# Patient Record
Sex: Female | Born: 1945 | Race: White | Hispanic: No | Marital: Married | Smoking: Never smoker
Health system: Southern US, Community
[De-identification: ages and names within clinical notes are randomized; demographics above are authoritative.]

## PROBLEM LIST (undated history)

## (undated) NOTE — *Deleted (*Deleted)
[facility]  [address] [place], [place] [address] Phone: [phone]  Fax: [phone]   Clinic Day:  04/20/2020  Referring physician: Ronal Fear, NP  Chief Complaint: Mariah Thornton is a 59 y.o. female with metastatic clear cell renal cell carcinoma seen for 1 day assessment.  HPI:  The patient was last seen in the medical oncology clinic on 04/20/2020. At that time, she was groggy after taking hydrocodone/acetaminophen 5/325 every 6 hours. Blood pressure was high (206/166; repeat 181/74).  She has lower extremity edema.  Hematocrit was 30.5, hemoglobin 9.2, MCV 82.9, platelets 142,000, WBC 8,100. Sodium was 129. Potassium was 5.5.  Calcium was 7.8 with an albumin of 2.0 (corrected calcium. AST was 56.  She was referred to the [facility] ER.    CXR revealed pulmonary metastatic disease re-demonstrated with new small pleural effusions since 02/2020.  Bilateral lower extremity duplex revealed no DVT.  Head CT without contrast revealed chronic atrophic and ischemic changes without acute abnormality.  There was stable soft tissue scalp lesion near the vertex on the left.  A smaller nodule was noted in the midline anteriorly.  During the interim, ***   No past medical history on file.  Past Surgical History:  Procedure Laterality Date  . PORTA CATH INSERTION N/A 03/28/2020   Procedure: PORTA CATH INSERTION;  Surgeon: Annice Needy, MD;  Location: [facility] INVASIVE CV LAB;  Service: Cardiovascular;  Laterality: N/A;    No family history on file.  Social History:  reports that she has never smoked. She has never used smokeless tobacco. She reports current alcohol use of about 1.0 standard drink of alcohol per week. She reports previous drug use. reports that she has never smoked. She has never used smokeless tobacco. She reports previous alcohol use. She reports previous drug use.  The patient denies any exposure to radiation or toxins.  She states that she is  retired.  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  Her husband's name is Chanetta Marshall.  The patient lives in [place], [place]. The patient is accompanied by her friend Isabel Caprice*** (in person) today.  Allergies: No Known Allergies  Current Medications: Current Outpatient Medications  Medication Sig Dispense Refill  . axitinib (INLYTA) 5 MG tablet Take 1 tablet (5 mg total) by mouth 2 (two) times daily. 60 tablet 0  . HYDROcodone-acetaminophen (NORCO/VICODIN) 5-325 MG tablet Take 1/2 or 1 pill every 6 hours as needed for pain. 30 tablet 0  . hydrOXYzine (ATARAX/VISTARIL) 25 MG tablet Take 25 mg by mouth every 8 (eight) hours as needed.    . lidocaine-prilocaine (EMLA) cream Apply to affected area once (Patient not taking: Reported on 04/04/2020) 30 g 3  . ondansetron (ZOFRAN) 8 MG tablet Take 1 tablet (8 mg total) by mouth 2 (two) times daily as needed (Nausea or vomiting). (Patient not taking: Reported on 04/04/2020) 30 tablet 1   No current facility-administered medications for this visit.    Review of Systems  Constitutional: Negative for chills, diaphoresis, fever, malaise/fatigue and weight loss (stable).       Feels "groggy" from oxycodone.  HENT: Negative for congestion, ear discharge, ear pain, hearing loss, nosebleeds, sinus pain, sore throat and tinnitus.        Sinus problems.  Eyes: Positive for photophobia. Negative for blurred vision and double vision.       "Squinting all the time."  Respiratory: Negative for cough, hemoptysis, sputum production and shortness of breath.  Cardiovascular: Negative for chest pain, palpitations and leg swelling.  Gastrointestinal: Positive for constipation (mild). Negative for abdominal pain, blood in stool, diarrhea, heartburn, melena, nausea and vomiting.       Drinks Ensure. Poor appetite. Drinks lots of water and tea.  Genitourinary: Negative for dysuria, frequency, hematuria and urgency.  Musculoskeletal: Positive for back pain.  Negative for joint pain, myalgias and neck pain.       Pain in her pubic area.  Skin: Negative for itching and rash.  Neurological: Negative for dizziness, tingling, sensory change, weakness and headaches.  Endo/Heme/Allergies: Does not bruise/bleed easily.  Psychiatric/Behavioral: Negative for depression and memory loss. The patient is not nervous/anxious and does not have insomnia.   All other systems reviewed and are negative.  Performance status (ECOG): 2***  Vitals There were no vitals taken for this visit.  Physical Exam Vitals and nursing note reviewed.  Constitutional:      Comments: Patient sitting in a wheelchair in no acute distress. She was examined in the wheelchair.  HENT:     Head: Normocephalic and atraumatic.     Mouth/Throat:     Mouth: Mucous membranes are moist.     Pharynx: Oropharynx is clear.  Eyes:     General: No scleral icterus.    Extraocular Movements: Extraocular movements intact.     Conjunctiva/sclera: Conjunctivae normal.     Pupils: Pupils are equal, round, and reactive to light.  Neck:     Comments: 7 cm x 4.5 cm mass on base of posterior neck Cardiovascular:     Rate and Rhythm: Normal rate and regular rhythm.     Heart sounds: Normal heart sounds. No murmur heard.   Pulmonary:     Effort: Pulmonary effort is normal. No respiratory distress.     Breath sounds: Normal breath sounds. No wheezing or rales.  Abdominal:     General: Bowel sounds are normal.     Palpations: There is no mass.     Tenderness: There is no abdominal tenderness.  Musculoskeletal:        General: Tenderness (BLE) present. No swelling. Normal range of motion.     Cervical back: Normal range of motion and neck supple.     Right lower leg: Edema present.     Left lower leg: Edema present.  Skin:    General: Skin is warm and dry.  Neurological:     Mental Status: She is alert and oriented to person, place, and time. Mental status is at baseline.  Psychiatric:         Behavior: Behavior normal.        Thought Content: Thought content normal.        Judgment: Judgment normal.      03/16/2020     04/04/2020    Appointment on 04/20/2020  Component Date Value Ref Range Status  . Sodium 04/20/2020 129* 135 - 145 mmol/L Final  . Potassium 04/20/2020 5.5* 3.5 - 5.1 mmol/L Final  . Chloride 04/20/2020 101  98 - 111 mmol/L Final  . CO2 04/20/2020 20* 22 - 32 mmol/L Final  . Glucose, Bld 04/20/2020 137* 70 - 99 mg/dL Final   Glucose reference range applies only to samples taken after fasting for at least 8 hours.  . BUN 04/20/2020 22  8 - 23 mg/dL Final  . Creatinine, Ser 04/20/2020 0.91  0.44 - 1.00 mg/dL Final  . Calcium 40/98/1191 7.8* 8.9 - 10.3 mg/dL Final  . Total Protein 04/20/2020 6.7  6.5 -  8.1 g/dL Final  . Albumin 16/04/9603 2.0* 3.5 - 5.0 g/dL Final  . AST 54/03/8118 56* 15 - 41 U/L Final  . ALT 04/20/2020 12  0 - 44 U/L Final  . Alkaline Phosphatase 04/20/2020 118  38 - 126 U/L Final  . Total Bilirubin 04/20/2020 0.8  0.3 - 1.2 mg/dL Final  . GFR, Estimated 04/20/2020 >60  >60 mL/min Final  . Anion gap 04/20/2020 8  5 - 15 Final   Performed at [facility], [address], [place], [place] [address]  . WBC 04/20/2020 8.1  4.0 - 10.5 K/uL Final  . RBC 04/20/2020 3.68* 3.87 - 5.11 MIL/uL Final  . Hemoglobin 04/20/2020 9.2* 12.0 - 15.0 g/dL Final  . HCT 95/62/1308 30.5* 36 - 46 % Final  . MCV 04/20/2020 82.9  80.0 - 100.0 fL Final  . MCH 04/20/2020 25.0* 26.0 - 34.0 pg Final  . MCHC 04/20/2020 30.2  30.0 - 36.0 g/dL Final  . RDW 65/78/4696 18.6* 11.5 - 15.5 % Final  . Platelets 04/20/2020 142* 150 - 400 K/uL Final  . nRBC 04/20/2020 0.0  0.0 - 0.2 % Final  . Neutrophils Relative % 04/20/2020 70  % Final  . Neutro Abs 04/20/2020 5.7  1.7 - 7.7 K/uL Final  . Lymphocytes Relative 04/20/2020 12  % Final  . Lymphs Abs 04/20/2020 1.0  0.7 - 4.0 K/uL Final  . Monocytes Relative 04/20/2020 11  % Final  . Monocytes Absolute  04/20/2020 0.9  0.1 - 1.0 K/uL Final  . Eosinophils Relative 04/20/2020 3  % Final  . Eosinophils Absolute 04/20/2020 0.3  0.0 - 0.5 K/uL Final  . Basophils Relative 04/20/2020 1  % Final  . Basophils Absolute 04/20/2020 0.0  0.0 - 0.1 K/uL Final  . Immature Granulocytes 04/20/2020 3  % Final  . Abs Immature Granulocytes 04/20/2020 0.21* 0.00 - 0.07 K/uL Final   Performed at [facility], [address] [place], [place] [address]    Assessment:  DANYAH GUASTELLA is a 70 y.o. female with metastatic renal cell carcinoma s/p CT guided biopsy of a 5.6 x 4.0 cm soft tissue mass in the posterior neck on 03/08/2020.  Pathology revealed metastatic carcinoma with clear cell and oncocytic features.  Cytokeratin 7 was negative, cytokeratin 20, focally positive, PAX 8 positive, GATA3 negative and p63 negative.  Based on the pattern of immunoreactivity, metastatic carcinoma is compatible with metastatic renal cell carcinoma.  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 She presented with hypercalcemia.   LDH was 245 on 03/05/2020.  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  There was a possible lytic lesion within the L2 vertebral body.  Abdomen and pelvis CT on 03/04/2020 revealed an 8.8 x 8.5 x 7.5 cm heterogeneous mass arising from the anterior right kidney.  There were numerous and large retroperitoneal lymph nodes.  There are multiple lytic osseous lesions involving the L2 and L3 vertebral bodies with clear cortical and medullary destruction.  There are multiple additional subtle osseous lucencies throughout the spine and pelvis were suspicious for subtle metastatic disease.  There is a small right and trace left pleural effusion and multiple bilateral pulmonary nodules.  There was stigmata of cirrhosis and splenomegaly with trace ascites.   Bone scan on 04/05/2020 revealed foci of abnormal tracer uptake at the inferior RIGHT scapula, L1 and L2 vertebral bodies, and distal  LEFT femoral metaphysis c/w osseous metastases.  Left femur films on 04/07/2020 revealed no suspicious lytic or sclerotic osseous abnormality in the metaphysis of the distal left femur .  She is day 15 of cycle #1 pembrolizumab (began 04/07/2020).  She began axitinib on 04/15/2020.  She has a history of hypercalcemia.   She received Zometa on 03/04/2020 and 04/04/2020.  Echo on 04/06/2020 revealed an EF of 70-75%.  She has folate deficiency.  Folate was 4.8 on 03/06/2020.  She is on oral folic acid.  B12 was 2351 on 03/06/2020.  She received the Pfizer COVID-19 vaccine on 11/11/2019 and 12/08/2019.  Symptomatically, ***  Plan: 1.Labs today:  ***   2.  Metastatic clear cell renal cell carcinoma She has a large right renal mass, multiple lytic bone lesions, and a mass on her posterior neck.             She is in a poor prognostic group based on [facility] criteria (metastatic disease, KPS < 80%, hypercalcemia,elevated LDH,and anemia). She is day 1 of axitinib (Inlyta) and day 14 pembrolizumab.                         Blood pressure is elevated and likely secondary to axitinib.                                     Hold axitinib.                         She has hyperkalemia                                     Etiology likely secondary to axitinib.             Clinically, her tumor is smaller. 3. Hypercalcemia Calcium is 7.8 (corrected 9.5).             She has had hypercalcemia associated with malignancy. She receives Zometa monthly (last 04/04/2020). 4. Normocytic anemia Hematocrit 30.5.  Hemoglobin 9.2.  MCV 82.9.             Etiology likely secondary to renal cell carcinoma. Patient denies any bleeding. Ferritin 660 with an iron saturation of 11% and a TIBC of 150.  Folate was 4.8 (low) with a B12 of 2351.                         She is on folic acid.             Continue to monitor. 5. Cancer-related pain             She has back pain and pain in the pubic bone.                         Lumbar spine MRI is scheduled for 04/22/2020.             Pain is well managed with hydrocodone/acetamonophen, but makes her extremely drowsy.                         She has used 1 tablet instead of 1/2 tablet.  Discontinue hydrocodone/acetamonophen.             Discuss trial of Tramadol 1/2 tablet (25 mg) every 6 hours prn pain.                         Rx sent in today.             She may be a candidate for future local radiation. 6.   Hypertension             Initial BP 206/166; repeat 181/74.             Etiology secondary to axitinib.             Discuss management in the ER. 7.   Bilateral lower extremity edema             Discuss bilateral lower extremity duplex to r/o DVT.             Consider Lasix for diuresis. 8.  Hyperkalemia             Patient not on supplemental potassium.             Axitinib can cause hyperkalemia (15% incidence).             Consider Lasix as above. 9.   Patient to [facility] ER for hypertension, lower extremity edema and hyperkalemia. 10.   RTC tomorrow for MD assessment and labs (BMP).  I discussed the assessment and treatment plan with the patient.  The patient was provided an opportunity to ask questions and all were answered.  The patient agreed with the plan and demonstrated an understanding of the instructions.  The patient was advised to call back if the symptoms worsen or if the condition fails to improve as anticipated.  I provided *** minutes of face-to-face time during this this encounter and > 50% was spent counseling as documented under my assessment and plan.  Melissa C. Merlene Pulling, MD, PhD    04/20/2020, 5:01 PM  I, Danella Penton Tufford, am acting as Neurosurgeon for [employer]. Merlene Pulling, MD,  PhD.  I, Melissa C. Merlene Pulling, MD, have reviewed the above documentation for accuracy and completeness, and I agree with the above.

---

## 2019-11-11 ENCOUNTER — Ambulatory Visit: Payer: Self-pay | Attending: Internal Medicine

## 2019-11-11 DIAGNOSIS — Z23 Encounter for immunization: Secondary | ICD-10-CM

## 2019-11-11 NOTE — Progress Notes (Signed)
   Covid-19 Vaccination Clinic  Name:  Mariah Thornton    MRN: [id number] DOB: Jan 26, 1946  11/11/2019  [name] was observed post Covid-19 immunization for 15 minutes without incident. She was provided with Vaccine Information Sheet and instruction to access the V-Safe system.   [name] was instructed to call 911 with any severe reactions post vaccine: Marland Kitchen Difficulty breathing  . Swelling of face and throat  . A fast heartbeat  . A bad rash all over body  . Dizziness and weakness   Immunizations Administered    Name Date Dose VIS Date Route   Pfizer COVID-19 Vaccine 11/11/2019 10:10 AM 0.3 mL 09/02/2018 Intramuscular   Manufacturer: [place]   Lot: [id number]   [place]: [id number]

## 2019-12-08 ENCOUNTER — Ambulatory Visit: Payer: Medicare Other | Attending: Internal Medicine

## 2019-12-08 DIAGNOSIS — Z23 Encounter for immunization: Secondary | ICD-10-CM

## 2019-12-08 NOTE — Progress Notes (Signed)
   Covid-19 Vaccination Clinic  Name:  Mariah Thornton    MRN: [id number] DOB: Aug 01, 1945  12/08/2019  [name] was observed post Covid-19 immunization for 15 minutes without incident. She was provided with Vaccine Information Sheet and instruction to access the V-Safe system.   [name] was instructed to call 911 with any severe reactions post vaccine: Marland Kitchen Difficulty breathing  . Swelling of face and throat  . A fast heartbeat  . A bad rash all over body  . Dizziness and weakness   Immunizations Administered    Name Date Dose VIS Date Route   Pfizer COVID-19 Vaccine 12/08/2019  9:54 AM 0.3 mL 09/02/2018 Intramuscular   Manufacturer: [place]   Lot: [id number]   [place]: [id number]

## 2020-02-08 ENCOUNTER — Other Ambulatory Visit: Payer: Self-pay | Admitting: Urgent Care

## 2020-02-08 DIAGNOSIS — R221 Localized swelling, mass and lump, neck: Secondary | ICD-10-CM

## 2020-02-19 ENCOUNTER — Other Ambulatory Visit: Payer: Self-pay

## 2020-02-19 ENCOUNTER — Ambulatory Visit
Admission: RE | Admit: 2020-02-19 | Discharge: 2020-02-19 | Disposition: A | Discharge status: Home / Self Care | Payer: Medicare Other | Source: Ambulatory Visit | Attending: Urgent Care | Admitting: Urgent Care

## 2020-02-19 DIAGNOSIS — R221 Localized swelling, mass and lump, neck: Secondary | ICD-10-CM

## 2020-02-19 IMAGING — US US SOFT TISSUE HEAD/NECK
1 series · 14 of 22 positions shown · non-contrast
Comparison: None.

CLINICAL DATA: 74-year-old female with a history of neck lump

EXAM:
ULTRASOUND OF HEAD/NECK SOFT TISSUES
TECHNIQUE: Ultrasound examination of the head and neck soft tissues was
performed in the area of clinical concern.

[Series 1: us soft tissue head/neck · 0.08mm/px · 14 of 22 slices shown]
[im 1/22]
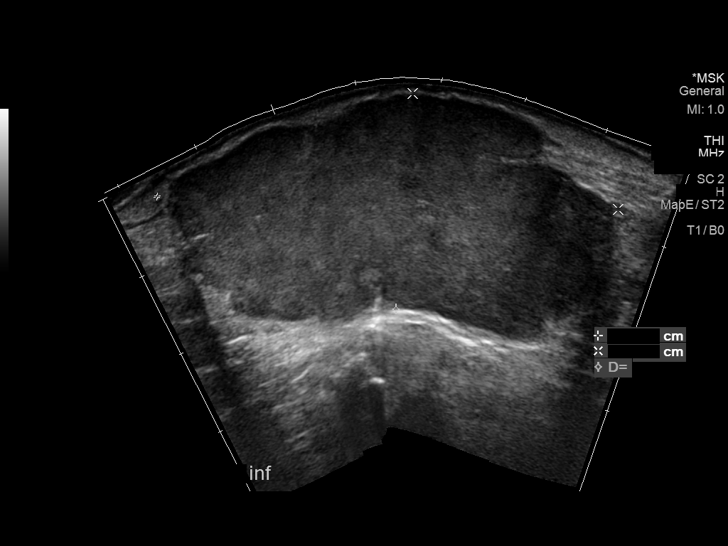
[im 3/22]
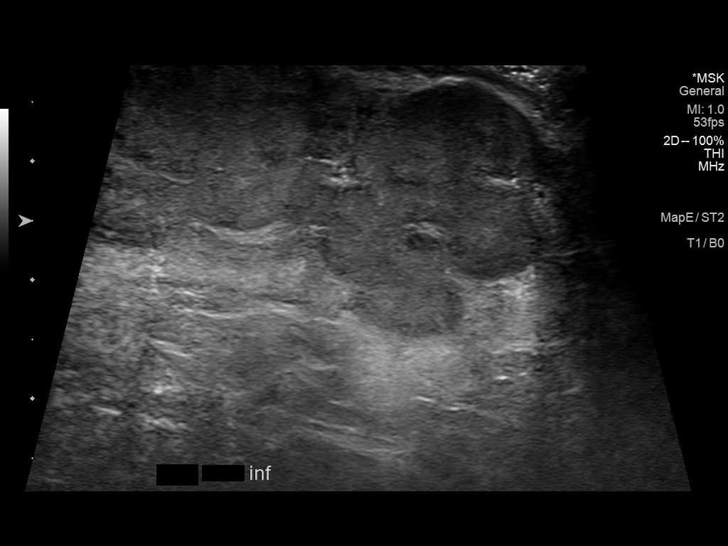
[im 4/22]
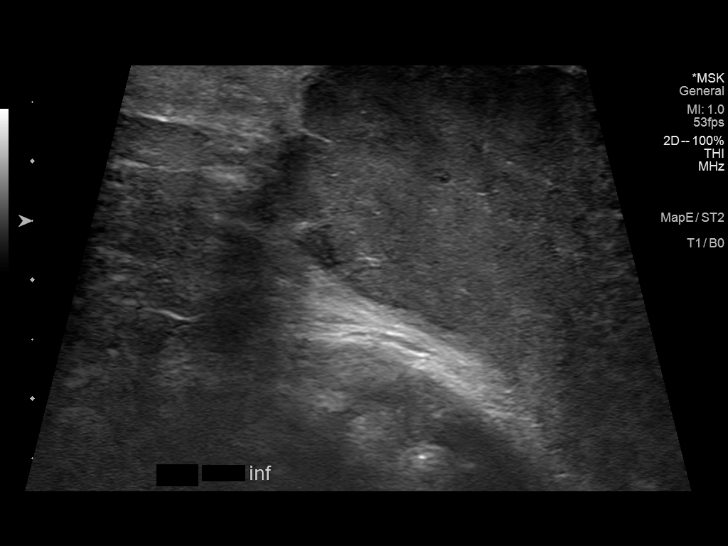
[im 6/22]
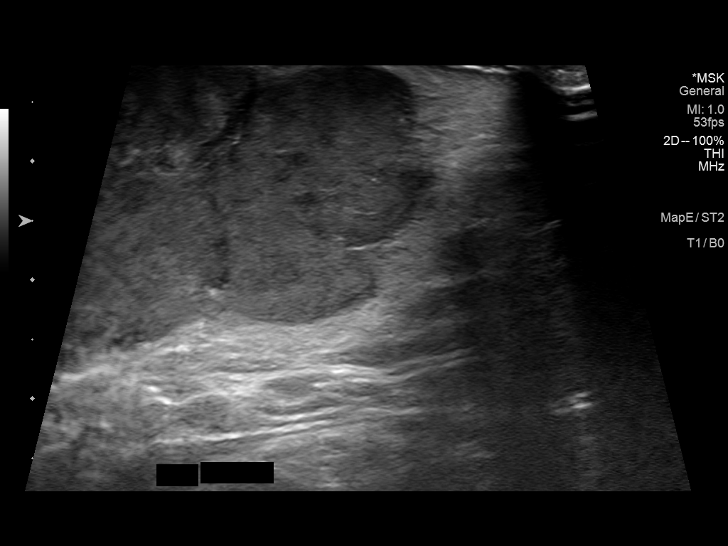
[im 8/22]
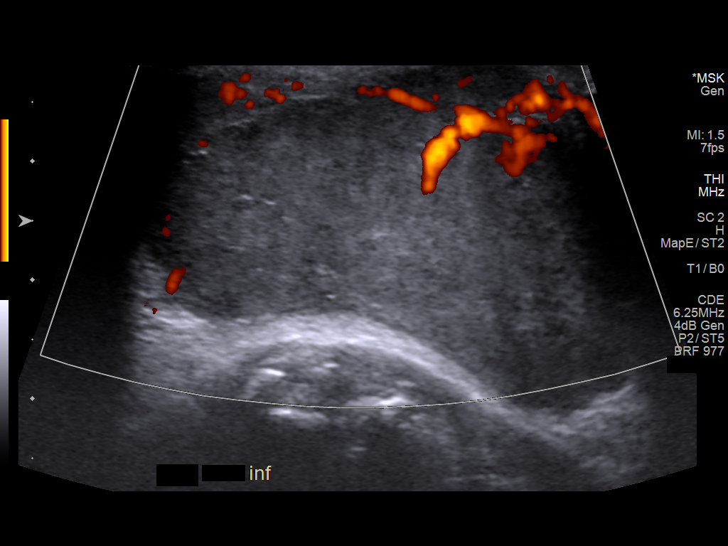
[im 9/22]
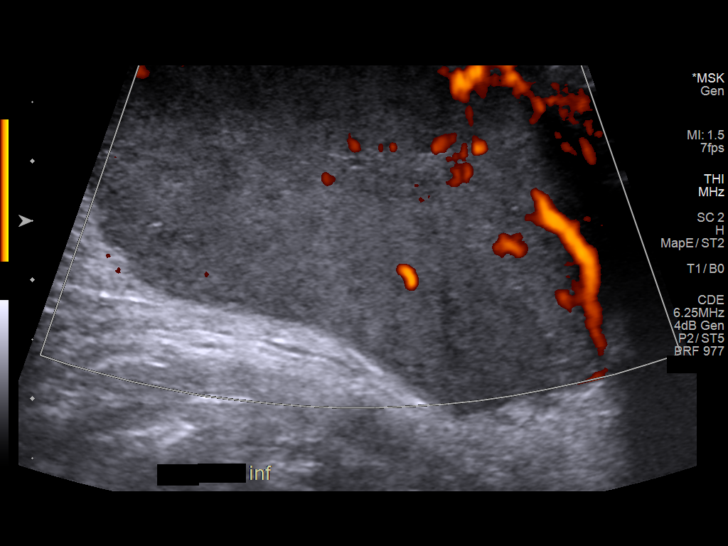
[im 11/22]
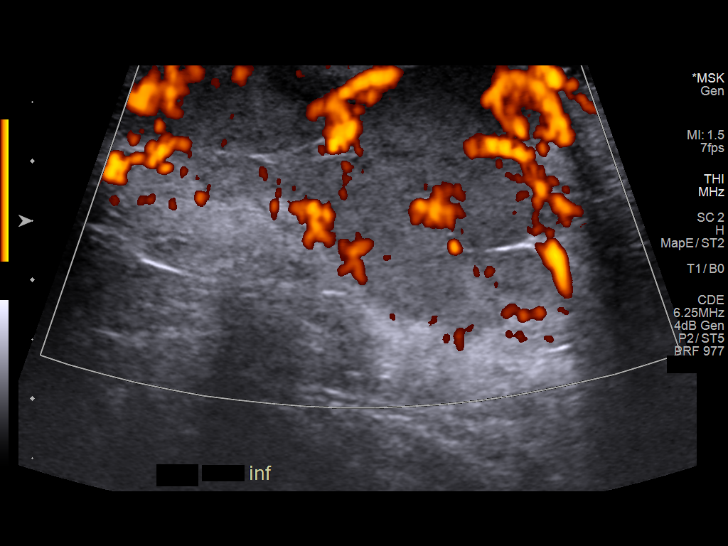
[im 12/22]
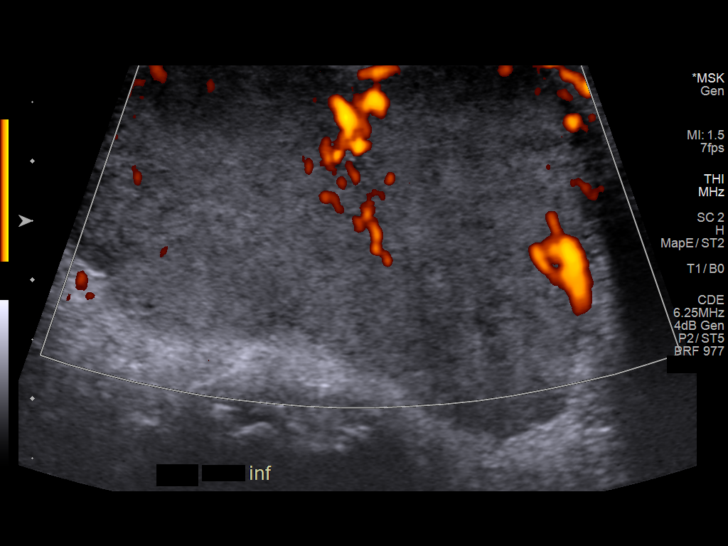
[im 14/22]
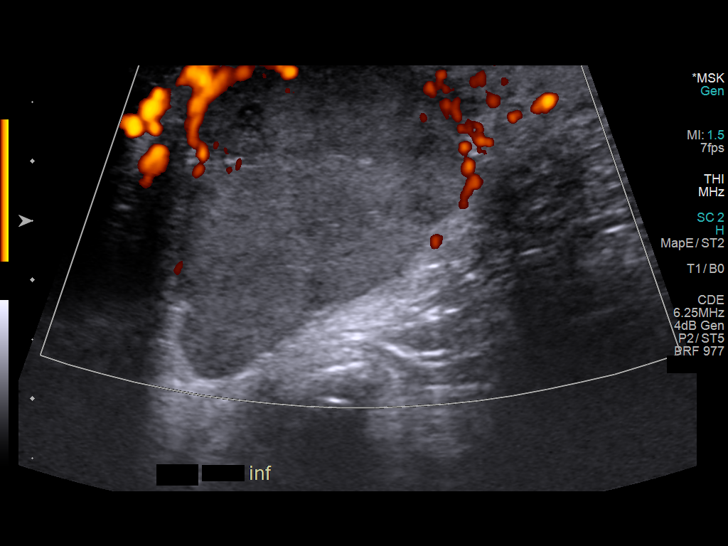
[im 15/22]
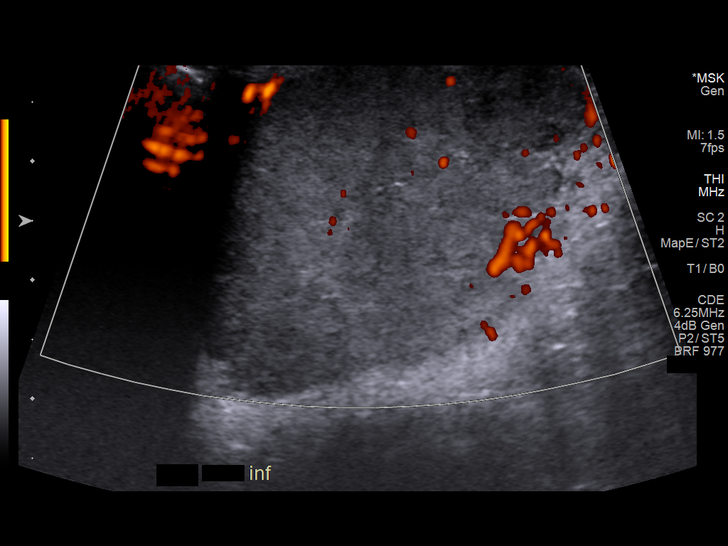
[im 17/22]
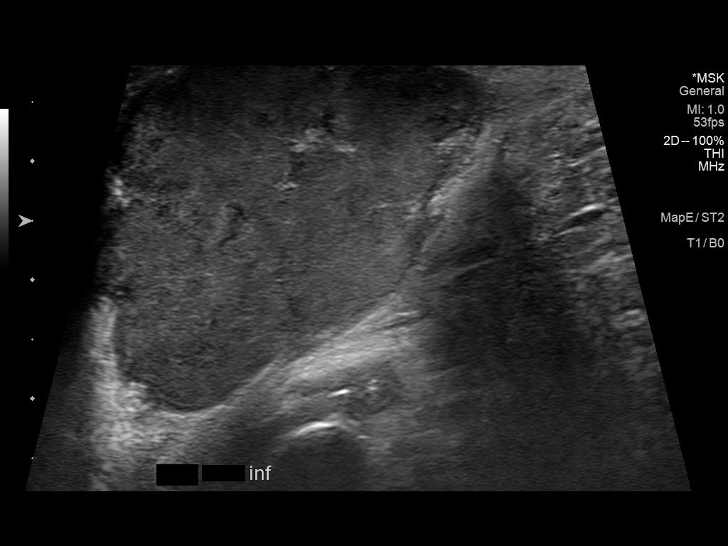
[im 19/22]
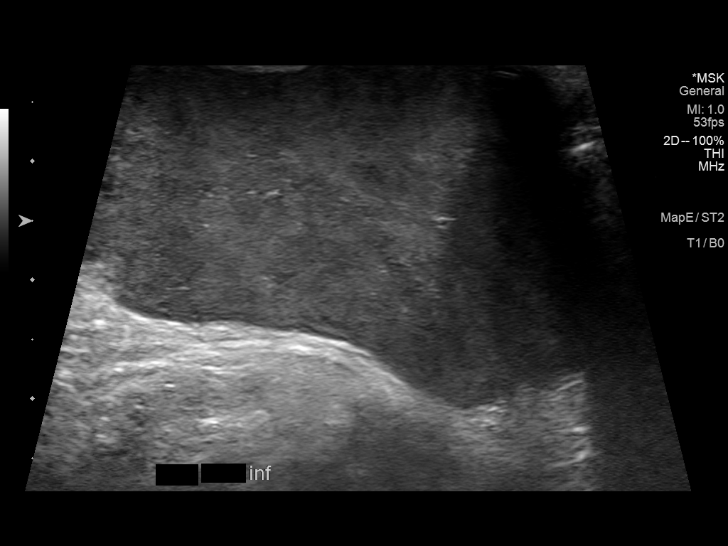
[im 20/22]
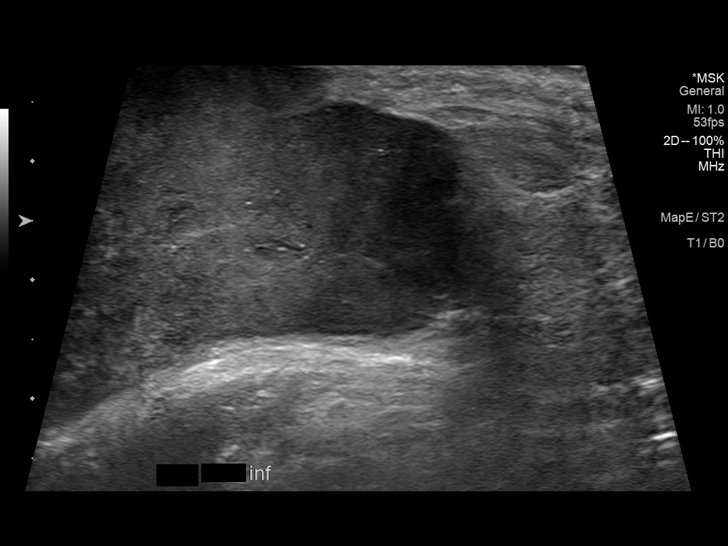
[im 22/22]
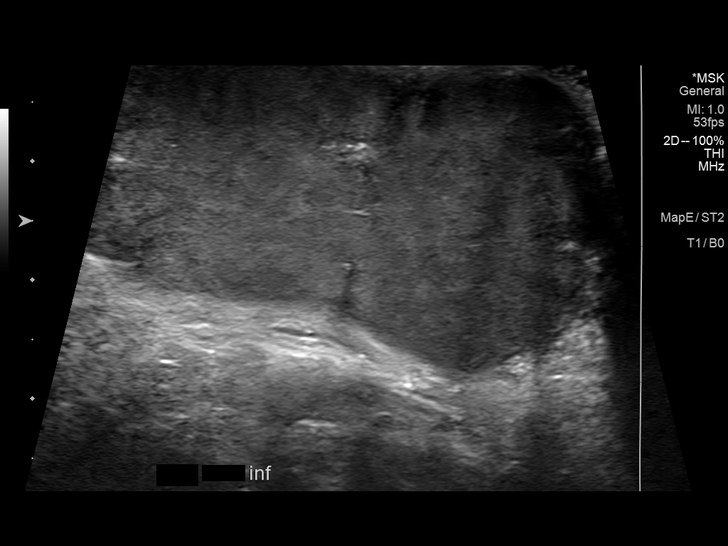

[14 of 22 positions shown; findings below may reference images not displayed]

FINDINGS: Grayscale and color duplex performed in the region clinical concern.

There is a lobulated relatively homogeneously hypoechoic soft tissue
mass in the posterior low neck measuring 5.3 cm x 2.7 cm x 3.4 cm.
Internal blood flow documented. The margin is well-defined,
overlying the musculature.

No adenopathy.
IMPRESSION: Soft tissue mass in the posterior neck measures 5.3 cm. Liposarcoma
or other malignancy not excluded and further evaluation with
contrast-enhanced neck CT is recommended, as well as referral for
surgical evaluation.

## 2020-02-23 ENCOUNTER — Other Ambulatory Visit: Payer: Self-pay | Admitting: Urgent Care

## 2020-02-23 DIAGNOSIS — R221 Localized swelling, mass and lump, neck: Secondary | ICD-10-CM

## 2020-02-26 ENCOUNTER — Emergency Department
Admission: EM | Admit: 2020-02-26 | Discharge: 2020-02-27 | Disposition: A | Discharge status: Home / Self Care | Payer: Medicare Other | Attending: Emergency Medicine | Admitting: Emergency Medicine

## 2020-02-26 ENCOUNTER — Emergency Department: Payer: Medicare Other

## 2020-02-26 ENCOUNTER — Encounter: Payer: Self-pay | Admitting: Emergency Medicine

## 2020-02-26 ENCOUNTER — Other Ambulatory Visit: Payer: Self-pay

## 2020-02-26 DIAGNOSIS — J181 Lobar pneumonia, unspecified organism: Secondary | ICD-10-CM | POA: Diagnosis not present

## 2020-02-26 DIAGNOSIS — R911 Solitary pulmonary nodule: Secondary | ICD-10-CM

## 2020-02-26 DIAGNOSIS — R05 Cough: Secondary | ICD-10-CM | POA: Diagnosis present

## 2020-02-26 DIAGNOSIS — J189 Pneumonia, unspecified organism: Secondary | ICD-10-CM

## 2020-02-26 LAB — BASIC METABOLIC PANEL
Anion gap: 9 (ref 5–15)
BUN: 18 mg/dL (ref 8–23)
CO2: 22 mmol/L (ref 22–32)
Calcium: 11.5 mg/dL — ABNORMAL HIGH (ref 8.9–10.3)
Chloride: 103 mmol/L (ref 98–111)
Creatinine, Ser: 1.04 mg/dL — ABNORMAL HIGH (ref 0.44–1.00)
GFR calc Af Amer: >60 mL/min (ref >60)
GFR calc non Af Amer: 53 mL/min — ABNORMAL LOW (ref >60)
Glucose, Bld: 106 mg/dL — ABNORMAL HIGH (ref 70–99)
Potassium: 4.2 mmol/L (ref 3.5–5.1)
Sodium: 134 mmol/L — ABNORMAL LOW (ref 135–145)

## 2020-02-26 LAB — URINALYSIS, COMPLETE (UACMP) WITH MICROSCOPIC
Bilirubin Urine: NEGATIVE
Glucose, UA: NEGATIVE mg/dL
Ketones, ur: NEGATIVE mg/dL
Leukocytes,Ua: NEGATIVE
Nitrite: NEGATIVE
Protein, ur: 30 mg/dL — AB
Specific Gravity, Urine: 1.015 (ref 1.005–1.030)
pH: 5 (ref 5.0–8.0)

## 2020-02-26 LAB — CBC
HCT: 29 % — ABNORMAL LOW (ref 36.0–46.0)
Hemoglobin: 8.6 g/dL — ABNORMAL LOW (ref 12.0–15.0)
MCH: 26 pg (ref 26.0–34.0)
MCHC: 29.7 g/dL — ABNORMAL LOW (ref 30.0–36.0)
MCV: 87.6 fL (ref 80.0–100.0)
Platelets: 176 10*3/uL (ref 150–400)
RBC: 3.31 MIL/uL — ABNORMAL LOW (ref 3.87–5.11)
RDW: 15.8 % — ABNORMAL HIGH (ref 11.5–15.5)
WBC: 6.6 10*3/uL (ref 4.0–10.5)
nRBC: 0 % (ref 0.0–0.2)

## 2020-02-26 IMAGING — CR DG CHEST 2V
2 series · 2 of 2 positions shown · non-contrast
Comparison: None.

CLINICAL DATA: Weakness

EXAM:
CHEST - 2 VIEW

[chest pa]
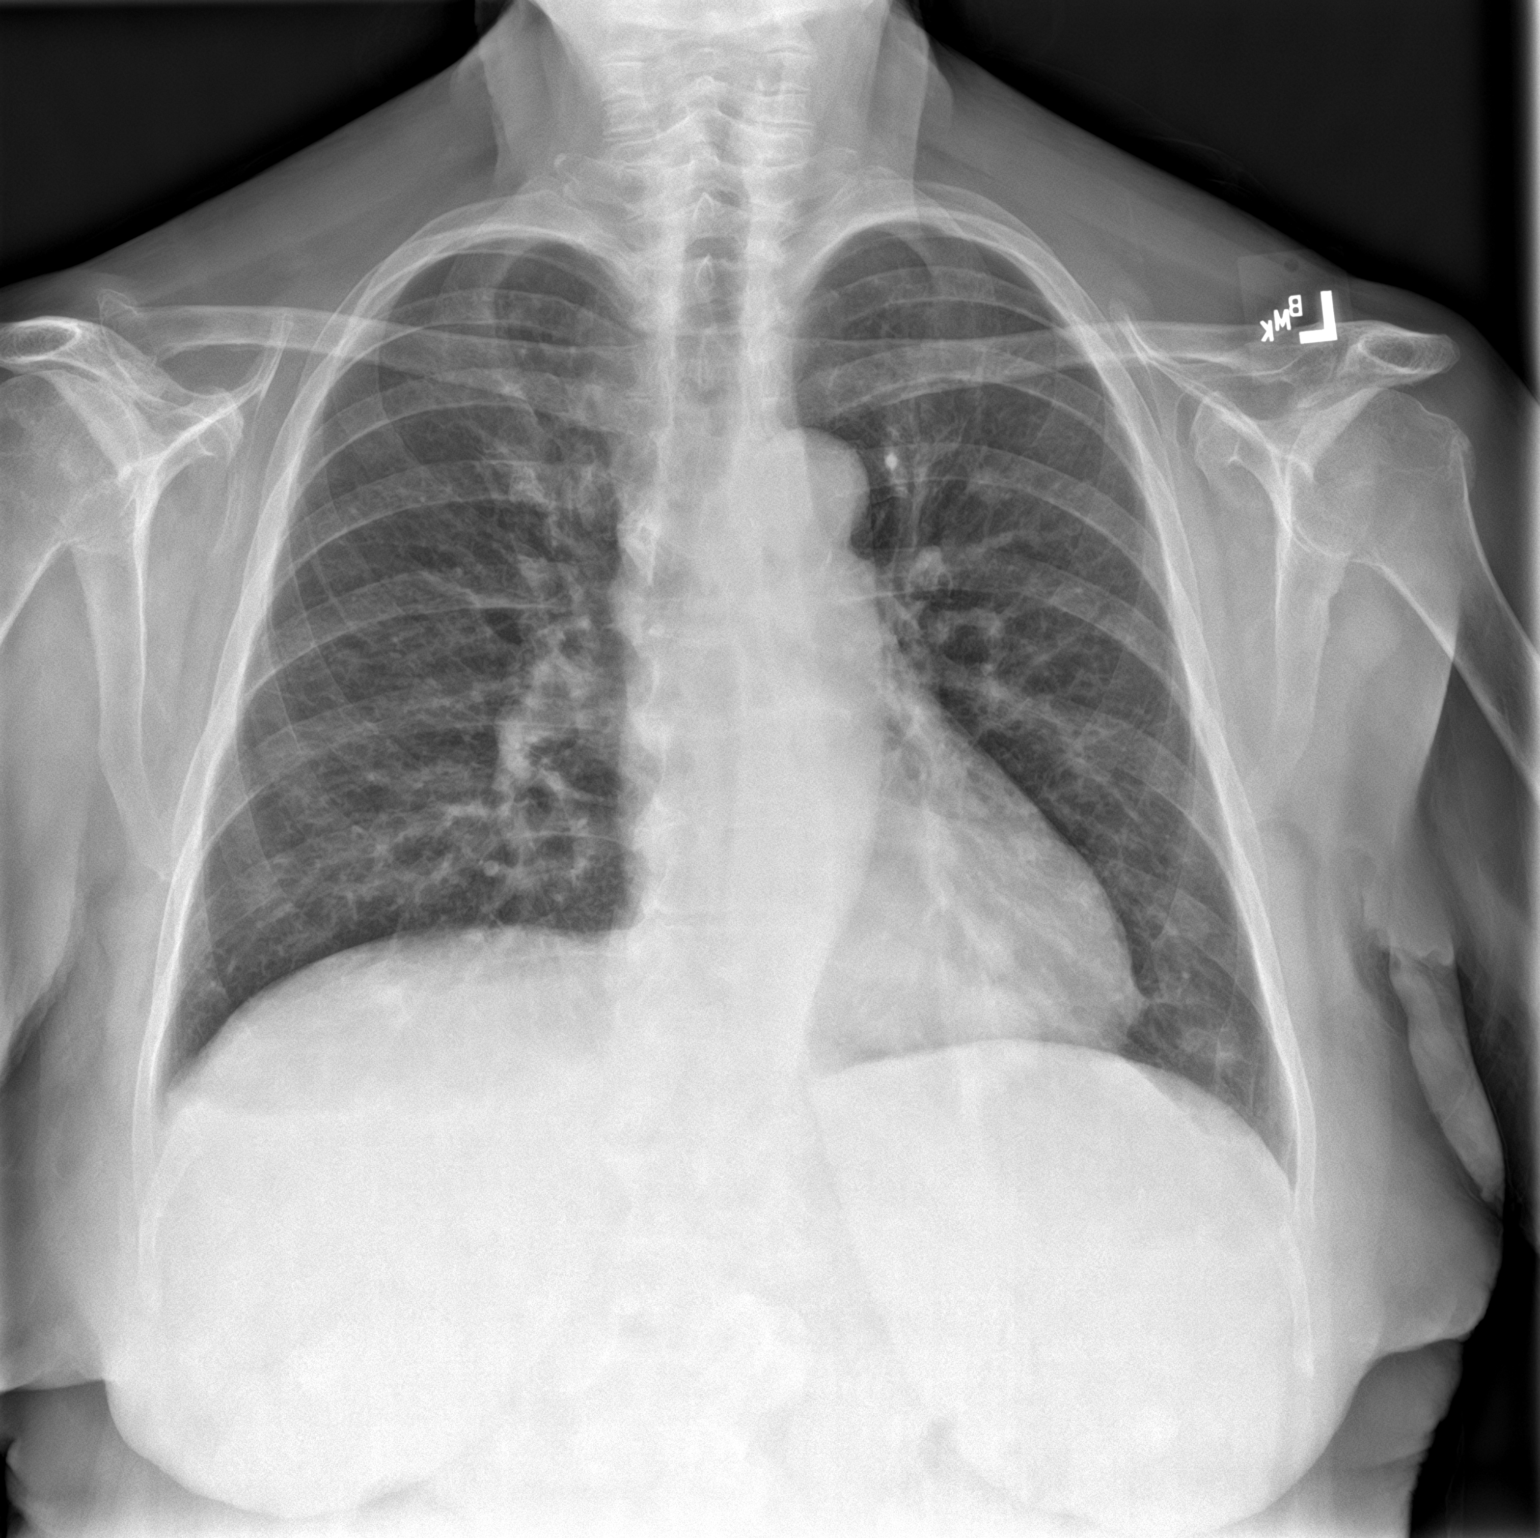

[chest lat]
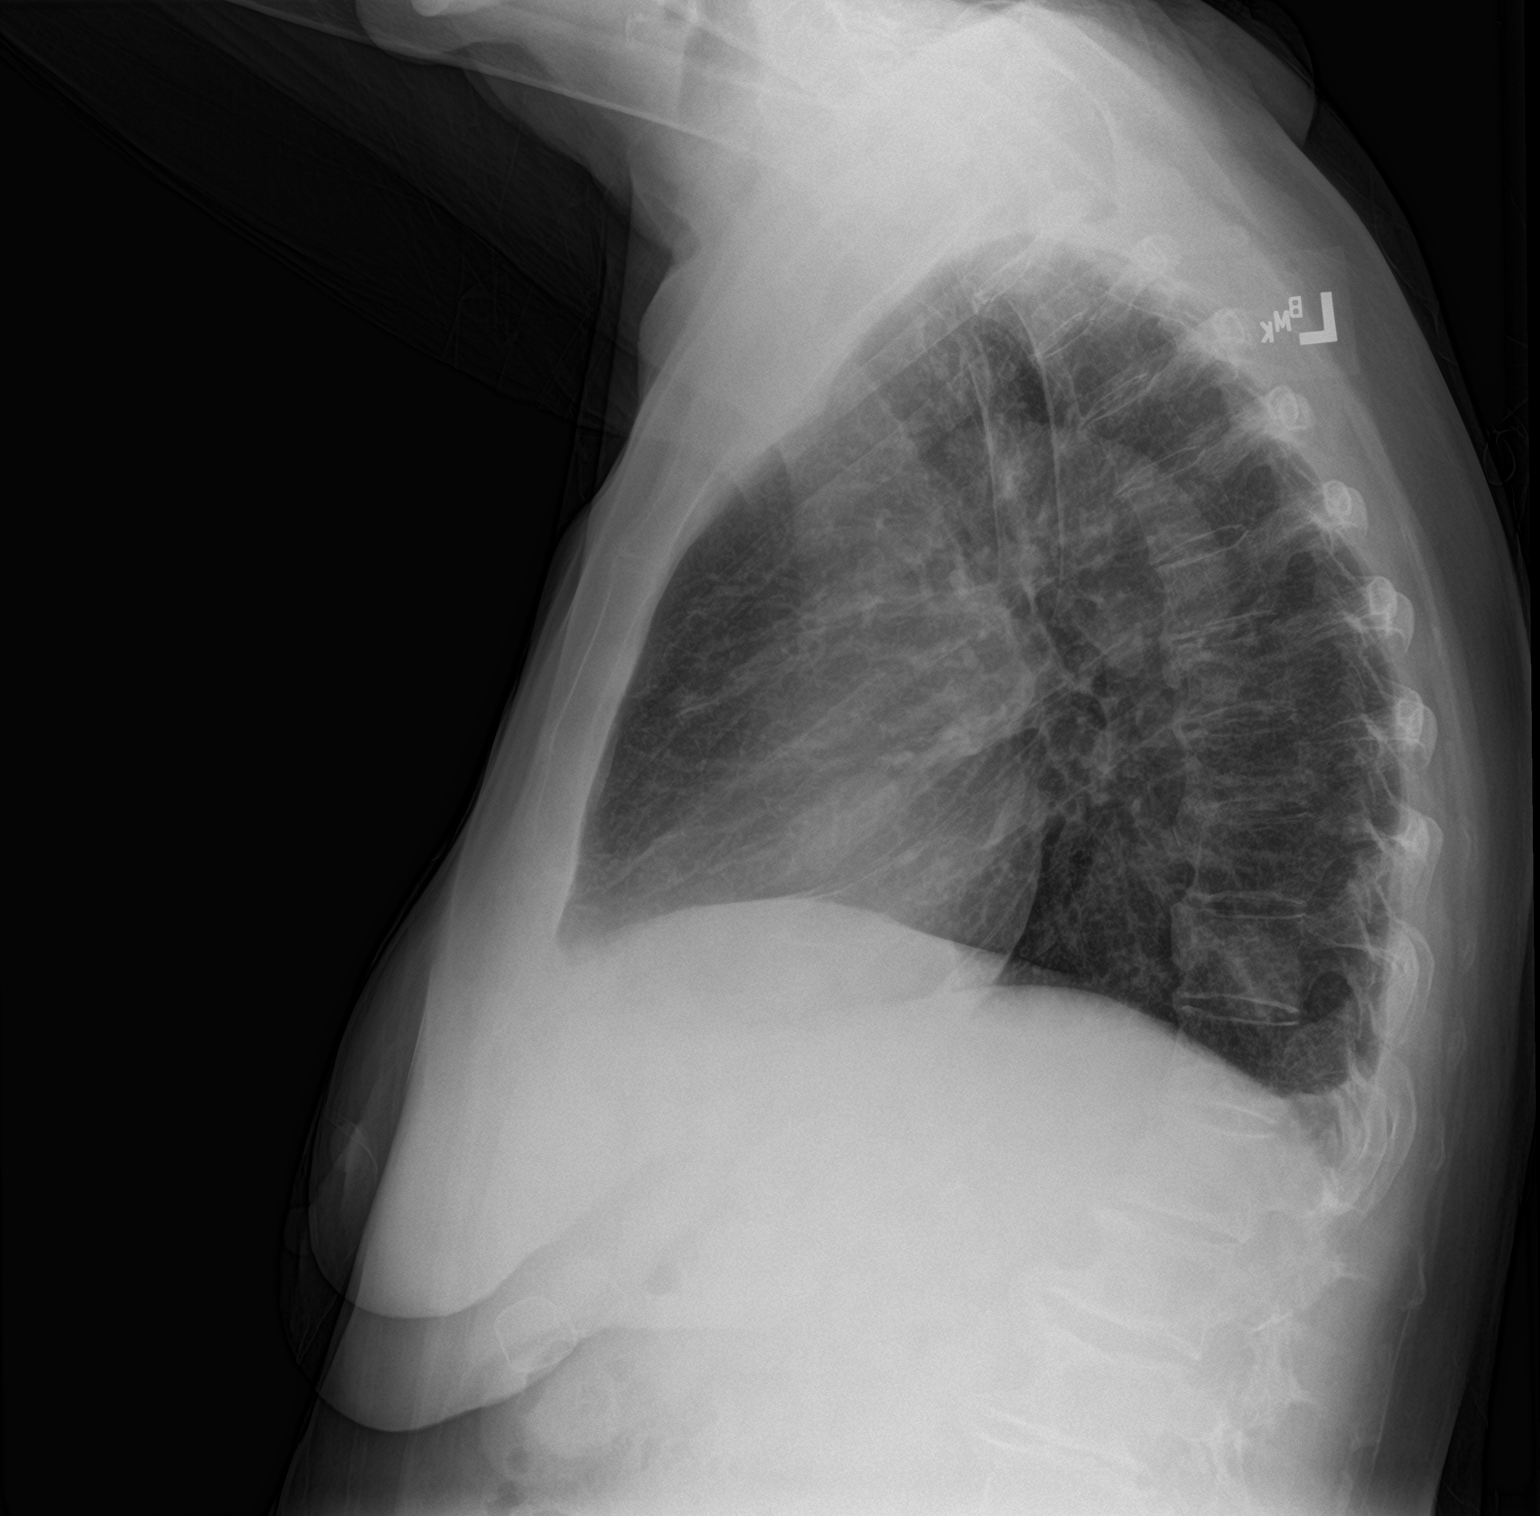

[2 of 2 positions shown; findings below may reference images not displayed]

FINDINGS: The lungs are symmetrically expanded. There is focal lingular
infiltrate, possibly infectious in the appropriate clinical setting.
11 mm nodule is seen within the a left upper lobe, indeterminate. No
pneumothorax. Tiny right pleural effusion is present. Cardiac size
within normal limits. Pulmonary vascularity is normal. No acute bone
abnormality.
IMPRESSION: 1. Lingular infiltrate, possibly infectious in the appropriate
clinical setting.
2. Tiny right pleural effusion.
3. 11 mm nodule in the left upper lobe, indeterminate. Follow-up
chest radiograph is recommended in 6 weeks, once the patient's acute
issues have resolved. If persistent, this could be further assessed
with dedicated CT imaging.

## 2020-02-26 IMAGING — CT CT HEAD W/O CM
3 series · 15 of 46 positions shown, 18 images · non-contrast
Comparison: None.

CLINICAL DATA: Weakness, altered level of consciousness

EXAM:
CT HEAD WITHOUT CONTRAST
TECHNIQUE: Contiguous axial images were obtained from the base of the skull
through the vertex without intravenous contrast.

[Series 2: head wo · axial · 0.41mm/px · z∈[-119,+1]mm · 9 of 29 slices shown, 12 images]
[im 3/29  brain]
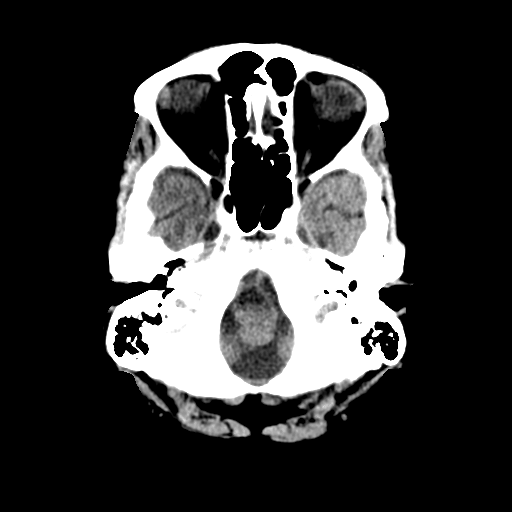
[im 3/29  bone]
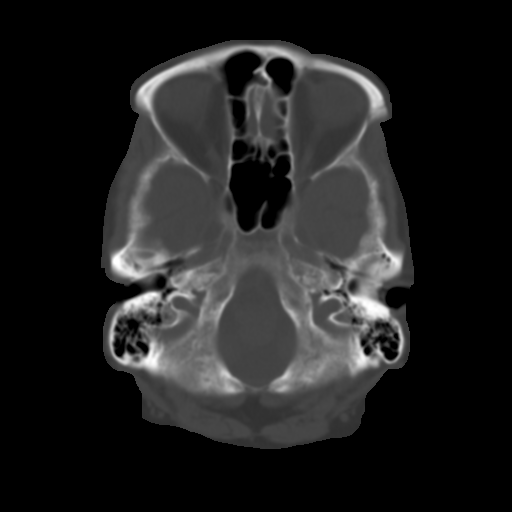
[im 6/29  brain]
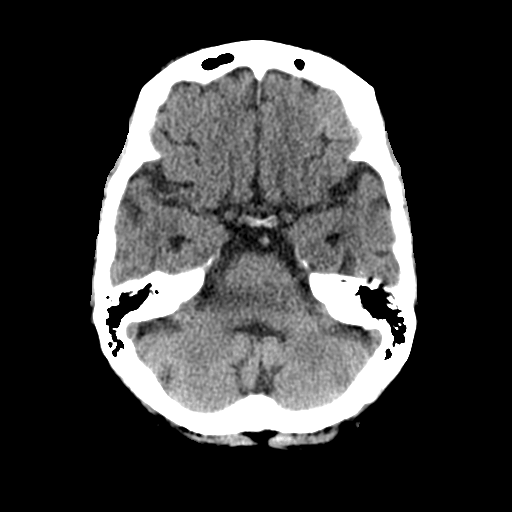
[im 9/29  brain]
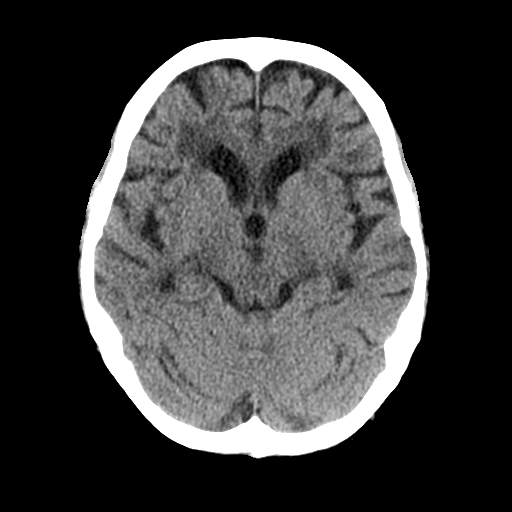
[im 12/29  brain]
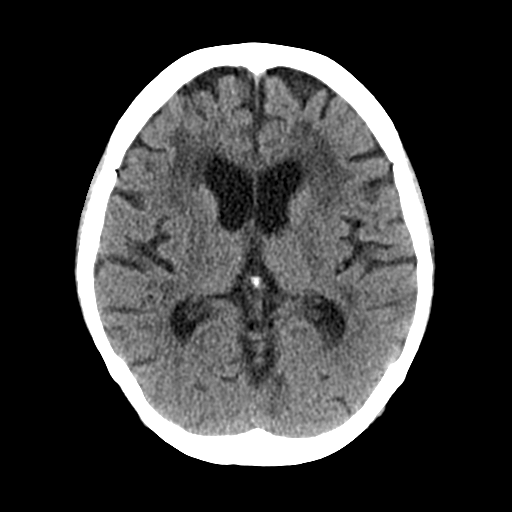
[im 15/29  brain]
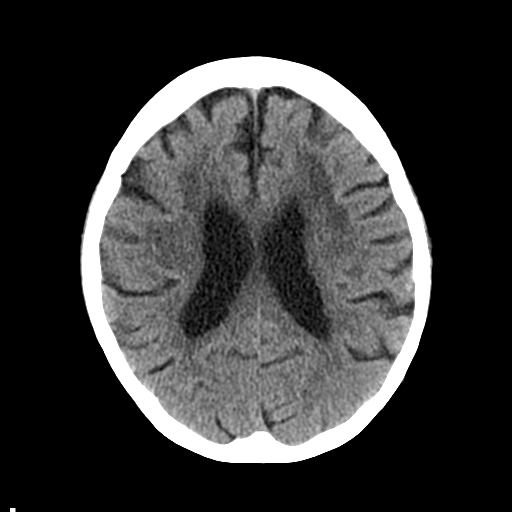
[im 15/29  bone]
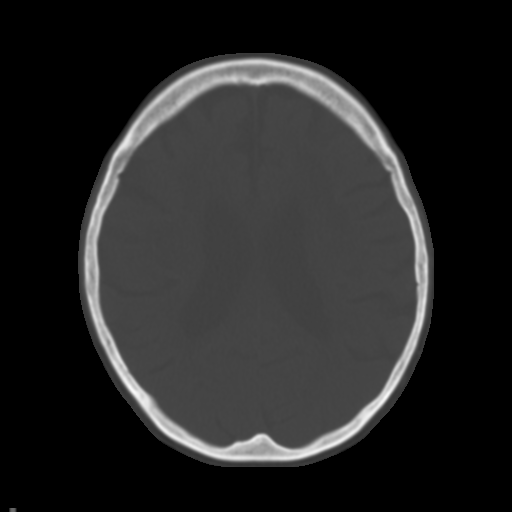
[im 18/29  brain]
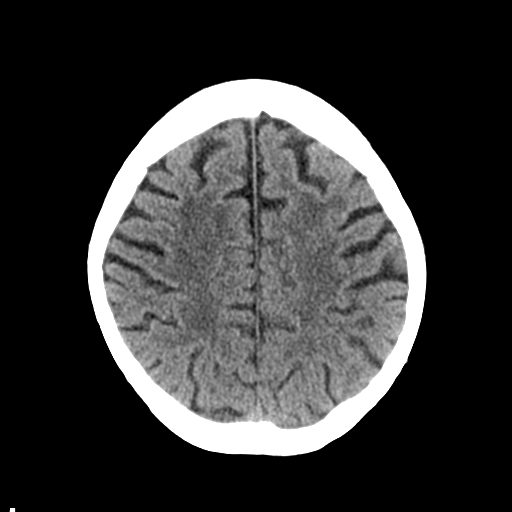
[im 21/29  brain]
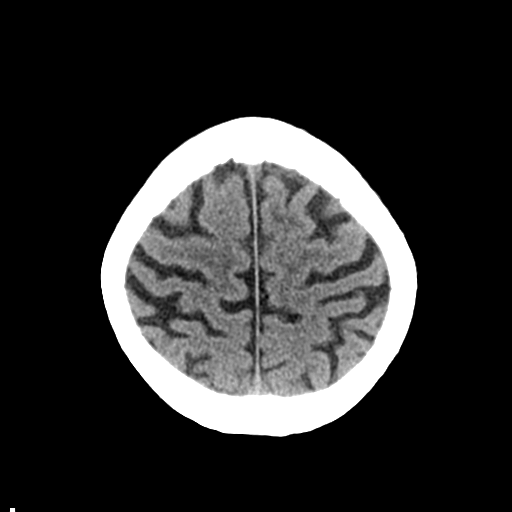
[im 24/29  brain]
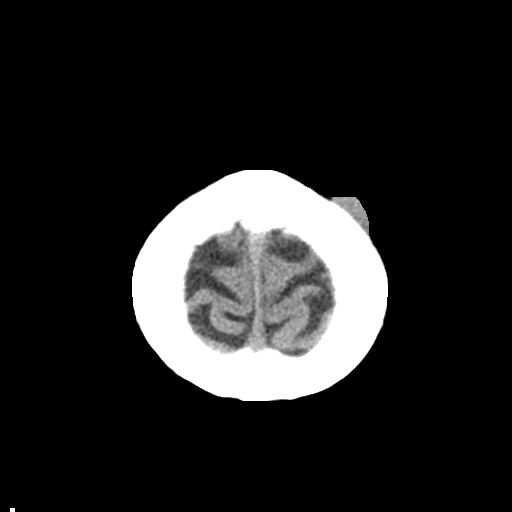
[im 27/29  brain]
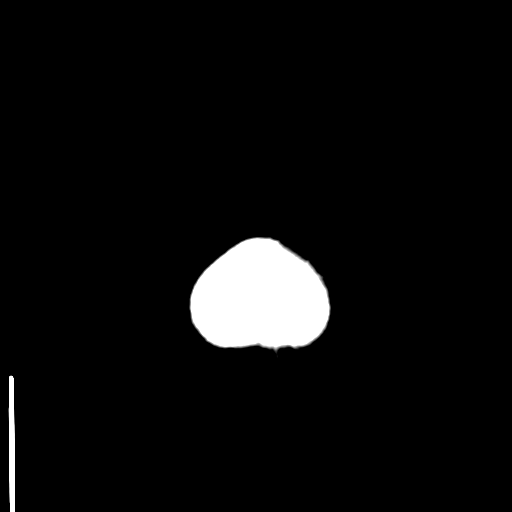
[im 27/29  bone]
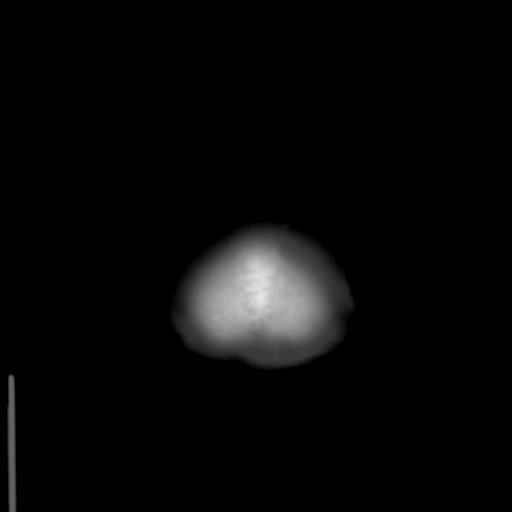

[Series 4: coronal soft tissue · coronal · 0.28mm/px · 3 of 60 slices shown]
[im 20/60  brain]
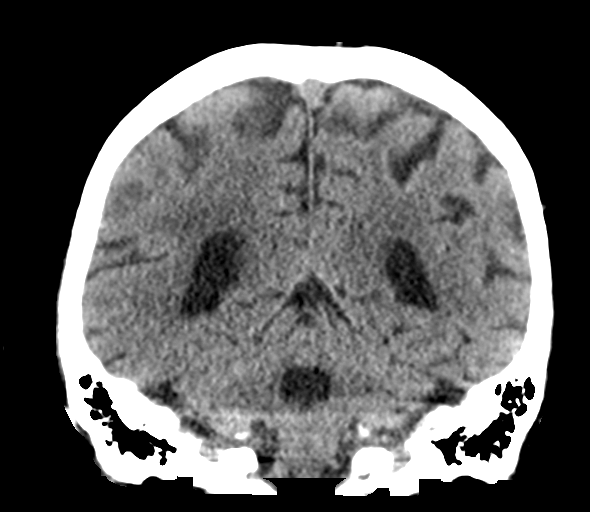
[im 27/60  brain]
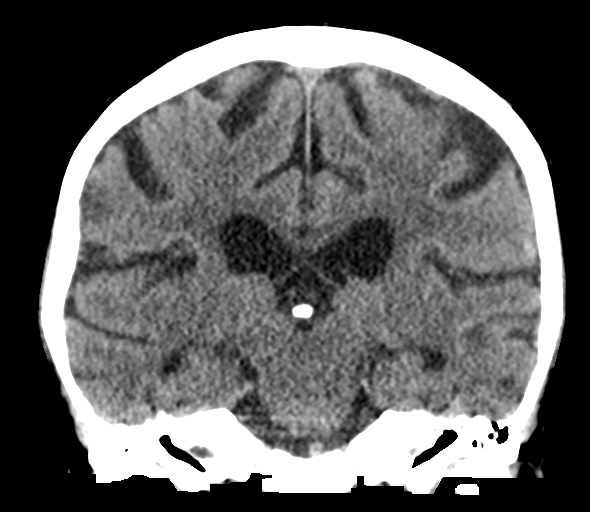
[im 33/60  brain]
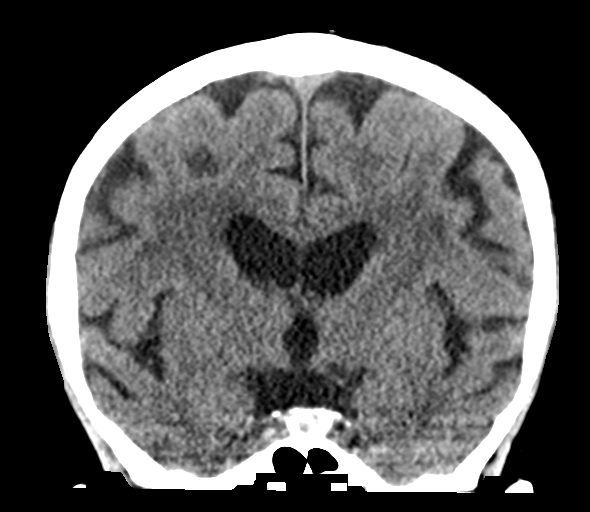

[Series 5: sagittal soft tissue · sagittal · 0.29mm/px · 3 of 52 slices shown]
[im 18/52  brain]
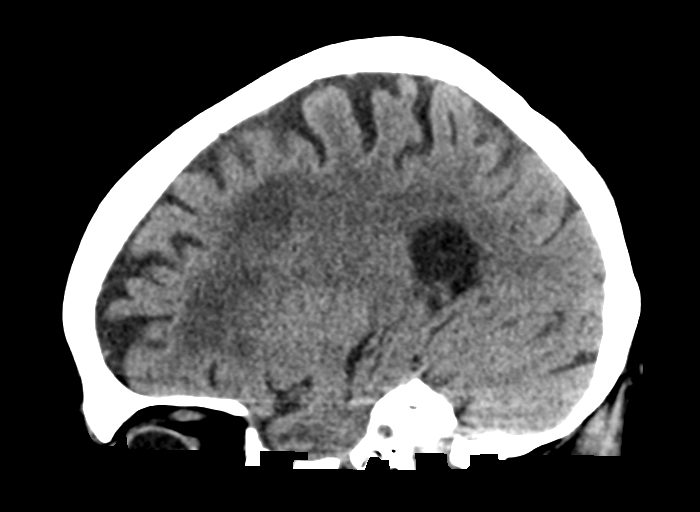
[im 26/52  brain]
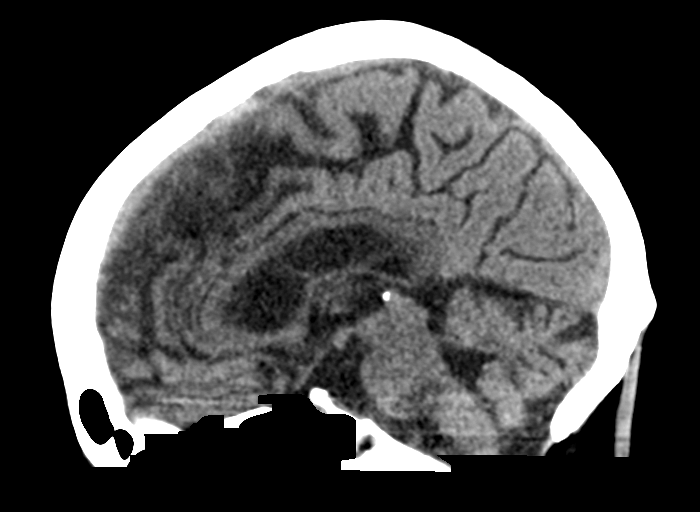
[im 35/52  brain]
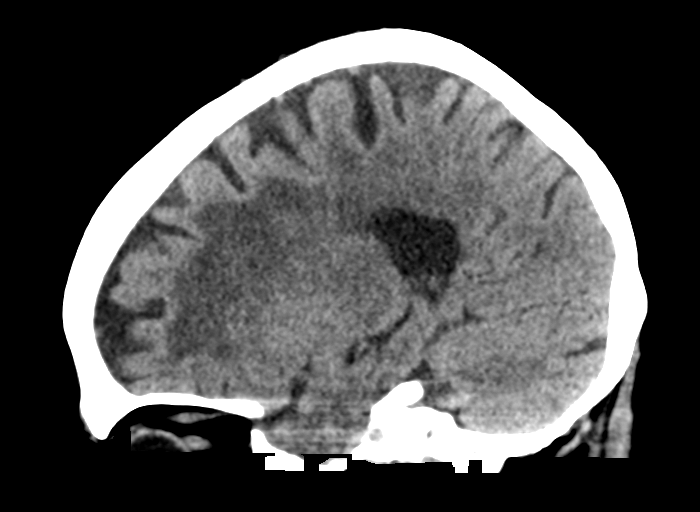

[15 of 46 positions shown; findings below may reference images not displayed]

FINDINGS: Brain: Confluent hypodensities within the periventricular white
matter are most consistent with chronic small vessel ischemic
changes. No signs of acute infarct or hemorrhage. Lateral ventricles
and remaining midline structures are unremarkable. No acute
extra-axial fluid collections. No mass effect.

Vascular: No hyperdense vessel or unexpected calcification.

Skull: Multiple nonspecific soft tissue nodules are seen within the
scalp, largest along the left frontal convexity measuring 2.9 cm.
Etiology is indeterminate, correlation with physical exam is
recommended. Underlying calvarium is unremarkable. No acute
fracture.

Sinuses/Orbits: No acute finding.

Other: None.
IMPRESSION: 1. Chronic small vessel ischemic change throughout the white matter.
No acute intracranial process.
2. Multiple soft tissue nodules within the scalp, largest measuring
2.9 cm along the left frontal convexity. Etiology indeterminate.
Please correlate with physical exam findings.

## 2020-02-26 MED ORDER — SODIUM CHLORIDE 0.9 % IV BOLUS
1000.0000 mL | Freq: Once | INTRAVENOUS | Status: AC
  Administered 2020-02-26: 1000 mL via INTRAVENOUS

## 2020-02-26 MED ORDER — SODIUM CHLORIDE 0.9 % IV SOLN
2.0000 g | Freq: Once | INTRAVENOUS | Status: AC
  Administered 2020-02-26: 2 g via INTRAVENOUS
  Filled 2020-02-26: qty 20

## 2020-02-26 MED ORDER — AMOXICILLIN-POT CLAVULANATE 875-125 MG PO TABS: 1.0000 | ORAL_TABLET | Freq: Two times a day (BID) | ORAL | 0 refills | Status: DC

## 2020-02-26 MED ORDER — AZITHROMYCIN 500 MG PO TABS
500.0000 mg | ORAL_TABLET | Freq: Once | ORAL | Status: AC
  Administered 2020-02-26: 500 mg via ORAL
  Filled 2020-02-26: qty 1

## 2020-02-26 MED ORDER — AZITHROMYCIN 250 MG PO TABS: 250.0000 mg | ORAL_TABLET | Freq: Every day | ORAL | 0 refills | Status: AC

## 2020-02-26 NOTE — Discharge Instructions (Addendum)
Your Chest x Ray was concerning for pneumonia today.   Take the antibiotics as prescribed. Drink at least 6-8 glasses of water daily for the next several days.  Follow-up with a primary doctor in the next week.  YOUR CHEST X RAY ALSO SHOWED A SMALL LUNG NODULE IN THE LEFT UPPER LOBE. RADIOLOGY RECOMMENDS REPEATING A CHEST X RAY IN 6 WEEKS TO FOLLOW THIS UP. MAKE SURE YOU TELL YOUR DOCTOR TO FOLLOW THIS UP.

## 2020-02-26 NOTE — ED Provider Notes (Signed)
Mariah Thornton  ____________________________________________   Mariah Thornton 02/26/20 2209     (approximate)  I have reviewed the triage vital signs and the nursing notes.   HISTORY  Chief Complaint Weakness    HPI Mariah Thornton is a 74 y.o. female  With no significant PMHx here with generalized weakness. Pt arrives with friend. Per report, she has been more drowsy than usual and has just "felt weak." She lives with her husband who is concerned and advised her to come to the ED for evaluation. She initially reported she was "passing out" to nursing but on further questioning, it sounds like more so she is just "nodding off" from being more tired than usual. Reports today for evaluation. She has had some nasal congestion and cough that has worsened over past day or two. No pain. No focal numbness or weakness.         History reviewed. No pertinent past medical history.  There are no problems to display for this Thornton.   History reviewed. No pertinent surgical history.  Prior to Admission medications   Medication Sig Start Date End Date Taking? Authorizing Provider  amoxicillin-clavulanate (AUGMENTIN) 875-125 MG tablet Take 1 tablet by mouth 2 (two) times daily for 7 days. 02/26/20 03/04/20  Duffy Bruce, MD  azithromycin (ZITHROMAX Z-PAK) 250 MG tablet Take 1 tablet (250 mg total) by mouth daily for 4 days. 02/26/20 03/01/20  Duffy Bruce, MD    Allergies Thornton has no known allergies.  No family history on file.  Social History Social History   Tobacco Use  . Smoking status: Never Smoker  . Smokeless tobacco: Never Used  Substance Use Topics  . Alcohol use: Not Currently  . Drug use: Not Currently    Review of Systems  Review of Systems  Constitutional: Positive for fatigue. Negative for fever.  HENT: Negative for congestion and sore throat.   Eyes: Negative for visual  disturbance.  Respiratory: Positive for cough. Negative for shortness of breath.   Cardiovascular: Negative for chest pain.  Gastrointestinal: Negative for abdominal pain, diarrhea, nausea and vomiting.  Genitourinary: Negative for flank pain.  Musculoskeletal: Negative for back pain and neck pain.  Skin: Negative for rash and wound.  Neurological: Positive for weakness.  All other systems reviewed and are negative.    ____________________________________________  PHYSICAL EXAM:      VITAL SIGNS: ED Triage Vitals [02/26/20 1333]  Enc Vitals Group     BP 135/63     Pulse Rate 91     Resp 16     Temp 98.9 F (37.2 C)     Temp Source Oral     SpO2 97 %     Weight 139 lb (63 kg)     Height 5\' 2"  (1.575 m)     Head Circumference      Peak Flow      Pain Score 0     Pain Loc      Pain Edu?      Excl. in [place]?      Physical Exam Vitals and nursing Thornton reviewed.  Constitutional:      General: She is not in acute distress.    Appearance: She is well-developed.  HENT:     Head: Normocephalic and atraumatic.  Eyes:     Conjunctiva/sclera: Conjunctivae normal.  Cardiovascular:     Rate and Rhythm: Normal rate and regular rhythm.     Heart  sounds: Normal heart sounds. No murmur heard.  No friction rub.  Pulmonary:     Effort: Pulmonary effort is normal. No respiratory distress.     Breath sounds: Examination of the left-lower field reveals rales. Rales present. No wheezing.  Abdominal:     General: There is no distension.     Palpations: Abdomen is soft.     Tenderness: There is no abdominal tenderness.  Musculoskeletal:     Cervical back: Neck supple.  Skin:    General: Skin is warm.     Capillary Refill: Capillary refill takes less than 2 seconds.  Neurological:     Mental Status: She is alert and oriented to person, place, and time.     Motor: No abnormal muscle tone.       ____________________________________________   LABS (all labs ordered are listed,  but only abnormal results are displayed)  Labs Reviewed  BASIC METABOLIC PANEL - Abnormal; Notable for the following components:      Result Value   Sodium 134 (*)    Glucose, Bld 106 (*)    Creatinine, Ser 1.04 (*)    Calcium 11.5 (*)    GFR calc non Af Amer 53 (*)    All other components within normal limits  CBC - Abnormal; Notable for the following components:   RBC 3.31 (*)    Hemoglobin 8.6 (*)    HCT 29.0 (*)    MCHC 29.7 (*)    RDW 15.8 (*)    All other components within normal limits  URINALYSIS, COMPLETE (UACMP) WITH MICROSCOPIC - Abnormal; Notable for the following components:   Color, Urine YELLOW (*)    APPearance HAZY (*)    Hgb urine dipstick MODERATE (*)    Protein, ur 30 (*)    Bacteria, UA RARE (*)    All other components within normal limits  BLOOD GAS, VENOUS  CBG MONITORING, ED    ____________________________________________  EKG: Normal sinus rhythm, VR 79. PR 144, QRS 80, QTc 408. No acute ST elevations or depressions. No ischemia or infarct. ________________________________________  RADIOLOGY All imaging, including plain films, CT scans, and ultrasounds, independently reviewed by me, and interpretations confirmed via formal radiology reads.  ED MD interpretation:   CT Head: NAICA, subcutaneous nodules (known lipomas/cysts) CXR: Lingular PNA, nodule noted LUL  Official radiology report(s): DG Chest 2 View  Result Date: 02/26/2020 CLINICAL DATA:  Weakness EXAM: CHEST - 2 VIEW COMPARISON:  None. FINDINGS: The lungs are symmetrically expanded. There is focal lingular infiltrate, possibly infectious in the appropriate clinical setting. 11 mm nodule is seen within the a left upper lobe, indeterminate. No pneumothorax. Tiny right pleural effusion is present. Cardiac size within normal limits. Pulmonary vascularity is normal. No acute bone abnormality. IMPRESSION: 1. Lingular infiltrate, possibly infectious in the appropriate clinical setting. 2. Tiny right  pleural effusion. 3. 11 mm nodule in the left upper lobe, indeterminate. Follow-up chest radiograph is recommended in 6 weeks, once the Thornton's acute issues have resolved. If persistent, this could be further assessed with dedicated CT imaging. Electronically Signed   By: Fidela Salisbury MD   On: 02/26/2020 23:05   CT Head Wo Contrast  Result Date: 02/26/2020 CLINICAL DATA:  Weakness, altered level of consciousness EXAM: CT HEAD WITHOUT CONTRAST TECHNIQUE: Contiguous axial images were obtained from the base of the skull through the vertex without intravenous contrast. COMPARISON:  None. FINDINGS: Brain: Confluent hypodensities within the periventricular white matter are most consistent with chronic small vessel ischemic  changes. No signs of acute infarct or hemorrhage. Lateral ventricles and remaining midline structures are unremarkable. No acute extra-axial fluid collections. No mass effect. Vascular: No hyperdense vessel or unexpected calcification. Skull: Multiple nonspecific soft tissue nodules are seen within the scalp, largest along the left frontal convexity measuring 2.9 cm. Etiology is indeterminate, correlation with physical exam is recommended. Underlying calvarium is unremarkable. No acute fracture. Sinuses/Orbits: No acute finding. Other: None. IMPRESSION: 1. Chronic small vessel ischemic change throughout the white matter. No acute intracranial process. 2. Multiple soft tissue nodules within the scalp, largest measuring 2.9 cm along the left frontal convexity. Etiology indeterminate. Please correlate with physical exam findings. Electronically Signed   By: Randa Ngo M.D.   On: 02/26/2020 22:38    ____________________________________________  PROCEDURES   Procedure(s) performed (including Critical Care):  Procedures  ____________________________________________  INITIAL IMPRESSION / MDM / [place] / ED COURSE  As part of my medical decision making, I reviewed the  following data within the [facility] notes reviewed and incorporated, Old chart reviewed, Notes from prior ED visits, and [place] Controlled Substance Database       *ELLISA DEVIVO was evaluated in Emergency Department on 02/27/2020 for the symptoms described in the history of present illness. She was evaluated in the context of the global COVID-19 pandemic, which necessitated consideration that the Thornton might be at risk for infection with the SARS-CoV-2 virus that causes COVID-19. Institutional protocols and algorithms that pertain to the evaluation of patients at risk for COVID-19 are in a state of rapid change based on information released by regulatory bodies including the CDC and federal and state organizations. These policies and algorithms were followed during the Thornton's care in the ED.  Some ED evaluations and interventions may be delayed as a result of limited staffing during the pandemic.*     Medical Decision Making:  74 yo very well appearing female here with reported generalized weakness, spells of drowsiness. She is awake, alert on my assessment, able to ambulate. She has no focal neurological deficits. Her reported "going out" spells sounds more like drowsiness in setting of general medical illness. WBC normal. Hgb 8.6, unclear if this is chronic but unlikely etiology for her symptoms. BMP with normal lytes, though mild hypercalcemia is noted. CXR is concerning for possible PNA, and clinically she does have rales in her left lung fields.  Suspect mild weakness in setting of CAP with dehydration. IVF, ABX given. Offered admission but given pt's well appearance, and improvement with fluids, she would like to return home which I think is reasonable. Will cover broadly, encourage fluids and PCP f/u. Incidental nodule noted on CXR - will have her f/u with CXR as outpatient.  ____________________________________________  FINAL CLINICAL IMPRESSION(S) / ED  DIAGNOSES  Final diagnoses:  Community acquired pneumonia of left upper lobe of lung  Pulmonary nodule     MEDICATIONS GIVEN DURING THIS VISIT:  Medications  sodium chloride 0.9 % bolus 1,000 mL (0 mLs Intravenous Stopped 02/27/20 0103)  cefTRIAXone (ROCEPHIN) 2 g in sodium chloride 0.9 % 100 mL IVPB (0 g Intravenous Stopped 02/27/20 0103)  azithromycin (ZITHROMAX) tablet 500 mg (500 mg Oral Given 02/26/20 2338)     ED Discharge Orders         Ordered    azithromycin (ZITHROMAX Z-PAK) 250 MG tablet  Daily        02/26/20 2345    amoxicillin-clavulanate (AUGMENTIN) 875-125 MG tablet  2 times daily  02/26/20 2345           Thornton:  This document was prepared using Dragon voice recognition software and may include unintentional dictation errors.   Duffy Bruce, MD 02/27/20 0300

## 2020-02-26 NOTE — ED Triage Notes (Signed)
Pt to ED via POV stating that she is feeling weak and having episodes where she "goes out". Pt states that EMS checked her this morning and told her that her vital signs and her CBG was OK. Pt is alert and in NAD.

## 2020-03-03 ENCOUNTER — Other Ambulatory Visit: Payer: Self-pay

## 2020-03-03 ENCOUNTER — Inpatient Hospital Stay: Payer: Medicare Other

## 2020-03-03 ENCOUNTER — Encounter: Payer: Self-pay | Admitting: Emergency Medicine

## 2020-03-03 ENCOUNTER — Emergency Department: Payer: Medicare Other

## 2020-03-03 ENCOUNTER — Inpatient Hospital Stay
Admission: EM | Admit: 2020-03-03 | Discharge: 2020-03-09 | DRG: 844 | Disposition: A | Discharge status: Home / Self Care | Payer: Medicare Other | Attending: Hospitalist | Admitting: Hospitalist

## 2020-03-03 ENCOUNTER — Inpatient Hospital Stay: Admission: RE | Admit: 2020-03-03 | Payer: Medicare Other | Source: Ambulatory Visit

## 2020-03-03 DIAGNOSIS — Z8249 Family history of ischemic heart disease and other diseases of the circulatory system: Secondary | ICD-10-CM

## 2020-03-03 DIAGNOSIS — E538 Deficiency of other specified B group vitamins: Secondary | ICD-10-CM | POA: Diagnosis present

## 2020-03-03 DIAGNOSIS — D630 Anemia in neoplastic disease: Secondary | ICD-10-CM | POA: Diagnosis present

## 2020-03-03 DIAGNOSIS — C7989 Secondary malignant neoplasm of other specified sites: Secondary | ICD-10-CM | POA: Diagnosis present

## 2020-03-03 DIAGNOSIS — D49511 Neoplasm of unspecified behavior of right kidney: Secondary | ICD-10-CM | POA: Diagnosis not present

## 2020-03-03 DIAGNOSIS — C641 Malignant neoplasm of right kidney, except renal pelvis: Secondary | ICD-10-CM | POA: Diagnosis present

## 2020-03-03 DIAGNOSIS — C7802 Secondary malignant neoplasm of left lung: Secondary | ICD-10-CM | POA: Diagnosis present

## 2020-03-03 DIAGNOSIS — D649 Anemia, unspecified: Secondary | ICD-10-CM | POA: Diagnosis not present

## 2020-03-03 DIAGNOSIS — R262 Difficulty in walking, not elsewhere classified: Secondary | ICD-10-CM | POA: Diagnosis present

## 2020-03-03 DIAGNOSIS — R161 Splenomegaly, not elsewhere classified: Secondary | ICD-10-CM | POA: Diagnosis present

## 2020-03-03 DIAGNOSIS — Z9181 History of falling: Secondary | ICD-10-CM

## 2020-03-03 DIAGNOSIS — R531 Weakness: Secondary | ICD-10-CM | POA: Diagnosis present

## 2020-03-03 DIAGNOSIS — N2889 Other specified disorders of kidney and ureter: Secondary | ICD-10-CM | POA: Diagnosis not present

## 2020-03-03 DIAGNOSIS — E876 Hypokalemia: Secondary | ICD-10-CM | POA: Diagnosis present

## 2020-03-03 DIAGNOSIS — Z20822 Contact with and (suspected) exposure to covid-19: Secondary | ICD-10-CM | POA: Diagnosis present

## 2020-03-03 DIAGNOSIS — R918 Other nonspecific abnormal finding of lung field: Secondary | ICD-10-CM | POA: Diagnosis not present

## 2020-03-03 DIAGNOSIS — C7951 Secondary malignant neoplasm of bone: Secondary | ICD-10-CM | POA: Diagnosis present

## 2020-03-03 DIAGNOSIS — M8950 Osteolysis, unspecified site: Secondary | ICD-10-CM | POA: Diagnosis not present

## 2020-03-03 DIAGNOSIS — K7460 Unspecified cirrhosis of liver: Secondary | ICD-10-CM | POA: Diagnosis present

## 2020-03-03 DIAGNOSIS — R911 Solitary pulmonary nodule: Secondary | ICD-10-CM | POA: Diagnosis not present

## 2020-03-03 LAB — URINALYSIS, COMPLETE (UACMP) WITH MICROSCOPIC
Bacteria, UA: NONE SEEN
Bilirubin Urine: NEGATIVE
Glucose, UA: NEGATIVE mg/dL
Ketones, ur: NEGATIVE mg/dL
Leukocytes,Ua: NEGATIVE
Nitrite: NEGATIVE
Protein, ur: 100 mg/dL — AB
Specific Gravity, Urine: 1.024 (ref 1.005–1.030)
pH: 5 (ref 5.0–8.0)

## 2020-03-03 LAB — HEPATIC FUNCTION PANEL
ALT: 12 U/L (ref 0–44)
AST: 26 U/L (ref 15–41)
Albumin: 2.9 g/dL — ABNORMAL LOW (ref 3.5–5.0)
Alkaline Phosphatase: 109 U/L (ref 38–126)
Bilirubin, Direct: 0.2 mg/dL (ref 0.0–0.2)
Indirect Bilirubin: 0.5 mg/dL (ref 0.3–0.9)
Total Bilirubin: 0.7 mg/dL (ref 0.3–1.2)
Total Protein: 7.6 g/dL (ref 6.5–8.1)

## 2020-03-03 LAB — BASIC METABOLIC PANEL
Anion gap: 11 (ref 5–15)
BUN: 12 mg/dL (ref 8–23)
CO2: 23 mmol/L (ref 22–32)
Calcium: 12.3 mg/dL — ABNORMAL HIGH (ref 8.9–10.3)
Chloride: 103 mmol/L (ref 98–111)
Creatinine, Ser: 0.96 mg/dL (ref 0.44–1.00)
GFR calc Af Amer: >60 mL/min (ref >60)
GFR calc non Af Amer: 58 mL/min — ABNORMAL LOW (ref >60)
Glucose, Bld: 94 mg/dL (ref 70–99)
Potassium: 4.7 mmol/L (ref 3.5–5.1)
Sodium: 137 mmol/L (ref 135–145)

## 2020-03-03 LAB — CBC
HCT: 28.5 % — ABNORMAL LOW (ref 36.0–46.0)
Hemoglobin: 8.8 g/dL — ABNORMAL LOW (ref 12.0–15.0)
MCH: 26.1 pg (ref 26.0–34.0)
MCHC: 30.9 g/dL (ref 30.0–36.0)
MCV: 84.6 fL (ref 80.0–100.0)
Platelets: 198 10*3/uL (ref 150–400)
RBC: 3.37 MIL/uL — ABNORMAL LOW (ref 3.87–5.11)
RDW: 15.9 % — ABNORMAL HIGH (ref 11.5–15.5)
WBC: 5.9 10*3/uL (ref 4.0–10.5)
nRBC: 0 % (ref 0.0–0.2)

## 2020-03-03 LAB — TSH: TSH: 3.891 u[IU]/mL (ref 0.350–4.500)

## 2020-03-03 LAB — T4, FREE: Free T4: 1.03 ng/dL (ref 0.61–1.12)

## 2020-03-03 LAB — SARS CORONAVIRUS 2 BY RT PCR (HOSPITAL ORDER, PERFORMED IN ~~LOC~~ HOSPITAL LAB): SARS Coronavirus 2: NEGATIVE

## 2020-03-03 IMAGING — CR DG CHEST 2V
2 series · 2 of 2 positions shown · non-contrast
Comparison: [DATE]

CLINICAL DATA: Weakness, syncope

EXAM:
CHEST - 2 VIEW

[chest lat]
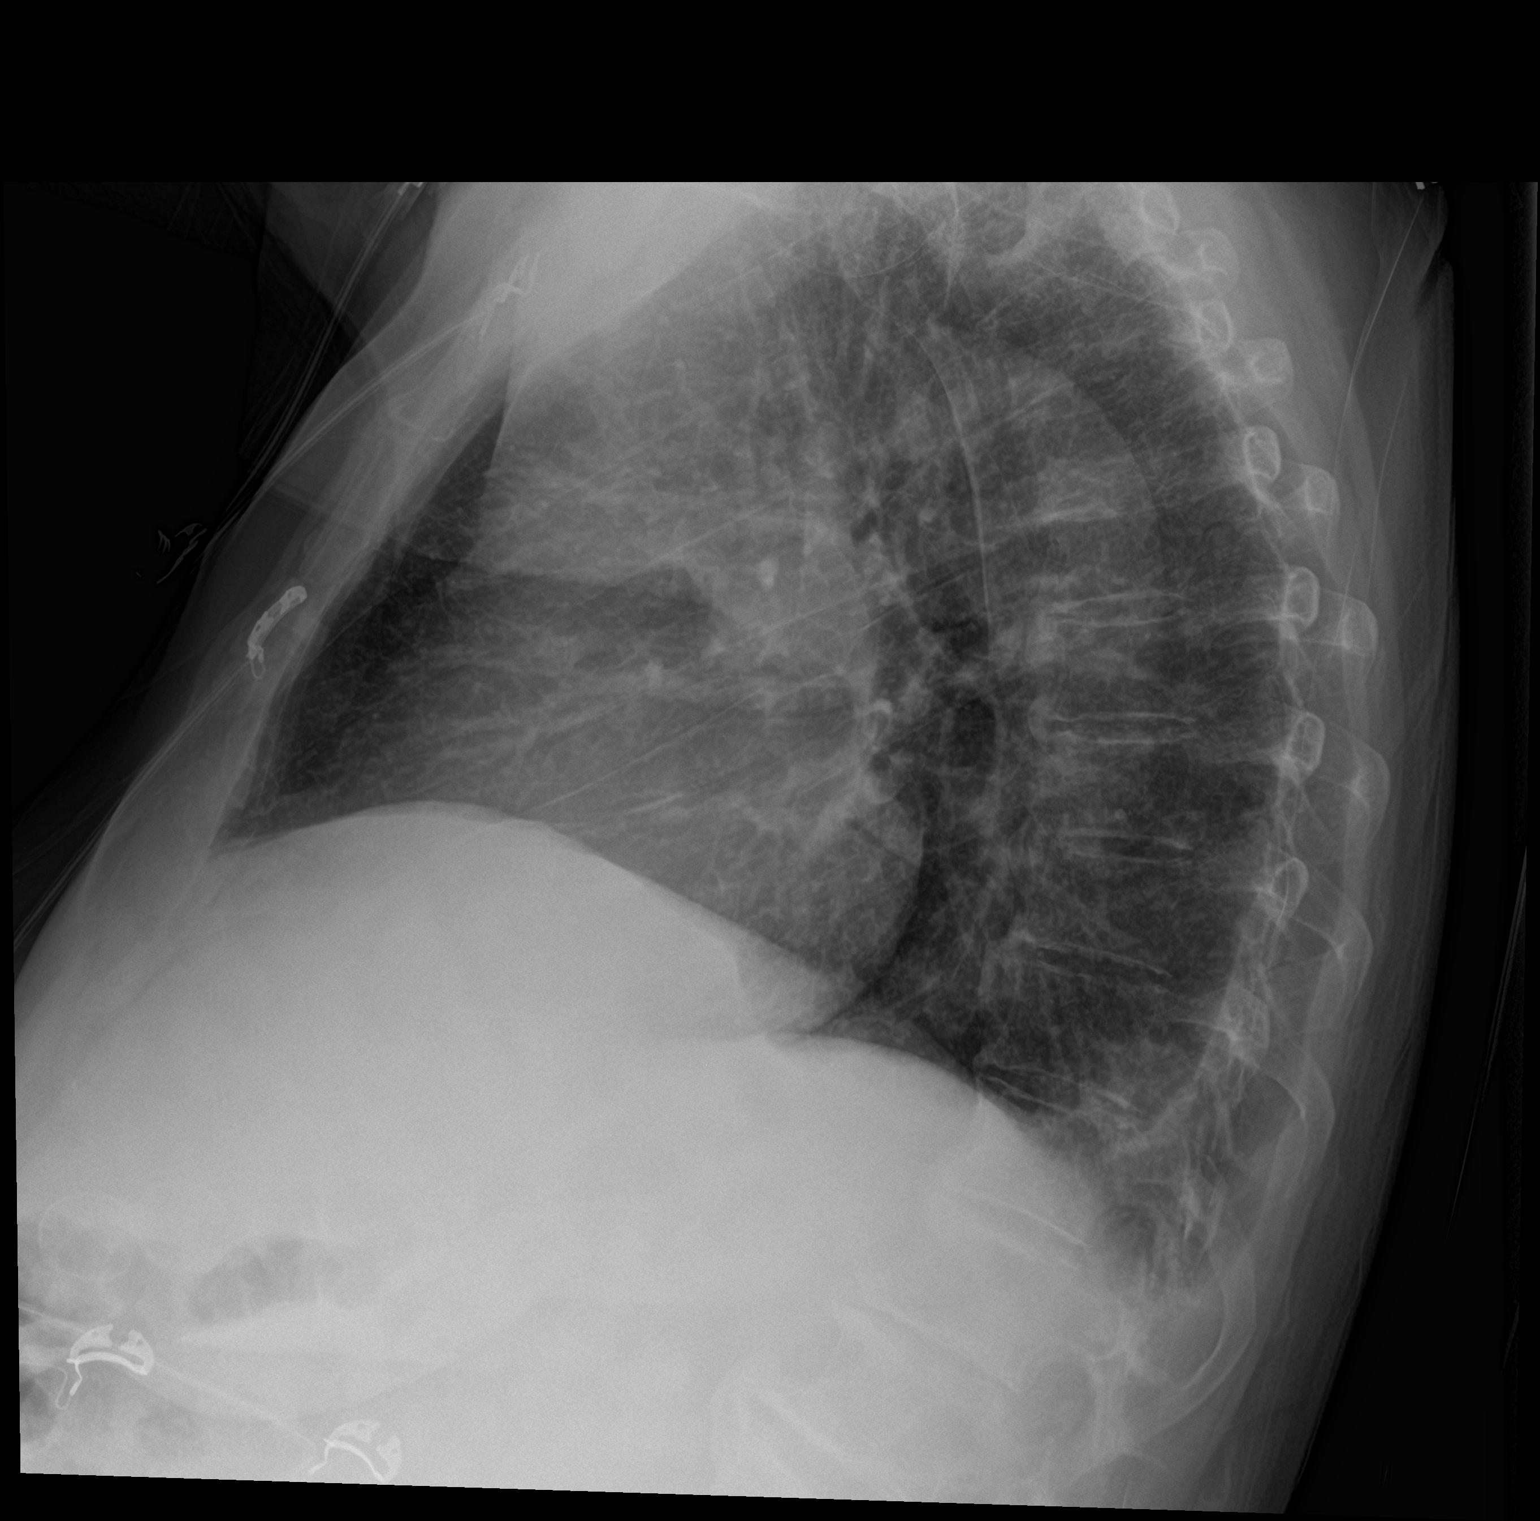

[chest ap]
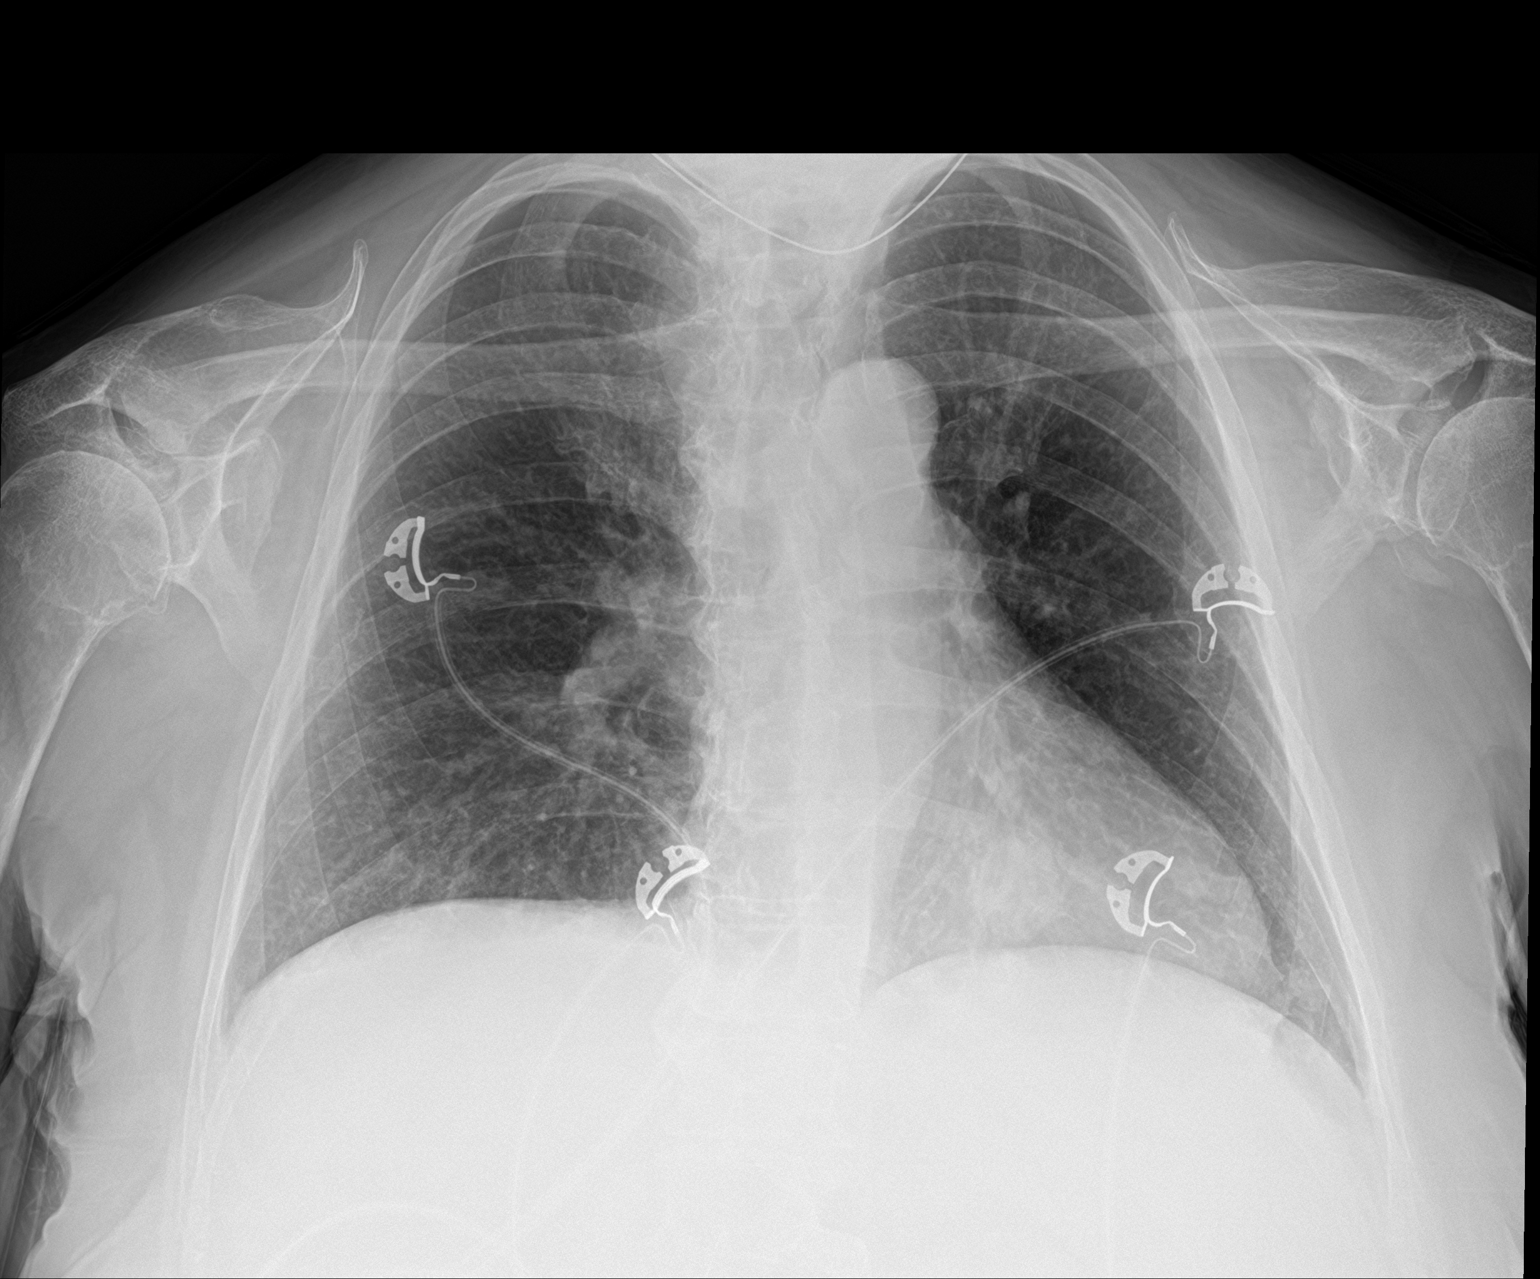

[2 of 2 positions shown; findings below may reference images not displayed]

FINDINGS: Right lung is grossly clear. Left lower lobe 2.5 cm suspected lung
nodule. Previously noted possible left upper lobe lung nodule is not
well seen today. Stable cardiomediastinal silhouette. No
pneumothorax. Trace pleural effusions.
IMPRESSION: 1. Trace pleural effusions.
2. Left lower lobe 2.5 cm suspected lung nodule. Chest CT is
recommended for further evaluation.

## 2020-03-03 IMAGING — CT CT CHEST W/O CM
2 of 3 series · 15 of 36 positions shown, 18 images · non-contrast
Comparison: None.

CLINICAL DATA: Lung nodule, hypercalcemia

EXAM:
CT CHEST WITHOUT CONTRAST
TECHNIQUE: Multidetector CT imaging of the chest was performed following the
standard protocol without IV contrast.

[Series 2: thorax · axial · 0.71mm/px · z∈[+303,+535]mm · 12 of 138 slices shown, 15 images]
[im 11/138  mediastinal]
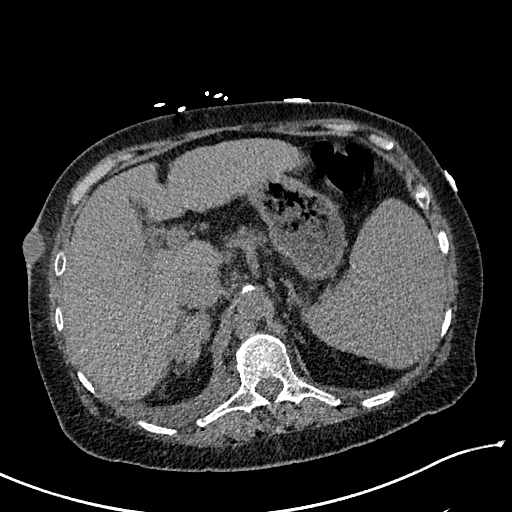
[im 11/138  lung]
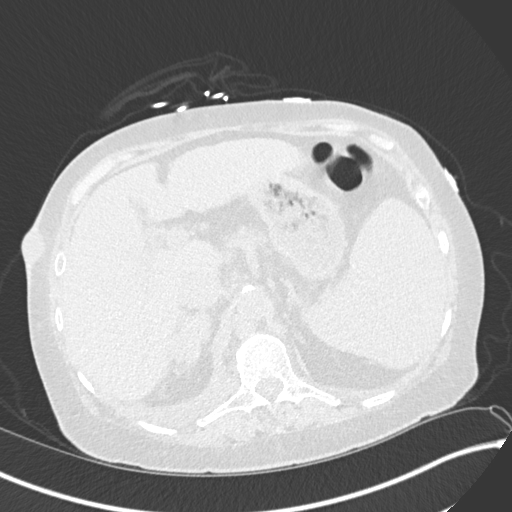
[im 21/138  lung]
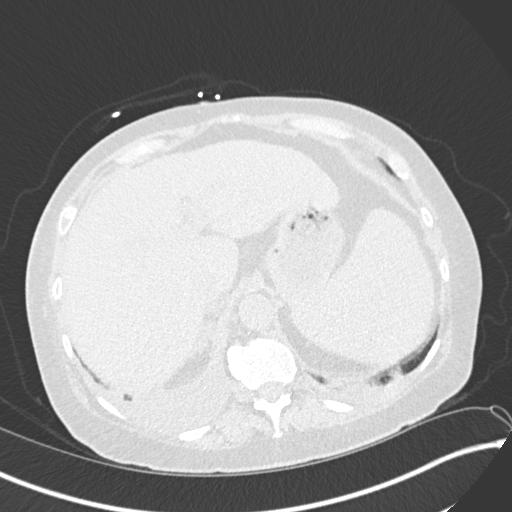
[im 31/138  lung]
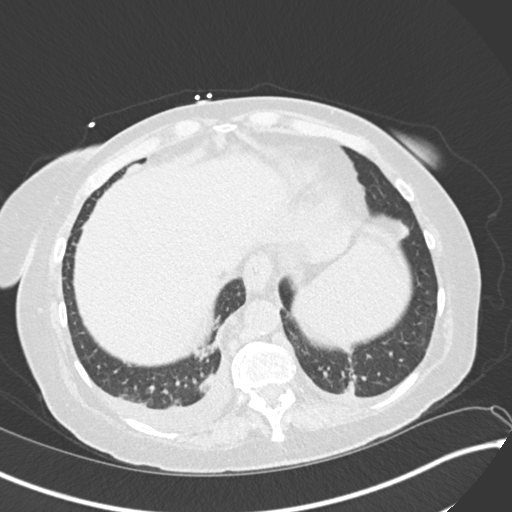
[im 41/138  lung]
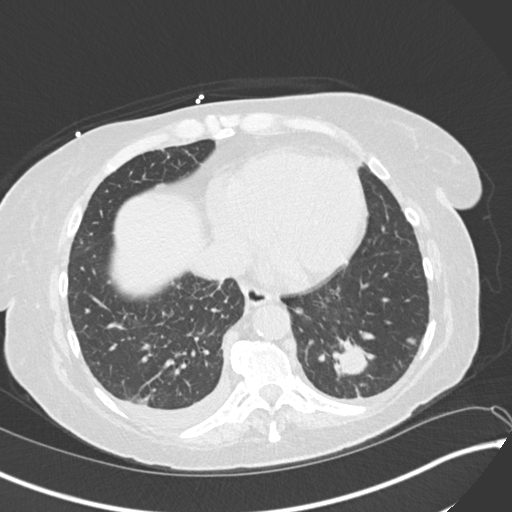
[im 51/138  mediastinal]
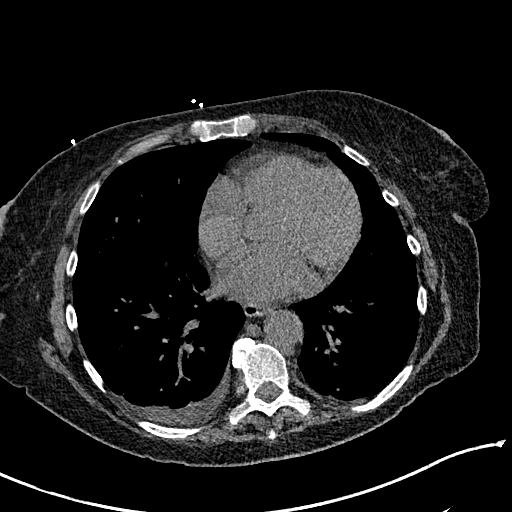
[im 51/138  lung]
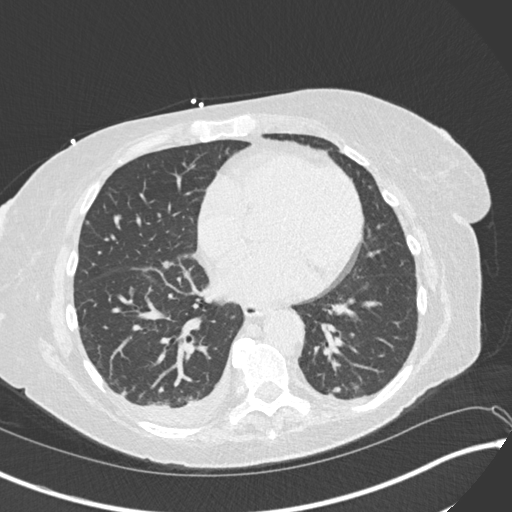
[im 61/138  lung]
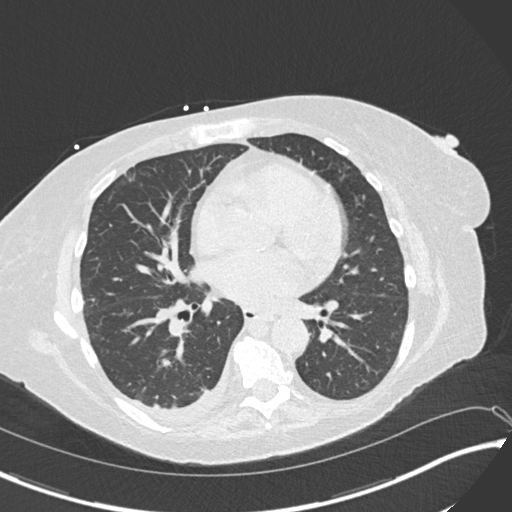
[im 77/138  lung]
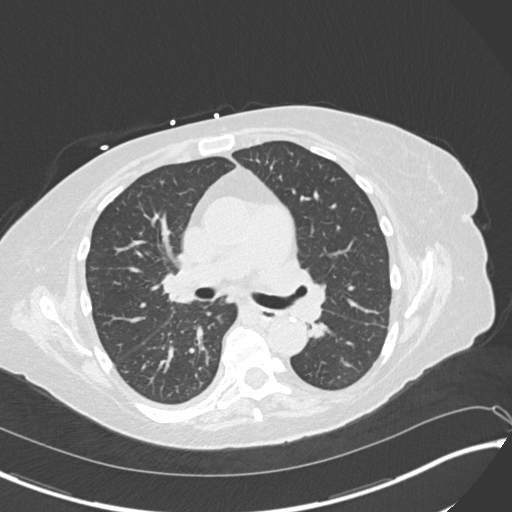
[im 87/138  lung]
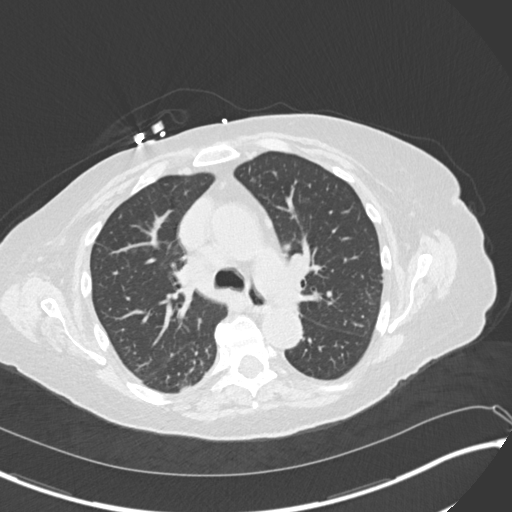
[im 97/138  mediastinal]
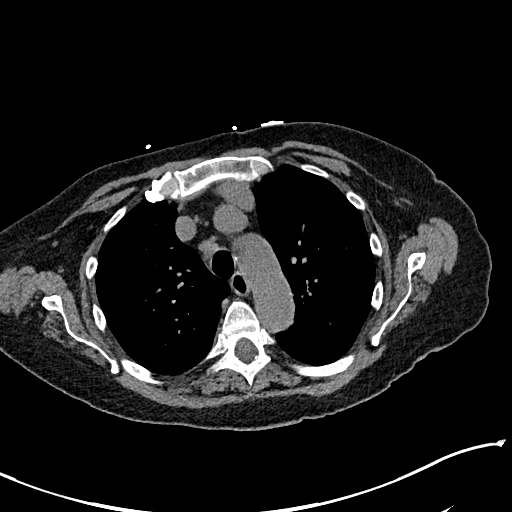
[im 97/138  lung]
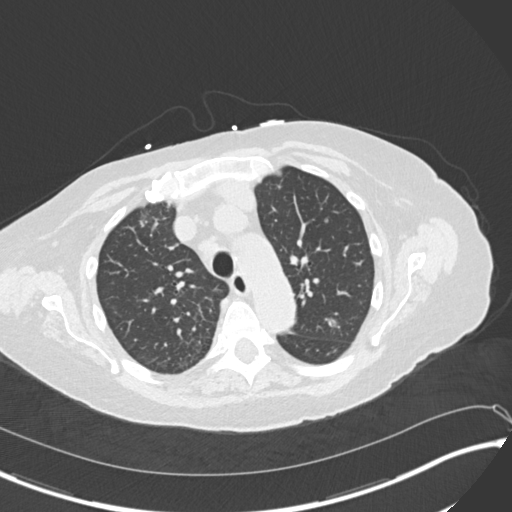
[im 107/138  lung]
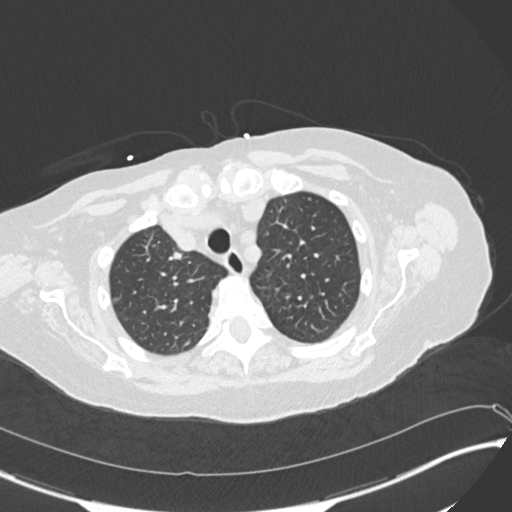
[im 117/138  lung]
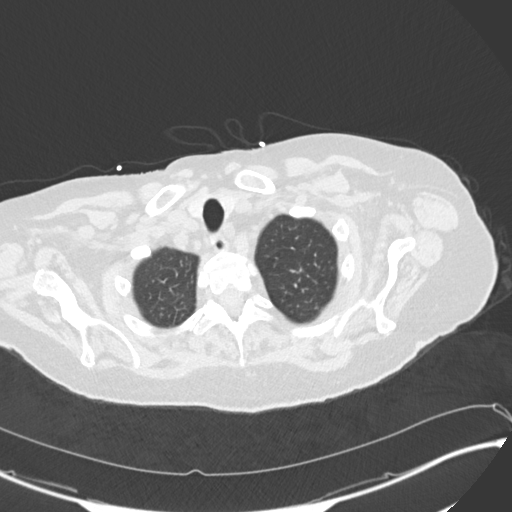
[im 127/138  lung]
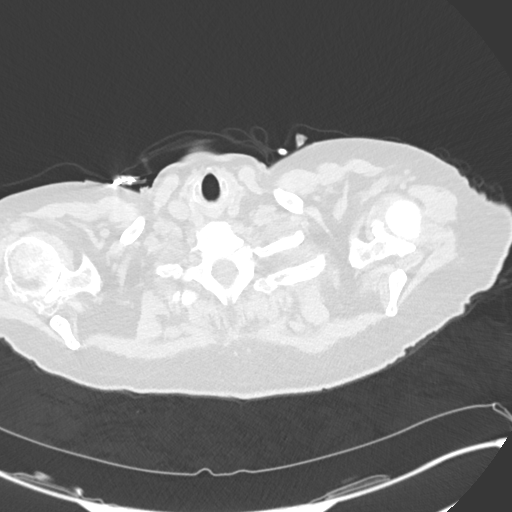

[Series 5: coronal · coronal · 0.54mm/px · 3 of 127 slices shown]
[im 26/127  lung]
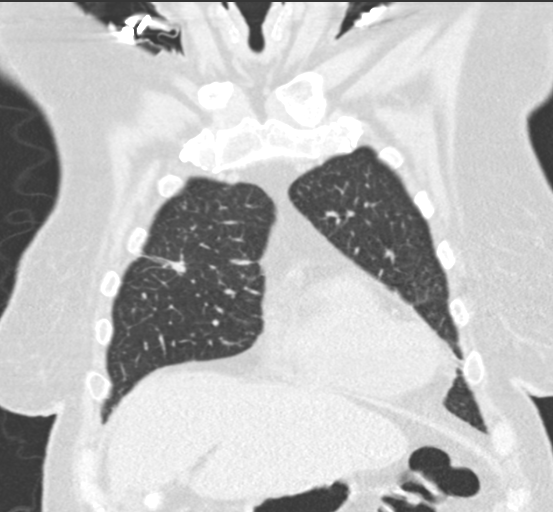
[im 51/127  lung]
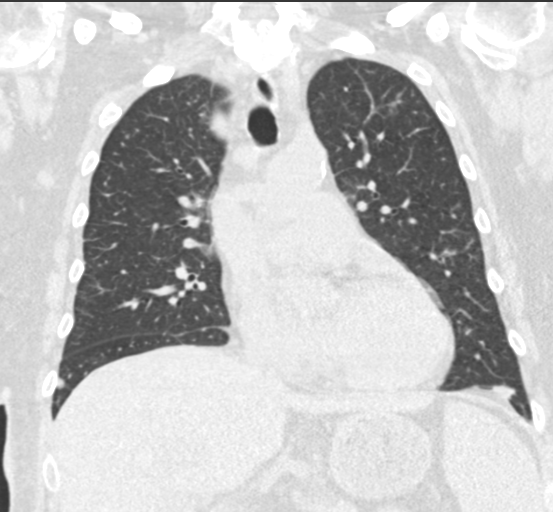
[im 76/127  lung]
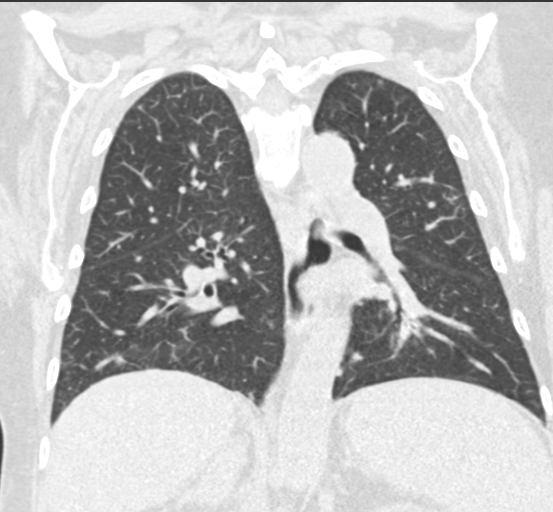

[15 of 36 positions shown; findings below may reference images not displayed]

FINDINGS: Cardiovascular: Cardiac size is within normal limits. No significant
coronary artery calcification. No pericardial effusion. The central
pulmonary arteries are enlarged in keeping with changes of pulmonary
arterial hypertension. The thoracic aorta is unremarkable.

Mediastinum/Nodes: There is pathologic right paratracheal adenopathy
with a a single lymph node measuring 15 mm x 20 mm at axial image #
54/2. Esophagus unremarkable. Thyroid unremarkable.

Lungs/Pleura: There are multiple pulmonary nodules identified within
the lungs bilaterally demonstrating a basilar predominance. The
nodule noted on prior chest radiograph represents the dominant
nodule measuring 24 mm x 24 mm at axial image # 101/3. There are at
least 20 such nodule seen scattered throughout the lungs. Small
right pleural effusion is present. The appearance and distribution
are suspicious for metastatic disease. A primary pulmonary mass with
intra pulmonary metastases is considered less likely. Small right
pleural effusion is present. No pneumothorax. No central obstructing
mass.

Upper Abdomen: Cholelithiasis. There is nodular enlargement of the
right adrenal gland, suspicious for metastatic involvement. There
is pathologic periaortic and retrocrural adenopathy with the index
lymph node measuring 13 mm in short axis diameter at axial image #
128/2

Musculoskeletal: There are 3 possible subcentimeter lytic lesions
identified within the visualized portion of the L2 vertebral body
involving the anterior aspect the vertebral body as well as the
right pedicle. This is incompletely included on this examination and
not well assessed. No other lytic or blastic bone lesion is
identified.
IMPRESSION: Multiple pulmonary nodules, the dominant nodule of which represents
the nodule seen on a prior chest radiograph, thoracic and
retroperitoneal adenopathy, and probable malignant involvement of
the right adrenal gland all suspicious for metastatic disease.
Possible lytic lesion within the L2 vertebral body. The organ of
origin is not clearly identified on this examination. Small right
pleural effusion is nonspecific though a malignant pleural effusion
must be considered. Completion examination of the abdomen and pelvis
with contrast and possible PET CT examination may be helpful for
further evaluation.

Cholelithiasis.

Enlarged central pulmonary arteries in keeping with changes of
pulmonary arterial hypertension.

## 2020-03-03 MED ORDER — ZOLEDRONIC ACID 4 MG/5ML IV CONC
4.0000 mg | Freq: Once | INTRAVENOUS | Status: AC
  Administered 2020-03-04: 4 mg via INTRAVENOUS
  Filled 2020-03-03: qty 5

## 2020-03-03 MED ORDER — BISACODYL 5 MG PO TBEC: 10.0000 mg | DELAYED_RELEASE_TABLET | Freq: Every day | ORAL | Status: DC | PRN

## 2020-03-03 MED ORDER — SODIUM CHLORIDE 0.9 % IV SOLN: INTRAVENOUS | Status: DC

## 2020-03-03 MED ORDER — SODIUM CHLORIDE 0.9 % IV SOLN: Freq: Once | INTRAVENOUS | Status: DC

## 2020-03-03 MED ORDER — ONDANSETRON HCL 4 MG PO TABS: 4.0000 mg | ORAL_TABLET | Freq: Four times a day (QID) | ORAL | Status: DC | PRN

## 2020-03-03 MED ORDER — SODIUM CHLORIDE 0.9 % IV BOLUS
1000.0000 mL | Freq: Once | INTRAVENOUS | Status: AC
  Administered 2020-03-03: 1000 mL via INTRAVENOUS

## 2020-03-03 MED ORDER — MORPHINE SULFATE (PF) 2 MG/ML IV SOLN
1.0000 mg | INTRAVENOUS | Status: DC | PRN
  Administered 2020-03-05: 1 mg via INTRAVENOUS
  Filled 2020-03-03: qty 1

## 2020-03-03 MED ORDER — SODIUM CHLORIDE 0.9% FLUSH
3.0000 mL | Freq: Two times a day (BID) | INTRAVENOUS | Status: DC
  Administered 2020-03-04 – 2020-03-09 (×9): 3 mL via INTRAVENOUS

## 2020-03-03 MED ORDER — ENOXAPARIN SODIUM 40 MG/0.4ML ~~LOC~~ SOLN
40.0000 mg | SUBCUTANEOUS | Status: DC
  Administered 2020-03-04 – 2020-03-06 (×4): 40 mg via SUBCUTANEOUS
  Filled 2020-03-03 (×4): qty 0.4

## 2020-03-03 MED ORDER — ONDANSETRON HCL 4 MG/2ML IJ SOLN: 4.0000 mg | Freq: Four times a day (QID) | INTRAMUSCULAR | Status: DC | PRN

## 2020-03-03 MED ORDER — ACETAMINOPHEN 650 MG RE SUPP: 650.0000 mg | Freq: Four times a day (QID) | RECTAL | Status: DC | PRN

## 2020-03-03 MED ORDER — DOCUSATE SODIUM 100 MG PO CAPS
200.0000 mg | ORAL_CAPSULE | Freq: Two times a day (BID) | ORAL | Status: DC
  Administered 2020-03-04 – 2020-03-08 (×9): 200 mg via ORAL
  Filled 2020-03-03 (×12): qty 2

## 2020-03-03 MED ORDER — ACETAMINOPHEN 325 MG PO TABS: 650.0000 mg | ORAL_TABLET | Freq: Four times a day (QID) | ORAL | Status: DC | PRN

## 2020-03-03 MED ORDER — TRAMADOL HCL 50 MG PO TABS: 50.0000 mg | ORAL_TABLET | Freq: Four times a day (QID) | ORAL | Status: DC | PRN

## 2020-03-03 NOTE — H&P (Signed)
History and Physical    Mariah Thornton ALP:[id number] DOB: April 21, 1946 DOA: 03/03/2020  PCP: Patient, No Pcp Per Patient coming from: home    Chief Complaint: back pain   HPI: 74 y/o F w/ no known significant PMH who presented w/ back pain x day of admission. The back pain is located in lower back. The pain is dull, intermittent w/o radiation. Laying on her side makes the pain better and nothing makes the worse. The severity is currently 0/10. Of note, pt does not see any type of physician as an outpatient and takes no medications at home. Pt denies any weakness but pt's friend who is bedside stated the pt has recently had difficulty walking. Pt also loss approx 6 lbs unintentionally over one month. Pt denies any fever, chills, sweating, chest pain, shortness of breath, nausea, vomiting, abd pain, dysuria, urinary urgency, urinary frequency, diarrhea, or constipation.   Review of Systems: As per HPI otherwise 10 point review of systems negative.    History reviewed. No pertinent past medical history.  History reviewed. No pertinent surgical history.   reports that she has never smoked. She has never used smokeless tobacco. She reports previous alcohol use. She reports previous drug use.  No Known Allergies  No family history on file.  No family hx of DM Father: HTN   Prior to Admission medications   Medication Sig Start Date End Date Taking? Authorizing Provider  amoxicillin-clavulanate (AUGMENTIN) 875-125 MG tablet Take 1 tablet by mouth 2 (two) times daily for 7 days. 02/26/20 03/04/20  Duffy Bruce, MD    Physical Exam: Vitals:   03/03/20 1000 03/03/20 1001 03/03/20 1557  BP: 117/74  (!) 148/58  Pulse: 83  78  Resp: 18  16  Temp: 98.1 F (36.7 C)    TempSrc: Oral    SpO2: 98%  98%  Weight:  63 kg   Height:  5\' 2"  (1.575 m)     Constitutional: NAD, calm, comfortable. Appears disheveled  Vitals:   03/03/20 1000 03/03/20 1001 03/03/20 1557  BP: 117/74  (!) 148/58    Pulse: 83  78  Resp: 18  16  Temp: 98.1 F (36.7 C)    TempSrc: Oral    SpO2: 98%  98%  Weight:  63 kg   Height:  5\' 2"  (1.575 m)    Eyes: PERRL, lids and conjunctivae normal ENMT: Mucous membranes are moist. Missing multiple teeth & has multiple cavities  Neck: normal, supple Respiratory: decreased breath sounds b/l. No rales, wheezes Cardiovascular: S1/S2+, no rubs / gallops.  +1 b/l LE pitting edema Abdomen: soft, no tenderness, non-distended, hypoactive bowel sounds  Musculoskeletal: no clubbing / cyanosis. Decreased strength of b/l LE  Skin: no rashes, lesions, ulcers.  Neurologic: CN 2-12 grossly intact. Moves all 4 extremities Psychiatric: Normal judgment and insight. Alert and oriented x 3. Normal mood.    Labs on Admission: I have personally reviewed following labs and imaging studies  CBC: Recent Labs  Lab 02/26/20 1342 03/03/20 1017  WBC 6.6 5.9  HGB 8.6* 8.8*  HCT 29.0* 28.5*  MCV 87.6 84.6  PLT 176 353   Basic Metabolic Panel: Recent Labs  Lab 02/26/20 1342 03/03/20 1017  NA 134* 137  K 4.2 4.7  CL 103 103  CO2 22 23  GLUCOSE 106* 94  BUN 18 12  CREATININE 1.04* 0.96  CALCIUM 11.5* 12.3*   GFR: Estimated Creatinine Clearance: 44.2 mL/min (by C-G formula based on SCr of 0.96 mg/dL). Liver Function  Tests: Recent Labs  Lab 03/03/20 1017  AST 26  ALT 12  ALKPHOS 109  BILITOT 0.7  PROT 7.6  ALBUMIN 2.9*   No results for input(s): LIPASE, AMYLASE in the last 168 hours. No results for input(s): AMMONIA in the last 168 hours. Coagulation Profile: No results for input(s): INR, PROTIME in the last 168 hours. Cardiac Enzymes: No results for input(s): CKTOTAL, CKMB, CKMBINDEX, TROPONINI in the last 168 hours. BNP (last 3 results) No results for input(s): PROBNP in the last 8760 hours. HbA1C: No results for input(s): HGBA1C in the last 72 hours. CBG: No results for input(s): GLUCAP in the last 168 hours. Lipid Profile: No results for  input(s): CHOL, HDL, LDLCALC, TRIG, CHOLHDL, LDLDIRECT in the last 72 hours. Thyroid Function Tests: No results for input(s): TSH, T4TOTAL, FREET4, T3FREE, THYROIDAB in the last 72 hours. Anemia Panel: No results for input(s): VITAMINB12, FOLATE, FERRITIN, TIBC, IRON, RETICCTPCT in the last 72 hours. Urine analysis:    Component Value Date/Time   COLORURINE YELLOW (A) 03/03/2020 1005   APPEARANCEUR CLOUDY (A) 03/03/2020 1005   LABSPEC 1.024 03/03/2020 1005   PHURINE 5.0 03/03/2020 1005   GLUCOSEU NEGATIVE 03/03/2020 1005   HGBUR SMALL (A) 03/03/2020 1005   BILIRUBINUR NEGATIVE 03/03/2020 [place] 03/03/2020 1005   PROTEINUR 100 (A) 03/03/2020 1005   NITRITE NEGATIVE 03/03/2020 1005   LEUKOCYTESUR NEGATIVE 03/03/2020 1005    Radiological Exams on Admission: DG Chest 2 View  Result Date: 03/03/2020 CLINICAL DATA:  Weakness, syncope EXAM: CHEST - 2 VIEW COMPARISON:  02/26/2020 FINDINGS: Right lung is grossly clear. Left lower lobe 2.5 cm suspected lung nodule. Previously noted possible left upper lobe lung nodule is not well seen today. Stable cardiomediastinal silhouette. No pneumothorax. Trace pleural effusions. IMPRESSION: 1. Trace pleural effusions. 2. Left lower lobe 2.5 cm suspected lung nodule. Chest CT is recommended for further evaluation. Electronically Signed   By: Donavan Foil M.D.   On: 03/03/2020 16:40    EKG: Independently reviewed.  Assessment/Plan Active Problems:   Hypercalcemia Left lung nodule: 2.5 cm on CXR. CT chest order to further evaluate. Possible malignancy  Hypercalcemia: will continue on IVFs. IV zolendronic acid ordered. Etiology unclear, possible secondary to underlying malignancy  Normocytic anemia: etiology unclear. No need for a transfusion at this time. Will continue to monitor   Back pain: etiology unclear, possibly secondary to underlying lung nodule. Tramadol, morphine prn for pain   Difficulty ambulating: PT/OT consulted    DVT prophylaxis: lovenox  Code Status: full  Family Communication: discussed pt's care w/ pt's friend who at bedside and answered her questions Disposition Plan: depends on PT/OT recs  Consults called:  Admission status: inpatient    Wyvonnia Dusky MD [facility] Pager [phone]  If 7PM-7AM, please contact night-coverage [email or web address]   03/03/2020, 5:59 PM

## 2020-03-03 NOTE — ED Provider Notes (Signed)
[facility] Emergency Department Provider Note  ____________________________________________   First MD Initiated Contact with Patient 03/03/20 1600     (approximate)  I have reviewed the triage vital signs and the nursing notes.   HISTORY  Chief Complaint Weakness    HPI Mariah Thornton is a 74 y.o. female  Here with reported weakness and confusion. Pt arrives from [facility]. Mariah Thornton was seen last week for similar issues, was diagnosed with possible PNA and sent home on ABX. Mariah Thornton states Mariah Thornton initially felt better. However, over the last "day or so" Mariah Thornton's had some worsening weakness, difficulty walking, and has been "falling asleep more."  No other specific complaints. No vision changes. No pain. Of note, Mariah Thornton does report recent increase in multiple soft tissue masses/lesions, for which Mariah Thornton is scheduled to see a Psychologist, sport and exercise. No other med changes. No other complaints.        History reviewed. No pertinent past medical history.  Patient Active Problem List   Diagnosis Date Noted  . Hypercalcemia 03/03/2020    History reviewed. No pertinent surgical history.  Prior to Admission medications   Medication Sig Start Date End Date Taking? Authorizing Provider  amoxicillin-clavulanate (AUGMENTIN) 875-125 MG tablet Take 1 tablet by mouth 2 (two) times daily for 7 days. 02/26/20 03/04/20 Yes Duffy Bruce, MD  azithromycin (ZITHROMAX) 250 MG tablet TAKE 1 TABLET BY MOUTH DAILY FOR 4 DAYS 02/27/20  Yes [provider]    Allergies Patient has no known allergies.  No family history on file.  Social History Social History   Tobacco Use  . Smoking status: Never Smoker  . Smokeless tobacco: Never Used  Substance Use Topics  . Alcohol use: Not Currently  . Drug use: Not Currently    Review of Systems  Review of Systems  Constitutional: Positive for fatigue. Negative for chills and fever.  HENT: Negative for sore throat.   Respiratory: Negative for shortness of  breath.   Cardiovascular: Negative for chest pain.  Gastrointestinal: Negative for abdominal pain.  Genitourinary: Negative for flank pain.  Musculoskeletal: Negative for neck pain.  Skin: Negative for rash and wound.  Allergic/Immunologic: Negative for immunocompromised state.  Neurological: Positive for weakness. Negative for numbness.  Hematological: Does not bruise/bleed easily.  Psychiatric/Behavioral: Positive for confusion and decreased concentration.  All other systems reviewed and are negative.    ____________________________________________  PHYSICAL EXAM:      VITAL SIGNS: ED Triage Vitals  Enc Vitals Group     BP 03/03/20 1000 117/74     Pulse Rate 03/03/20 1000 83     Resp 03/03/20 1000 18     Temp 03/03/20 1000 98.1 F (36.7 C)     Temp Source 03/03/20 1000 Oral     SpO2 03/03/20 1000 98 %     Weight 03/03/20 1001 138 lb 14.2 oz (63 kg)     Height 03/03/20 1001 5\' 2"  (1.575 m)     Head Circumference --      Peak Flow --      Pain Score 03/03/20 1001 0     Pain Loc --      Pain Edu? --      Excl. in [place]? --      Physical Exam Vitals and nursing note reviewed.  Constitutional:      General: Mariah Thornton is not in acute distress.    Appearance: Mariah Thornton is well-developed.  HENT:     Head: Normocephalic and atraumatic.  Eyes:  Conjunctiva/sclera: Conjunctivae normal.  Cardiovascular:     Rate and Rhythm: Normal rate and regular rhythm.     Heart sounds: Normal heart sounds. No murmur heard.  No friction rub.  Pulmonary:     Effort: Pulmonary effort is normal. No respiratory distress.     Breath sounds: Normal breath sounds. No wheezing or rales.  Abdominal:     General: There is no distension.     Palpations: Abdomen is soft.     Tenderness: There is no abdominal tenderness.  Musculoskeletal:     Cervical back: Neck supple.  Skin:    General: Skin is warm.     Capillary Refill: Capillary refill takes less than 2 seconds.     Comments: Multiple soft,  non-tender soft tissue masses appreciated, largest over the upper neck.  Neurological:     Mental Status: Mariah Thornton is alert and oriented to person, place, and time.     Motor: No abnormal muscle tone.       ____________________________________________   LABS (all labs ordered are listed, but only abnormal results are displayed)  Labs Reviewed  BASIC METABOLIC PANEL - Abnormal; Notable for the following components:      Result Value   Calcium 12.3 (*)    GFR calc non Af Amer 58 (*)    All other components within normal limits  CBC - Abnormal; Notable for the following components:   RBC 3.37 (*)    Hemoglobin 8.8 (*)    HCT 28.5 (*)    RDW 15.9 (*)    All other components within normal limits  URINALYSIS, COMPLETE (UACMP) WITH MICROSCOPIC - Abnormal; Notable for the following components:   Color, Urine YELLOW (*)    APPearance CLOUDY (*)    Hgb urine dipstick SMALL (*)    Protein, ur 100 (*)    All other components within normal limits  HEPATIC FUNCTION PANEL - Abnormal; Notable for the following components:   Albumin 2.9 (*)    All other components within normal limits  SARS CORONAVIRUS 2 BY RT PCR (HOSPITAL ORDER, [facility])  TSH  T4, FREE  PARATHYROID HORMONE, INTACT (NO CA)  PTH-RELATED PEPTIDE  CBC  BASIC METABOLIC PANEL  URINE DRUG SCREEN, QUALITATIVE ([facility] ONLY)  CBG MONITORING, ED    ____________________________________________  EKG: Normal sinus rhythm, VR 80. PR 142, QRS 82, QTc 401. No acute ST elevations or depressions. No ischemia or infarct. ________________________________________  RADIOLOGY All imaging, including plain films, CT scans, and ultrasounds, independently reviewed by me, and interpretations confirmed via formal radiology reads.  ED MD interpretation:   CXR: Left lower lobe nodule noted  Official radiology report(s): DG Chest 2 View  Result Date: 03/03/2020 CLINICAL DATA:  Weakness, syncope EXAM: CHEST - 2 VIEW  COMPARISON:  02/26/2020 FINDINGS: Right lung is grossly clear. Left lower lobe 2.5 cm suspected lung nodule. Previously noted possible left upper lobe lung nodule is not well seen today. Stable cardiomediastinal silhouette. No pneumothorax. Trace pleural effusions. IMPRESSION: 1. Trace pleural effusions. 2. Left lower lobe 2.5 cm suspected lung nodule. Chest CT is recommended for further evaluation. Electronically Signed   By: Donavan Foil M.D.   On: 03/03/2020 16:40   CT CHEST WO CONTRAST  Result Date: 03/03/2020 CLINICAL DATA:  Lung nodule, hypercalcemia EXAM: CT CHEST WITHOUT CONTRAST TECHNIQUE: Multidetector CT imaging of the chest was performed following the standard protocol without IV contrast. COMPARISON:  None. FINDINGS: Cardiovascular: Cardiac size is within normal limits. No  significant coronary artery calcification. No pericardial effusion. The central pulmonary arteries are enlarged in keeping with changes of pulmonary arterial hypertension. The thoracic aorta is unremarkable. Mediastinum/Nodes: There is pathologic right paratracheal adenopathy with a a single lymph node measuring 15 mm x 20 mm at axial image # 54/2. Esophagus unremarkable. Thyroid unremarkable. Lungs/Pleura: There are multiple pulmonary nodules identified within the lungs bilaterally demonstrating a basilar predominance. The nodule noted on prior chest radiograph represents the dominant nodule measuring 24 mm x 24 mm at axial image # 101/3. There are at least 20 such nodule seen scattered throughout the lungs. Small right pleural effusion is present. The appearance and distribution are suspicious for metastatic disease. A primary pulmonary mass with intra pulmonary metastases is considered less likely. Small right pleural effusion is present. No pneumothorax. No central obstructing mass. Upper Abdomen: Cholelithiasis. There is nodular enlargement of the right adrenal gland, suspicious for metastatic involvement. There is  pathologic periaortic and retrocrural adenopathy with the index lymph node measuring 13 mm in short axis diameter at axial image # 128/2 Musculoskeletal: There are 3 possible subcentimeter lytic lesions identified within the visualized portion of the L2 vertebral body involving the anterior aspect the vertebral body as well as the right pedicle. This is incompletely included on this examination and not well assessed. No other lytic or blastic bone lesion is identified. 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 Possible lytic lesion within the L2 vertebral body. The organ of origin is not clearly identified on this examination. Small right pleural effusion is nonspecific though a malignant pleural effusion must be considered. Completion examination of the abdomen and pelvis with contrast and possible PET CT examination may be helpful for further evaluation. Cholelithiasis. Enlarged central pulmonary arteries in keeping with changes of pulmonary arterial hypertension. Electronically Signed   By: Fidela Salisbury MD   On: 03/03/2020 19:17    ____________________________________________  PROCEDURES   Procedure(s) performed (including Critical Care):  Procedures  ____________________________________________  INITIAL IMPRESSION / MDM / [place] / ED COURSE  As part of my medical decision making, I reviewed the following data within the [place] notes reviewed and incorporated, Old chart reviewed, Notes from prior ED visits, and [place] Controlled Substance Database       *GLADIES SOFRANKO was evaluated in Emergency Department on 03/04/2020 for the symptoms described in the history of present illness. Mariah Thornton was evaluated in the context of the global  COVID-19 pandemic, which necessitated consideration that the patient might be at risk for infection with the SARS-CoV-2 virus that causes COVID-19. Institutional protocols and algorithms that pertain to the evaluation of patients at risk for COVID-19 are in a state of rapid change based on information released by regulatory bodies including the CDC and federal and state organizations. These policies and algorithms were followed during the patient's care in the ED.  Some ED evaluations and interventions may be delayed as a result of limited staffing during the pandemic.*     Medical Decision Making:  74 yo F here with increasing confusion, weakness. Pt overall non-toxic on exam but does appear slightly confused at times. Mariah Thornton was recently tx for PNA, and from a resp standpoint seems improved. Labs, however, are most notable for increasing Ca to 12.3, with ca oxalate crystals now noted on UA.  Pt does have +psychiatric disturbances, some stomach cramping, and now hypercalcemia with new soft tissue masses/lesions. Concern for possible symptomatic hypercalcemia from malignancy. Will start fluids and admit. Ca>13 when adjusting for albumin. Discussed my concern for possible malignancy with pt, friend.   ____________________________________________  FINAL CLINICAL IMPRESSION(S) / ED DIAGNOSES  Final diagnoses:  Weakness  Hypercalcemia     MEDICATIONS GIVEN DURING THIS VISIT:  Medications  enoxaparin (LOVENOX) injection 40 mg (40 mg Subcutaneous Given 03/04/20 0143)  sodium chloride flush (NS) 0.9 % injection 3 mL (3 mLs Intravenous Given 03/04/20 0143)  acetaminophen (TYLENOL) tablet 650 mg (has no administration in time range)    Or  acetaminophen (TYLENOL) suppository 650 mg (has no administration in time range)  bisacodyl (DULCOLAX) EC tablet 10 mg (has no administration in time range)  ondansetron (ZOFRAN) tablet 4 mg (has no administration in time range)    Or  ondansetron (ZOFRAN) injection 4  mg (has no administration in time range)  docusate sodium (COLACE) capsule 200 mg (200 mg Oral Given 03/04/20 0143)  zolendronic acid (ZOMETA) 4 mg in sodium chloride 0.9 % 100 mL IVPB (4 mg Intravenous New Bag/Given 03/04/20 0143)  0.9 %  sodium chloride infusion (has no administration in time range)  traMADol (ULTRAM) tablet 50 mg (has no administration in time range)  morphine 2 MG/ML injection 1 mg (has no administration in time range)  sodium chloride 0.9 % bolus 1,000 mL (1,000 mLs Intravenous New Bag/Given 03/03/20 1755)     ED Discharge Orders    None       Note:  This document was prepared using Dragon voice recognition software and may include unintentional dictation errors.   Duffy Bruce, MD 03/04/20 [phone]

## 2020-03-03 NOTE — ED Triage Notes (Signed)
Presents with weakness   States she is having periods of "going out"  Was seen last week for same  But states she is worse

## 2020-03-03 NOTE — ED Notes (Signed)
Patient transported to X-ray 

## 2020-03-04 ENCOUNTER — Inpatient Hospital Stay: Payer: Medicare Other

## 2020-03-04 ENCOUNTER — Encounter: Payer: Self-pay | Admitting: Internal Medicine

## 2020-03-04 DIAGNOSIS — D49511 Neoplasm of unspecified behavior of right kidney: Secondary | ICD-10-CM

## 2020-03-04 DIAGNOSIS — M8950 Osteolysis, unspecified site: Secondary | ICD-10-CM

## 2020-03-04 DIAGNOSIS — R918 Other nonspecific abnormal finding of lung field: Secondary | ICD-10-CM

## 2020-03-04 DIAGNOSIS — N2889 Other specified disorders of kidney and ureter: Secondary | ICD-10-CM

## 2020-03-04 DIAGNOSIS — R911 Solitary pulmonary nodule: Secondary | ICD-10-CM

## 2020-03-04 LAB — BASIC METABOLIC PANEL
Anion gap: 7 (ref 5–15)
BUN: 12 mg/dL (ref 8–23)
CO2: 24 mmol/L (ref 22–32)
Calcium: 11.9 mg/dL — ABNORMAL HIGH (ref 8.9–10.3)
Chloride: 108 mmol/L (ref 98–111)
Creatinine, Ser: 0.85 mg/dL (ref 0.44–1.00)
GFR calc Af Amer: >60 mL/min (ref >60)
GFR calc non Af Amer: >60 mL/min (ref >60)
Glucose, Bld: 81 mg/dL (ref 70–99)
Potassium: 4.3 mmol/L (ref 3.5–5.1)
Sodium: 139 mmol/L (ref 135–145)

## 2020-03-04 LAB — URINE DRUG SCREEN, QUALITATIVE (ARMC ONLY)
Amphetamines, Ur Screen: NOT DETECTED
Barbiturates, Ur Screen: NOT DETECTED
Benzodiazepine, Ur Scrn: NOT DETECTED
Cannabinoid 50 Ng, Ur ~~LOC~~: NOT DETECTED
Cocaine Metabolite,Ur ~~LOC~~: NOT DETECTED
MDMA (Ecstasy)Ur Screen: NOT DETECTED
Methadone Scn, Ur: NOT DETECTED
Opiate, Ur Screen: NOT DETECTED
Phencyclidine (PCP) Ur S: NOT DETECTED
Tricyclic, Ur Screen: NOT DETECTED

## 2020-03-04 LAB — CBC
HCT: 28.6 % — ABNORMAL LOW (ref 36.0–46.0)
Hemoglobin: 8.4 g/dL — ABNORMAL LOW (ref 12.0–15.0)
MCH: 25.7 pg — ABNORMAL LOW (ref 26.0–34.0)
MCHC: 29.4 g/dL — ABNORMAL LOW (ref 30.0–36.0)
MCV: 87.5 fL (ref 80.0–100.0)
Platelets: 185 10*3/uL (ref 150–400)
RBC: 3.27 MIL/uL — ABNORMAL LOW (ref 3.87–5.11)
RDW: 15.7 % — ABNORMAL HIGH (ref 11.5–15.5)
WBC: 4.5 10*3/uL (ref 4.0–10.5)
nRBC: 0 % (ref 0.0–0.2)

## 2020-03-04 IMAGING — CT CT ABD-PELV W/ CM
2 of 5 series · 15 of 46 positions shown, 17 images · IV contrast (APPLIED)
Comparison: CT chest, [DATE]

CLINICAL DATA: Pulmonary nodules, metastatic disease suspected,
primary malignancy search and staging

EXAM:
CT ABDOMEN AND PELVIS WITH CONTRAST
TECHNIQUE: Multidetector CT imaging of the abdomen and pelvis was performed
using the standard protocol following bolus administration of
intravenous contrast.
CONTRAST:  75mL OMNIPAQUE IOHEXOL 300 MG/ML  SOLN

[Series 2: axial st · axial · 0.73mm/px · z∈[-410,+0]mm · 12 of 94 slices shown, 14 images]
[im 6/94  soft-tissue]
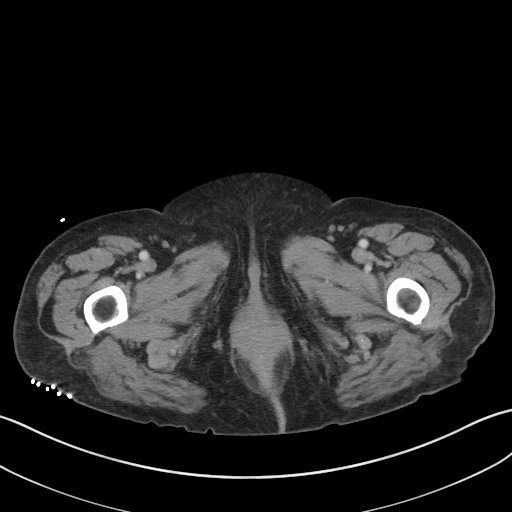
[im 6/94  bone]
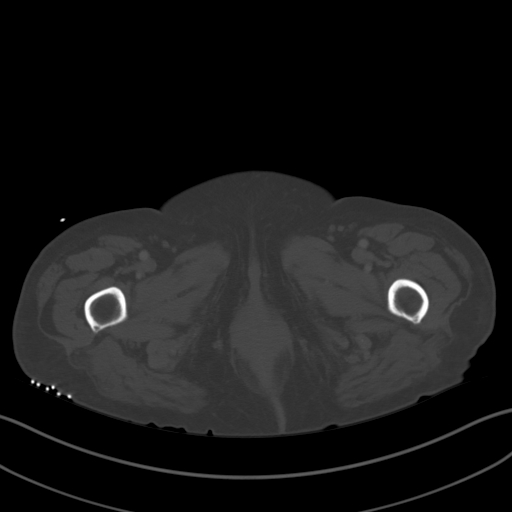
[im 17/94  soft-tissue]
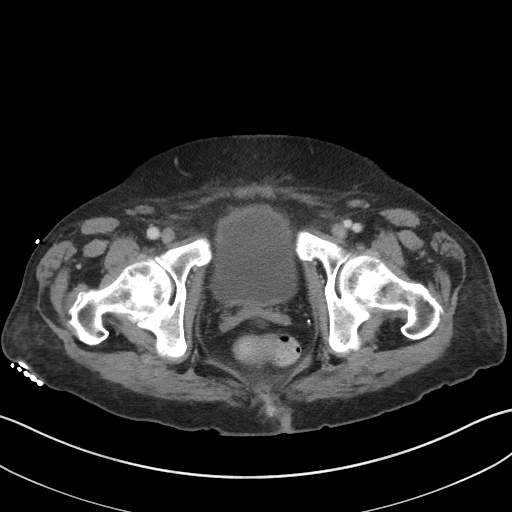
[im 22/94  soft-tissue]
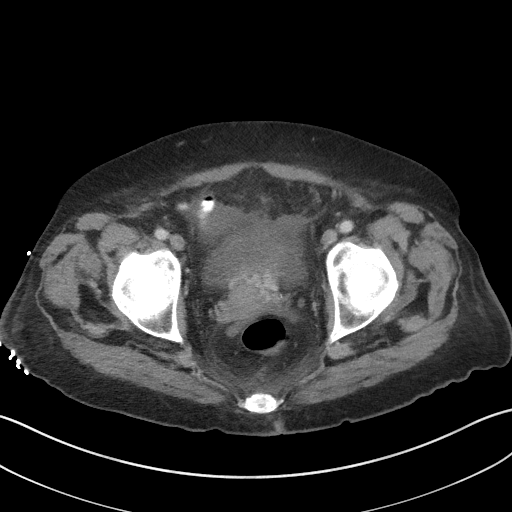
[im 28/94  soft-tissue]
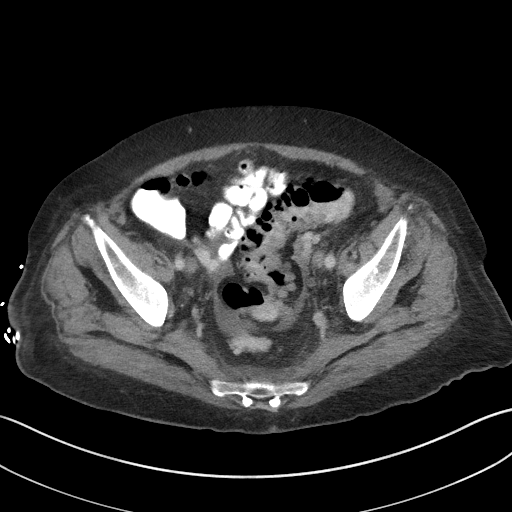
[im 39/94  soft-tissue]
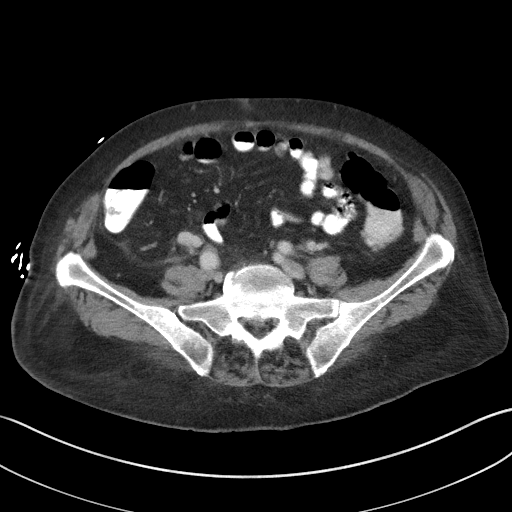
[im 44/94  soft-tissue]
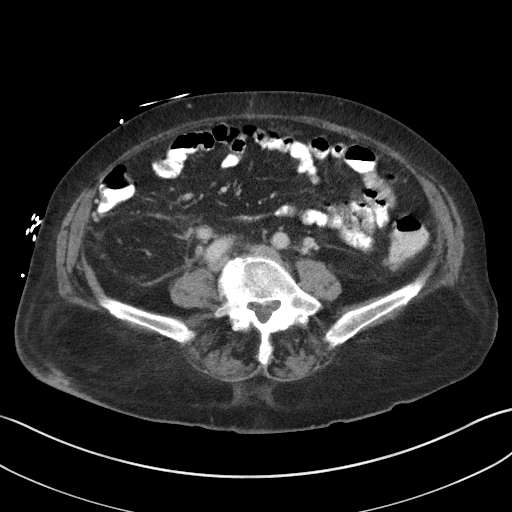
[im 50/94  soft-tissue]
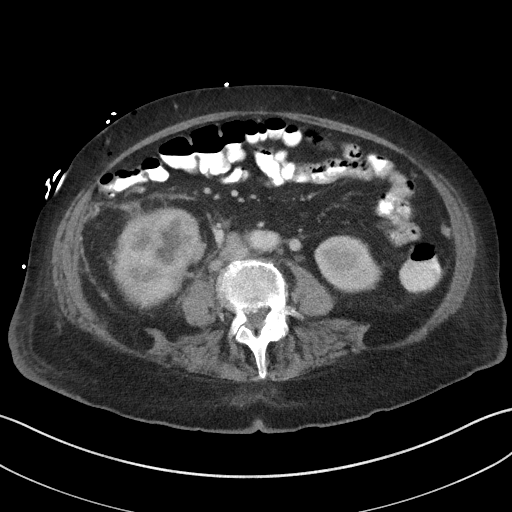
[im 61/94  soft-tissue]
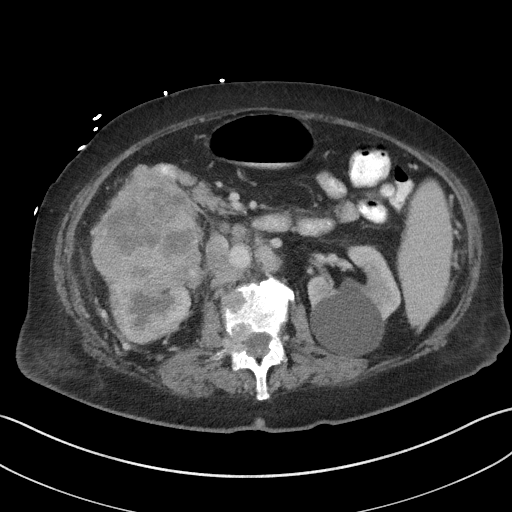
[im 66/94  soft-tissue]
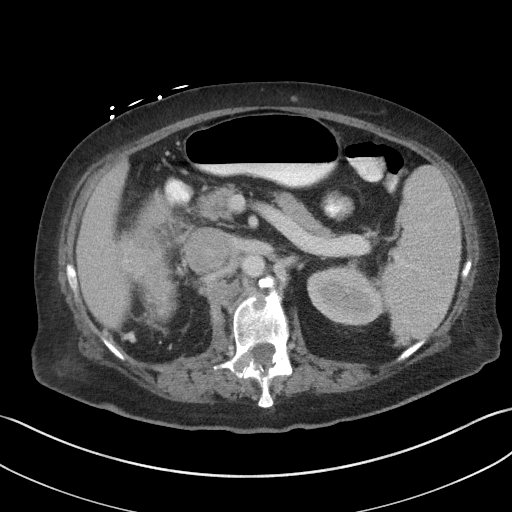
[im 66/94  bone]
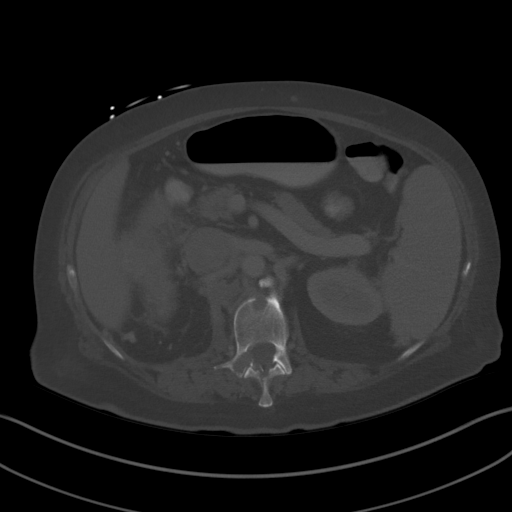
[im 72/94  soft-tissue]
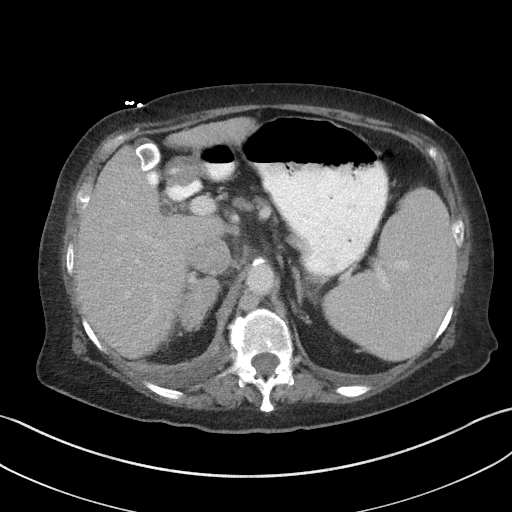
[im 83/94  soft-tissue]
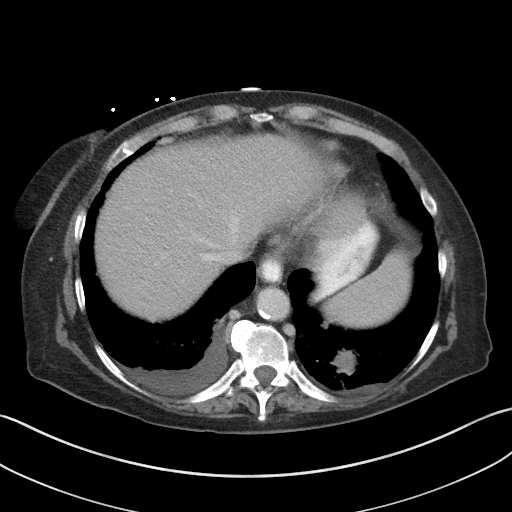
[im 88/94  soft-tissue]
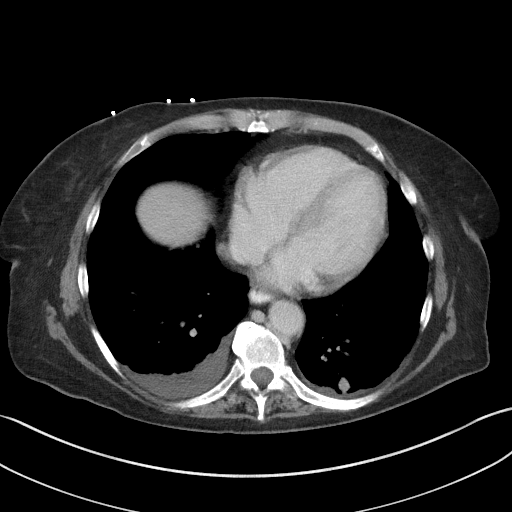

[Series 5: coronal st · coronal · 0.75mm/px · 3 of 88 slices shown]
[im 30/88  soft-tissue]
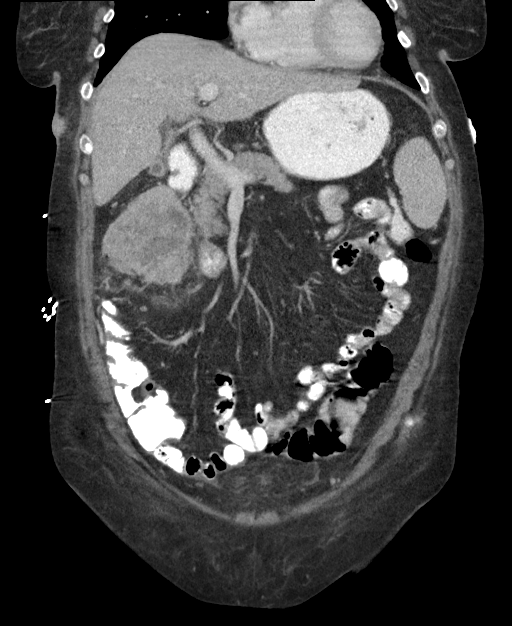
[im 39/88  soft-tissue]
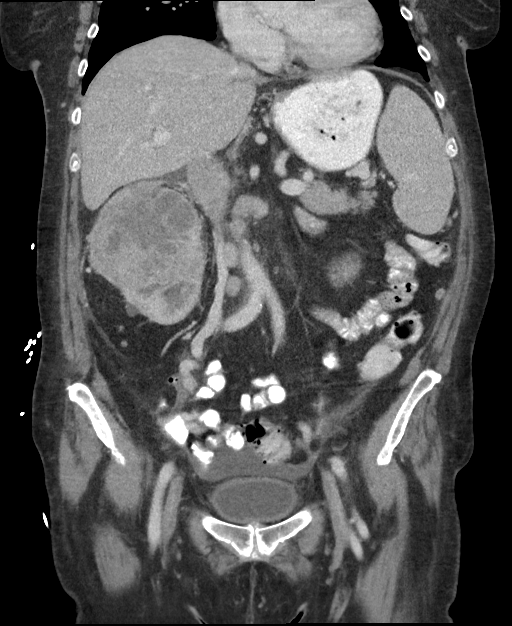
[im 49/88  soft-tissue]
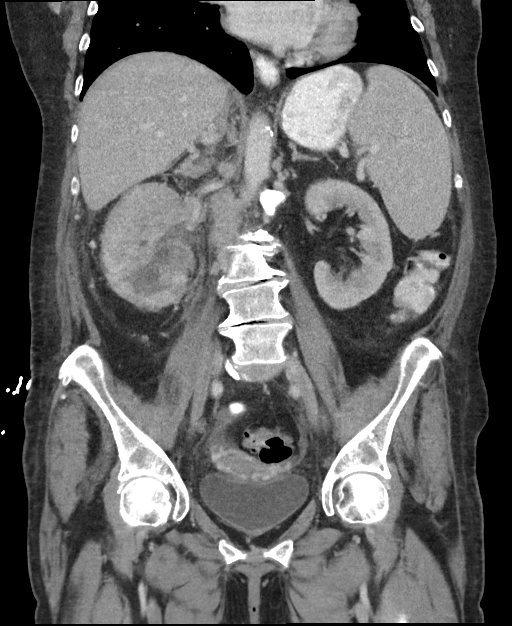

[15 of 46 positions shown; findings below may reference images not displayed]

FINDINGS: Lower chest: Small right, trace left pleural effusions and multiple
bilateral pulmonary nodules, better evaluated by recent prior CT of
the chest.

Hepatobiliary: Somewhat shrunken, coarse and nodular contour of the
liver. Gallstones. No gallbladder wall thickening, or biliary
dilatation.

Pancreas: Unremarkable. No pancreatic ductal dilatation or
surrounding inflammatory changes.

Spleen: Mild splenomegaly, maximum coronal span 13.9 cm.

Adrenals/Urinary Tract: Adrenal glands are unremarkable. There is a
large, heterogeneous mass arising from the anterior right kidney
measuring approximately 8.8 x 8.5 x 7.5 cm (series 5, image 40).
Bladder is unremarkable.

Stomach/Bowel: Stomach is within normal limits. Appendix appears
normal. No evidence of bowel wall thickening, distention, or
inflammatory changes.

Vascular/Lymphatic: Aortic atherosclerosis. Numerous enlarged
retroperitoneal lymph nodes measuring up to 2.6 x 1.2 cm (series 2,
image 32).

Reproductive: No mass or other significant abnormality.

Other: No abdominal wall hernia or abnormality. Trace ascites
throughout the abdomen and pelvis.

Musculoskeletal: There are multiple lytic osseous lesions involving
the L2 and L3 vertebral bodies with clear cortical and medullary
destruction (series 2, image 34). There are multiple additional more
subtle osseous lucencies throughout the spine and pelvis, which are
suspicious for very subtle osseous metastatic disease.
IMPRESSION: 1. There is a large, heterogeneous mass arising from the anterior
right kidney measuring approximately 8.8 x 8.5 x 7.5 cm. Numerous
enlarged retroperitoneal lymph nodes.
2. Multiple lytic osseous lesions involving the L2 and L3 vertebral
bodies with clear cortical and medullary destruction. There are
multiple additional more subtle osseous lucencies throughout the
spine and pelvis, which are suspicious for very subtle osseous
metastatic disease.
3. Small right, trace left pleural effusions and multiple bilateral
pulmonary nodules, better evaluated by recent prior CT of the chest.
4. Overall constellation of findings is consistent with renal cell
carcinoma and widespread metastatic disease.
5. Stigmata of cirrhosis and splenomegaly.
6. Trace ascites.
7. Cholelithiasis.
8. Aortic Atherosclerosis ([7X]-[7X]).

## 2020-03-04 MED ORDER — IOHEXOL 300 MG/ML  SOLN
75.0000 mL | Freq: Once | INTRAMUSCULAR | Status: AC | PRN
  Administered 2020-03-04: 75 mL via INTRAVENOUS
  Filled 2020-03-04: qty 75

## 2020-03-04 MED ORDER — IOHEXOL 9 MG/ML PO SOLN
500.0000 mL | ORAL | Status: AC
  Administered 2020-03-04: 500 mL via ORAL
  Filled 2020-03-04 (×2): qty 500

## 2020-03-04 NOTE — Progress Notes (Signed)
PROGRESS NOTE    Mariah Thornton  WRU:[id number] DOB: 1945/12/24 DOA: 03/03/2020 PCP: Patient, No Pcp Per   Assessment & Plan:   Active Problems:   Hypercalcemia  Right kidney mass: w/ mets to bone & lung likely. 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 Will consult oncology   Left lung nodule: 2.5 cm on CXR. CT chest order to further evaluate. Possible malignancy  Hypercalcemia: will continue on IVFs. S/p IV zolendronic acid. Trending down but still elevated.  Etiology unclear, possible secondary to underlying malignancy  Likely cirrhosis: w/ trace ascites & splenomegaly as per CT scan.   Normocytic anemia: etiology unclear. No need for a transfusion at this time. Will continue to monitor   Back pain: etiology unclear, possibly secondary to underlying malignancy. Tramadol, morphine prn for pain   Difficulty ambulating: PT/OT recs home health      DVT prophylaxis: lovenox Code Status: full  Family Communication: discussed pt's care w/ pt's husband who is at bedside and answered his questions Disposition Plan: likely home health    Consultants:  Onco   Procedures:    Antimicrobials:    Subjective: Pt c/o poor appetite   Objective: Vitals:   03/03/20 1557 03/03/20 1800 03/03/20 1830 03/04/20 0755  BP: (!) 148/58 (!) 148/56 (!) 140/50 (!) 145/64  Pulse: 78 80 83 78  Resp: 16 18 (!) 33 (!) 21  Temp:      TempSrc:      SpO2: 98% 100% 96% 100%  Weight:      Height:        Intake/Output Summary (Last 24 hours) at 03/04/2020 0807 Last data filed at 03/04/2020 0744 Gross per 24 hour  Intake 105 ml  Output --  Net 105 ml   Filed Weights   03/03/20 1001  Weight: 63 kg    Examination:  General exam: Appears calm and comfortable  Respiratory system: diminished breath sounds b/l. No rales  Cardiovascular system: S1 & S2 +. No rubs, gallops or clicks.  Gastrointestinal system: Abdomen is nondistended, soft and nontender. Normal bowel sounds heard. Central  nervous system: Alert and oriented. Moves all 4 extremities  Psychiatry: Judgement and insight appear normal. Flat mood and affect    Data Reviewed: I have personally reviewed following labs and imaging studies  CBC: Recent Labs  Lab 02/26/20 1342 03/03/20 1017 03/04/20 0422  WBC 6.6 5.9 4.5  HGB 8.6* 8.8* 8.4*  HCT 29.0* 28.5* 28.6*  MCV 87.6 84.6 87.5  PLT 176 198 914   Basic Metabolic Panel: Recent Labs  Lab 02/26/20 1342 03/03/20 1017 03/04/20 0422  NA 134* 137 139  K 4.2 4.7 4.3  CL 103 103 108  CO2 22 23 24   GLUCOSE 106* 94 81  BUN 18 12 12   CREATININE 1.04* 0.96 0.85  CALCIUM 11.5* 12.3* 11.9*   GFR: Estimated Creatinine Clearance: 49.9 mL/min (by C-G formula based on SCr of 0.85 mg/dL). Liver Function Tests: Recent Labs  Lab 03/03/20 1017  AST 26  ALT 12  ALKPHOS 109  BILITOT 0.7  PROT 7.6  ALBUMIN 2.9*   No results for input(s): LIPASE, AMYLASE in the last 168 hours. No results for input(s): AMMONIA in the last 168 hours. Coagulation Profile: No results for input(s): INR, PROTIME in the last 168 hours. Cardiac Enzymes: No results for input(s): CKTOTAL, CKMB, CKMBINDEX, TROPONINI in the last 168 hours. BNP (last 3 results) No results for input(s): PROBNP in the last 8760 hours. HbA1C: No results for input(s): HGBA1C  in the last 72 hours. CBG: No results for input(s): GLUCAP in the last 168 hours. Lipid Profile: No results for input(s): CHOL, HDL, LDLCALC, TRIG, CHOLHDL, LDLDIRECT in the last 72 hours. Thyroid Function Tests: Recent Labs    03/03/20 1744  TSH 3.891  FREET4 1.03   Anemia Panel: No results for input(s): VITAMINB12, FOLATE, FERRITIN, TIBC, IRON, RETICCTPCT in the last 72 hours. Sepsis Labs: No results for input(s): PROCALCITON, LATICACIDVEN in the last 168 hours.  Recent Results (from the past 240 hour(s))  SARS Coronavirus 2 by RT PCR (hospital order, performed in [facility] hospital lab) Nasopharyngeal Nasopharyngeal  Swab     Status: None   Collection Time: 03/03/20  5:44 PM   Specimen: Nasopharyngeal Swab  Result Value Ref Range Status   SARS Coronavirus 2 NEGATIVE NEGATIVE Final    Comment: (NOTE) SARS-CoV-2 target nucleic acids are NOT DETECTED.  The SARS-CoV-2 RNA is generally detectable in upper and lower respiratory specimens during the acute phase of infection. The lowest concentration of SARS-CoV-2 viral copies this assay can detect is 250 copies / mL. A negative result does not preclude SARS-CoV-2 infection and should not be used as the sole basis for treatment or other patient management decisions.  A negative result may occur with improper specimen collection / handling, submission of specimen other than nasopharyngeal swab, presence of viral mutation(s) within the areas targeted by this assay, and inadequate number of viral copies (<250 copies / mL). A negative result must be combined with clinical observations, patient history, and epidemiological information.  Fact Sheet for Patients:   StrictlyIdeas.no  Fact Sheet for Healthcare Providers: BankingDealers.co.za  This test is not yet approved or  cleared by the [place] FDA and has been authorized for detection and/or diagnosis of SARS-CoV-2 by FDA under an Emergency Use Authorization (EUA).  This EUA will remain in effect (meaning this test can be used) for the duration of the COVID-19 declaration under Section 564(b)(1) of the Act, 21 U.S.C. section 360bbb-3(b)(1), unless the authorization is terminated or revoked sooner.  Performed at [facility], [address] [place], [place] [address]          Radiology Studies: DG Chest 2 View  Result Date: 03/03/2020 CLINICAL DATA:  Weakness, syncope EXAM: CHEST - 2 VIEW COMPARISON:  02/26/2020 FINDINGS: Right lung is grossly clear. Left lower lobe 2.5 cm suspected lung nodule. Previously noted possible left upper  lobe lung nodule is not well seen today. Stable cardiomediastinal silhouette. No pneumothorax. Trace pleural effusions. IMPRESSION: 1. Trace pleural effusions. 2. Left lower lobe 2.5 cm suspected lung nodule. Chest CT is recommended for further evaluation. Electronically Signed   By: Donavan Foil M.D.   On: 03/03/2020 16:40   CT CHEST WO CONTRAST  Result Date: 03/03/2020 CLINICAL DATA:  Lung nodule, hypercalcemia EXAM: CT CHEST WITHOUT CONTRAST TECHNIQUE: Multidetector CT imaging of the chest was performed following the standard protocol without IV contrast. COMPARISON:  None. FINDINGS: Cardiovascular: Cardiac size is within normal limits. No significant coronary artery calcification. No pericardial effusion. The central pulmonary arteries are enlarged in keeping with changes of pulmonary arterial hypertension. The thoracic aorta is unremarkable. Mediastinum/Nodes: There is pathologic right paratracheal adenopathy with a a single lymph node measuring 15 mm x 20 mm at axial image # 54/2. Esophagus unremarkable. Thyroid unremarkable. Lungs/Pleura: There are multiple pulmonary nodules identified within the lungs bilaterally demonstrating a basilar predominance. The nodule noted on prior chest radiograph represents the dominant nodule measuring 24 mm  x 24 mm at axial image # 101/3. There are at least 20 such nodule seen scattered throughout the lungs. Small right pleural effusion is present. The appearance and distribution are suspicious for metastatic disease. A primary pulmonary mass with intra pulmonary metastases is considered less likely. Small right pleural effusion is present. No pneumothorax. No central obstructing mass. Upper Abdomen: Cholelithiasis. There is nodular enlargement of the right adrenal gland, suspicious for metastatic involvement. There is pathologic periaortic and retrocrural adenopathy with the index lymph node measuring 13 mm in short axis diameter at axial image # 128/2 Musculoskeletal:  There are 3 possible subcentimeter lytic lesions identified within the visualized portion of the L2 vertebral body involving the anterior aspect the vertebral body as well as the right pedicle. This is incompletely included on this examination and not well assessed. No other lytic or blastic bone lesion is identified. 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 Possible lytic lesion within the L2 vertebral body. The organ of origin is not clearly identified on this examination. Small right pleural effusion is nonspecific though a malignant pleural effusion must be considered. Completion examination of the abdomen and pelvis with contrast and possible PET CT examination may be helpful for further evaluation. Cholelithiasis. Enlarged central pulmonary arteries in keeping with changes of pulmonary arterial hypertension. Electronically Signed   By: Fidela Salisbury MD   On: 03/03/2020 19:17        Scheduled Meds: . docusate sodium  200 mg Oral BID  . enoxaparin (LOVENOX) injection  40 mg Subcutaneous Q24H  . sodium chloride flush  3 mL Intravenous Q12H   Continuous Infusions: . sodium chloride       LOS: 1 day    Time spent: 35 mins     Wyvonnia Dusky, MD [facility] Pager [phone]  If 7PM-7AM, please contact night-coverage [email or web address] 03/04/2020, 8:07 AM

## 2020-03-04 NOTE — ED Notes (Signed)
Attempted to call report to Maudie Mercury, [place], RN is in a procedure was asked to call back

## 2020-03-04 NOTE — ED Notes (Signed)
Pt assisted to BR with wheelchair and 1 assist.

## 2020-03-04 NOTE — Evaluation (Signed)
Occupational Therapy Evaluation Patient Details Name: Mariah Thornton MRN: [id number] DOB: [birth year] Today's Date: 03/04/2020    History of Present Illness Pt is a 74 yo female that presented to ED from [facility] for back pain, weakness, potential confusion, as well as difficulty walking. Was recently seen for similar complaints, diagnosed with PNA and sent home on antibiotics. Workup here showed hypercalcemia (currently 11.9).   Clinical Impression   [name] was seen for OT evaluation this date. Prior to hospital admission, pt was Independent c mobility and ADLs reporting supervision for bathing and endorses furniture walking in home. Pt lives c husband in [facility], endorses falls history. Pt presents to acute OT demonstrating impaired ADL performance and functional mobility 2/2 decreased safety awareness, functional strength/balance deficits, and decreased activity tolerance. Pt currently requires CGA + RW for toilet t/f - SBA perihygieme and clothing mgmt in standing. SBA + RW hand washing standing sinkside - VCs for safety/RW use. Pt demonstrates poor carryover of safe RW technique. Pt would benefit from skilled OT to address noted impairments and functional limitations (see below for any additional details) in order to maximize safety and independence while minimizing falls risk and caregiver burden. Upon hospital discharge, recommend HHOT to maximize pt safety and return to functional independence during meaningful occupations of daily life.     Follow Up Recommendations  Home health OT    Equipment Recommendations  None recommended by OT    Recommendations for Other Services       Precautions / Restrictions Precautions Precautions: Fall Restrictions Weight Bearing Restrictions: No      Mobility Bed Mobility Overal bed mobility: Modified Independent    Transfers Overall transfer level: Needs assistance Equipment used: Rolling walker (2 wheeled) Transfers: Sit to/from  Stand Sit to Stand: Min guard     General transfer comment: CGA + RW + VCs for safe RW technique    Balance Overall balance assessment: Needs assistance Sitting-balance support: Feet supported Sitting balance-Leahy Scale: Good     Standing balance support: No upper extremity supported;During functional activity Standing balance-Leahy Scale: Fair Standing balance comment: wide BOS for hand washing       ADL either performed or assessed with clinical judgement   ADL Overall ADL's : Needs assistance/impaired      General ADL Comments: CGA + RW for toilet t/f - SBA perihygieme and clothing mgmt in standing. SBA + RW hand washing standing sinkside - VCs for safety/RW use.       Pertinent Vitals/Pain Pain Assessment: No/denies pain     Hand Dominance Right   Extremity/Trunk Assessment Upper Extremity Assessment Upper Extremity Assessment: Overall WFL for tasks assessed   Lower Extremity Assessment Lower Extremity Assessment: Generalized weakness   Cervical / Trunk Assessment Cervical / Trunk Assessment: Normal   Communication Communication Communication: HOH   Cognition Arousal/Alertness: Awake/alert Behavior During Therapy: WFL for tasks assessed/performed Overall Cognitive Status: Within Functional Limits for tasks assessed      General Comments: Answers PLOF appropriately   General Comments  unable to get read on pulse-ox (pt fidgeting c fingers)    Exercises Exercises: Other exercises Other Exercises Other Exercises: Pt and husband educated re: OT role, DME recs, d/c recs, importance of supervision for mobility, RW techniques Other Exercises: Toileting, hand washing, sup<>sit, sit<>stand, sitting/standing balance/tolerance, ~100 ft mobility     Home Living Family/patient expects to be discharged to:: Private residence Living Arrangements: Spouse/significant other Available Help at Discharge: Family Type of Home: House Home Access: Stairs  to  enter Entrance Stairs-Number of Steps: 3 in front, 1 in back, no handrails on back   Home Layout: One level     Bathroom Shower/Tub: Occupational psychologist: Standard Bathroom Accessibility: Yes   Home Equipment: None   Additional Comments: a couple of falls, falls backwards majority of falls, says she loses her balance      Prior Functioning/Environment Level of Independence: Independent        Comments: some supervision for bathing, participates in cooking/cleaning. Often walks while holding onto her husband/furniture        OT Problem List: Decreased strength;Decreased activity tolerance;Impaired balance (sitting and/or standing);Decreased safety awareness;Decreased knowledge of use of DME or AE      OT Treatment/Interventions: Self-care/ADL training;Therapeutic exercise;Energy conservation;DME and/or AE instruction;Therapeutic activities;Patient/family education;Balance training    OT Goals(Current goals can be found in the care plan section) Acute Rehab OT Goals Patient Stated Goal: to go home OT Goal Formulation: With patient Time For Goal Achievement: 03/18/20 Potential to Achieve Goals: Good ADL Goals Pt Will Perform Grooming: with modified independence;standing (>10 mins c LRAD PRN) Pt Will Perform Lower Body Dressing: with modified independence;sit to/from stand (c LRAD PRN) Pt Will Transfer to Toilet: with modified independence;ambulating;regular height toilet (c LRAD PRN) Pt Will Perform Toileting - Clothing Manipulation and hygiene: with modified independence;sit to/from stand (c LRAD PRN)  OT Frequency: Min 1X/week    AM-PAC OT "6 Clicks" Daily Activity     Outcome Measure Help from another person eating meals?: None Help from another person taking care of personal grooming?: None Help from another person toileting, which includes using toliet, bedpan, or urinal?: A Little Help from another person bathing (including washing, rinsing, drying)?: A  Little Help from another person to put on and taking off regular upper body clothing?: None Help from another person to put on and taking off regular lower body clothing?: A Little 6 Click Score: 21   End of Session Equipment Utilized During Treatment: Rolling walker Nurse Communication: Mobility status  Activity Tolerance: Patient tolerated treatment well Patient left: in bed;with call bell/phone within reach;with bed alarm set;with family/visitor present;with nursing/sitter in room (MD in room at end of session)  OT Visit Diagnosis: Other abnormalities of gait and mobility (R26.89);History of falling (Z91.81)                Time: 6553-7482 OT Time Calculation (min): 11 min Charges:  OT General Charges $OT Visit: 1 Visit OT Evaluation $OT Eval Low Complexity: 1 Low OT Treatments $Self Care/Home Management : 8-22 mins  Dessie Coma, M.S. OTR/L  03/04/20, 10:50 AM  ascom [phone]

## 2020-03-04 NOTE — Consult Note (Signed)
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  Chief Complaint: Mariah Thornton is a 74 y.o. female with no significant past medical history who was admitted from the emergency room with hypercalcemia and a lung nodule.  HPI: The patient notes no medical history.  She denies any smoking history.  She presented to the emergency room with general weakness and lower back pain.  She denies any trauma.  She had lost about 6 pounds unintentionally over the past month.  She denies any nausea, vomiting or diarrhea.  She denies any hematuria.  Labs on 03/03/2020 included a hematocrit of 28.5, hemoglobin 8.8, MCV 84.6, platelets 198,000, WBC 5900.  Creatinine was 0.96, calcium 12.3 (corrected calcium 13.235), albumin 2.9, protein 7.6 and normal liver function tests.  TSH was 3.891 with a free T4 1.03. PTH and PTH-rp are pending.  The patient has received IV fluids and Zometa for her hypercalcemia.  CXR on 03/03/2020 revealed a 2.5 cm left lower lobe nodule.  Chest CT on 03/03/2020 revealed multiple pulmonary nodules, thoracic and retroperitoneal adenopathy and probable malignant involvement of the right adrenal gland suspicious for metastatic disease.  There was a possible lytic lesion within the L2 vertebral body.  Abdomen and pelvis CT scan today revealed an 8.8 x 8.5 x 7.5 cm heterogeneous mass arising from the anterior right kidney.  There were numerous and large retroperitoneal lymph nodes.  There are multiple lytic osseous lesions involving the L2 and L3 vertebral bodies with clear cortical and medullary destruction.  There are multiple additional subtle osseous lucencies throughout the spine and pelvis were suspicious for subtle metastatic disease.  There is a small right and trace left pleural  effusion and multiple bilateral pulmonary nodules.  Constellation of findings was consistent with renal cell carcinoma with widespread metastatic disease.  There was stigmata of cirrhosis and splenomegaly with trace ascites.  The patient states prior to admission, she fell off her couch.  Overall she feels "fine".  She denies any pain.  She is unaware of where she is today.  She does not know how she got here.  She states she is ready to go home.   History reviewed. No pertinent past medical history.  History reviewed. No pertinent surgical history.  No family history on file.  Social History:  reports that she has never smoked. She has never used smokeless tobacco. She reports previous alcohol use. She reports previous drug use.  The patient denies any exposure to radiation or toxins.  She states that she is retired.  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  Her husband's name is [name].  The patient lives in [place], [place].  She is alone today.  Allergies: No Known Allergies  Medications Prior to Admission  Medication Sig Dispense Refill  . amoxicillin-clavulanate (AUGMENTIN) 875-125 MG tablet Take 1 tablet by mouth 2 (two) times daily for 7 days. 14 tablet 0  . azithromycin (ZITHROMAX) 250 MG tablet TAKE 1 TABLET BY MOUTH DAILY FOR 4 DAYS      Review of Systems: GENERAL:  Feels "fine.  No fevers, sweats.  Unspecified weight loss in the past month. PERFORMANCE STATUS (ECOG):  2 HEENT:  No visual changes, runny nose, sore throat, mouth sores or tenderness. Lungs: No shortness of breath or cough.  No hemoptysis.  Cardiac:  No chest pain, palpitations, orthopnea, or PND. GI:  No nausea, vomiting, diarrhea, constipation, melena or hematochezia. GU:  No urgency, frequency, dysuria, or hematuria. Musculoskeletal:  Patient denies back pain (although low back pain noted on presentation).  No joint pain.  No muscle tenderness. Extremities:  Legs feel "stiff".  No pain or  swelling. Skin:  No rashes or skin changes. Neuro:  General weakness.  No headache, numbness or weakness, balance or coordination issues. Endocrine:  No diabetes, thyroid issues, hot flashes or night sweats. Psych:  No mood changes, depression or anxiety. Pain:  No focal pain. Review of systems:  All other systems reviewed and found to be negative.  Physical Exam:  Blood pressure (!) 165/65, pulse 89, temperature 97.9 F (36.6 C), temperature source Oral, resp. rate 20, height 5' 2"  (1.575 m), weight 139 lb 5.3 oz (63.2 kg), SpO2 96 %.  GENERAL:  Fatigued appearing woman lying comfortably on the medical unit in no acute distress. MENTAL STATUS:  Alert and oriented to person only.  She does not know where she is or how she got here.  She knows it is 2021. HEAD:  Short hair.  Normocephalic, atraumatic, face symmetric, no Cushingoid features. EYES:  Blue eyes.  Pupils equal round and reactive to light and accomodation.  No conjunctivitis or scleral icterus. ENT:  Oropharynx clear without lesion.  Tongue normal. Mucous membranes dry.  RESPIRATORY:  Clear to auscultation without rales, wheezes or rhonchi. CARDIOVASCULAR:  Regular rate and rhythm without murmur, rub or gallop. ABDOMEN:  Full right upper quadrant.  Liver palpable 1-2 FB below RCM.  Soft, non-tender, with active bowel sounds. SKIN:  No rashes, ulcers or lesions. EXTREMITIES: ICDs in place.  No edema, no skin discoloration or tenderness.  No palpable cords. LYMPH NODES: No palpable cervical, supraclavicular, axillary or inguinal adenopathy  NEUROLOGICAL: She is unaware of where she is.  She knows the year (2021) and the current president.  She knows her address but transposes numbers of her phone number.  She states there are 20 nickels in a quarter.  Lower extremity strength and sensation intact.  PSYCH:  Confused.   Results for orders placed or performed during the hospital encounter of 03/03/20 (from the past 48 hour(s))   Urinalysis, Complete w Microscopic Urine, Clean Catch     Status: Abnormal   Collection Time: 03/03/20 10:05 AM  Result Value Ref Range   Color, Urine YELLOW (A) YELLOW   APPearance CLOUDY (A) CLEAR   Specific Gravity, Urine 1.024 1.005 - 1.030   pH 5.0 5.0 - 8.0   Glucose, UA NEGATIVE NEGATIVE mg/dL   Hgb urine dipstick SMALL (A) NEGATIVE   Bilirubin Urine NEGATIVE NEGATIVE   Ketones, ur NEGATIVE NEGATIVE mg/dL   Protein, ur 100 (A) NEGATIVE mg/dL   Nitrite NEGATIVE NEGATIVE   Leukocytes,Ua NEGATIVE NEGATIVE   RBC / HPF 6-10 0 - 5 RBC/hpf   WBC, UA 6-10 0 - 5 WBC/hpf   Bacteria, UA NONE SEEN NONE SEEN   Squamous Epithelial / LPF 0-5 0 - 5   Mucus PRESENT    Hyaline Casts, UA PRESENT    Ca Oxalate Crys, UA PRESENT     Comment: Performed at [facility], [address], [place],  [address]  Basic metabolic panel     Status: Abnormal   Collection Time: 03/03/20 10:17 AM  Result Value Ref Range   Sodium 137 135 - 145 mmol/L   Potassium 4.7 3.5 - 5.1 mmol/L  Chloride 103 98 - 111 mmol/L   CO2 23 22 - 32 mmol/L   Glucose, Bld 94 70 - 99 mg/dL    Comment: Glucose reference range applies only to samples taken after fasting for at least 8 hours.   BUN 12 8 - 23 mg/dL   Creatinine, Ser 0.96 0.44 - 1.00 mg/dL   Calcium 12.3 (H) 8.9 - 10.3 mg/dL   GFR calc non Af Amer 58 (L) >60 mL/min   GFR calc Af Amer >60 >60 mL/min   Anion gap 11 5 - 15    Comment: Performed at [facility], [address] [place], [place] [address]  CBC     Status: Abnormal   Collection Time: 03/03/20 10:17 AM  Result Value Ref Range   WBC 5.9 4.0 - 10.5 K/uL   RBC 3.37 (L) 3.87 - 5.11 MIL/uL   Hemoglobin 8.8 (L) 12.0 - 15.0 g/dL   HCT 28.5 (L) 36 - 46 %   MCV 84.6 80.0 - 100.0 fL   MCH 26.1 26.0 - 34.0 pg   MCHC 30.9 30.0 - 36.0 g/dL   RDW 15.9 (H) 11.5 - 15.5 %   Platelets 198 150 - 400 K/uL   nRBC 0.0 0.0 - 0.2 %    Comment: Performed at [facility], [address] [place], [place] [address]  Hepatic function panel     Status: Abnormal   Collection Time: 03/03/20 10:17 AM  Result Value Ref Range   Total Protein 7.6 6.5 - 8.1 g/dL   Albumin 2.9 (L) 3.5 - 5.0 g/dL   AST 26 15 - 41 U/L   ALT 12 0 - 44 U/L   Alkaline Phosphatase 109 38 - 126 U/L   Total Bilirubin 0.7 0.3 - 1.2 mg/dL   Bilirubin, Direct 0.2 0.0 - 0.2 mg/dL   Indirect Bilirubin 0.5 0.3 - 0.9 mg/dL    Comment: Performed at [facility], [address], [place], [place] [address]  Urine Drug Screen, Qualitative ([place] only)     Status: None   Collection Time: 03/03/20 10:17 AM  Result Value Ref Range   Tricyclic, Ur Screen NONE DETECTED NONE DETECTED   Amphetamines, Ur Screen NONE DETECTED NONE DETECTED   MDMA (Ecstasy)Ur Screen NONE DETECTED NONE DETECTED   Cocaine Metabolite,Ur [place] NONE DETECTED NONE DETECTED   Opiate, Ur Screen NONE DETECTED NONE DETECTED   Phencyclidine (PCP) Ur S NONE DETECTED NONE DETECTED   Cannabinoid 50 Ng, Ur [place] NONE DETECTED NONE DETECTED   Barbiturates, Ur Screen NONE DETECTED NONE DETECTED   Benzodiazepine, Ur Scrn NONE DETECTED NONE DETECTED   Methadone Scn, Ur NONE DETECTED NONE DETECTED    Comment: (NOTE) Tricyclics + metabolites, urine    Cutoff 1000 ng/mL Amphetamines + metabolites, urine  Cutoff 1000 ng/mL MDMA (Ecstasy), urine              Cutoff 500 ng/mL Cocaine Metabolite, urine          Cutoff 300 ng/mL Opiate + metabolites, urine        Cutoff 300 ng/mL Phencyclidine (PCP), urine         Cutoff 25 ng/mL Cannabinoid, urine                 Cutoff 50 ng/mL Barbiturates + metabolites, urine  Cutoff 200 ng/mL Benzodiazepine, urine              Cutoff 200 ng/mL Methadone, urine  Cutoff 300 ng/mL  The urine drug screen provides only a preliminary, unconfirmed analytical test result and should not be used for non-medical purposes. Clinical consideration and professional judgment should be applied to any  positive drug screen result due to possible interfering substances. A more specific alternate chemical method must be used in order to obtain a confirmed analytical result. Gas chromatography / mass spectrometry (GC/MS) is the preferred confirm atory method. Performed at [facility], [address] [place], [place] [address]   TSH     Status: None   Collection Time: 03/03/20  5:44 PM  Result Value Ref Range   TSH 3.891 0.350 - 4.500 uIU/mL    Comment: Performed by a 3rd Generation assay with a functional sensitivity of <=0.01 uIU/mL. Performed at [facility], [address] [place], [place] [address]   T4, free     Status: None   Collection Time: 03/03/20  5:44 PM  Result Value Ref Range   Free T4 1.03 0.61 - 1.12 ng/dL    Comment: (NOTE) Biotin ingestion may interfere with free T4 tests. If the results are inconsistent with the TSH level, previous test results, or the clinical presentation, then consider biotin interference. If needed, order repeat testing after stopping biotin. Performed at [facility], [address] [place], [place] [address]   SARS Coronavirus 2 by RT PCR (hospital order, performed in [facility] hospital lab) Nasopharyngeal Nasopharyngeal Swab     Status: None   Collection Time: 03/03/20  5:44 PM   Specimen: Nasopharyngeal Swab  Result Value Ref Range   SARS Coronavirus 2 NEGATIVE NEGATIVE    Comment: (NOTE) SARS-CoV-2 target nucleic acids are NOT DETECTED.  The SARS-CoV-2 RNA is generally detectable in upper and lower respiratory specimens during the acute phase of infection. The lowest concentration of SARS-CoV-2 viral copies this assay can detect is 250 copies / mL. A negative result does not preclude SARS-CoV-2 infection and should not be used as the sole basis for treatment or other patient management decisions.  A negative result may occur with improper specimen collection / handling, submission of specimen  other than nasopharyngeal swab, presence of viral mutation(s) within the areas targeted by this assay, and inadequate number of viral copies (<250 copies / mL). A negative result must be combined with clinical observations, patient history, and epidemiological information.  Fact Sheet for Patients:   StrictlyIdeas.no  Fact Sheet for Healthcare Providers: BankingDealers.co.za  This test is not yet approved or  cleared by the [place] FDA and has been authorized for detection and/or diagnosis of SARS-CoV-2 by FDA under an Emergency Use Authorization (EUA).  This EUA will remain in effect (meaning this test can be used) for the duration of the COVID-19 declaration under Section 564(b)(1) of the Act, 21 U.S.C. section 360bbb-3(b)(1), unless the authorization is terminated or revoked sooner.  Performed at [facility], [address] [place], [place] [address]   CBC     Status: Abnormal   Collection Time: 03/04/20  4:22 AM  Result Value Ref Range   WBC 4.5 4.0 - 10.5 K/uL   RBC 3.27 (L) 3.87 - 5.11 MIL/uL   Hemoglobin 8.4 (L) 12.0 - 15.0 g/dL   HCT 28.6 (L) 36 - 46 %   MCV 87.5 80.0 - 100.0 fL   MCH 25.7 (L) 26.0 - 34.0 pg   MCHC 29.4 (L) 30.0 - 36.0 g/dL   RDW 15.7 (H) 11.5 - 15.5 %   Platelets 185  150 - 400 K/uL   nRBC 0.0 0.0 - 0.2 %    Comment: Performed at [facility], [address] [place], [place] [address]  Basic metabolic panel     Status: Abnormal   Collection Time: 03/04/20  4:22 AM  Result Value Ref Range   Sodium 139 135 - 145 mmol/L   Potassium 4.3 3.5 - 5.1 mmol/L   Chloride 108 98 - 111 mmol/L   CO2 24 22 - 32 mmol/L   Glucose, Bld 81 70 - 99 mg/dL    Comment: Glucose reference range applies only to samples taken after fasting for at least 8 hours.   BUN 12 8 - 23 mg/dL   Creatinine, Ser 0.85 0.44 - 1.00 mg/dL   Calcium 11.9 (H) 8.9 - 10.3 mg/dL   GFR calc non Af Amer >60 >60 mL/min    GFR calc Af Amer >60 >60 mL/min   Anion gap 7 5 - 15    Comment: Performed at [facility], [address] [place], [place] [address]   DG Chest 2 View  Result Date: 03/03/2020 CLINICAL DATA:  Weakness, syncope EXAM: CHEST - 2 VIEW COMPARISON:  02/26/2020 FINDINGS: Right lung is grossly clear. Left lower lobe 2.5 cm suspected lung nodule. Previously noted possible left upper lobe lung nodule is not well seen today. Stable cardiomediastinal silhouette. No pneumothorax. Trace pleural effusions. IMPRESSION: 1. Trace pleural effusions. 2. Left lower lobe 2.5 cm suspected lung nodule. Chest CT is recommended for further evaluation. Electronically Signed   By: Donavan Foil M.D.   On: 03/03/2020 16:40   CT CHEST WO CONTRAST  Result Date: 03/03/2020 CLINICAL DATA:  Lung nodule, hypercalcemia EXAM: CT CHEST WITHOUT CONTRAST TECHNIQUE: Multidetector CT imaging of the chest was performed following the standard protocol without IV contrast. COMPARISON:  None. FINDINGS: Cardiovascular: Cardiac size is within normal limits. No significant coronary artery calcification. No pericardial effusion. The central pulmonary arteries are enlarged in keeping with changes of pulmonary arterial hypertension. The thoracic aorta is unremarkable. Mediastinum/Nodes: There is pathologic right paratracheal adenopathy with a a single lymph node measuring 15 mm x 20 mm at axial image # 54/2. Esophagus unremarkable. Thyroid unremarkable. Lungs/Pleura: There are multiple pulmonary nodules identified within the lungs bilaterally demonstrating a basilar predominance. The nodule noted on prior chest radiograph represents the dominant nodule measuring 24 mm x 24 mm at axial image # 101/3. There are at least 20 such nodule seen scattered throughout the lungs. Small right pleural effusion is present. The appearance and distribution are suspicious for metastatic disease. A primary pulmonary mass with intra pulmonary metastases is  considered less likely. Small right pleural effusion is present. No pneumothorax. No central obstructing mass. Upper Abdomen: Cholelithiasis. There is nodular enlargement of the right adrenal gland, suspicious for metastatic involvement. There is pathologic periaortic and retrocrural adenopathy with the index lymph node measuring 13 mm in short axis diameter at axial image # 128/2 Musculoskeletal: There are 3 possible subcentimeter lytic lesions identified within the visualized portion of the L2 vertebral body involving the anterior aspect the vertebral body as well as the right pedicle. This is incompletely included on this examination and not well assessed. No other lytic or blastic bone lesion is identified. 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 Possible lytic lesion within the L2 vertebral  body. The organ of origin is not clearly identified on this examination. Small right pleural effusion is nonspecific though a malignant pleural effusion must be considered. Completion examination of the abdomen and pelvis with contrast and possible PET CT examination may be helpful for further evaluation. Cholelithiasis. Enlarged central pulmonary arteries in keeping with changes of pulmonary arterial hypertension. Electronically Signed   By: Fidela Salisbury MD   On: 03/03/2020 19:17   CT ABDOMEN PELVIS W CONTRAST  Result Date: 03/04/2020 CLINICAL DATA:  Pulmonary nodules, metastatic disease suspected, primary malignancy search and staging EXAM: CT ABDOMEN AND PELVIS WITH CONTRAST TECHNIQUE: Multidetector CT imaging of the abdomen and pelvis was performed using the standard protocol following bolus administration of intravenous contrast. CONTRAST:  30m OMNIPAQUE IOHEXOL 300 MG/ML  SOLN COMPARISON:   CT chest, 03/04/2019 FINDINGS: Lower chest: Small right, trace left pleural effusions and multiple bilateral pulmonary nodules, better evaluated by recent prior CT of the chest. Hepatobiliary: Somewhat shrunken, coarse and nodular contour of the liver. Gallstones. No gallbladder wall thickening, or biliary dilatation. Pancreas: Unremarkable. No pancreatic ductal dilatation or surrounding inflammatory changes. Spleen: Mild splenomegaly, maximum coronal span 13.9 cm. Adrenals/Urinary Tract: Adrenal glands are unremarkable. There is a large, heterogeneous mass arising from the anterior right kidney measuring approximately 8.8 x 8.5 x 7.5 cm (series 5, image 40). Bladder is unremarkable. Stomach/Bowel: Stomach is within normal limits. Appendix appears normal. No evidence of bowel wall thickening, distention, or inflammatory changes. Vascular/Lymphatic: Aortic atherosclerosis. Numerous enlarged retroperitoneal lymph nodes measuring up to 2.6 x 1.2 cm (series 2, image 32). Reproductive: No mass or other significant abnormality. Other: No abdominal wall hernia or abnormality. Trace ascites throughout the abdomen and pelvis. Musculoskeletal: There are multiple lytic osseous lesions involving the L2 and L3 vertebral bodies with clear cortical and medullary destruction (series 2, image 34). There are multiple additional more subtle osseous lucencies throughout the spine and pelvis, which are suspicious for very subtle osseous metastatic disease. IMPRESSION: 1. There is a large, heterogeneous mass arising from the anterior right kidney measuring approximately 8.8 x 8.5 x 7.5 cm. Numerous enlarged retroperitoneal lymph nodes. 2. Multiple lytic osseous lesions involving the L2 and L3 vertebral bodies with clear cortical and medullary destruction. There are multiple additional more subtle osseous lucencies throughout the spine and pelvis, which are suspicious for very subtle osseous metastatic disease. 3. Small right, trace left  pleural effusions and multiple bilateral pulmonary nodules, better evaluated by recent prior CT of the chest. 4. 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 5. Stigmata of cirrhosis and splenomegaly. 6. Trace ascites. 7. Cholelithiasis. 8. Aortic Atherosclerosis (ICD10-I70.0). Electronically Signed   By: AEddie CandleM.D.   On: 03/04/2020 13:36    Assessment:  The patient is a 74y.o. 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  She presented with hypercalcemia.  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  There was a possible lytic lesion within the L2 vertebral body.  Abdomen and pelvis CT on 03/04/2020 revealed an 8.8 x 8.5 x 7.5 cm heterogeneous mass arising from the anterior right kidney.  There were numerous and large retroperitoneal lymph nodes.  There are multiple lytic osseous lesions involving the L2 and L3 vertebral bodies with clear cortical and medullary destruction.  There are multiple additional subtle osseous lucencies throughout the spine and pelvis were suspicious for subtle  metastatic disease.  There is a small right and trace left pleural effusion and multiple bilateral pulmonary nodules.  There was stigmata of cirrhosis and splenomegaly with trace ascites.  Symptomatically, she currently denies any back pain.  She is slightly confused.  Exam reveals fullness in the right upper quadrant.  Lower extremity strength and sensation is intact.  Plan:   1.   Right kidney mass  Etiology is felt likely secondary to renal cell carcinoma.  Images personally reviewed.  Agree with radiology interpretation.  Discussed with interventional radiology.  Plan for CT-guided biopsy of L1 or L2 on 03/07/2020.  If renal cell carcinoma confirmed, patient in poor prognostic group  based on [facility] criteria (metastatic disease, KPS < 80%, hypercalcemia, and anemia).   Check LDH.  Patient states that she would be interested in treatment if diagnosis confirmed.   Treatment briefly discussed (TKI/VEGF inhibitor + anti-PDL1 antibody or checkpoint inhibitor/anti-CTLA4 antibody +  anti-PDL1 antibody).     Will need to discuss further with patient's husband.  Discuss need for monthly Zometa to manage hypercalcemia and prevent bone related events. 2.  Hypercalcemia  Initial corrected calcium 13.2 and currently 12.8.  Improvement s/p Zometa.  Continue IV fluids. 3.  Normocytic anemia  Etiology likely secondary to renal cell carcinoma.  Patient denies any bleeding, but notes weight loss (? diet).  Check ferritin, iron studies, B12, folate, retic.   Thank you for allowing me to participate in Mariah Thornton 's care.  I will follow her closely with you while hospitalized and after discharge in the outpatient department.   Lequita Asal, MD  03/04/2020, 5:40 PM

## 2020-03-04 NOTE — ED Notes (Signed)
Patient went to CT with a tech

## 2020-03-04 NOTE — Evaluation (Addendum)
Physical Therapy Evaluation Patient Details Name: Mariah Thornton MRN: [id number] DOB: April 06, 1946 Today's Date: 03/04/2020   History of Present Illness  Pt is a 74 yo female that presented to ED from [facility] for back pain, weakness, potential confusion, as well as difficulty walking. Was recently seen for similar complaints, diagnosed with PNA and sent home on antibiotics. Workup here showed hypercalcemia (currently 11.9).    Clinical Impression  Pt alert, oriented x4, unable to recall president but able to provide PLOF that was verified by husband in room. At baseline pt is independent for ADLs (says her husband does supervise for bathing for safety), and usually ambulates while holding on to him. Endorsed "a couple" falls where she loses her balance and falls backwards.  The patient demonstrated bed mobility modI (extended time need). Sit <> stand with handheld assist and with RW. Pt exhibited improved independence and stability with use of RW, education provided for hand placement. She ambulated ~60ft with RW and supervision/CGA. Initially hand held assist, transitioned to RW due to pt unsteadiness and reaching for bilateral UE support. Short, shuffled steps noted, education provided on RW use. Pt returned to room and assisted with breakfast set up, no further questions at this time.  Overall the patient demonstrated deficits (see "PT Problem List") that impede the patient's functional abilities, safety, and mobility and would benefit from skilled PT intervention. Recommendation is HHPT to maximize pt function and decrease risk of falls.  Of note unclear spO2 readings throughout ambulation (varied from 60s-80s) pt on room air and without complaints of SOB, dizziness/lightheadedness. When returned to rest, spO2 readings >90%. RN notified.      Follow Up Recommendations Home health PT    Equipment Recommendations  Rolling walker with 5" wheels    Recommendations for Other Services        Precautions / Restrictions Precautions Precautions: Fall Restrictions Weight Bearing Restrictions: No      Mobility  Bed Mobility Overal bed mobility: Modified Independent                Transfers Overall transfer level: Needs assistance Equipment used: 1 person hand held assist;Rolling walker (2 wheeled) Transfers: Sit to/from Stand Sit to Stand: Min guard         General transfer comment: transferred initially with handheld assist, improved safety and independence noted with RW with education for hand placement  Ambulation/Gait Ambulation/Gait assistance: Min guard;Supervision Gait Distance (Feet): 70 Feet Assistive device: Rolling walker (2 wheeled);1 person hand held assist       General Gait Details: initially hand held assist, transitioned to RW due to pt unsteadiness and reaching for bilateral UE support. Short, shuffled steps noted, education provided on RW use.  Stairs            Wheelchair Mobility    Modified Rankin (Stroke Patients Only)       Balance Overall balance assessment: Needs assistance Sitting-balance support: Feet supported Sitting balance-Leahy Scale: Good       Standing balance-Leahy Scale: Fair Standing balance comment: reliant on at least unilateral support for dynamic balance/ambulation                             Pertinent Vitals/Pain Pain Assessment: No/denies pain    Home Living Family/patient expects to be discharged to:: Private residence Living Arrangements: Spouse/significant other Available Help at Discharge: Family Type of Home: House Home Access: Stairs to enter   [employer] of  Steps: 3 in front, 1 in back, no handrails on back Home Layout: One level Home Equipment: None Additional Comments: a couple of falls, falls backwards majority of falls, says she loses her balance    Prior Function Level of Independence: Independent         Comments: some supervision for bathing,  participates in cooking/cleaning. Often walks while holding onto her husband     Hand Dominance   Dominant Hand: Right    Extremity/Trunk Assessment   Upper Extremity Assessment Upper Extremity Assessment: Overall WFL for tasks assessed    Lower Extremity Assessment Lower Extremity Assessment: Generalized weakness (grossly 3+/5)    Cervical / Trunk Assessment Cervical / Trunk Assessment: Normal  Communication   Communication: HOH  Cognition Arousal/Alertness: Awake/alert Behavior During Therapy: WFL for tasks assessed/performed Overall Cognitive Status: Within Functional Limits for tasks assessed                                 General Comments: answered all orientation questions appropriately, cannot recall president, able to answer PLOF and verified with pt husband      General Comments      Exercises     Assessment/Plan    PT Assessment Patient needs continued PT services  PT Problem List Decreased strength;Decreased activity tolerance;Decreased mobility;Decreased balance;Decreased knowledge of use of DME;Pain       PT Treatment Interventions DME instruction;Balance training;Gait training;Neuromuscular re-education;Stair training;Functional mobility training;Patient/family education;Therapeutic activities;Therapeutic exercise    PT Goals (Current goals can be found in the Care Plan section)  Acute Rehab PT Goals Patient Stated Goal: to go home PT Goal Formulation: With patient Time For Goal Achievement: 03/18/20 Potential to Achieve Goals: Good    Frequency Min 2X/week   Barriers to discharge        Co-evaluation               AM-PAC PT "6 Clicks" Mobility  Outcome Measure Help needed turning from your back to your side while in a flat bed without using bedrails?: None Help needed moving from lying on your back to sitting on the side of a flat bed without using bedrails?: None Help needed moving to and from a bed to a chair  (including a wheelchair)?: A Little Help needed standing up from a chair using your arms (e.g., wheelchair or bedside chair)?: A Little Help needed to walk in hospital room?: A Little Help needed climbing 3-5 steps with a railing? : A Little 6 Click Score: 20    End of Session Equipment Utilized During Treatment: Gait belt Activity Tolerance: Patient tolerated treatment well Patient left: in bed;with family/visitor present;with call bell/phone within reach Nurse Communication: Mobility status PT Visit Diagnosis: Other abnormalities of gait and mobility (R26.89);History of falling (Z91.81);Pain Pain - Right/Left:  (midline) Pain - part of body:  (low back pain)    Time: 0922-0950 PT Time Calculation (min) (ACUTE ONLY): 28 min   Charges:   PT Evaluation $PT Eval Low Complexity: 1 Low PT Treatments $Gait Training: 8-22 mins       Lieutenant Diego PT, DPT 10:19 AM,03/04/20

## 2020-03-04 NOTE — Treatment Plan (Signed)
Pt to unit via wheelchair at this time, pt able to ambulate to bed with +1 assist and then ambulated to bathroom with rolling walker. Pt then had urine and loose BM, yellow and Tytan Sandate in color stool. Pt is AxOx3, disoriented to time. Pt has large mass to posterior neck, red, hard and size of golf ball. Pt then has quarter size red nodule to posterior head, blanchable pink to bottom.  Pt husband at bedside. Pt changed into gown at this time. Weight completed and charter into EMR. Pt and spouse educated to unit and how to call for help. Pt states she just ate in ED and is not hungry. Bed alarm placed on at this time, VSS taken by nurse. Peri care completed by RN. Will continue to monitor.

## 2020-03-05 LAB — COMPREHENSIVE METABOLIC PANEL
ALT: 11 U/L (ref 0–44)
AST: 22 U/L (ref 15–41)
Albumin: 2.4 g/dL — ABNORMAL LOW (ref 3.5–5.0)
Alkaline Phosphatase: 94 U/L (ref 38–126)
Anion gap: 5 (ref 5–15)
BUN: 9 mg/dL (ref 8–23)
CO2: 24 mmol/L (ref 22–32)
Calcium: 9.9 mg/dL (ref 8.9–10.3)
Chloride: 108 mmol/L (ref 98–111)
Creatinine, Ser: 0.79 mg/dL (ref 0.44–1.00)
GFR calc Af Amer: >60 mL/min (ref >60)
GFR calc non Af Amer: >60 mL/min (ref >60)
Glucose, Bld: 81 mg/dL (ref 70–99)
Potassium: 4 mmol/L (ref 3.5–5.1)
Sodium: 137 mmol/L (ref 135–145)
Total Bilirubin: 0.8 mg/dL (ref 0.3–1.2)
Total Protein: 6.3 g/dL — ABNORMAL LOW (ref 6.5–8.1)

## 2020-03-05 LAB — CBC
HCT: 26.1 % — ABNORMAL LOW (ref 36.0–46.0)
Hemoglobin: 8.1 g/dL — ABNORMAL LOW (ref 12.0–15.0)
MCH: 26 pg (ref 26.0–34.0)
MCHC: 31 g/dL (ref 30.0–36.0)
MCV: 83.7 fL (ref 80.0–100.0)
Platelets: 175 10*3/uL (ref 150–400)
RBC: 3.12 MIL/uL — ABNORMAL LOW (ref 3.87–5.11)
RDW: 15.9 % — ABNORMAL HIGH (ref 11.5–15.5)
WBC: 4.8 10*3/uL (ref 4.0–10.5)
nRBC: 0 % (ref 0.0–0.2)

## 2020-03-05 LAB — LACTATE DEHYDROGENASE: LDH: 245 U/L — ABNORMAL HIGH (ref 98–192)

## 2020-03-05 LAB — PARATHYROID HORMONE, INTACT (NO CA): PTH: 12 pg/mL — ABNORMAL LOW (ref 15–65)

## 2020-03-05 NOTE — Progress Notes (Signed)
PROGRESS NOTE    Mariah Thornton  ZDG:[id number] DOB: [birth year] DOA: 03/03/2020 PCP: Patient, No Pcp Per   Assessment & Plan:   Active Problems:   Hypercalcemia  Right kidney mass: w/ mets to bone & lung likely. 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 Will go for biopsy of L1 or L2 on 03/07/20 as per onco.   Left lung nodule: 2.5 cm. Likely mets from renal primary.   Hypercalcemia: will continue on IVFs. S/p IV zolendronic acid. WNL today. Likely secondary to underlying renal cell carcinoma   Likely cirrhosis: w/ trace ascites & splenomegaly as per CT scan.   Normocytic anemia: etiology unclear. Will transfuse if Hb <7. Will continue to monitor   Back pain: etiology unclear, possibly secondary to underlying malignancy. Tramadol, morphine prn for pain   Difficulty ambulating: PT/OT recs home health      DVT prophylaxis: lovenox Code Status: full  Family Communication: discussed pt's care w/ pt's husband & pt's friend and answered their questions  Disposition Plan: likely home health   Status is: Inpatient  Remains inpatient appropriate because:Ongoing diagnostic testing needed not appropriate for outpatient work up; will biopsy on 03/07/20   Dispo: The patient is from: Home              Anticipated d/c is to: Home w/ home health               Anticipated d/c date is: 3 days              Patient currently is not medically stable to d/c.    Consultants:  Onco   Procedures:    Antimicrobials:    Subjective: Pt c/o back pain   Objective: Vitals:   03/04/20 0755 03/04/20 1356 03/04/20 1810 03/05/20 0417  BP: (!) 145/64 (!) 165/65 134/87 (!) 142/85  Pulse: 78 89 95 89  Resp: (!) 21 20  20   Temp:  97.9 F (36.6 C) 97.7 F (36.5 C) 97.7 F (36.5 C)  TempSrc:  Oral  Oral  SpO2: 100% 96% 97%   Weight:  63.2 kg    Height:        Intake/Output Summary (Last 24 hours) at 03/05/2020 0732 Last data filed at 03/05/2020 0549 Gross per 24 hour  Intake 1199.54  ml  Output --  Net 1199.54 ml   Filed Weights   03/03/20 1001 03/04/20 1356  Weight: 63 kg 63.2 kg    Examination:  General exam: Appears calm and comfortable  Respiratory system: decreased breath sounds b/l. No rales, wheezes Cardiovascular system: S1 & S2 +. No rubs, gallops or clicks.  Gastrointestinal system: Abdomen is nondistended, soft and nontender. Hypoactive bowel sounds heard. Central nervous system: Alert and awake. Moves all 4 extremities  Psychiatry: Judgement and insight appear normal. Flat mood and affect    Data Reviewed: I have personally reviewed following labs and imaging studies  CBC: Recent Labs  Lab 03/03/20 1017 03/04/20 0422 03/05/20 0613  WBC 5.9 4.5 4.8  HGB 8.8* 8.4* 8.1*  HCT 28.5* 28.6* 26.1*  MCV 84.6 87.5 83.7  PLT 198 185 951   Basic Metabolic Panel: Recent Labs  Lab 03/03/20 1017 03/04/20 0422 03/05/20 0613  NA 137 139 137  K 4.7 4.3 4.0  CL 103 108 108  CO2 23 24 24   GLUCOSE 94 81 81  BUN 12 12 9   CREATININE 0.96 0.85 0.79  CALCIUM 12.3* 11.9* 9.9   GFR: Estimated Creatinine Clearance: 53 mL/min (by C-G  formula based on SCr of 0.79 mg/dL). Liver Function Tests: Recent Labs  Lab 03/03/20 1017 03/05/20 0613  AST 26 22  ALT 12 11  ALKPHOS 109 94  BILITOT 0.7 0.8  PROT 7.6 6.3*  ALBUMIN 2.9* 2.4*   No results for input(s): LIPASE, AMYLASE in the last 168 hours. No results for input(s): AMMONIA in the last 168 hours. Coagulation Profile: No results for input(s): INR, PROTIME in the last 168 hours. Cardiac Enzymes: No results for input(s): CKTOTAL, CKMB, CKMBINDEX, TROPONINI in the last 168 hours. BNP (last 3 results) No results for input(s): PROBNP in the last 8760 hours. HbA1C: No results for input(s): HGBA1C in the last 72 hours. CBG: No results for input(s): GLUCAP in the last 168 hours. Lipid Profile: No results for input(s): CHOL, HDL, LDLCALC, TRIG, CHOLHDL, LDLDIRECT in the last 72 hours. Thyroid  Function Tests: Recent Labs    03/03/20 1744  TSH 3.891  FREET4 1.03   Anemia Panel: No results for input(s): VITAMINB12, FOLATE, FERRITIN, TIBC, IRON, RETICCTPCT in the last 72 hours. Sepsis Labs: No results for input(s): PROCALCITON, LATICACIDVEN in the last 168 hours.  Recent Results (from the past 240 hour(s))  SARS Coronavirus 2 by RT PCR (hospital order, performed in [facility] hospital lab) Nasopharyngeal Nasopharyngeal Swab     Status: None   Collection Time: 03/03/20  5:44 PM   Specimen: Nasopharyngeal Swab  Result Value Ref Range Status   SARS Coronavirus 2 NEGATIVE NEGATIVE Final    Comment: (NOTE) SARS-CoV-2 target nucleic acids are NOT DETECTED.  The SARS-CoV-2 RNA is generally detectable in upper and lower respiratory specimens during the acute phase of infection. The lowest concentration of SARS-CoV-2 viral copies this assay can detect is 250 copies / mL. A negative result does not preclude SARS-CoV-2 infection and should not be used as the sole basis for treatment or other patient management decisions.  A negative result may occur with improper specimen collection / handling, submission of specimen other than nasopharyngeal swab, presence of viral mutation(s) within the areas targeted by this assay, and inadequate number of viral copies (<250 copies / mL). A negative result must be combined with clinical observations, patient history, and epidemiological information.  Fact Sheet for Patients:   StrictlyIdeas.no  Fact Sheet for Healthcare Providers: BankingDealers.co.za  This test is not yet approved or  cleared by the [place] FDA and has been authorized for detection and/or diagnosis of SARS-CoV-2 by FDA under an Emergency Use Authorization (EUA).  This EUA will remain in effect (meaning this test can be used) for the duration of the COVID-19 declaration under Section 564(b)(1) of the Act, 21  U.S.C. section 360bbb-3(b)(1), unless the authorization is terminated or revoked sooner.  Performed at [facility], [address], [place], [place] [address]          Radiology Studies: DG Chest 2 View  Result Date: 03/03/2020 CLINICAL DATA:  Weakness, syncope EXAM: CHEST - 2 VIEW COMPARISON:  02/26/2020 FINDINGS: Right lung is grossly clear. Left lower lobe 2.5 cm suspected lung nodule. Previously noted possible left upper lobe lung nodule is not well seen today. Stable cardiomediastinal silhouette. No pneumothorax. Trace pleural effusions. IMPRESSION: 1. Trace pleural effusions. 2. Left lower lobe 2.5 cm suspected lung nodule. Chest CT is recommended for further evaluation. Electronically Signed   By: Donavan Foil M.D.   On: 03/03/2020 16:40   CT CHEST WO CONTRAST  Result Date: 03/03/2020 CLINICAL DATA:  Lung nodule, hypercalcemia EXAM: CT CHEST  WITHOUT CONTRAST TECHNIQUE: Multidetector CT imaging of the chest was performed following the standard protocol without IV contrast. COMPARISON:  None. FINDINGS: Cardiovascular: Cardiac size is within normal limits. No significant coronary artery calcification. No pericardial effusion. The central pulmonary arteries are enlarged in keeping with changes of pulmonary arterial hypertension. The thoracic aorta is unremarkable. Mediastinum/Nodes: There is pathologic right paratracheal adenopathy with a a single lymph node measuring 15 mm x 20 mm at axial image # 54/2. Esophagus unremarkable. Thyroid unremarkable. Lungs/Pleura: There are multiple pulmonary nodules identified within the lungs bilaterally demonstrating a basilar predominance. The nodule noted on prior chest radiograph represents the dominant nodule measuring 24 mm x 24 mm at axial image # 101/3. There are at least 20 such nodule seen scattered throughout the lungs. Small right pleural effusion is present. The appearance and distribution are suspicious for metastatic disease. A  primary pulmonary mass with intra pulmonary metastases is considered less likely. Small right pleural effusion is present. No pneumothorax. No central obstructing mass. Upper Abdomen: Cholelithiasis. There is nodular enlargement of the right adrenal gland, suspicious for metastatic involvement. There is pathologic periaortic and retrocrural adenopathy with the index lymph node measuring 13 mm in short axis diameter at axial image # 128/2 Musculoskeletal: There are 3 possible subcentimeter lytic lesions identified within the visualized portion of the L2 vertebral body involving the anterior aspect the vertebral body as well as the right pedicle. This is incompletely included on this examination and not well assessed. No other lytic or blastic bone lesion is identified. 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 Possible lytic lesion within the L2 vertebral body. The organ of origin is not clearly identified on this examination. Small right pleural effusion is nonspecific though a malignant pleural effusion must be considered. Completion examination of the abdomen and pelvis with contrast and possible PET CT examination may be helpful for further evaluation. Cholelithiasis. Enlarged central pulmonary arteries in keeping with changes of pulmonary arterial hypertension. Electronically Signed   By: Fidela Salisbury MD   On: 03/03/2020 19:17   CT ABDOMEN PELVIS W CONTRAST  Result Date: 03/04/2020 CLINICAL DATA:  Pulmonary nodules, metastatic disease suspected, primary malignancy search and staging EXAM: CT ABDOMEN AND PELVIS WITH CONTRAST TECHNIQUE: Multidetector CT imaging of the abdomen and pelvis was performed using the standard protocol following bolus administration of intravenous contrast.  CONTRAST:  26mL OMNIPAQUE IOHEXOL 300 MG/ML  SOLN COMPARISON:  CT chest, 03/04/2019 FINDINGS: Lower chest: Small right, trace left pleural effusions and multiple bilateral pulmonary nodules, better evaluated by recent prior CT of the chest. Hepatobiliary: Somewhat shrunken, coarse and nodular contour of the liver. Gallstones. No gallbladder wall thickening, or biliary dilatation. Pancreas: Unremarkable. No pancreatic ductal dilatation or surrounding inflammatory changes. Spleen: Mild splenomegaly, maximum coronal span 13.9 cm. Adrenals/Urinary Tract: Adrenal glands are unremarkable. There is a large, heterogeneous mass arising from the anterior right kidney measuring approximately 8.8 x 8.5 x 7.5 cm (series 5, image 40). Bladder is unremarkable. Stomach/Bowel: Stomach is within normal limits. Appendix appears normal. No evidence of bowel wall thickening, distention, or inflammatory changes. Vascular/Lymphatic: Aortic atherosclerosis. Numerous enlarged retroperitoneal lymph nodes measuring up to 2.6 x 1.2 cm (series 2, image 32). Reproductive: No mass or other significant abnormality. Other: No abdominal wall hernia or abnormality. Trace ascites throughout the abdomen and pelvis. Musculoskeletal: There  are multiple lytic osseous lesions involving the L2 and L3 vertebral bodies with clear cortical and medullary destruction (series 2, image 34). There are multiple additional more subtle osseous lucencies throughout the spine and pelvis, which are suspicious for very subtle osseous metastatic disease. IMPRESSION: 1. There is a large, heterogeneous mass arising from the anterior right kidney measuring approximately 8.8 x 8.5 x 7.5 cm. Numerous enlarged retroperitoneal lymph nodes. 2. Multiple lytic osseous lesions involving the L2 and L3 vertebral bodies with clear cortical and medullary destruction. There are multiple additional more subtle osseous lucencies throughout the spine and pelvis, which are suspicious for very  subtle osseous metastatic disease. 3. Small right, trace left pleural effusions and multiple bilateral pulmonary nodules, better evaluated by recent prior CT of the chest. 4. 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 5. Stigmata of cirrhosis and splenomegaly. 6. Trace ascites. 7. Cholelithiasis. 8. Aortic Atherosclerosis (ICD10-I70.0). Electronically Signed   By: Eddie Candle M.D.   On: 03/04/2020 13:36        Scheduled Meds: . docusate sodium  200 mg Oral BID  . enoxaparin (LOVENOX) injection  40 mg Subcutaneous Q24H  . sodium chloride flush  3 mL Intravenous Q12H   Continuous Infusions: . sodium chloride 75 mL/hr at 03/05/20 0549     LOS: 2 days    Time spent: 31 mins     Wyvonnia Dusky, MD [facility] Pager [phone]  If 7PM-7AM, please contact night-coverage [email or web address] 03/05/2020, 7:32 AM

## 2020-03-06 LAB — COMPREHENSIVE METABOLIC PANEL
ALT: 11 U/L (ref 0–44)
AST: 27 U/L (ref 15–41)
Albumin: 2.4 g/dL — ABNORMAL LOW (ref 3.5–5.0)
Alkaline Phosphatase: 96 U/L (ref 38–126)
Anion gap: 8 (ref 5–15)
BUN: 9 mg/dL (ref 8–23)
CO2: 22 mmol/L (ref 22–32)
Calcium: 9.2 mg/dL (ref 8.9–10.3)
Chloride: 109 mmol/L (ref 98–111)
Creatinine, Ser: 0.84 mg/dL (ref 0.44–1.00)
GFR calc Af Amer: >60 mL/min (ref >60)
GFR calc non Af Amer: >60 mL/min (ref >60)
Glucose, Bld: 78 mg/dL (ref 70–99)
Potassium: 4 mmol/L (ref 3.5–5.1)
Sodium: 139 mmol/L (ref 135–145)
Total Bilirubin: 0.5 mg/dL (ref 0.3–1.2)
Total Protein: 6.4 g/dL — ABNORMAL LOW (ref 6.5–8.1)

## 2020-03-06 LAB — FERRITIN: Ferritin: 660 ng/mL — ABNORMAL HIGH (ref 11–307)

## 2020-03-06 LAB — IRON AND TIBC
Iron: 16 ug/dL — ABNORMAL LOW (ref 28–170)
Saturation Ratios: 11 % (ref 10.4–31.8)
TIBC: 150 ug/dL — ABNORMAL LOW (ref 250–450)
UIBC: 134 ug/dL

## 2020-03-06 LAB — VITAMIN B12: Vitamin B-12: 2351 pg/mL — ABNORMAL HIGH (ref 180–914)

## 2020-03-06 LAB — RETICULOCYTES
Immature Retic Fract: 28.8 % — ABNORMAL HIGH (ref 2.3–15.9)
RBC.: 2.94 MIL/uL — ABNORMAL LOW (ref 3.87–5.11)
Retic Count, Absolute: 58.8 10*3/uL (ref 19.0–186.0)
Retic Ct Pct: 2 % (ref 0.4–3.1)

## 2020-03-06 LAB — CBC
HCT: 25.3 % — ABNORMAL LOW (ref 36.0–46.0)
Hemoglobin: 7.7 g/dL — ABNORMAL LOW (ref 12.0–15.0)
MCH: 25.7 pg — ABNORMAL LOW (ref 26.0–34.0)
MCHC: 30.4 g/dL (ref 30.0–36.0)
MCV: 84.3 fL (ref 80.0–100.0)
Platelets: 167 10*3/uL (ref 150–400)
RBC: 3 MIL/uL — ABNORMAL LOW (ref 3.87–5.11)
RDW: 16 % — ABNORMAL HIGH (ref 11.5–15.5)
WBC: 3.5 10*3/uL — ABNORMAL LOW (ref 4.0–10.5)
nRBC: 0 % (ref 0.0–0.2)

## 2020-03-06 LAB — FOLATE: Folate: 4.8 ng/mL — ABNORMAL LOW (ref >5.9)

## 2020-03-06 NOTE — Progress Notes (Signed)
PROGRESS NOTE    Mariah Thornton  UDJ:[id number] DOB: 1946/02/11 DOA: 03/03/2020 PCP: Patient, No Pcp Per   Assessment & Plan:   Active Problems:   Hypercalcemia  Right kidney mass: w/ mets to bone & lung. 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 Will go for biopsy of L1 or L2 on 03/07/20 as per onco.   Left lung nodule: 2.5 cm. Likely mets from renal primary.   Hypercalcemia: will continue on IVFs. S/p IV zolendronic acid. Ca level pending. Likely secondary to underlying renal cell carcinoma   Likely cirrhosis: w/ trace ascites & splenomegaly as per CT scan.   Normocytic anemia: etiology unclear. H&H trending down slightly. Will transfuse if Hb <7. Will continue to monitor   Back pain: etiology unclear, possibly secondary to underlying malignancy. Tramadol, morphine prn for pain   Difficulty ambulating: PT/OT recs home health      DVT prophylaxis: lovenox Code Status: full  Family Communication: discussed pt's care w/ pt's husband & answered his questions  Disposition Plan: likely home health   Status is: Inpatient  Remains inpatient appropriate because:Ongoing diagnostic testing needed not appropriate for outpatient work up; will biopsy on 03/07/20   Dispo: The patient is from: Home              Anticipated d/c is to: Home w/ home health               Anticipated d/c date is: 3 days              Patient currently is not medically stable to d/c.    Consultants:  Onco   Procedures:    Antimicrobials:    Subjective: Pt c/o fatigue   Objective: Vitals:   03/05/20 0417 03/05/20 1159 03/05/20 1918 03/06/20 0403  BP: (!) 142/85 (!) 134/59 (!) 136/53 (!) 131/54  Pulse: 89 79 84 77  Resp: 20 16 18 17   Temp: 97.7 F (36.5 C) 98.8 F (37.1 C) 98 F (36.7 C) 98.1 F (36.7 C)  TempSrc: Oral  Oral   SpO2:  96% 97% 97%  Weight:      Height:        Intake/Output Summary (Last 24 hours) at 03/06/2020 0733 Last data filed at 03/06/2020 5885 Gross per 24 hour    Intake 1609.07 ml  Output 200 ml  Net 1409.07 ml   Filed Weights   03/03/20 1001 03/04/20 1356  Weight: 63 kg 63.2 kg    Examination:  General exam: Appears calm and comfortable  Respiratory system: decreased breath sounds b/l. No rales, wheezes Cardiovascular system: S1 & S2 +. No rubs, gallops or clicks.  Gastrointestinal system: Abdomen is nondistended, soft and nontender. Hypoactive bowel sounds heard. Central nervous system: Alert and awake. Moves all 4 extremities  Psychiatry: Judgement and insight appear normal. Flat mood and affect    Data Reviewed: I have personally reviewed following labs and imaging studies  CBC: Recent Labs  Lab 03/03/20 1017 03/04/20 0422 03/05/20 0613 03/06/20 0433  WBC 5.9 4.5 4.8 3.5*  HGB 8.8* 8.4* 8.1* 7.7*  HCT 28.5* 28.6* 26.1* 25.3*  MCV 84.6 87.5 83.7 84.3  PLT 198 185 175 027   Basic Metabolic Panel: Recent Labs  Lab 03/03/20 1017 03/04/20 0422 03/05/20 0613  NA 137 139 137  K 4.7 4.3 4.0  CL 103 108 108  CO2 23 24 24   GLUCOSE 94 81 81  BUN 12 12 9   CREATININE 0.96 0.85 0.79  CALCIUM 12.3* 11.9*  9.9   GFR: Estimated Creatinine Clearance: 53 mL/min (by C-G formula based on SCr of 0.79 mg/dL). Liver Function Tests: Recent Labs  Lab 03/03/20 1017 03/05/20 0613  AST 26 22  ALT 12 11  ALKPHOS 109 94  BILITOT 0.7 0.8  PROT 7.6 6.3*  ALBUMIN 2.9* 2.4*   No results for input(s): LIPASE, AMYLASE in the last 168 hours. No results for input(s): AMMONIA in the last 168 hours. Coagulation Profile: No results for input(s): INR, PROTIME in the last 168 hours. Cardiac Enzymes: No results for input(s): CKTOTAL, CKMB, CKMBINDEX, TROPONINI in the last 168 hours. BNP (last 3 results) No results for input(s): PROBNP in the last 8760 hours. HbA1C: No results for input(s): HGBA1C in the last 72 hours. CBG: No results for input(s): GLUCAP in the last 168 hours. Lipid Profile: No results for input(s): CHOL, HDL, LDLCALC,  TRIG, CHOLHDL, LDLDIRECT in the last 72 hours. Thyroid Function Tests: Recent Labs    03/03/20 1744  TSH 3.891  FREET4 1.03   Anemia Panel: Recent Labs    03/06/20 0433  RETICCTPCT 2.0   Sepsis Labs: No results for input(s): PROCALCITON, LATICACIDVEN in the last 168 hours.  Recent Results (from the past 240 hour(s))  SARS Coronavirus 2 by RT PCR (hospital order, performed in [facility] hospital lab) Nasopharyngeal Nasopharyngeal Swab     Status: None   Collection Time: 03/03/20  5:44 PM   Specimen: Nasopharyngeal Swab  Result Value Ref Range Status   SARS Coronavirus 2 NEGATIVE NEGATIVE Final    Comment: (NOTE) SARS-CoV-2 target nucleic acids are NOT DETECTED.  The SARS-CoV-2 RNA is generally detectable in upper and lower respiratory specimens during the acute phase of infection. The lowest concentration of SARS-CoV-2 viral copies this assay can detect is 250 copies / mL. A negative result does not preclude SARS-CoV-2 infection and should not be used as the sole basis for treatment or other patient management decisions.  A negative result may occur with improper specimen collection / handling, submission of specimen other than nasopharyngeal swab, presence of viral mutation(s) within the areas targeted by this assay, and inadequate number of viral copies (<250 copies / mL). A negative result must be combined with clinical observations, patient history, and epidemiological information.  Fact Sheet for Patients:   StrictlyIdeas.no  Fact Sheet for Healthcare Providers: BankingDealers.co.za  This test is not yet approved or  cleared by the [place] FDA and has been authorized for detection and/or diagnosis of SARS-CoV-2 by FDA under an Emergency Use Authorization (EUA).  This EUA will remain in effect (meaning this test can be used) for the duration of the COVID-19 declaration under Section 564(b)(1) of the Act, 21  U.S.C. section 360bbb-3(b)(1), unless the authorization is terminated or revoked sooner.  Performed at [facility], [address] [place], [place] [address]          Radiology Studies: CT ABDOMEN PELVIS W CONTRAST  Result Date: 03/04/2020 CLINICAL DATA:  Pulmonary nodules, metastatic disease suspected, primary malignancy search and staging EXAM: CT ABDOMEN AND PELVIS WITH CONTRAST TECHNIQUE: Multidetector CT imaging of the abdomen and pelvis was performed using the standard protocol following bolus administration of intravenous contrast. CONTRAST:  25mL OMNIPAQUE IOHEXOL 300 MG/ML  SOLN COMPARISON:  CT chest, 03/04/2019 FINDINGS: Lower chest: Small right, trace left pleural effusions and multiple bilateral pulmonary nodules, better evaluated by recent prior CT of the chest. Hepatobiliary: Somewhat shrunken, coarse and nodular contour of the liver. Gallstones. No gallbladder wall thickening,  or biliary dilatation. Pancreas: Unremarkable. No pancreatic ductal dilatation or surrounding inflammatory changes. Spleen: Mild splenomegaly, maximum coronal span 13.9 cm. Adrenals/Urinary Tract: Adrenal glands are unremarkable. There is a large, heterogeneous mass arising from the anterior right kidney measuring approximately 8.8 x 8.5 x 7.5 cm (series 5, image 40). Bladder is unremarkable. Stomach/Bowel: Stomach is within normal limits. Appendix appears normal. No evidence of bowel wall thickening, distention, or inflammatory changes. Vascular/Lymphatic: Aortic atherosclerosis. Numerous enlarged retroperitoneal lymph nodes measuring up to 2.6 x 1.2 cm (series 2, image 32). Reproductive: No mass or other significant abnormality. Other: No abdominal wall hernia or abnormality. Trace ascites throughout the abdomen and pelvis. Musculoskeletal: There are multiple lytic osseous lesions involving the L2 and L3 vertebral bodies with clear cortical and medullary destruction (series 2, image 34). There are  multiple additional more subtle osseous lucencies throughout the spine and pelvis, which are suspicious for very subtle osseous metastatic disease. IMPRESSION: 1. There is a large, heterogeneous mass arising from the anterior right kidney measuring approximately 8.8 x 8.5 x 7.5 cm. Numerous enlarged retroperitoneal lymph nodes. 2. Multiple lytic osseous lesions involving the L2 and L3 vertebral bodies with clear cortical and medullary destruction. There are multiple additional more subtle osseous lucencies throughout the spine and pelvis, which are suspicious for very subtle osseous metastatic disease. 3. Small right, trace left pleural effusions and multiple bilateral pulmonary nodules, better evaluated by recent prior CT of the chest. 4. 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 5. Stigmata of cirrhosis and splenomegaly. 6. Trace ascites. 7. Cholelithiasis. 8. Aortic Atherosclerosis (ICD10-I70.0). Electronically Signed   By: Eddie Candle M.D.   On: 03/04/2020 13:36        Scheduled Meds: . docusate sodium  200 mg Oral BID  . enoxaparin (LOVENOX) injection  40 mg Subcutaneous Q24H  . sodium chloride flush  3 mL Intravenous Q12H   Continuous Infusions: . sodium chloride 75 mL/hr at 03/06/20 0620     LOS: 3 days    Time spent: 33 mins     Wyvonnia Dusky, MD [facility] Pager [phone]  If 7PM-7AM, please contact night-coverage [email or web address] 03/06/2020, 7:33 AM

## 2020-03-06 NOTE — Progress Notes (Signed)
[facility] Hematology/Oncology Progress Note  Date of admission: 03/03/2020  Hospital day:  03/06/2020  Chief Complaint: Mariah Thornton is a 74 y.o. female with no significant past medical history who was admitted from the emergency room with hypercalcemia and a lung nodule.  Subjective: The patient is feeling better today.  Thinking is clearer.  She denies any back pain.  Social History: The patient is accompanied by the hospitalist today.  Allergies: No Known Allergies  Scheduled Medications: . docusate sodium  200 mg Oral BID  . enoxaparin (LOVENOX) injection  40 mg Subcutaneous Q24H  . sodium chloride flush  3 mL Intravenous Q12H    Review of Systems: GENERAL:  Feels "fine"..  No fevers, sweats.  Unspecified weight loss. PERFORMANCE STATUS (ECOG):  2 HEENT:  No visual changes, runny nose, sore throat, mouth sores or tenderness. Lungs: No shortness of breath or cough.  No hemoptysis. Cardiac:  No chest pain, palpitations, orthopnea, or PND. GI:  No nausea, vomiting, diarrhea, constipation, melena or hematochezia. GU:  No urgency, frequency, dysuria, or hematuria. Musculoskeletal:  Patient currently denies back pain.  No joint pain.  No muscle tenderness. Extremities:  Legs stiff.  No pain or swelling. Skin:  No rashes or skin changes. Neuro:  General weakness.  No headache, numbness or weakness, balance or coordination issues. Endocrine:  No diabetes, thyroid issues, hot flashes or night sweats. Psych:  No mood changes, depression or anxiety. Pain:  No focal pain. Review of systems:  All other systems reviewed and found to be negative.  Physical Exam: Blood pressure (!) 127/51, pulse 75, temperature 97.8 F (36.6 C), resp. rate 16, height 5' 2"  (1.575 m), weight 139 lb 5.3 oz (63.2 kg), SpO2 97 %.  GENERAL: Elderly woman sitting up in bed in the medical unit in no acute distress. MENTAL STATUS:  Alert and oriented to person, place and time. HEAD:  Short  hair.  Normocephalic, atraumatic, face symmetric, no Cushingoid features. EYES:  Blue eyes.  Pupils equal round and reactive to light and accomodation.  No conjunctivitis or scleral icterus. ENT:  Oropharynx clear without lesion.  Tongue normal. Mucous membranes moist.  RESPIRATORY:  Clear to auscultation without rales, wheezes or rhonchi. CARDIOVASCULAR:  Regular rate and rhythm without murmur, rub or gallop. ABDOMEN: Right upper quadrant full.  Soft, non-tender, with active bowel sounds, and no splenomegaly.  No masses. SKIN:  No rashes, ulcers or lesions. EXTREMITIES: ICDs in place.  No edema, no skin discoloration or tenderness.  No palpable cords. NEUROLOGICAL: Unremarkable.  She is aware of where she is today.  She knows the year and current president.  She states that therefore nickels in a quarter. PSYCH:  Appropriate.  Affect is bright and interactive.   Results for orders placed or performed during the hospital encounter of 03/03/20 (from the past 48 hour(s))  CBC     Status: Abnormal   Collection Time: 03/05/20  6:13 AM  Result Value Ref Range   WBC 4.8 4.0 - 10.5 K/uL   RBC 3.12 (L) 3.87 - 5.11 MIL/uL   Hemoglobin 8.1 (L) 12.0 - 15.0 g/dL   HCT 26.1 (L) 36 - 46 %   MCV 83.7 80.0 - 100.0 fL   MCH 26.0 26.0 - 34.0 pg   MCHC 31.0 30.0 - 36.0 g/dL   RDW 15.9 (H) 11.5 - 15.5 %   Platelets 175 150 - 400 K/uL   nRBC 0.0 0.0 - 0.2 %    Comment: Performed  at [facility], [address] [place], [place] [address]  Comprehensive metabolic panel     Status: Abnormal   Collection Time: 03/05/20  6:13 AM  Result Value Ref Range   Sodium 137 135 - 145 mmol/L   Potassium 4.0 3.5 - 5.1 mmol/L   Chloride 108 98 - 111 mmol/L   CO2 24 22 - 32 mmol/L   Glucose, Bld 81 70 - 99 mg/dL    Comment: Glucose reference range applies only to samples taken after fasting for at least 8 hours.   BUN 9 8 - 23 mg/dL   Creatinine, Ser 0.79 0.44 - 1.00 mg/dL   Calcium 9.9 8.9 - 10.3 mg/dL    Total Protein 6.3 (L) 6.5 - 8.1 g/dL   Albumin 2.4 (L) 3.5 - 5.0 g/dL   AST 22 15 - 41 U/L   ALT 11 0 - 44 U/L   Alkaline Phosphatase 94 38 - 126 U/L   Total Bilirubin 0.8 0.3 - 1.2 mg/dL   GFR calc non Af Amer >60 >60 mL/min   GFR calc Af Amer >60 >60 mL/min   Anion gap 5 5 - 15    Comment: Performed at [facility], [address] [place], [place] [address]  Lactate dehydrogenase     Status: Abnormal   Collection Time: 03/05/20  6:13 AM  Result Value Ref Range   LDH 245 (H) 98 - 192 U/L    Comment: Performed at [facility], [address] [place], [place] [address]  CBC     Status: Abnormal   Collection Time: 03/06/20  4:33 AM  Result Value Ref Range   WBC 3.5 (L) 4.0 - 10.5 K/uL   RBC 3.00 (L) 3.87 - 5.11 MIL/uL   Hemoglobin 7.7 (L) 12.0 - 15.0 g/dL   HCT 25.3 (L) 36 - 46 %   MCV 84.3 80.0 - 100.0 fL   MCH 25.7 (L) 26.0 - 34.0 pg   MCHC 30.4 30.0 - 36.0 g/dL   RDW 16.0 (H) 11.5 - 15.5 %   Platelets 167 150 - 400 K/uL   nRBC 0.0 0.0 - 0.2 %    Comment: Performed at [facility], [address] [place], [place] [address]  Vitamin B12     Status: Abnormal   Collection Time: 03/06/20  4:33 AM  Result Value Ref Range   Vitamin B-12 2,351 (H) 180 - 914 pg/mL    Comment: (NOTE) This assay is not validated for testing neonatal or myeloproliferative syndrome specimens for Vitamin B12 levels. Performed at [facility], [place] [address] [place], [place] [address]   Reticulocytes     Status: Abnormal   Collection Time: 03/06/20  4:33 AM  Result Value Ref Range   Retic Ct Pct 2.0 0.4 - 3.1 %   RBC. 2.94 (L) 3.87 - 5.11 MIL/uL   Retic Count, Absolute 58.8 19.0 - 186.0 K/uL   Immature Retic Fract 28.8 (H) 2.3 - 15.9 %    Comment: Performed at [facility], [address] [place], [place] [address]   CT ABDOMEN PELVIS W CONTRAST  Result Date: 03/04/2020 CLINICAL DATA:  Pulmonary nodules, metastatic disease suspected, primary  malignancy search and staging EXAM: CT ABDOMEN AND PELVIS WITH CONTRAST TECHNIQUE: Multidetector CT imaging of the abdomen and pelvis was performed using the standard protocol following bolus administration of intravenous contrast. CONTRAST:  64m OMNIPAQUE IOHEXOL 300 MG/ML  SOLN COMPARISON:  CT chest, 03/04/2019 FINDINGS: Lower chest:  Small right, trace left pleural effusions and multiple bilateral pulmonary nodules, better evaluated by recent prior CT of the chest. Hepatobiliary: Somewhat shrunken, coarse and nodular contour of the liver. Gallstones. No gallbladder wall thickening, or biliary dilatation. Pancreas: Unremarkable. No pancreatic ductal dilatation or surrounding inflammatory changes. Spleen: Mild splenomegaly, maximum coronal span 13.9 cm. Adrenals/Urinary Tract: Adrenal glands are unremarkable. There is a large, heterogeneous mass arising from the anterior right kidney measuring approximately 8.8 x 8.5 x 7.5 cm (series 5, image 40). Bladder is unremarkable. Stomach/Bowel: Stomach is within normal limits. Appendix appears normal. No evidence of bowel wall thickening, distention, or inflammatory changes. Vascular/Lymphatic: Aortic atherosclerosis. Numerous enlarged retroperitoneal lymph nodes measuring up to 2.6 x 1.2 cm (series 2, image 32). Reproductive: No mass or other significant abnormality. Other: No abdominal wall hernia or abnormality. Trace ascites throughout the abdomen and pelvis. Musculoskeletal: There are multiple lytic osseous lesions involving the L2 and L3 vertebral bodies with clear cortical and medullary destruction (series 2, image 34). There are multiple additional more subtle osseous lucencies throughout the spine and pelvis, which are suspicious for very subtle osseous metastatic disease. IMPRESSION: 1. There is a large, heterogeneous mass arising from the anterior right kidney measuring approximately 8.8 x 8.5 x 7.5 cm. Numerous enlarged retroperitoneal lymph nodes. 2. Multiple  lytic osseous lesions involving the L2 and L3 vertebral bodies with clear cortical and medullary destruction. There are multiple additional more subtle osseous lucencies throughout the spine and pelvis, which are suspicious for very subtle osseous metastatic disease. 3. Small right, trace left pleural effusions and multiple bilateral pulmonary nodules, better evaluated by recent prior CT of the chest. 4. 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 5. Stigmata of cirrhosis and splenomegaly. 6. Trace ascites. 7. Cholelithiasis. 8. Aortic Atherosclerosis (ICD10-I70.0). Electronically Signed   By: Eddie Candle M.D.   On: 03/04/2020 13:36    Assessment:  Mariah Thornton is a 74 y.o. 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  She presented with hypercalcemia.  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  There was a possible lytic lesion within the L2 vertebral body.  Abdomen and pelvis CT on 03/04/2020 revealed an 8.8 x 8.5 x 7.5 cm heterogeneous mass arising from the anterior right kidney.  There were numerous and large retroperitoneal lymph nodes.  There are multiple lytic osseous lesions involving the L2 and L3 vertebral bodies with clear cortical and medullary destruction.  There are multiple additional subtle osseous lucencies throughout the spine and pelvis were suspicious for subtle metastatic disease.  There is a small right and trace left pleural effusion and multiple bilateral pulmonary nodules.  There was stigmata of cirrhosis and splenomegaly with trace ascites.  Symptomatically, she is much more alert today correction of her calcium.  Plan:   1.   Right kidney mass             Etiology is felt likely secondary to renal cell carcinoma.             Images personally  reviewed.  Agree with radiology interpretation.  Discussed with interventional radiology.             Plan for CT-guided biopsy of L1 or L2 on 03/07/2020.             Patient in poor prognostic group based on [facility] criteria (metastatic  disease, KPS < 80%, hypercalcemia, elevated LDH, and anemia).             Patient confirms she is interested in pursuing biopsy tomorrow.                         Treatment briefly discussed (TKI/VEGF inhibitor + anti-PDL1 antibody or checkpoint inhibitor/anti-CTLA4 antibody +  anti-PDL1 antibody).               Patient will require monthly Zometa to manage hypercalcemia and prevent bone related events. 2.  Hypercalcemia             Initial corrected calcium 13.2 and currently 10.56.             Improvement s/p Zometa.             Continue IV fluids. 3.  Normocytic anemia             Etiology likely secondary to renal cell carcinoma.             Patient denies any bleeding, but notes weight loss (? diet).             Ferritin 660 with an iron saturation of 11% and a TIBC of 150.  Folate 4.8 (low) with a B12 of 2351. 4.   Folate deficiency  Begin folic acid 1 mg po q day. 5.   Disposition  Anticipate discharge after CT-guided biopsy.  Patient will be followed in the oncology clinic [phone]).   Lequita Asal, MD  03/06/2020, 12:03 PM

## 2020-03-06 NOTE — Plan of Care (Signed)
Continuing with plan of care. 

## 2020-03-07 DIAGNOSIS — E876 Hypokalemia: Secondary | ICD-10-CM

## 2020-03-07 LAB — ABO/RH: ABO/RH(D): O POS

## 2020-03-07 LAB — COMPREHENSIVE METABOLIC PANEL
ALT: 10 U/L (ref 0–44)
AST: 21 U/L (ref 15–41)
Albumin: 2 g/dL — ABNORMAL LOW (ref 3.5–5.0)
Alkaline Phosphatase: 77 U/L (ref 38–126)
Anion gap: 3 — ABNORMAL LOW (ref 5–15)
BUN: 7 mg/dL — ABNORMAL LOW (ref 8–23)
CO2: 21 mmol/L — ABNORMAL LOW (ref 22–32)
Calcium: 7.6 mg/dL — ABNORMAL LOW (ref 8.9–10.3)
Chloride: 118 mmol/L — ABNORMAL HIGH (ref 98–111)
Creatinine, Ser: 0.56 mg/dL (ref 0.44–1.00)
GFR calc Af Amer: >60 mL/min (ref >60)
GFR calc non Af Amer: >60 mL/min (ref >60)
Glucose, Bld: 82 mg/dL (ref 70–99)
Potassium: 3.2 mmol/L — ABNORMAL LOW (ref 3.5–5.1)
Sodium: 142 mmol/L (ref 135–145)
Total Bilirubin: 0.5 mg/dL (ref 0.3–1.2)
Total Protein: 5.5 g/dL — ABNORMAL LOW (ref 6.5–8.1)

## 2020-03-07 LAB — CBC
HCT: 23 % — ABNORMAL LOW (ref 36.0–46.0)
Hemoglobin: 6.9 g/dL — ABNORMAL LOW (ref 12.0–15.0)
MCH: 25.7 pg — ABNORMAL LOW (ref 26.0–34.0)
MCHC: 30 g/dL (ref 30.0–36.0)
MCV: 85.8 fL (ref 80.0–100.0)
Platelets: 166 10*3/uL (ref 150–400)
RBC: 2.68 MIL/uL — ABNORMAL LOW (ref 3.87–5.11)
RDW: 15.9 % — ABNORMAL HIGH (ref 11.5–15.5)
WBC: 4.3 10*3/uL (ref 4.0–10.5)
nRBC: 0 % (ref 0.0–0.2)

## 2020-03-07 LAB — HEMOGLOBIN AND HEMATOCRIT, BLOOD
HCT: 29.1 % — ABNORMAL LOW (ref 36.0–46.0)
Hemoglobin: 8.9 g/dL — ABNORMAL LOW (ref 12.0–15.0)

## 2020-03-07 LAB — PREPARE RBC (CROSSMATCH)

## 2020-03-07 MED ORDER — POTASSIUM CHLORIDE CRYS ER 20 MEQ PO TBCR
40.0000 meq | EXTENDED_RELEASE_TABLET | Freq: Once | ORAL | Status: AC
  Administered 2020-03-07: 40 meq via ORAL
  Filled 2020-03-07: qty 2

## 2020-03-07 MED ORDER — SODIUM CHLORIDE 0.9% IV SOLUTION: Freq: Once | INTRAVENOUS | Status: AC

## 2020-03-07 NOTE — Care Management Important Message (Signed)
Important Message  Patient Details  Name: Mariah Thornton MRN: [id number] Date of Birth: 08-Dec-1945   Medicare Important Message Given:  Yes     Dannette Barbara 03/07/2020, 1:20 PM

## 2020-03-07 NOTE — Progress Notes (Signed)
PROGRESS NOTE    Mariah Thornton  TOI:[id number] DOB: 1946/07/09 DOA: 03/03/2020 PCP: Patient, No Pcp Per   Assessment & Plan:   Active Problems:   Hypercalcemia  Right kidney mass: w/ mets to bone & lung. 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 Will go for biopsy of L1 or L2 on 03/08/20 as IR unavailable to do procedure today. NPO after midnight    Left lung nodule: 2.5 cm. Likely mets from renal primary.   Hypercalcemia: d/c IVFs. S/p IV zolendronic acid. Likely secondary to underlying renal cell carcinoma. Resolved   Hypokalemia: KCl repleted. Will continue to monitor   Likely cirrhosis: w/ trace ascites & splenomegaly as per CT scan.   Normocytic anemia: will give 1 unit of pRBCs today for Hb 6.9. Repeat H&H 4 hours post transfusion. Will continue to monitor   Back pain: etiology unclear, possibly secondary to underlying malignancy. Tramadol, morphine prn for pain   Difficulty ambulating: PT/OT recs home health      DVT prophylaxis: SCDs Code Status: full  Family Communication: discussed pt's care w/ pt's husband & answered his questions again Disposition Plan: likely home health   Status is: Inpatient  Remains inpatient appropriate because:Ongoing diagnostic testing needed not appropriate for outpatient work up; will biopsy on 03/07/20   Dispo: The patient is from: Home              Anticipated d/c is to: Home w/ home health               Anticipated d/c date is: 3 days              Patient currently is not medically stable to d/c.    Consultants:  Onco   Procedures:    Antimicrobials:    Subjective: Pt c/o back pain intermittently    Objective: Vitals:   03/06/20 0403 03/06/20 1140 03/06/20 2015 03/07/20 0352  BP: (!) 131/54 (!) 127/51 (!) 154/63 (!) 155/58  Pulse: 77 75 89 88  Resp: 17 16 20 20   Temp: 98.1 F (36.7 C) 97.8 F (36.6 C) 98.5 F (36.9 C) 98.4 F (36.9 C)  TempSrc:   Oral Oral  SpO2: 97% 97% 96% 95%  Weight:      Height:         Intake/Output Summary (Last 24 hours) at 03/07/2020 0717 Last data filed at 03/07/2020 0524 Gross per 24 hour  Intake 1461.1 ml  Output --  Net 1461.1 ml   Filed Weights   03/03/20 1001 03/04/20 1356  Weight: 63 kg 63.2 kg    Examination: General exam:Appears calm and comfortable  Respiratory system:diminished breath sounds b/l. No wheezes Cardiovascular system:S1 &S2 +. No rubs, gallops or clicks.  Gastrointestinal system:Abdomen is nondistended, soft and nontender. Normal bowel sounds heard. Central nervous system:Alert and awake. Moves all 4 extremities Psychiatry:Judgement and insight appear normal. Normal mood and affect      Data Reviewed: I have personally reviewed following labs and imaging studies  CBC: Recent Labs  Lab 03/03/20 1017 03/04/20 0422 03/05/20 0613 03/06/20 0433 03/07/20 0548  WBC 5.9 4.5 4.8 3.5* 4.3  HGB 8.8* 8.4* 8.1* 7.7* 6.9*  HCT 28.5* 28.6* 26.1* 25.3* 23.0*  MCV 84.6 87.5 83.7 84.3 85.8  PLT 198 185 175 167 833   Basic Metabolic Panel: Recent Labs  Lab 03/03/20 1017 03/04/20 0422 03/05/20 0613 03/06/20 0754 03/07/20 0548  NA 137 139 137 139 142  K 4.7 4.3 4.0 4.0 3.2*  CL 103  108 108 109 118*  CO2 23 24 24 22  21*  GLUCOSE 94 81 81 78 82  BUN 12 12 9 9  7*  CREATININE 0.96 0.85 0.79 0.84 0.56  CALCIUM 12.3* 11.9* 9.9 9.2 7.6*   GFR: Estimated Creatinine Clearance: 53 mL/min (by C-G formula based on SCr of 0.56 mg/dL). Liver Function Tests: Recent Labs  Lab 03/03/20 1017 03/05/20 0613 03/06/20 0754 03/07/20 0548  AST 26 22 27 21   ALT 12 11 11 10   ALKPHOS 109 94 96 77  BILITOT 0.7 0.8 0.5 0.5  PROT 7.6 6.3* 6.4* 5.5*  ALBUMIN 2.9* 2.4* 2.4* 2.0*   No results for input(s): LIPASE, AMYLASE in the last 168 hours. No results for input(s): AMMONIA in the last 168 hours. Coagulation Profile: No results for input(s): INR, PROTIME in the last 168 hours. Cardiac Enzymes: No results for input(s): CKTOTAL, CKMB,  CKMBINDEX, TROPONINI in the last 168 hours. BNP (last 3 results) No results for input(s): PROBNP in the last 8760 hours. HbA1C: No results for input(s): HGBA1C in the last 72 hours. CBG: No results for input(s): GLUCAP in the last 168 hours. Lipid Profile: No results for input(s): CHOL, HDL, LDLCALC, TRIG, CHOLHDL, LDLDIRECT in the last 72 hours. Thyroid Function Tests: No results for input(s): TSH, T4TOTAL, FREET4, T3FREE, THYROIDAB in the last 72 hours. Anemia Panel: Recent Labs    03/06/20 0433 03/06/20 0754  VITAMINB12 2,351*  --   FOLATE  --  4.8*  FERRITIN  --  660*  TIBC  --  150*  IRON  --  16*  RETICCTPCT 2.0  --    Sepsis Labs: No results for input(s): PROCALCITON, LATICACIDVEN in the last 168 hours.  Recent Results (from the past 240 hour(s))  SARS Coronavirus 2 by RT PCR (hospital order, performed in [facility] hospital lab) Nasopharyngeal Nasopharyngeal Swab     Status: None   Collection Time: 03/03/20  5:44 PM   Specimen: Nasopharyngeal Swab  Result Value Ref Range Status   SARS Coronavirus 2 NEGATIVE NEGATIVE Final    Comment: (NOTE) SARS-CoV-2 target nucleic acids are NOT DETECTED.  The SARS-CoV-2 RNA is generally detectable in upper and lower respiratory specimens during the acute phase of infection. The lowest concentration of SARS-CoV-2 viral copies this assay can detect is 250 copies / mL. A negative result does not preclude SARS-CoV-2 infection and should not be used as the sole basis for treatment or other patient management decisions.  A negative result may occur with improper specimen collection / handling, submission of specimen other than nasopharyngeal swab, presence of viral mutation(s) within the areas targeted by this assay, and inadequate number of viral copies (<250 copies / mL). A negative result must be combined with clinical observations, patient history, and epidemiological information.  Fact Sheet for Patients:     StrictlyIdeas.no  Fact Sheet for Healthcare Providers: BankingDealers.co.za  This test is not yet approved or  cleared by the [place] FDA and has been authorized for detection and/or diagnosis of SARS-CoV-2 by FDA under an Emergency Use Authorization (EUA).  This EUA will remain in effect (meaning this test can be used) for the duration of the COVID-19 declaration under Section 564(b)(1) of the Act, 21 U.S.C. section 360bbb-3(b)(1), unless the authorization is terminated or revoked sooner.  Performed at [facility], [address], [place], [place] [address]          Radiology Studies: No results found.      Scheduled Meds: . sodium chloride  Intravenous Once  . docusate sodium  200 mg Oral BID  . potassium chloride  40 mEq Oral Once  . sodium chloride flush  3 mL Intravenous Q12H   Continuous Infusions:    LOS: 4 days    Time spent: 31 mins     Wyvonnia Dusky, MD [facility] Pager [phone]  If 7PM-7AM, please contact night-coverage [email or web address] 03/07/2020, 7:17 AM

## 2020-03-07 NOTE — TOC Initial Note (Signed)
Transition of Care [facility]) - Initial/Assessment Note    Patient Details  Name: Mariah STIRN MRN: [id number] Date of Birth: [birth year]  Transition of [facility]) CM/SW Contact:    Candie Chroman, LCSW Phone Number: 03/07/2020, 1:41 PM  Clinical Narrative:  CSW met with patient. Husband at bedside. CSW introduced role and explained that PT recommendations would be discussed. Patient declining home health at this time. She reports her husband provides all the help she needs. Patient does not have a PCP. First preference is [facility] but they are not accepting new patients at this time. Will follow up with other options. No further concerns. CSW encouraged patient and her husband to contact CSW as needed. CSW will continue to follow patient and her husband for support and facilitate return home when stable.               Expected Discharge Plan: Home/Self Care Barriers to Discharge: Continued Medical Work up   Patient Goals and CMS Choice        Expected Discharge Plan and Services Expected Discharge Plan: Home/Self Care     Post Acute Care Choice: NA Living arrangements for the past 2 months: Single Family Home                                      Prior Living Arrangements/Services Living arrangements for the past 2 months: Single Family Home Lives with:: Spouse Patient language and need for interpreter reviewed:: Yes Do you feel safe going back to the place where you live?: Yes      Need for Family Participation in Patient Care: Yes (Comment) Care giver support system in place?: Yes (comment)   Criminal Activity/Legal Involvement Pertinent to Current Situation/Hospitalization: No - Comment as needed  Activities of Daily Living Home Assistive Devices/Equipment: None ADL Screening (condition at time of admission) Patient's cognitive ability adequate to safely complete daily activities?: Yes Is the patient deaf or have difficulty hearing?:  No Does the patient have difficulty seeing, even when wearing glasses/contacts?: Yes Does the patient have difficulty concentrating, remembering, or making decisions?: Yes Patient able to express need for assistance with ADLs?: Yes Does the patient have difficulty dressing or bathing?: Yes Independently performs ADLs?: No Communication: Independent Dressing (OT): Needs assistance Is this a change from baseline?: Change from baseline, expected to last >3 days Grooming: Needs assistance Is this a change from baseline?: Change from baseline, expected to last >3 days Feeding: Independent Bathing: Needs assistance Is this a change from baseline?: Change from baseline, expected to last >3 days Toileting: Needs assistance Is this a change from baseline?: Change from baseline, expected to last >3days In/Out Bed: Needs assistance Is this a change from baseline?: Change from baseline, expected to last >3 days Walks in Home: Independent Does the patient have difficulty walking or climbing stairs?: Yes Weakness of Legs: Both Weakness of Arms/Hands: Both  Permission Sought/Granted Permission sought to share information with : Family Supports Permission granted to share information with : Yes, Verbal Permission Granted  Share Information with NAME: Gregg Holster     Permission granted to share info w Relationship: Husband  Permission granted to share info w Contact Information: [phone]  Emotional Assessment Appearance:: Appears stated age Attitude/Demeanor/Rapport: Engaged, Gracious Affect (typically observed): Accepting, Appropriate, Calm, Pleasant Orientation: : Oriented to Self, Oriented to Place, Oriented to  Time, Oriented to Situation Alcohol /  Substance Use: Not Applicable Psych Involvement: No (comment)  Admission diagnosis:  Hypercalcemia [E83.52] Weakness [R53.1] Patient Active Problem List   Diagnosis Date Noted  . Hypercalcemia 03/03/2020   PCP:  Patient, No Pcp  Per Pharmacy:   [facility], [address] [place] [address] Phone: [phone] Fax: [phone]     Social Determinants of Health (SDOH) Interventions    Readmission Risk Interventions No flowsheet data found.

## 2020-03-07 NOTE — Progress Notes (Signed)
Physical Therapy Treatment Patient Details Name: Mariah Thornton MRN: [id number] DOB: Jul 15, 1945 Today's Date: 03/07/2020    History of Present Illness Pt is a 74 yo female that presented to ED from [facility] for back pain, weakness, potential confusion, as well as difficulty walking. Was recently seen for similar complaints, diagnosed with PNA and sent home on antibiotics.  MD assessment includes: Right kidney mass: w/ mets to bone & lung. Likely renal cell carcinoma, will go for biopsy of L1 or L2 on 03/08/20, Hypercalcemia, Hypokalemia, Likely cirrhosis, normocytic anemia with 1 unit PRBC 8/30, back pain, and difficulty ambulating.    PT Comments    Of note, pt's most recent Hgb 6.9 but pt received 1 unit after that reading was taken.  Per nursing, ok for PT to work with patient.  Pt was pleasant and motivated to participate during the session.  Pt reported no pain at onset of session but mentioned chronic back pain with movement.  Log roll training education provided for sidelying to sit with no adverse symptoms noted.  Pt was able to amb 80 feet with a RW this session with slow cadence but was steady without LOB or adverse symptoms.  Pt will benefit from HHPT services upon discharge to safely address deficits listed in patient problem list for decreased caregiver assistance and eventual return to PLOF.      Follow Up Recommendations  Home health PT;Supervision for mobility/OOB     Equipment Recommendations  Rolling walker with 5" wheels    Recommendations for Other Services       Precautions / Restrictions Precautions Precautions: Fall Restrictions Weight Bearing Restrictions: No    Mobility  Bed Mobility Overal bed mobility: Needs Assistance Bed Mobility: Rolling;Sidelying to Sit Rolling: Supervision Sidelying to sit: Min assist       General bed mobility comments: Pt education provided on log roll technique to address chronic back pain with  mobility  Transfers Overall transfer level: Needs assistance Equipment used: Rolling walker (2 wheeled) Transfers: Sit to/from Stand Sit to Stand: Min guard            Ambulation/Gait Ambulation/Gait assistance: Min guard Gait Distance (Feet): 80 Feet Assistive device: Rolling walker (2 wheeled) Gait Pattern/deviations: Step-through pattern;Decreased step length - right;Decreased step length - left;Trunk flexed Gait velocity: decreased   General Gait Details: Slow cadence but steady without LOB or adverse symptoms   Stairs             Wheelchair Mobility    Modified Rankin (Stroke Patients Only)       Balance Overall balance assessment: Needs assistance Sitting-balance support: Feet supported Sitting balance-Leahy Scale: Good     Standing balance support: During functional activity;Bilateral upper extremity supported Standing balance-Leahy Scale: Fair                              Cognition Arousal/Alertness: Awake/alert Behavior During Therapy: WFL for tasks assessed/performed Overall Cognitive Status: Within Functional Limits for tasks assessed                                        Exercises Total Joint Exercises Ankle Circles/Pumps: AROM;Strengthening;Both;10 reps Quad Sets: Strengthening;Both;10 reps Gluteal Sets: Strengthening;Both;10 reps Heel Slides: AAROM;Both;10 reps Straight Leg Raises: AAROM;Both;10 reps Long Arc Quad: AROM;Strengthening;Both;10 reps Marching in Standing: AROM;Both;5 reps;Standing Other Exercises Other Exercises: HEP education  for BLE APs, QS, and GS x 10 each 5-6x/day    General Comments        Pertinent Vitals/Pain Pain Assessment: No/denies pain    Home Living                      Prior Function            PT Goals (current goals can now be found in the care plan section) Progress towards PT goals: Progressing toward goals    Frequency    Min 2X/week      PT  Plan Current plan remains appropriate    Co-evaluation              AM-PAC PT "6 Clicks" Mobility   Outcome Measure  Help needed turning from your back to your side while in a flat bed without using bedrails?: A Little Help needed moving from lying on your back to sitting on the side of a flat bed without using bedrails?: A Little Help needed moving to and from a bed to a chair (including a wheelchair)?: A Little Help needed standing up from a chair using your arms (e.g., wheelchair or bedside chair)?: A Little Help needed to walk in hospital room?: A Little Help needed climbing 3-5 steps with a railing? : A Little 6 Click Score: 18    End of Session Equipment Utilized During Treatment: Gait belt Activity Tolerance: Patient tolerated treatment well Patient left: in chair;with call bell/phone within reach;with chair alarm set;with family/visitor present Nurse Communication: Mobility status PT Visit Diagnosis: History of falling (Z91.81);Pain;Difficulty in walking, not elsewhere classified (R26.2);Muscle weakness (generalized) (M62.81)     Time: 0938-1829 PT Time Calculation (min) (ACUTE ONLY): 23 min  Charges:  $Gait Training: 8-22 mins $Therapeutic Exercise: 8-22 mins                     D. Scott Syrus Nakama PT, DPT 03/07/20, 4:43 PM

## 2020-03-07 NOTE — Consult Note (Addendum)
Chief Complaint: Patient was seen in consultation today for image guided L2 lytic lesion versus lymph node versus lung nodule biopsy Chief Complaint  Patient presents with  . Weakness    Referring Physician(s): Corcoran,M  Supervising Physician: Markus Daft  Patient Status: [facility] - In-pt  History of Present Illness: Mariah Thornton is a 74 y.o. female, nonsmoker, with no significant past medical history who recently presented to [facility] with generalized weakness, weight loss and lower back pain.  She denies any recent trauma but states she has fallen at home and does have some difficulty walking.  Subsequent imaging has revealed multiple pulmonary nodules, thoracic and retroperitoneal adenopathy,? malignant involvement of the right adrenal gland, large right renal mass, multiple lytic osseous lesions involving L2 and L3 vertebral bodies, small right and trace left pleural effusions, cirrhosis, splenomegaly, trace ascites and cholelithiasis.  Request now received from oncology for image guided biopsy of L2 lesion/ most accessible lesion.COVID-19 neg.       History reviewed. No pertinent past medical history.  History reviewed. No pertinent surgical history.  Allergies: Patient has no known allergies.  Medications: Prior to Admission medications   Medication Sig Start Date End Date Taking? Authorizing Provider  azithromycin (ZITHROMAX) 250 MG tablet TAKE 1 TABLET BY MOUTH DAILY FOR 4 DAYS 02/27/20  Yes [provider]     No family history on file.  Social History   Socioeconomic History  . Marital status: Married    Spouse name: Not on file  . Number of children: Not on file  . Years of education: Not on file  . Highest education level: Not on file  Occupational History  . Not on file  Tobacco Use  . Smoking status: Never Smoker  . Smokeless tobacco: Never Used  Substance and Sexual Activity  . Alcohol use: Not Currently  . Drug use: Not Currently  . Sexual  activity: Not on file  Other Topics Concern  . Not on file  Social History Narrative  . Not on file   Social Determinants of Health   Financial Resource Strain:   . Difficulty of Paying Living Expenses: Not on file  Food Insecurity:   . Worried About Charity fundraiser in the Last Year: Not on file  . Ran Out of Food in the Last Year: Not on file  Transportation Needs:   . Lack of Transportation (Medical): Not on file  . Lack of Transportation (Non-Medical): Not on file  Physical Activity:   . Days of Exercise per Week: Not on file  . Minutes of Exercise per Session: Not on file  Stress:   . Feeling of Stress : Not on file  Social Connections:   . Frequency of Communication with Friends and Family: Not on file  . Frequency of Social Gatherings with Friends and Family: Not on file  . Attends Religious Services: Not on file  . Active Member of Clubs or Organizations: Not on file  . Attends Archivist Meetings: Not on file  . Marital Status: Not on file      Review of Systems see above ;currently denies fever, headache, chest pain, dyspnea, cough, abdominal pain, nausea, vomiting or bleeding.  Vital Signs: BP (!) 155/58 (BP Location: Right Arm)   Pulse 88   Temp 98.4 F (36.9 C) (Oral)   Resp 18   Ht 5\' 2"  (1.575 m)   Wt 139 lb 5.3 oz (63.2 kg)   SpO2 95%   BMI 25.48 kg/m  Physical Exam awake, alert.  Chest with slightly diminished breath sounds bases.  Heart with regular rate and rhythm.  Abdomen soft, sl dist, positive bowel sounds, currently nontender.  No lower extremity edema.  Imaging: DG Chest 2 View  Result Date: 03/03/2020 CLINICAL DATA:  Weakness, syncope EXAM: CHEST - 2 VIEW COMPARISON:  02/26/2020 FINDINGS: Right lung is grossly clear. Left lower lobe 2.5 cm suspected lung nodule. Previously noted possible left upper lobe lung nodule is not well seen today. Stable cardiomediastinal silhouette. No pneumothorax. Trace pleural effusions.  IMPRESSION: 1. Trace pleural effusions. 2. Left lower lobe 2.5 cm suspected lung nodule. Chest CT is recommended for further evaluation. Electronically Signed   By: Donavan Foil M.D.   On: 03/03/2020 16:40   DG Chest 2 View  Result Date: 02/26/2020 CLINICAL DATA:  Weakness EXAM: CHEST - 2 VIEW COMPARISON:  None. FINDINGS: The lungs are symmetrically expanded. There is focal lingular infiltrate, possibly infectious in the appropriate clinical setting. 11 mm nodule is seen within the a left upper lobe, indeterminate. No pneumothorax. Tiny right pleural effusion is present. Cardiac size within normal limits. Pulmonary vascularity is normal. No acute bone abnormality. IMPRESSION: 1. Lingular infiltrate, possibly infectious in the appropriate clinical setting. 2. Tiny right pleural effusion. 3. 11 mm nodule in the left upper lobe, indeterminate. Follow-up chest radiograph is recommended in 6 weeks, once the patient's acute issues have resolved. If persistent, this could be further assessed with dedicated CT imaging. Electronically Signed   By: Fidela Salisbury MD   On: 02/26/2020 23:05   CT Head Wo Contrast  Result Date: 02/26/2020 CLINICAL DATA:  Weakness, altered level of consciousness EXAM: CT HEAD WITHOUT CONTRAST TECHNIQUE: Contiguous axial images were obtained from the base of the skull through the vertex without intravenous contrast. COMPARISON:  None. FINDINGS: Brain: Confluent hypodensities within the periventricular white matter are most consistent with chronic small vessel ischemic changes. No signs of acute infarct or hemorrhage. Lateral ventricles and remaining midline structures are unremarkable. No acute extra-axial fluid collections. No mass effect. Vascular: No hyperdense vessel or unexpected calcification. Skull: Multiple nonspecific soft tissue nodules are seen within the scalp, largest along the left frontal convexity measuring 2.9 cm. Etiology is indeterminate, correlation with physical exam  is recommended. Underlying calvarium is unremarkable. No acute fracture. Sinuses/Orbits: No acute finding. Other: None. IMPRESSION: 1. Chronic small vessel ischemic change throughout the white matter. No acute intracranial process. 2. Multiple soft tissue nodules within the scalp, largest measuring 2.9 cm along the left frontal convexity. Etiology indeterminate. Please correlate with physical exam findings. Electronically Signed   By: Randa Ngo M.D.   On: 02/26/2020 22:38   CT CHEST WO CONTRAST  Result Date: 03/03/2020 CLINICAL DATA:  Lung nodule, hypercalcemia EXAM: CT CHEST WITHOUT CONTRAST TECHNIQUE: Multidetector CT imaging of the chest was performed following the standard protocol without IV contrast. COMPARISON:  None. FINDINGS: Cardiovascular: Cardiac size is within normal limits. No significant coronary artery calcification. No pericardial effusion. The central pulmonary arteries are enlarged in keeping with changes of pulmonary arterial hypertension. The thoracic aorta is unremarkable. Mediastinum/Nodes: There is pathologic right paratracheal adenopathy with a a single lymph node measuring 15 mm x 20 mm at axial image # 54/2. Esophagus unremarkable. Thyroid unremarkable. Lungs/Pleura: There are multiple pulmonary nodules identified within the lungs bilaterally demonstrating a basilar predominance. The nodule noted on prior chest radiograph represents the dominant nodule measuring 24 mm x 24 mm at axial image # 101/3. There are at least  20 such nodule seen scattered throughout the lungs. Small right pleural effusion is present. The appearance and distribution are suspicious for metastatic disease. A primary pulmonary mass with intra pulmonary metastases is considered less likely. Small right pleural effusion is present. No pneumothorax. No central obstructing mass. Upper Abdomen: Cholelithiasis. There is nodular enlargement of the right adrenal gland, suspicious for metastatic involvement. There is  pathologic periaortic and retrocrural adenopathy with the index lymph node measuring 13 mm in short axis diameter at axial image # 128/2 Musculoskeletal: There are 3 possible subcentimeter lytic lesions identified within the visualized portion of the L2 vertebral body involving the anterior aspect the vertebral body as well as the right pedicle. This is incompletely included on this examination and not well assessed. No other lytic or blastic bone lesion is identified. 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 Possible lytic lesion within the L2 vertebral body. The organ of origin is not clearly identified on this examination. Small right pleural effusion is nonspecific though a malignant pleural effusion must be considered. Completion examination of the abdomen and pelvis with contrast and possible PET CT examination may be helpful for further evaluation. Cholelithiasis. Enlarged central pulmonary arteries in keeping with changes of pulmonary arterial hypertension. Electronically Signed   By: Fidela Salisbury MD   On: 03/03/2020 19:17   US SOFT TISSUE HEAD & NECK (NON-THYROID)  Result Date: 02/20/2020 CLINICAL DATA:  74 year old female with a history of neck lump EXAM: ULTRASOUND OF HEAD/NECK SOFT TISSUES TECHNIQUE: Ultrasound examination of the head and neck soft tissues was performed in the area of clinical concern. COMPARISON:  None. FINDINGS: Grayscale and color duplex performed in the region clinical concern. There is a lobulated relatively homogeneously hypoechoic soft tissue mass in the posterior low neck measuring 5.3 cm x 2.7 cm x 3.4 cm. Internal blood flow documented. The margin is well-defined, overlying the musculature. No adenopathy. IMPRESSION: Soft tissue mass in the posterior  neck measures 5.3 cm. Liposarcoma or other malignancy not excluded and further evaluation with contrast-enhanced neck CT is recommended, as well as referral for surgical evaluation. Electronically Signed   By: Corrie Mckusick D.O.   On: 02/20/2020 13:29   CT ABDOMEN PELVIS W CONTRAST  Result Date: 03/04/2020 CLINICAL DATA:  Pulmonary nodules, metastatic disease suspected, primary malignancy search and staging EXAM: CT ABDOMEN AND PELVIS WITH CONTRAST TECHNIQUE: Multidetector CT imaging of the abdomen and pelvis was performed using the standard protocol following bolus administration of intravenous contrast. CONTRAST:  76mL OMNIPAQUE IOHEXOL 300 MG/ML  SOLN COMPARISON:  CT chest, 03/04/2019 FINDINGS: Lower chest: Small right, trace left pleural effusions and multiple bilateral pulmonary nodules, better evaluated by recent prior CT of the chest. Hepatobiliary: Somewhat shrunken, coarse and nodular contour of the liver. Gallstones. No gallbladder wall thickening, or biliary dilatation. Pancreas: Unremarkable. No pancreatic ductal dilatation or surrounding inflammatory changes. Spleen: Mild splenomegaly, maximum coronal span 13.9 cm. Adrenals/Urinary Tract: Adrenal glands are unremarkable. There is a large, heterogeneous mass arising from the anterior right kidney measuring approximately 8.8 x 8.5 x 7.5 cm (series 5, image 40). Bladder is unremarkable. Stomach/Bowel: Stomach is within normal limits. Appendix appears normal. No evidence of bowel wall thickening, distention, or inflammatory changes. Vascular/Lymphatic: Aortic atherosclerosis. Numerous enlarged retroperitoneal lymph nodes measuring up to 2.6 x 1.2 cm (series 2, image 32). Reproductive: No  mass or other significant abnormality. Other: No abdominal wall hernia or abnormality. Trace ascites throughout the abdomen and pelvis. Musculoskeletal: There are multiple lytic osseous lesions involving the L2 and L3 vertebral bodies with clear cortical and medullary  destruction (series 2, image 34). There are multiple additional more subtle osseous lucencies throughout the spine and pelvis, which are suspicious for very subtle osseous metastatic disease. IMPRESSION: 1. There is a large, heterogeneous mass arising from the anterior right kidney measuring approximately 8.8 x 8.5 x 7.5 cm. Numerous enlarged retroperitoneal lymph nodes. 2. Multiple lytic osseous lesions involving the L2 and L3 vertebral bodies with clear cortical and medullary destruction. There are multiple additional more subtle osseous lucencies throughout the spine and pelvis, which are suspicious for very subtle osseous metastatic disease. 3. Small right, trace left pleural effusions and multiple bilateral pulmonary nodules, better evaluated by recent prior CT of the chest. 4. 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 5. Stigmata of cirrhosis and splenomegaly. 6. Trace ascites. 7. Cholelithiasis. 8. Aortic Atherosclerosis (ICD10-I70.0). Electronically Signed   By: Eddie Candle M.D.   On: 03/04/2020 13:36    Labs:  CBC: Recent Labs    03/04/20 0422 03/05/20 0613 03/06/20 0433 03/07/20 0548  WBC 4.5 4.8 3.5* 4.3  HGB 8.4* 8.1* 7.7* 6.9*  HCT 28.6* 26.1* 25.3* 23.0*  PLT 185 175 167 166    COAGS: No results for input(s): INR, APTT in the last 8760 hours.  BMP: Recent Labs    03/04/20 0422 03/05/20 0613 03/06/20 0754 03/07/20 0548  NA 139 137 139 142  K 4.3 4.0 4.0 3.2*  CL 108 108 109 118*  CO2 24 24 22  21*  GLUCOSE 81 81 78 82  BUN 12 9 9  7*  CALCIUM 11.9* 9.9 9.2 7.6*  CREATININE 0.85 0.79 0.84 0.56  GFRNONAA >60 >60 >60 >60  GFRAA >60 >60 >60 >60    LIVER FUNCTION TESTS: Recent Labs    03/03/20 1017 03/05/20 0613 03/06/20 0754 03/07/20 0548  BILITOT 0.7 0.8 0.5 0.5  AST 26 22 27 21   ALT 12 11 11 10   ALKPHOS 109 94 96 77  PROT 7.6 6.3* 6.4* 5.5*  ALBUMIN 2.9* 2.4* 2.4* 2.0*    TUMOR  MARKERS: No results for input(s): AFPTM, CEA, CA199, CHROMGRNA in the last 8760 hours.  Assessment and Plan: 74 y.o. female, nonsmoker, with no significant past medical history who recently presented to [facility] with generalized weakness, weight loss and lower back pain.  She denies any recent trauma but states she has fallen at home and does have some difficulty walking.  Subsequent imaging has revealed multiple pulmonary nodules, thoracic and retroperitoneal adenopathy,? malignant involvement of the right adrenal gland, large right renal mass, multiple lytic osseous lesions involving L2 and L3 vertebral bodies, small right and trace left pleural effusions, cirrhosis, splenomegaly, trace ascites and cholelithiasis.  Request now received from oncology for image guided biopsy of L2 lesion/ most accessible lesion.COVID-19 neg. Imaging studies have been reviewed by Dr. Laurence Ferrari.  Plan at this time is to proceed with image guided biopsy of most accessible lesion-either L2 lytic lesion versus lymph node versus pulmonary nodule.  Details/risks of procedures, including but not limited to, internal bleeding, infection, injury to adjacent structures d/w with patient and spouse with their understanding and consent.  Procedure scheduled for 8/31.  Current labs include WBC 4.3, hgb 6.9- to be transfused today, platelets 166k, K 3.2, creat 0.56, PT/INR pend, COVID  19 neg.    Thank you for this interesting consult.  I greatly enjoyed meeting Mariah Thornton and look forward to participating in their care.  A copy of this report was sent to the requesting provider on this date.  Electronically Signed: D. Rowe Robert, PA-C 03/07/2020, 11:53 AM    I spent a total of 30 minutes   in face to face in clinical consultation, greater than 50% of which was counseling/coordinating care for image guided biopsy of L2 lytic lesion versus lymph node versus pulmonary nodule

## 2020-03-08 ENCOUNTER — Inpatient Hospital Stay: Payer: Medicare Other

## 2020-03-08 LAB — TYPE AND SCREEN
ABO/RH(D): O POS
Antibody Screen: NEGATIVE
Unit division: 0

## 2020-03-08 LAB — COMPREHENSIVE METABOLIC PANEL
ALT: 11 U/L (ref 0–44)
AST: 23 U/L (ref 15–41)
Albumin: 2.4 g/dL — ABNORMAL LOW (ref 3.5–5.0)
Alkaline Phosphatase: 90 U/L (ref 38–126)
Anion gap: 5 (ref 5–15)
BUN: 8 mg/dL (ref 8–23)
CO2: 21 mmol/L — ABNORMAL LOW (ref 22–32)
Calcium: 8.7 mg/dL — ABNORMAL LOW (ref 8.9–10.3)
Chloride: 113 mmol/L — ABNORMAL HIGH (ref 98–111)
Creatinine, Ser: 0.72 mg/dL (ref 0.44–1.00)
GFR calc Af Amer: >60 mL/min (ref >60)
GFR calc non Af Amer: >60 mL/min (ref >60)
Glucose, Bld: 93 mg/dL (ref 70–99)
Potassium: 3.8 mmol/L (ref 3.5–5.1)
Sodium: 139 mmol/L (ref 135–145)
Total Bilirubin: 0.9 mg/dL (ref 0.3–1.2)
Total Protein: 6.4 g/dL — ABNORMAL LOW (ref 6.5–8.1)

## 2020-03-08 LAB — CBC
HCT: 28.8 % — ABNORMAL LOW (ref 36.0–46.0)
Hemoglobin: 9.2 g/dL — ABNORMAL LOW (ref 12.0–15.0)
MCH: 26.4 pg (ref 26.0–34.0)
MCHC: 31.9 g/dL (ref 30.0–36.0)
MCV: 82.5 fL (ref 80.0–100.0)
Platelets: 166 10*3/uL (ref 150–400)
RBC: 3.49 MIL/uL — ABNORMAL LOW (ref 3.87–5.11)
RDW: 16.1 % — ABNORMAL HIGH (ref 11.5–15.5)
WBC: 4.8 10*3/uL (ref 4.0–10.5)
nRBC: 0 % (ref 0.0–0.2)

## 2020-03-08 LAB — PROTIME-INR
INR: 1.3 — ABNORMAL HIGH (ref 0.8–1.2)
Prothrombin Time: 15.5 seconds — ABNORMAL HIGH (ref 11.4–15.2)

## 2020-03-08 LAB — BPAM RBC
Blood Product Expiration Date: 202109222359
ISSUE DATE / TIME: 202108301153
Unit Type and Rh: 5100

## 2020-03-08 IMAGING — CT CT BIOPSY
2 of 5 series · 12 of 32 positions shown, 18 images · non-contrast
Comparison: none

INDICATION: 74-year-old with presumed metastatic disease. Patient has a large
right renal lesion, lung nodules and scattered bone lesions. Patient
was initially scheduled for a L2 bone lesion biopsy. Patient was
noted to have a large soft tissue mass in the posterior neck after
she was placed prone on the CT scanner. Discussed with Dr. BLAIN
in Oncology and we decided to proceed with biopsy of this neck soft
tissue mass.

[Series 2: i-spiral 5.0 b30f · axial · 0.71mm/px · z∈[-181,-83]mm · 9 of 36 slices shown, 15 images (1 of 2)]
[im 4/36  soft-tissue]
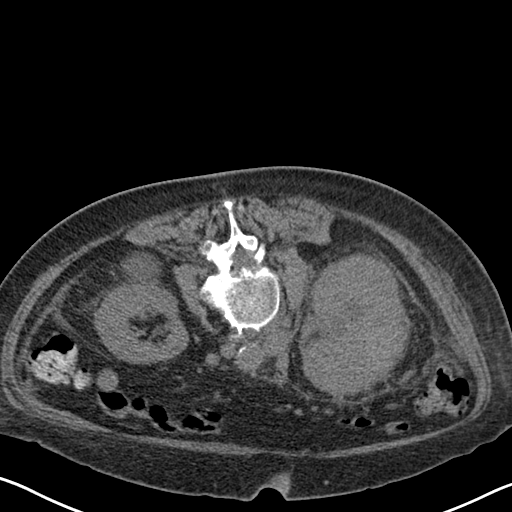
[im 4/36  bone]
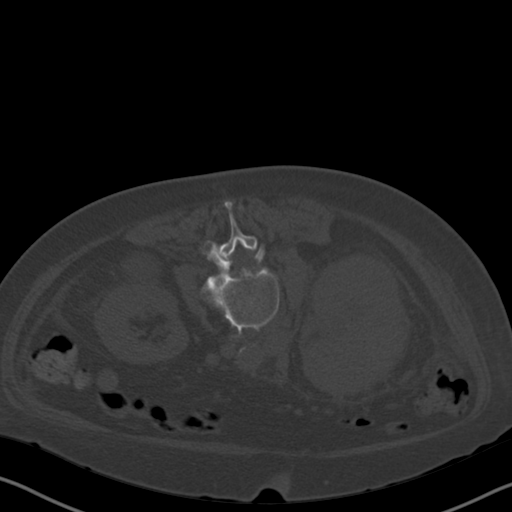
[im 8/36  soft-tissue]
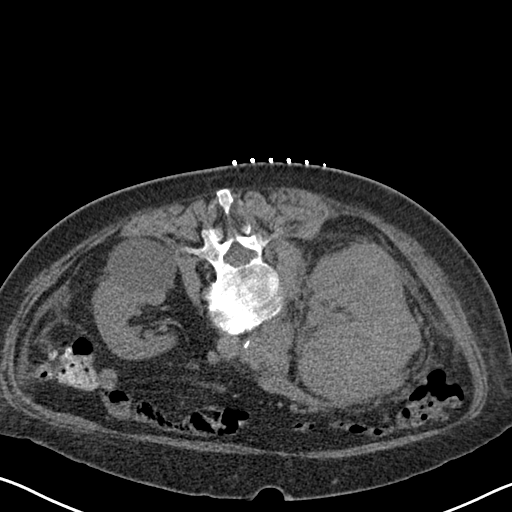
[im 11/36  soft-tissue]
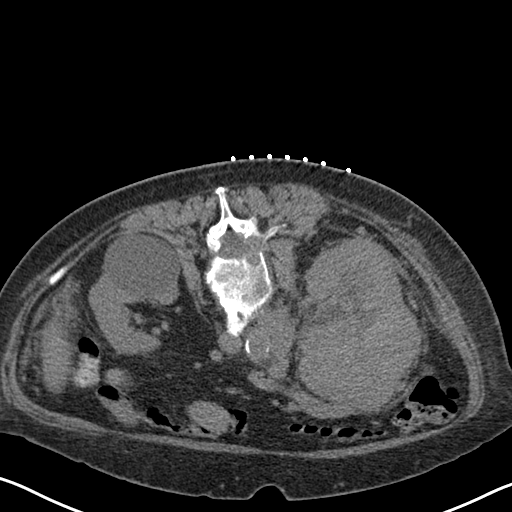
[im 15/36  soft-tissue]
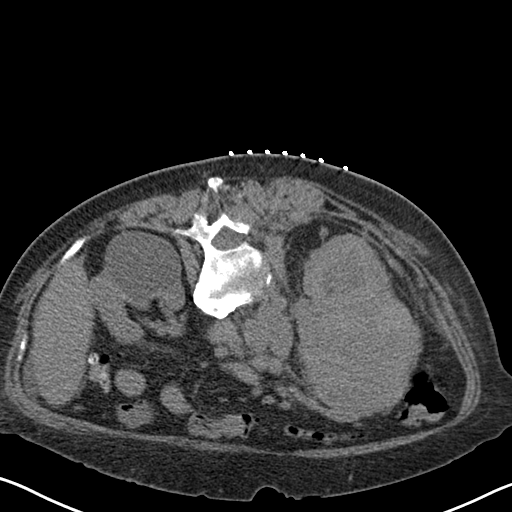
[im 18/36  soft-tissue]
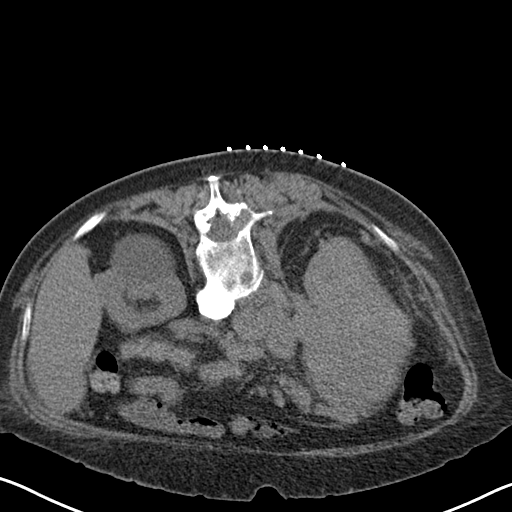
[im 22/36  soft-tissue]
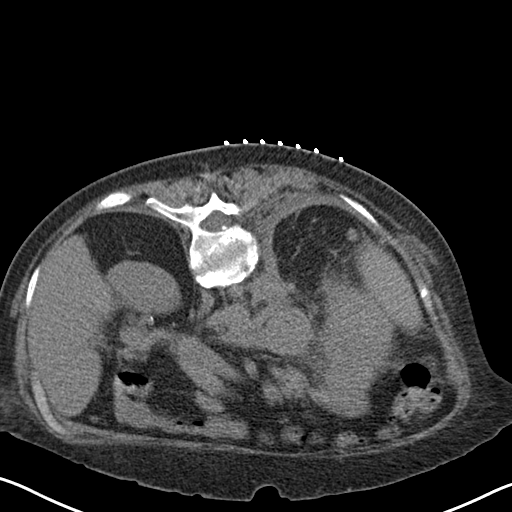
[im 22/36  lung]
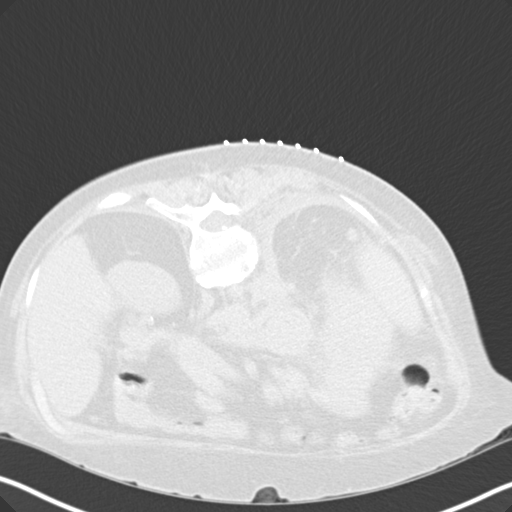
[im 25/36  soft-tissue]
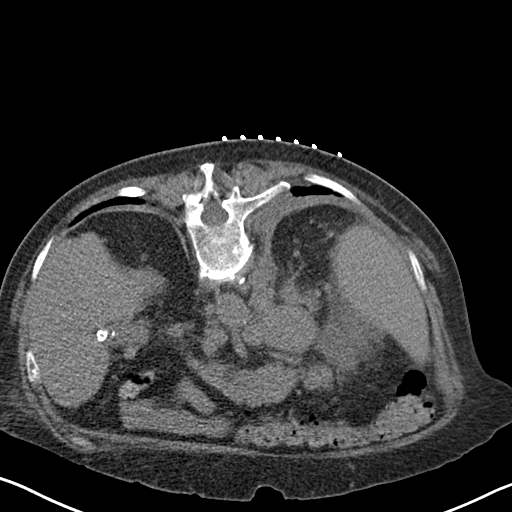
[im 25/36  lung]
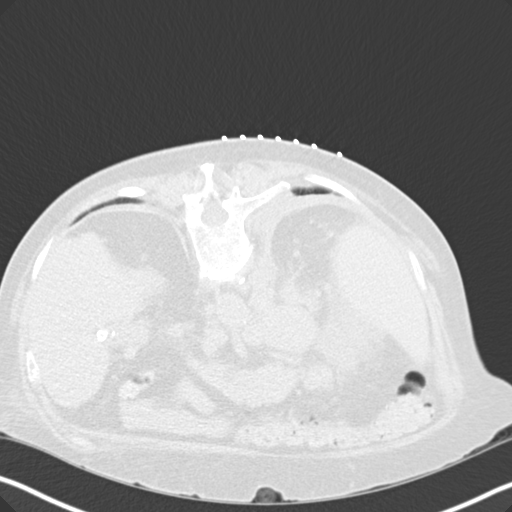
[im 29/36  soft-tissue]
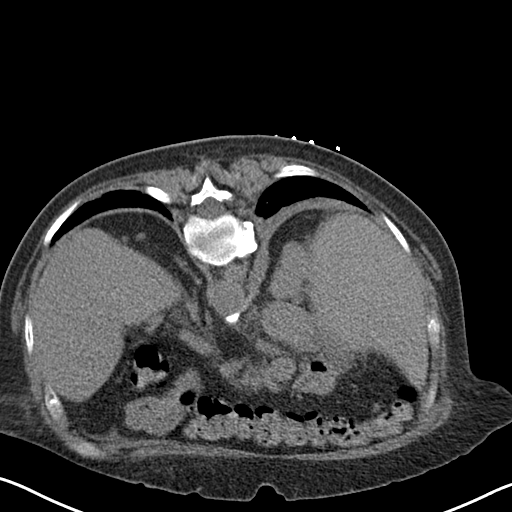
[im 29/36  lung]
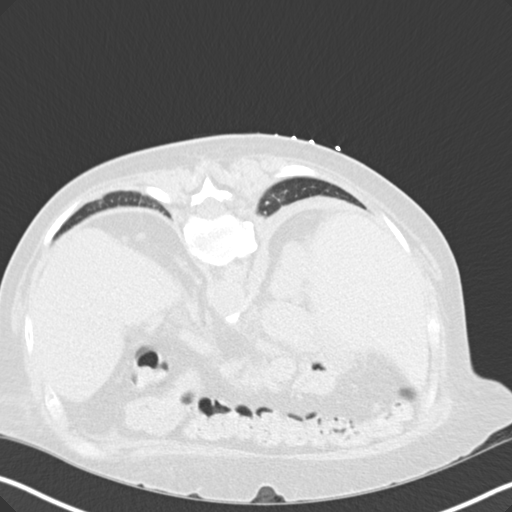
[im 32/36  soft-tissue]
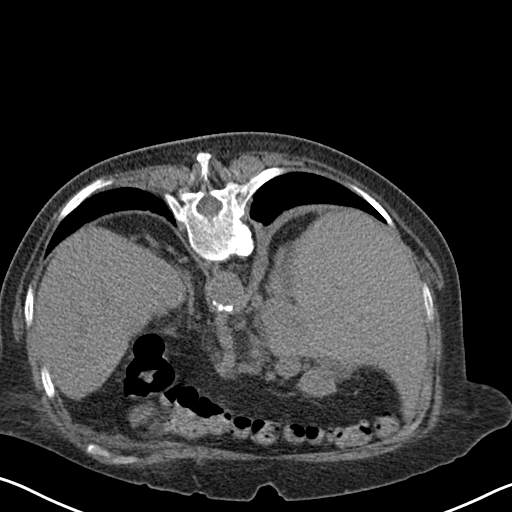
[im 32/36  lung]
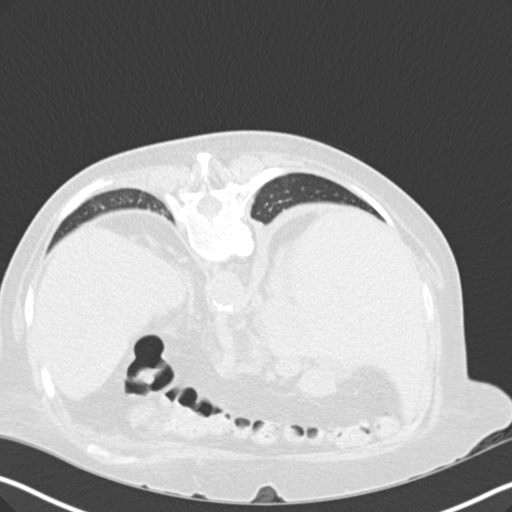
[im 32/36  bone]
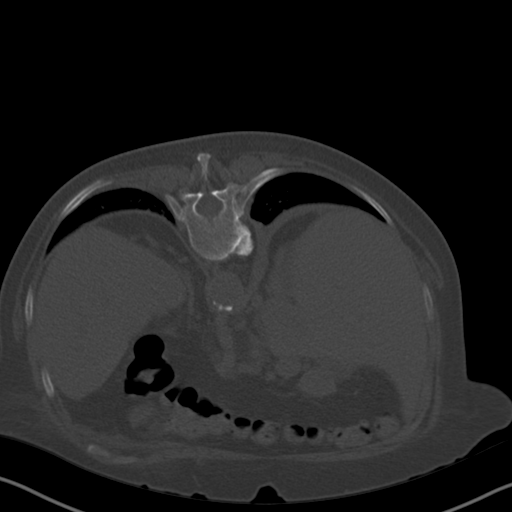

[Series 4: i-spiral 5.0 b30f · axial · 0.54mm/px · z∈[+122,+146]mm · 3 of 32 slices shown (2 of 2)]
[im 4/32  soft-tissue]
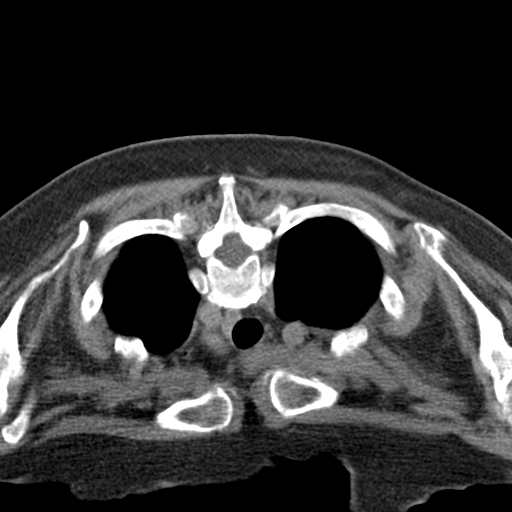
[im 7/32  soft-tissue]
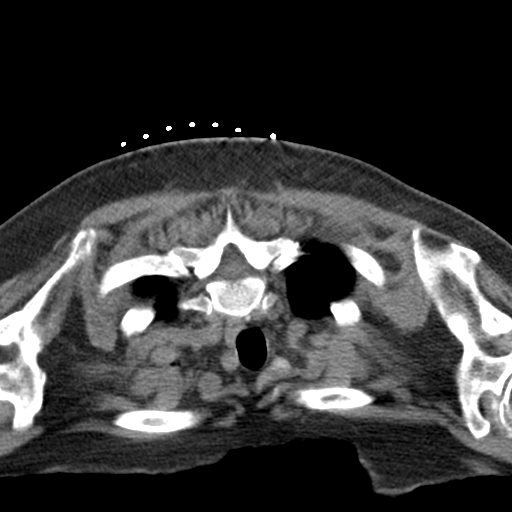
[im 11/32  soft-tissue]
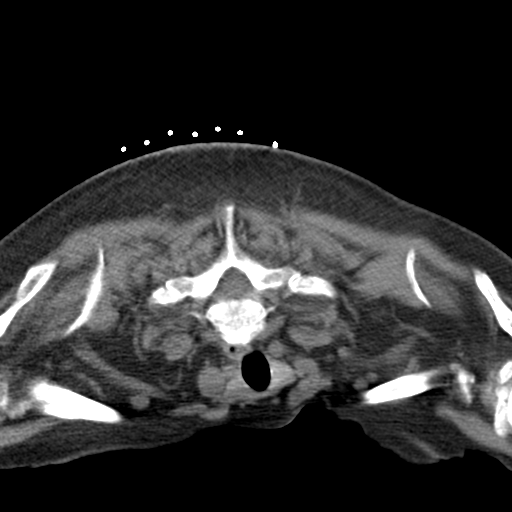

[12 of 32 positions shown; findings below may reference images not displayed]

EXAM:
CT-GUIDED CORE BIOPSY OF POSTERIOR NECK SOFT TISSUE MASS

MEDICATIONS:
None.

ANESTHESIA/SEDATION:
Moderate (conscious) sedation was employed during this procedure. A
total of Versed 1.0 mg and Fentanyl 50 mcg was administered
intravenously.

Moderate Sedation Time: 16 minutes. The patient's level of
consciousness and vital signs were monitored continuously by
radiology nursing throughout the procedure under my direct
supervision.

FLUOROSCOPY TIME:  None

COMPLICATIONS:
None immediate.

PROCEDURE:
Informed written consent was obtained from the patient after a
thorough discussion of the procedural risks, benefits and
alternatives. All questions were addressed. A timeout was performed
prior to the initiation of the procedure.

Patient was placed prone and CT images were obtained. Initially, CT
images were obtained of the abdomen. After examination of the
patient and discovery of a left large soft tissue mass in the
posterior neck, we got CT imaging of the neck. CT confirmed a large
superficial soft tissue mass in the posterior neck. This corresponds
with the soft tissue mass on the previous ultrasound from
[DATE]. Informed consent was obtained for biopsy of the soft
tissue mass from the patient. The right side of the mass and
adjacent skin was prepped with chlorhexidine and sterile field was
created. Skin and soft tissues were anesthetized using 1% lidocaine.
Using CT guidance, a 17 gauge coaxial needle was directed into the
lesion. Multiple core biopsies were obtained with an 18 gauge core
device. Specimens placed in formalin. Needle was removed without
complication. Bandage placed over the puncture site.
FINDINGS: Large soft tissue mass in the superficial posterior soft tissues of
the neck. This lesion measures 5.6 x 4.0 cm on the axial images.
Small nodules at the right lung apex. Large mass involving the right
kidney with retroperitoneal lymphadenopathy and lucent bone lesions
in lumbar spine.
IMPRESSION: CT-guided core biopsy of the large superficial soft tissue mass in
the posterior neck.

## 2020-03-08 MED ORDER — FENTANYL CITRATE (PF) 100 MCG/2ML IJ SOLN
INTRAMUSCULAR | Status: AC
  Filled 2020-03-08: qty 2

## 2020-03-08 MED ORDER — MIDAZOLAM HCL 2 MG/2ML IJ SOLN
INTRAMUSCULAR | Status: AC
  Filled 2020-03-08: qty 2

## 2020-03-08 MED ORDER — FENTANYL CITRATE (PF) 100 MCG/2ML IJ SOLN
INTRAMUSCULAR | Status: AC | PRN
  Administered 2020-03-08 (×2): 25 ug via INTRAVENOUS

## 2020-03-08 MED ORDER — MIDAZOLAM HCL 2 MG/2ML IJ SOLN
INTRAMUSCULAR | Status: AC | PRN
  Administered 2020-03-08: 1 mg via INTRAVENOUS

## 2020-03-08 NOTE — Procedures (Signed)
Interventional Radiology Procedure:   Indications: Metastatic disease and needs tissue diagnosis  Procedure: CT guided biopsy of posterior neck soft tissue mass  Findings: Soft tissue mass in posterior neck.  3 large cores obtained.   Complications: None     EBL: less than 10 ml  Plan: Bedrest 1 hour.     Delorse Shane R. Anselm Pancoast, MD  Pager: [phone]

## 2020-03-08 NOTE — Progress Notes (Signed)
OT Cancellation Note  Patient Details Name: Mariah Thornton MRN: [id number] DOB: 1945-08-04   Cancelled Treatment:    Reason Eval/Treat Not Completed: Medical issues which prohibited therapy. Chart reviewed. Pt recently back from biopsy of neck mass. Per radiology note, pt is to have "bedrest 1 hour". Will hold OT tx at this time and re-attempt at later date/time as pt is appropriate and available.  Jeni Salles, MPH, MS, OTR/L ascom [phone] 03/08/20, 3:01 PM

## 2020-03-08 NOTE — Progress Notes (Signed)
PROGRESS NOTE    ETERNITY DEXTER  AOZ:[id number] DOB: [birth year] DOA: 03/03/2020 PCP: Patient, No Pcp Per   Assessment & Plan:   Active Problems:   Hypercalcemia  Right kidney mass: w/ mets to bone & lung. 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 Will go for biopsy of L1 or L2 today  Left lung nodule: 2.5 cm. Likely mets from renal primary.   Hypercalcemia: d/c IVFs. S/p IV zolendronic acid. Likely secondary to underlying renal cell carcinoma. Resolved   Hypokalemia: WNL today. Will continue to monitor   Likely cirrhosis: w/ trace ascites & splenomegaly as per CT scan.   Normocytic anemia: H&H are trending up today. Will continue to monitor   Back pain: etiology unclear, possibly secondary to underlying malignancy. Tramadol, morphine prn for pain   Difficulty ambulating: PT/OT recs home health      DVT prophylaxis: SCDs Code Status: full  Family Communication: discussed pt's care w/ pt's husband & answered his questions again  Disposition Plan: likely home health   Status is: Inpatient  Remains inpatient appropriate because:Ongoing diagnostic testing needed not appropriate for outpatient work up; will get biopsy today of L1 or L2 by IR   Dispo: The patient is from: Home              Anticipated d/c is to: Home w/ home health               Anticipated d/c date is 1               Patient currently is not medically stable to d/c.    Consultants:  Onco   Procedures:    Antimicrobials:    Subjective: Pt c/o fatigue   Objective: Vitals:   03/07/20 1220 03/07/20 1507 03/07/20 1940 03/08/20 0512  BP: 129/60 (!) 145/60 (!) 141/60 135/64  Pulse: 82 83 85 76  Resp: 16 18 20 20   Temp: 98.4 F (36.9 C) 98.6 F (37 C) 98.1 F (36.7 C) 98.6 F (37 C)  TempSrc: Oral Oral Oral Oral  SpO2: 98% 96% 99% 98%  Weight:      Height:        Intake/Output Summary (Last 24 hours) at 03/08/2020 0731 Last data filed at 03/08/2020 0602 Gross per 24 hour  Intake 1154.39  ml  Output 850 ml  Net 304.39 ml   Filed Weights   03/03/20 1001 03/04/20 1356  Weight: 63 kg 63.2 kg    Examination: General exam:Appears calm and comfortable  Respiratory system: diminished breath sounds b/l Cardiovascular system:S1 &S2 +. No rubs, gallops or clicks.  Gastrointestinal system:Abdomen is nondistended, soft and nontender. Hypoactive bowel sounds heard. Central nervous system:Alert and awake. Moves all 4 extremities Psychiatry:Judgement and insight appear abnormal. Flat mood and affect     Data Reviewed: I have personally reviewed following labs and imaging studies  CBC: Recent Labs  Lab 03/04/20 0422 03/04/20 0422 03/05/20 9629 03/06/20 0433 03/07/20 0548 03/07/20 1857 03/08/20 0620  WBC 4.5  --  4.8 3.5* 4.3  --  4.8  HGB 8.4*   < > 8.1* 7.7* 6.9* 8.9* 9.2*  HCT 28.6*   < > 26.1* 25.3* 23.0* 29.1* 28.8*  MCV 87.5  --  83.7 84.3 85.8  --  82.5  PLT 185  --  175 167 166  --  166   < > = values in this interval not displayed.   Basic Metabolic Panel: Recent Labs  Lab 03/04/20 0422 03/05/20 5284 03/06/20 0754 03/07/20  9379 03/08/20 0620  NA 139 137 139 142 139  K 4.3 4.0 4.0 3.2* 3.8  CL 108 108 109 118* 113*  CO2 24 24 22  21* 21*  GLUCOSE 81 81 78 82 93  BUN 12 9 9  7* 8  CREATININE 0.85 0.79 0.84 0.56 0.72  CALCIUM 11.9* 9.9 9.2 7.6* 8.7*   GFR: Estimated Creatinine Clearance: 53 mL/min (by C-G formula based on SCr of 0.72 mg/dL). Liver Function Tests: Recent Labs  Lab 03/03/20 1017 03/05/20 0613 03/06/20 0754 03/07/20 0548 03/08/20 0620  AST 26 22 27 21 23   ALT 12 11 11 10 11   ALKPHOS 109 94 96 77 90  BILITOT 0.7 0.8 0.5 0.5 0.9  PROT 7.6 6.3* 6.4* 5.5* 6.4*  ALBUMIN 2.9* 2.4* 2.4* 2.0* 2.4*   No results for input(s): LIPASE, AMYLASE in the last 168 hours. No results for input(s): AMMONIA in the last 168 hours. Coagulation Profile: No results for input(s): INR, PROTIME in the last 168 hours. Cardiac Enzymes: No  results for input(s): CKTOTAL, CKMB, CKMBINDEX, TROPONINI in the last 168 hours. BNP (last 3 results) No results for input(s): PROBNP in the last 8760 hours. HbA1C: No results for input(s): HGBA1C in the last 72 hours. CBG: No results for input(s): GLUCAP in the last 168 hours. Lipid Profile: No results for input(s): CHOL, HDL, LDLCALC, TRIG, CHOLHDL, LDLDIRECT in the last 72 hours. Thyroid Function Tests: No results for input(s): TSH, T4TOTAL, FREET4, T3FREE, THYROIDAB in the last 72 hours. Anemia Panel: Recent Labs    03/06/20 0433 03/06/20 0754  VITAMINB12 2,351*  --   FOLATE  --  4.8*  FERRITIN  --  660*  TIBC  --  150*  IRON  --  16*  RETICCTPCT 2.0  --    Sepsis Labs: No results for input(s): PROCALCITON, LATICACIDVEN in the last 168 hours.  Recent Results (from the past 240 hour(s))  SARS Coronavirus 2 by RT PCR (hospital order, performed in [facility] hospital lab) Nasopharyngeal Nasopharyngeal Swab     Status: None   Collection Time: 03/03/20  5:44 PM   Specimen: Nasopharyngeal Swab  Result Value Ref Range Status   SARS Coronavirus 2 NEGATIVE NEGATIVE Final    Comment: (NOTE) SARS-CoV-2 target nucleic acids are NOT DETECTED.  The SARS-CoV-2 RNA is generally detectable in upper and lower respiratory specimens during the acute phase of infection. The lowest concentration of SARS-CoV-2 viral copies this assay can detect is 250 copies / mL. A negative result does not preclude SARS-CoV-2 infection and should not be used as the sole basis for treatment or other patient management decisions.  A negative result may occur with improper specimen collection / handling, submission of specimen other than nasopharyngeal swab, presence of viral mutation(s) within the areas targeted by this assay, and inadequate number of viral copies (<250 copies / mL). A negative result must be combined with clinical observations, patient history, and epidemiological information.  Fact  Sheet for Patients:   StrictlyIdeas.no  Fact Sheet for Healthcare Providers: BankingDealers.co.za  This test is not yet approved or  cleared by the [place] FDA and has been authorized for detection and/or diagnosis of SARS-CoV-2 by FDA under an Emergency Use Authorization (EUA).  This EUA will remain in effect (meaning this test can be used) for the duration of the COVID-19 declaration under Section 564(b)(1) of the Act, 21 U.S.C. section 360bbb-3(b)(1), unless the authorization is terminated or revoked sooner.  Performed at [facility], [address],  [place], [place] [address]          Radiology Studies: No results found.      Scheduled Meds: . docusate sodium  200 mg Oral BID  . sodium chloride flush  3 mL Intravenous Q12H   Continuous Infusions:    LOS: 5 days    Time spent: 30 mins     Wyvonnia Dusky, MD [facility] Pager [phone]  If 7PM-7AM, please contact night-coverage [email or web address] 03/08/2020, 7:31 AM

## 2020-03-08 NOTE — Progress Notes (Signed)
Patient clinically stable post posterior base of neck mass per Dr Anselm Pancoast, tolerated well. Awake/alert and oriented post procedure. Received Versed 1mg  along with 36mcg IV Fentanyl for procedure. Denies complaints post procedure. Taken back to room post recovery with report given to Konrad Penta at bedside. Husband at bedside with update given.

## 2020-03-08 NOTE — TOC Progression Note (Signed)
Transition of Care [facility]) - Progression Note    Patient Details  Name: Mariah Thornton MRN: [id number] Date of Birth: 22-Feb-1946  Transition of [facility]) CM/SW [place], LCSW Phone Number: 03/08/2020, 10:49 AM  Clinical Narrative:  New patient appointment set up with Charlott Holler, NP at [facility] in [place] on Thursday 03/31/20. Patient agreeable to PT recommendation for a rolling walker. CSW has ordered it through [employer].  Expected Discharge Plan: Home/Self Care Barriers to Discharge: Continued Medical Work up  Expected Discharge Plan and Services Expected Discharge Plan: Home/Self Care     Post Acute Care Choice: NA Living arrangements for the past 2 months: Single Family Home                                       Social Determinants of Health (SDOH) Interventions    Readmission Risk Interventions No flowsheet data found.

## 2020-03-08 NOTE — Progress Notes (Signed)
PT Cancellation Note  Patient Details Name: Mariah Thornton MRN: [id number] DOB: 1946-04-17   Cancelled Treatment:    Reason Eval/Treat Not Completed: Patient at procedure or test/unavailable, will attempt to see pt at a future date/time as medically appropriate.      Linus Salmons PT, DPT 03/08/20, 1:46 PM

## 2020-03-09 LAB — CBC
HCT: 29.3 % — ABNORMAL LOW (ref 36.0–46.0)
Hemoglobin: 8.8 g/dL — ABNORMAL LOW (ref 12.0–15.0)
MCH: 26 pg (ref 26.0–34.0)
MCHC: 30 g/dL (ref 30.0–36.0)
MCV: 86.4 fL (ref 80.0–100.0)
Platelets: 181 10*3/uL (ref 150–400)
RBC: 3.39 MIL/uL — ABNORMAL LOW (ref 3.87–5.11)
RDW: 16 % — ABNORMAL HIGH (ref 11.5–15.5)
WBC: 5 10*3/uL (ref 4.0–10.5)
nRBC: 0 % (ref 0.0–0.2)

## 2020-03-09 LAB — COMPREHENSIVE METABOLIC PANEL
ALT: 11 U/L (ref 0–44)
AST: 24 U/L (ref 15–41)
Albumin: 2.5 g/dL — ABNORMAL LOW (ref 3.5–5.0)
Alkaline Phosphatase: 88 U/L (ref 38–126)
Anion gap: 8 (ref 5–15)
BUN: 7 mg/dL — ABNORMAL LOW (ref 8–23)
CO2: 22 mmol/L (ref 22–32)
Calcium: 8.6 mg/dL — ABNORMAL LOW (ref 8.9–10.3)
Chloride: 109 mmol/L (ref 98–111)
Creatinine, Ser: 0.8 mg/dL (ref 0.44–1.00)
GFR calc Af Amer: >60 mL/min (ref >60)
GFR calc non Af Amer: >60 mL/min (ref >60)
Glucose, Bld: 85 mg/dL (ref 70–99)
Potassium: 4.1 mmol/L (ref 3.5–5.1)
Sodium: 139 mmol/L (ref 135–145)
Total Bilirubin: 0.9 mg/dL (ref 0.3–1.2)
Total Protein: 6.5 g/dL (ref 6.5–8.1)

## 2020-03-09 NOTE — Progress Notes (Signed)
Occupational Therapy Treatment Patient Details Name: Mariah Thornton MRN: [id number] DOB: June 28, 1946 Today's Date: 03/09/2020    History of present illness Pt is a 74 yo female that presented to ED from [facility] for back pain, weakness, potential confusion, as well as difficulty walking. Was recently seen for similar complaints, diagnosed with PNA and sent home on antibiotics.  MD assessment includes: Right kidney mass: w/ mets to bone & lung. Likely renal cell carcinoma, will go for biopsy of L1 or L2 on 03/08/20, Hypercalcemia, Hypokalemia, Likely cirrhosis, normocytic anemia with 1 unit PRBC 8/30, back pain, and difficulty ambulating.   OT comments  Upon entering the room, pt supine in bed with husband present in room. Pt with no c/o pain this session. Pt performing bed mobility from flat bed without use of rails at supervision level. Pt standing from bed with min cuing for hand placement with use of RW and ambulating to sink with min guard progressing to close supervision. Pt standing with min guard for grooming tasks and to wash body parts with min cuing and increased time for sequencing and problem solving. Pt returning to sit on EOB to don clothing items per pt request. Set up A for UB dressing and min A for LB dressing to thread onto L LE. Pt reviewed recommendation for intermittent supervision when pt is up ambulating at home and explained HHOT expectations further. They verbalized understanding and agreement. No further questions at this time. Pt continues to benefit from acute OT intervention.    Follow Up Recommendations  Home health OT , supervision - intermittent   Equipment Recommendations  None recommended by OT       Precautions / Restrictions Precautions Precautions: Fall       Mobility Bed Mobility Overal bed mobility: Needs Assistance Bed Mobility: Rolling;Sidelying to Sit Rolling: Supervision Sidelying to sit: Supervision       General bed mobility comments:  without use of bed rail and from flat bed  Transfers Overall transfer level: Needs assistance Equipment used: Rolling walker (2 wheeled) Transfers: Sit to/from Stand Sit to Stand: Supervision         General transfer comment: min cuing for hand placement and technique    Balance Overall balance assessment: Needs assistance Sitting-balance support: Feet supported Sitting balance-Leahy Scale: Good     Standing balance support: During functional activity;Bilateral upper extremity supported Standing balance-Leahy Scale: Fair Standing balance comment: reliance on UE support              ADL either performed or assessed with clinical judgement   ADL Overall ADL's : Needs assistance/impaired     Grooming: Supervision/safety;Cueing for safety;Brushing hair;Oral care;Wash/dry face;Wash/dry hands;Standing   Upper Body Bathing: Min guard;Standing   Lower Body Bathing: Min guard;Sit to/from stand   Upper Body Dressing : Set up;Sitting   Lower Body Dressing: Minimal assistance;Sit to/from stand                   Cognition Arousal/Alertness: Awake/alert Behavior During Therapy: WFL for tasks assessed/performed Overall Cognitive Status: Impaired/Different from baseline Area of Impairment: Safety/judgement;Awareness;Problem solving      Safety/Judgement: Decreased awareness of safety Awareness: Emergent Problem Solving: Slow processing;Requires verbal cues                     Pertinent Vitals/ Pain       Pain Assessment: Faces Faces Pain Scale: No hurt  Home Living Family/patient expects to be discharged to:: Private residence Living Arrangements:  Spouse/significant other               Frequency  Min 1X/week        Progress Toward Goals  OT Goals(current goals can now be found in the care plan section)  Progress towards OT goals: Progressing toward goals  Acute Rehab OT Goals Patient Stated Goal: to go home OT Goal Formulation: With  patient Time For Goal Achievement: 03/18/20 Potential to Achieve Goals: Good  Plan Discharge plan remains appropriate       AM-PAC OT "6 Clicks" Daily Activity     Outcome Measure     Help from another person taking care of personal grooming?: None Help from another person toileting, which includes using toliet, bedpan, or urinal?: A Little Help from another person bathing (including washing, rinsing, drying)?: A Little Help from another person to put on and taking off regular upper body clothing?: None Help from another person to put on and taking off regular lower body clothing?: A Little 6 Click Score: 17    End of Session Equipment Utilized During Treatment: Rolling walker  OT Visit Diagnosis: Other abnormalities of gait and mobility (R26.89);History of falling (Z91.81)   Activity Tolerance Patient tolerated treatment well   Patient Left in bed;with call bell/phone within reach;with bed alarm set;with family/visitor present           Time: 1000-1040 OT Time Calculation (min): 40 min  Charges: OT General Charges $OT Visit: 1 Visit OT Treatments $Self Care/Home Management : 38-52 mins  Darleen Crocker, MS, OTR/L , CBIS ascom [phone]  03/09/20, 10:50 AM

## 2020-03-09 NOTE — TOC Transition Note (Signed)
Transition of Care [facility]) - CM/SW Discharge Note   Patient Details  Name: BRONWEN PENDERGRAFT MRN: [id number] Date of Birth: 1945-10-06  [facility] CM/SW Contact:  Candie Chroman, LCSW Phone Number: 03/09/2020, 10:52 AM   Clinical Narrative: Patient has orders to discharge home today. No further concerns. CSW signing off.  Final next level of care: Home/Self Care Barriers to Discharge: Barriers Resolved   Patient Goals and CMS Choice        Discharge Placement                    Patient and family notified of of transfer: 03/09/20  Discharge Plan and Services     Post Acute Care Choice: NA                               Social Determinants of Health (SDOH) Interventions     Readmission Risk Interventions No flowsheet data found.

## 2020-03-09 NOTE — Discharge Summary (Signed)
Physician Discharge Summary   Mariah Thornton  female DOB: [birth year]  PZW:[id number]  PCP: Patient, No Pcp Per  Admit date: 03/03/2020 Discharge date: 03/09/2020  Admitted From: home Disposition:  home CODE STATUS: Full code   Hospital Course:  For full details, please see H&P, progress notes, consult notes and ancillary notes.  Briefly,  Mariah Thornton is a 74 y/o F w/ no known significant PMH who presented w/ back pain.  Right kidney mass: w/ mets to bone & lung. 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  Originally planned for biopsy of L1 or L2, however, ended up having "CT-guided core biopsy of the large superficial soft tissue mass in the posterior neck" on 8/31.  Left lung nodule: 2.5 cm.  Likely mets from renal primary.   Hypercalcemia, resolved Likely secondary to underlying renal cell carcinoma.  S/p IVF and IV zolendronic acid.   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   Normocytic anemia:  S/p 1u pRBC for Hgb 6.9 with rise to 8.9.  Back pain:  Likely due to lytic osseious lesions involving L2 and L3  Difficulty ambulating:  PT/OT recs home health    Discharge Diagnoses:  Active Problems:   Hypercalcemia    Discharge Instructions:  Allergies as of 03/09/2020   No Known Allergies     Medication List    STOP taking these medications   amoxicillin-clavulanate 875-125 MG tablet Commonly known as: Augmentin   azithromycin 250 MG tablet Commonly known as: Glenvar  (From admission, onward)         Start     Ordered   03/08/20 1027  For home use only DME Walker rolling  Once       Question Answer Comment  Walker: With [place] Wheels   Patient needs a walker to treat with the following condition Generalized weakness      03/08/20 1026           Follow-up Information    Lam, Rudi Rummage, NP. Go on 03/31/2020.   Specialty: Nurse  Practitioner Why: Address: [address], [place], [place] [address]. Appt at 2:00. Please arrive by 1:30. They will email you the intake paperwork. Please wear a mask. Contact information: [address] [address] [phone]        Oncology clinic. Schedule an appointment as soon as possible for a visit in 1 week(s).   Contact information: [phone]              No Known Allergies   The results of significant diagnostics from this hospitalization (including imaging, microbiology, ancillary and laboratory) are listed below for reference.   Consultations:   Procedures/Studies: DG Chest 2 View  Result Date: 03/03/2020 CLINICAL DATA:  Weakness, syncope EXAM: CHEST - 2 VIEW COMPARISON:  02/26/2020 FINDINGS: Right lung is grossly clear. Left lower lobe 2.5 cm suspected lung nodule. Previously noted possible left upper lobe lung nodule is not well seen today. Stable cardiomediastinal silhouette. No pneumothorax. Trace pleural effusions. IMPRESSION: 1. Trace pleural effusions. 2. Left lower lobe 2.5 cm suspected lung nodule. Chest CT is recommended for further evaluation. Electronically Signed   By: Donavan Foil M.D.   On: 03/03/2020 16:40   DG Chest 2 View  Result Date: 02/26/2020 CLINICAL DATA:  Weakness EXAM: CHEST - 2 VIEW COMPARISON:  None. FINDINGS: The  lungs are symmetrically expanded. There is focal lingular infiltrate, possibly infectious in the appropriate clinical setting. 11 mm nodule is seen within the a left upper lobe, indeterminate. No pneumothorax. Tiny right pleural effusion is present. Cardiac size within normal limits. Pulmonary vascularity is normal. No acute bone abnormality. IMPRESSION: 1. Lingular infiltrate, possibly infectious in the appropriate clinical setting. 2. Tiny right pleural effusion. 3. 11 mm nodule in the left upper lobe, indeterminate. Follow-up chest radiograph is recommended in 6 weeks, once the patient's acute issues have  resolved. If persistent, this could be further assessed with dedicated CT imaging. Electronically Signed   By: Fidela Salisbury MD   On: 02/26/2020 23:05   CT Head Wo Contrast  Result Date: 02/26/2020 CLINICAL DATA:  Weakness, altered level of consciousness EXAM: CT HEAD WITHOUT CONTRAST TECHNIQUE: Contiguous axial images were obtained from the base of the skull through the vertex without intravenous contrast. COMPARISON:  None. FINDINGS: Brain: Confluent hypodensities within the periventricular white matter are most consistent with chronic small vessel ischemic changes. No signs of acute infarct or hemorrhage. Lateral ventricles and remaining midline structures are unremarkable. No acute extra-axial fluid collections. No mass effect. Vascular: No hyperdense vessel or unexpected calcification. Skull: Multiple nonspecific soft tissue nodules are seen within the scalp, largest along the left frontal convexity measuring 2.9 cm. Etiology is indeterminate, correlation with physical exam is recommended. Underlying calvarium is unremarkable. No acute fracture. Sinuses/Orbits: No acute finding. Other: None. IMPRESSION: 1. Chronic small vessel ischemic change throughout the white matter. No acute intracranial process. 2. Multiple soft tissue nodules within the scalp, largest measuring 2.9 cm along the left frontal convexity. Etiology indeterminate. Please correlate with physical exam findings. Electronically Signed   By: Randa Ngo M.D.   On: 02/26/2020 22:38   CT CHEST WO CONTRAST  Result Date: 03/03/2020 CLINICAL DATA:  Lung nodule, hypercalcemia EXAM: CT CHEST WITHOUT CONTRAST TECHNIQUE: Multidetector CT imaging of the chest was performed following the standard protocol without IV contrast. COMPARISON:  None. FINDINGS: Cardiovascular: Cardiac size is within normal limits. No significant coronary artery calcification. No pericardial effusion. The central pulmonary arteries are enlarged in keeping with changes of  pulmonary arterial hypertension. The thoracic aorta is unremarkable. Mediastinum/Nodes: There is pathologic right paratracheal adenopathy with a a single lymph node measuring 15 mm x 20 mm at axial image # 54/2. Esophagus unremarkable. Thyroid unremarkable. Lungs/Pleura: There are multiple pulmonary nodules identified within the lungs bilaterally demonstrating a basilar predominance. The nodule noted on prior chest radiograph represents the dominant nodule measuring 24 mm x 24 mm at axial image # 101/3. There are at least 20 such nodule seen scattered throughout the lungs. Small right pleural effusion is present. The appearance and distribution are suspicious for metastatic disease. A primary pulmonary mass with intra pulmonary metastases is considered less likely. Small right pleural effusion is present. No pneumothorax. No central obstructing mass. Upper Abdomen: Cholelithiasis. There is nodular enlargement of the right adrenal gland, suspicious for metastatic involvement. There is pathologic periaortic and retrocrural adenopathy with the index lymph node measuring 13 mm in short axis diameter at axial image # 128/2 Musculoskeletal: There are 3 possible subcentimeter lytic lesions identified within the visualized portion of the L2 vertebral body involving the anterior aspect the vertebral body as well as the right pedicle. This is incompletely included on this examination and not well assessed. No other lytic or blastic bone lesion is identified. 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 Possible lytic lesion within the L2 vertebral body. The organ of origin is not clearly identified on this examination. Small right pleural effusion is nonspecific though a malignant pleural effusion must be  considered. Completion examination of the abdomen and pelvis with contrast and possible PET CT examination may be helpful for further evaluation. Cholelithiasis. Enlarged central pulmonary arteries in keeping with changes of pulmonary arterial hypertension. Electronically Signed   By: Fidela Salisbury MD   On: 03/03/2020 19:17   US SOFT TISSUE HEAD & NECK (NON-THYROID)  Result Date: 02/20/2020 CLINICAL DATA:  74 year old female with a history of neck lump EXAM: ULTRASOUND OF HEAD/NECK SOFT TISSUES TECHNIQUE: Ultrasound examination of the head and neck soft tissues was performed in the area of clinical concern. COMPARISON:  None. FINDINGS: Grayscale and color duplex performed in the region clinical concern. There is a lobulated relatively homogeneously hypoechoic soft tissue mass in the posterior low neck measuring 5.3 cm x 2.7 cm x 3.4 cm. Internal blood flow documented. The margin is well-defined, overlying the musculature. No adenopathy. IMPRESSION: Soft tissue mass in the posterior neck measures 5.3 cm. Liposarcoma or other malignancy not excluded and further evaluation with contrast-enhanced neck CT is recommended, as well as referral for surgical evaluation. Electronically Signed   By: Corrie Mckusick D.O.   On: 02/20/2020 13:29   CT ABDOMEN PELVIS W CONTRAST  Result Date: 03/04/2020 CLINICAL DATA:  Pulmonary nodules, metastatic disease suspected, primary malignancy search and staging EXAM: CT ABDOMEN AND PELVIS WITH CONTRAST TECHNIQUE: Multidetector CT imaging of the abdomen and pelvis was performed using the standard protocol following bolus administration of intravenous contrast. CONTRAST:  62mL OMNIPAQUE IOHEXOL 300 MG/ML  SOLN COMPARISON:  CT chest, 03/04/2019 FINDINGS: Lower chest: Small right, trace left pleural effusions and multiple bilateral pulmonary nodules, better evaluated by recent prior CT of the chest. Hepatobiliary: Somewhat shrunken, coarse and nodular contour of the liver. Gallstones. No  gallbladder wall thickening, or biliary dilatation. Pancreas: Unremarkable. No pancreatic ductal dilatation or surrounding inflammatory changes. Spleen: Mild splenomegaly, maximum coronal span 13.9 cm. Adrenals/Urinary Tract: Adrenal glands are unremarkable. There is a large, heterogeneous mass arising from the anterior right kidney measuring approximately 8.8 x 8.5 x 7.5 cm (series 5, image 40). Bladder is unremarkable. Stomach/Bowel: Stomach is within normal limits. Appendix appears normal. No evidence of bowel wall thickening, distention, or inflammatory changes. Vascular/Lymphatic: Aortic atherosclerosis. Numerous enlarged retroperitoneal lymph nodes measuring up to 2.6 x 1.2 cm (series 2, image 32). Reproductive: No mass or other significant abnormality. Other: No abdominal wall hernia or abnormality. Trace ascites throughout the abdomen and pelvis. Musculoskeletal: There are multiple lytic osseous lesions involving the L2 and L3 vertebral bodies with clear cortical and medullary destruction (series 2, image 34). There are multiple additional more subtle osseous lucencies throughout the spine and pelvis, which are suspicious for very subtle osseous metastatic disease. IMPRESSION: 1. There is a large, heterogeneous mass arising from the anterior right kidney measuring approximately 8.8 x 8.5 x 7.5 cm. Numerous enlarged retroperitoneal lymph nodes. 2. Multiple lytic osseous lesions involving the L2 and L3 vertebral bodies with clear cortical and medullary destruction. There are multiple additional more subtle osseous lucencies throughout the spine and pelvis, which are suspicious for very subtle osseous metastatic disease. 3. Small right, trace left pleural effusions and multiple bilateral pulmonary nodules, better evaluated by recent prior CT of  the chest. 4. 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 5. Stigmata of cirrhosis and splenomegaly. 6.  Trace ascites. 7. Cholelithiasis. 8. Aortic Atherosclerosis (ICD10-I70.0). Electronically Signed   By: Eddie Candle M.D.   On: 03/04/2020 13:36   CT BIOPSY  Result Date: 03/08/2020 INDICATION: 74 year old with presumed metastatic disease. Patient has a large right renal lesion, lung nodules and scattered bone lesions. Patient was initially scheduled for a L2 bone lesion biopsy. Patient was noted to have a large soft tissue mass in the posterior neck after she was placed prone on the CT scanner. Discussed with Dr. Mike Gip in Oncology and we decided to proceed with biopsy of this neck soft tissue mass. EXAM: CT-GUIDED CORE BIOPSY OF POSTERIOR NECK SOFT TISSUE MASS MEDICATIONS: None. ANESTHESIA/SEDATION: Moderate (conscious) sedation was employed during this procedure. A total of Versed 1.0 mg and Fentanyl 50 mcg was administered intravenously. Moderate Sedation Time: 16 minutes. The patient's level of consciousness and vital signs were monitored continuously by radiology nursing throughout the procedure under my direct supervision. FLUOROSCOPY TIME:  None COMPLICATIONS: None immediate. PROCEDURE: Informed written consent was obtained from the patient after a thorough discussion of the procedural risks, benefits and alternatives. All questions were addressed. A timeout was performed prior to the initiation of the procedure. Patient was placed prone and CT images were obtained. Initially, CT images were obtained of the abdomen. After examination of the patient and discovery of a left large soft tissue mass in the posterior neck, we got CT imaging of the neck. CT confirmed a large superficial soft tissue mass in the posterior neck. This corresponds with the soft tissue mass on the previous ultrasound from 02/19/2020. Informed consent was obtained for biopsy of the soft tissue mass from the patient. The right side of the mass and adjacent skin was prepped with chlorhexidine and sterile field was created. Skin and  soft tissues were anesthetized using 1% lidocaine. Using CT guidance, a 17 gauge coaxial needle was directed into the lesion. Multiple core biopsies were obtained with an 18 gauge core device. Specimens placed in formalin. Needle was removed without complication. Bandage placed over the puncture site. FINDINGS: Large soft tissue mass in the superficial posterior soft tissues of the neck. This lesion measures 5.6 x 4.0 cm on the axial images. Small nodules at the right lung apex. Large mass involving the right kidney with retroperitoneal lymphadenopathy and lucent bone lesions in lumbar spine. IMPRESSION: CT-guided core biopsy of the large superficial soft tissue mass in the posterior neck. Electronically Signed   By: Markus Daft M.D.   On: 03/08/2020 15:40      Labs: BNP (last 3 results) No results for input(s): BNP in the last 8760 hours. Basic Metabolic Panel: Recent Labs  Lab 03/05/20 0613 03/06/20 0754 03/07/20 0548 03/08/20 0620 03/09/20 0642  NA 137 139 142 139 139  K 4.0 4.0 3.2* 3.8 4.1  CL 108 109 118* 113* 109  CO2 24 22 21* 21* 22  GLUCOSE 81 78 82 93 85  BUN 9 9 7* 8 7*  CREATININE 0.79 0.84 0.56 0.72 0.80  CALCIUM 9.9 9.2 7.6* 8.7* 8.6*   Liver Function Tests: Recent Labs  Lab 03/05/20 0613 03/06/20 0754 03/07/20 0548 03/08/20 0620 03/09/20 0642  AST 22 27 21 23 24   ALT 11 11 10 11 11   ALKPHOS 94 96 77 90 88  BILITOT 0.8 0.5 0.5 0.9 0.9  PROT 6.3* 6.4* 5.5* 6.4* 6.5  ALBUMIN 2.4* 2.4* 2.0* 2.4* 2.5*   No results for input(s): LIPASE, AMYLASE in the last 168 hours. No results for input(s): AMMONIA in the last 168 hours. CBC: Recent Labs  Lab 03/05/20 0613 03/05/20 4081 03/06/20 0433 03/07/20 0548 03/07/20 1857 03/08/20 0620 03/09/20 0642  WBC 4.8  --  3.5* 4.3  --  4.8 5.0  HGB 8.1*   < > 7.7* 6.9* 8.9* 9.2* 8.8*  HCT 26.1*   < > 25.3* 23.0* 29.1* 28.8* 29.3*  MCV 83.7  --  84.3 85.8  --  82.5 86.4  PLT 175  --  167 166  --  166 181   < > = values  in this interval not displayed.   Cardiac Enzymes: No results for input(s): CKTOTAL, CKMB, CKMBINDEX, TROPONINI in the last 168 hours. BNP: Invalid input(s): POCBNP CBG: No results for input(s): GLUCAP in the last 168 hours. D-Dimer No results for input(s): DDIMER in the last 72 hours. Hgb A1c No results for input(s): HGBA1C in the last 72 hours. Lipid Profile No results for input(s): CHOL, HDL, LDLCALC, TRIG, CHOLHDL, LDLDIRECT in the last 72 hours. Thyroid function studies No results for input(s): TSH, T4TOTAL, T3FREE, THYROIDAB in the last 72 hours.  Invalid input(s): FREET3 Anemia work up No results for input(s): VITAMINB12, FOLATE, FERRITIN, TIBC, IRON, RETICCTPCT in the last 72 hours. Urinalysis    Component Value Date/Time   COLORURINE YELLOW (A) 03/03/2020 1005   APPEARANCEUR CLOUDY (A) 03/03/2020 1005   LABSPEC 1.024 03/03/2020 1005   PHURINE 5.0 03/03/2020 1005   GLUCOSEU NEGATIVE 03/03/2020 1005   HGBUR SMALL (A) 03/03/2020 1005   BILIRUBINUR NEGATIVE 03/03/2020 1005   KETONESUR NEGATIVE 03/03/2020 1005   PROTEINUR 100 (A) 03/03/2020 1005   NITRITE NEGATIVE 03/03/2020 1005   LEUKOCYTESUR NEGATIVE 03/03/2020 1005   Sepsis Labs Invalid input(s): PROCALCITONIN,  WBC,  LACTICIDVEN Microbiology Recent Results (from the past 240 hour(s))  SARS Coronavirus 2 by RT PCR (hospital order, performed in [facility] hospital lab) Nasopharyngeal Nasopharyngeal Swab     Status: None   Collection Time: 03/03/20  5:44 PM   Specimen: Nasopharyngeal Swab  Result Value Ref Range Status   SARS Coronavirus 2 NEGATIVE NEGATIVE Final    Comment: (NOTE) SARS-CoV-2 target nucleic acids are NOT DETECTED.  The SARS-CoV-2 RNA is generally detectable in upper and lower respiratory specimens during the acute phase of infection. The lowest concentration of SARS-CoV-2 viral copies this assay can detect is 250 copies / mL. A negative result does not preclude SARS-CoV-2 infection and should  not be used as the sole basis for treatment or other patient management decisions.  A negative result may occur with improper specimen collection / handling, submission of specimen other than nasopharyngeal swab, presence of viral mutation(s) within the areas targeted by this assay, and inadequate number of viral copies (<250 copies / mL). A negative result must be combined with clinical observations, patient history, and epidemiological information.  Fact Sheet for Patients:   StrictlyIdeas.no  Fact Sheet for Healthcare Providers: BankingDealers.co.za  This test is not yet approved or  cleared by the [place] FDA and has been authorized for detection and/or diagnosis of SARS-CoV-2 by FDA under an Emergency Use Authorization (EUA).  This EUA will remain in effect (meaning this test can be used) for the duration of the COVID-19 declaration under Section 564(b)(1) of the Act, 21 U.S.C. section 360bbb-3(b)(1), unless the authorization is terminated or revoked sooner.  Performed at [facility], [address]  [address] [place], [place] [address]      Total time spend on discharging this patient, including the last patient exam, discussing the hospital stay, instructions for ongoing care as it relates to all pertinent caregivers, as well as preparing the medical discharge records, prescriptions, and/or referrals as applicable, is 30 minutes.    Enzo Bi, MD  [facility] 03/09/2020, 9:13 AM  If 7PM-7AM, please contact night-coverage

## 2020-03-11 ENCOUNTER — Telehealth: Payer: Self-pay | Admitting: *Deleted

## 2020-03-11 NOTE — Telephone Encounter (Signed)
Patient has appointment with Dr. Mike Gip on 03/16/2020. Caregiver reports that patient's husband in very hard of hearing and would like to have caregiver accompany him in the conference room for the video visit.

## 2020-03-11 NOTE — Telephone Encounter (Signed)
  OK to accompany patient in room.  M

## 2020-03-12 ENCOUNTER — Encounter: Payer: Self-pay | Admitting: Hematology and Oncology

## 2020-03-13 LAB — PTH-RELATED PEPTIDE: PTH-related peptide: <2 pmol/L

## 2020-03-15 ENCOUNTER — Other Ambulatory Visit: Payer: Self-pay | Admitting: Pathology

## 2020-03-15 LAB — SURGICAL PATHOLOGY

## 2020-03-15 NOTE — Progress Notes (Signed)
[facility]  [address], [address] [place], [place] [address] Phone: [phone]  Fax: [phone]   Clinic Day:  03/16/2020  Referring physician: No ref. provider found  Chief Complaint: Mariah Thornton is a 74 y.o. 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  HPI:  The patient notes no medical history.  She denies any smoking history.  She presented to the emergency room on 03/03/2020 with general weakness and lower back pain.  She denied any trauma.  She had lost about 6 pounds unintentionally over the past month.  She denied any nausea, vomiting or diarrhea.  She denied any hematuria.  Labs on 03/03/2020 included a hematocrit of 28.5, hemoglobin 8.8, MCV 84.6, platelets 198,000, WBC 5900.  Creatinine was 0.96, calcium 12.3 (corrected calcium 13.235), albumin 2.9, protein 7.6 and normal liver function tests.  TSH was 3.891 with a free T4 1.03. PTH was 12 (low) and PTH-rp was < 2.0 (low).  She received IV fluids and Zometa for her hypercalcemia.  She was admitted to [facility] from 03/03/2020 - 03/09/2020 with hypercalcemia and a lung nodule.  Mentation improved with resolution of her hypercalcemia.  She received 1 unit of PRBCs for a hemoglobin of 6.9.  Labs at discharge included a hematocrit of 29.3, hemoglobin 8.8, and MCV 86.4.  Calcium was 8.6, albumin 2.5, and creatinine 0.8.  CXR on 03/03/2020 revealed a 2.5 cm left lower lobe nodule.  Chest CT on 03/03/2020 revealed multiple pulmonary nodules, thoracic and retroperitoneal adenopathy and probable malignant involvement of the right adrenal gland suspicious for metastatic disease.  There was a possible lytic lesion within the L2 vertebral body.  Abdomen and pelvis CT scan today revealed an 8.8 x 8.5 x 7.5 cm heterogeneous mass arising from the anterior right kidney.  There were numerous and large retroperitoneal lymph nodes.  There are multiple lytic  osseous lesions involving the L2 and L3 vertebral bodies with clear cortical and medullary destruction.  There are multiple additional subtle osseous lucencies throughout the spine and pelvis were suspicious for subtle metastatic disease.  There is a small right and trace left pleural effusion and multiple bilateral pulmonary nodules.  Constellation of findings was consistent with renal cell carcinoma with widespread metastatic disease.  There was stigmata of cirrhosis and splenomegaly with trace ascites.  She underwent CT guided biopsy of a 5.6 x 4.0 cm soft tissue mass in the posterior neck on 03/08/2020.  Pathology revealed metastatic carcinoma with clear cell and oncocytic features.  Cytokeratin 7 was negative, cytokeratin 20, focally positive, PAX 8 positive, GATA3 negative and p63 negative.  Based on the pattern of immunoreactivity, metastatic carcinoma is compatible with metastatic renal cell carcinoma.  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  Foundation One is pending.  Symptomatically, she is doing well. Her appetite is okay; she eats when she feels like it. She does not take calcium. The mass on her posterior neck has been present for at least 2 months. She has back pain and leg swelling.  She denies nausea, vomiting, diarrhea, blood in the stool, black stool, abdominal pain, numbness, weakness, balance and coordination problems, skin changes, and new lumps or bumps.  The patient would like to have a port-a-cath placed for treatment.   History reviewed. No pertinent past medical history.  History reviewed. No pertinent surgical history.  History reviewed. No pertinent family history.  Social History:  reports that she has never smoked. She has never used smokeless  tobacco. She reports previous alcohol use. She reports previous drug use. reports that she has never smoked. She has never used smokeless tobacco. She reports previous alcohol use. She reports previous drug use.  The patient  denies any exposure to radiation or toxins.  She states that she is retired.  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  Her husband's name is [name].  The patient lives in [place], [place]. The patient is accompanied by her friend Tye Maryland in person and her husband, [name], via iPad today.  Allergies: No Known Allergies  Current Medications: No current outpatient medications on file.   No current facility-administered medications for this visit.    Review of Systems  Constitutional: Negative for chills, diaphoresis, fever, malaise/fatigue and weight loss (unintentional- 6 pounds in past month).  HENT: Negative for congestion, ear discharge, ear pain, hearing loss, nosebleeds, sinus pain, sore throat and tinnitus.   Eyes: Negative for blurred vision and double vision.  Respiratory: Negative for cough, hemoptysis, sputum production and shortness of breath.   Cardiovascular: Positive for leg swelling. Negative for chest pain and palpitations.  Gastrointestinal: Negative for abdominal pain, blood in stool, constipation, diarrhea, heartburn, melena, nausea and vomiting.       Appetite "so-so".  Genitourinary: Negative for dysuria, frequency, hematuria and urgency.  Musculoskeletal: Positive for back pain (for years). Negative for joint pain, myalgias and neck pain.       Mass on back of neck for at least 2 months.  Skin: Negative for itching and rash.  Neurological: Negative for dizziness, tingling, sensory change, weakness and headaches.  Endo/Heme/Allergies: Does not bruise/bleed easily.  Psychiatric/Behavioral: Negative for depression and memory loss. The patient is not nervous/anxious and does not have insomnia.   All other systems reviewed and are negative.  Performance status (ECOG): 1  Vitals Blood pressure (!) 141/87, pulse 87, temperature 98.3 F (36.8 C), temperature source Tympanic, resp. rate 18, height 5' 2"  (1.575 m), weight 142 lb 3.2 oz (64.5 kg), SpO2 97 %.    Physical Exam Vitals and nursing note reviewed.  Constitutional:      Comments: Rolling walker by her side.  HENT:     Head: Normocephalic and atraumatic.     Mouth/Throat:     Mouth: Mucous membranes are moist.     Pharynx: Oropharynx is clear.  Eyes:     General: No scleral icterus.    Extraocular Movements: Extraocular movements intact.     Conjunctiva/sclera: Conjunctivae normal.     Pupils: Pupils are equal, round, and reactive to light.  Neck:     Comments: 8 x 7 cm mass on posterior neck (see photo) Cardiovascular:     Rate and Rhythm: Normal rate and regular rhythm.     Heart sounds: Normal heart sounds. No murmur heard.   Pulmonary:     Effort: Pulmonary effort is normal. No respiratory distress.     Breath sounds: Normal breath sounds. No wheezing or rales.  Chest:     Chest wall: No tenderness.  Abdominal:     General: Bowel sounds are normal. There is no distension.     Palpations: Abdomen is soft. There is mass (palpable fullness 4 fingerbreadths below right costal margin).     Tenderness: There is no abdominal tenderness. There is no guarding or rebound.  Musculoskeletal:        General: No swelling or tenderness. Normal range of motion.     Cervical back: Normal range of motion and neck supple.  Lymphadenopathy:     Head:     Right side of head: No preauricular, posterior auricular or occipital adenopathy.     Left side of head: No preauricular, posterior auricular or occipital adenopathy.     Cervical: No cervical adenopathy.     Upper Body:     Right upper body: No supraclavicular adenopathy.     Left upper body: No supraclavicular adenopathy.     Lower Body: No right inguinal adenopathy. No left inguinal adenopathy.  Skin:    General: Skin is warm and dry.  Neurological:     Mental Status: She is alert and oriented to person, place, and time.     Comments: Remembered 3/3 words immediately, 1/3 in 10 min, 1/3 with prompting.  Psychiatric:         Thought Content: Thought content normal.        Judgment: Judgment normal.    03/16/2020    No visits with results within 3 Day(s) from this visit.  Latest known visit with results is:  No results displayed because visit has over 200 results.      Assessment:  Mariah Thornton is a 74 y.o. female with metastatic renal cell carcinoma s/p CT guided biopsy of a 5.6 x 4.0 cm soft tissue mass in the posterior neck on 03/08/2020.  Pathology revealed metastatic carcinoma with clear cell and oncocytic features.  Cytokeratin 7 was negative, cytokeratin 20, focally positive, PAX 8 positive, GATA3 negative and p63 negative.  Based on the pattern of immunoreactivity, metastatic carcinoma is compatible with metastatic renal cell carcinoma.  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 She presented with hypercalcemia.   LDH was 245 on 03/05/2020.  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  There was a possible lytic lesion within the L2 vertebral body.  Abdomen and pelvis CT on 03/04/2020 revealed an 8.8 x 8.5 x 7.5 cm heterogeneous mass arising from the anterior right kidney.  There were numerous and large retroperitoneal lymph nodes.  There are multiple lytic osseous lesions involving the L2 and L3 vertebral bodies with clear cortical and medullary destruction.  There are multiple additional subtle osseous lucencies throughout the spine and pelvis were suspicious for subtle metastatic disease.  There is a small right and trace left pleural effusion and multiple bilateral pulmonary nodules.  There was stigmata of cirrhosis and splenomegaly with trace ascites.  She has a history of hypercalcemia.   She received Zometa on 03/04/2020.  She has folate deficiency.  Folate was 4.8 on 03/06/2020.  She is on oral folic acid.  B12 was 2351 on 03/06/2020.  She  received thePfizer COVID-19 vaccine on 11/11/2019 and 12/08/2019.  Symptomatically, she has lost 6 pounds in the past month.  She has a 2 month history of an enlarging mass on her posterior neck.  Mentation is back to baseline after correction of her hypercalcemia.   Plan: 1.Right kidney mass Review imaging studies with family.  She has a large right renal mass, multiple lytic bone lesions, and a mass on her posterior neck.  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  She is in a poor prognostic group based on [facility] criteria (metastatic disease, KPS < 80%, hypercalcemia, elevated LDH, and anemia). Review plans for axitinib (Inlyta) and pembrolizumab based on the KEYNOTE-426 trial.   There were approximately 860 patient treated with a median follow-up of 12.8 months.  As compared to sunitinib, the 12 month overall survival rate was 90% vs 78%.   As compared to sunitinib, the 24 month overall survival was 74.4% vs  65.5%.   As compared to sunitinib, overall response rate 59% vs 36%.   Review potential side effects in detail including hypertension, thrombosis, hemorrhage, GI perforation/fistula.   Discuss potentitl side effects of PDL1-inhibitors including immune mediated reactions.   Information provided of axitinib and pembrolizumab.  Preauth axitinib + pembrolizumab.  Discuss baseline bone scan.  Discuss port-a-cath placement.  Patient interested.  Present at tumor board on 03/24/2020. 2. Hypercalcemia Patient has hypercalcemia associated with malignancy. Discuss plans to continue monthly Zometa (next due 04/01/2020).  Preauth Zometa. 3. Normocytic anemia Etiology likely secondary to renal cell carcinoma. Patient denies any bleeding. Ferritin 660 with an iron saturation of 11% and a TIBC of 150.             Folate 4.8 (low) with a B12 of 2351.  Patient on oral folic acid. 4.   Folate  deficiency             Begin folic acid 1 mg po q day. 5.   Port placement. 6.   Baseline echo. 7.   Bone scan. 8.   Schedule chemotherapy class. 9.   RTC on 03/24/2020 in the afternoon for MD assessment, labs (CBC with diff, BMP), and review of treatment plan.  I discussed the assessment and treatment plan with the patient.  The patient was provided an opportunity to ask questions and all were answered.  The patient agreed with the plan and demonstrated an understanding of the instructions.  The patient was advised to call back if the symptoms worsen or if the condition fails to improve as anticipated.  I provided 41 minutes of face-to-face time during this this encounter and > 50% was spent counseling as documented under my assessment and plan. An additional 10 minutes were spent reviewing her chart (Epic and Care Everywhere) including notes, labs, imaging studies, writing chemotherapy, and discussing with pharmacy.    Melissa C. Mike Gip, MD, PhD    03/16/2020, 4:05 PM  I, Mirian Mo Tufford, am acting as Education administrator for [employer]. Mike Gip, MD, PhD.  I, Melissa C. Mike Gip, MD, have reviewed the above documentation for accuracy and completeness, and I agree with the above.

## 2020-03-16 ENCOUNTER — Telehealth: Payer: Self-pay

## 2020-03-16 ENCOUNTER — Other Ambulatory Visit: Payer: Self-pay | Admitting: Hematology and Oncology

## 2020-03-16 ENCOUNTER — Other Ambulatory Visit: Payer: Self-pay

## 2020-03-16 ENCOUNTER — Encounter: Payer: Self-pay | Admitting: Hematology and Oncology

## 2020-03-16 ENCOUNTER — Inpatient Hospital Stay: Payer: Medicare Other | Attending: Hematology and Oncology | Admitting: Hematology and Oncology

## 2020-03-16 VITALS — BP 141/87 | HR 87 | Temp 98.3°F | Resp 18 | Ht 62.0 in | Wt 142.2 lb

## 2020-03-16 DIAGNOSIS — D649 Anemia, unspecified: Secondary | ICD-10-CM

## 2020-03-16 DIAGNOSIS — R590 Localized enlarged lymph nodes: Secondary | ICD-10-CM | POA: Insufficient documentation

## 2020-03-16 DIAGNOSIS — C649 Malignant neoplasm of unspecified kidney, except renal pelvis: Secondary | ICD-10-CM | POA: Insufficient documentation

## 2020-03-16 DIAGNOSIS — Z7189 Other specified counseling: Secondary | ICD-10-CM

## 2020-03-16 DIAGNOSIS — R918 Other nonspecific abnormal finding of lung field: Secondary | ICD-10-CM | POA: Insufficient documentation

## 2020-03-16 DIAGNOSIS — Z5111 Encounter for antineoplastic chemotherapy: Secondary | ICD-10-CM | POA: Insufficient documentation

## 2020-03-16 DIAGNOSIS — C641 Malignant neoplasm of right kidney, except renal pelvis: Secondary | ICD-10-CM | POA: Diagnosis not present

## 2020-03-16 DIAGNOSIS — Z79899 Other long term (current) drug therapy: Secondary | ICD-10-CM | POA: Insufficient documentation

## 2020-03-16 DIAGNOSIS — E538 Deficiency of other specified B group vitamins: Secondary | ICD-10-CM

## 2020-03-16 NOTE — Telephone Encounter (Signed)
Faxed port placement request to  vein and vascular/Dr Dew's office.

## 2020-03-17 ENCOUNTER — Telehealth: Payer: Self-pay | Admitting: Pharmacy Technician

## 2020-03-17 ENCOUNTER — Telehealth (INDEPENDENT_AMBULATORY_CARE_PROVIDER_SITE_OTHER): Payer: Self-pay

## 2020-03-17 ENCOUNTER — Telehealth: Payer: Self-pay | Admitting: Pharmacist

## 2020-03-17 NOTE — Telephone Encounter (Signed)
Oral Oncology Pharmacy Student Encounter  Received new prescription for Inlyta (axitinib) for the treatment of metastatic RCC in conjunction with Keytruda (pembrolizumab), planned duration until disease progression or unacceptable drug toxicity.  CMP from 03/09/2020 assessed, Ca 8.6 likely due to hypercalcemia upon presentation to hospital and was treated with Zometa. CMP otherwise unremarkable.  CBC from 03/09/2020 assessed, Hb 8.8 (appears to be baseline for her).  Baseline blood pressure 141/87 on 03/16/2020 currently not on a antihypertensive. Monitor patient for future need of antihypertensive for BP management. Will recommend home BP monitoring to patient.  Current medication list in Epic reviewed, no medications listed in profile. Will verify with patient that she is not taking other medications.   Evaluated chart and no patient barriers to medication adherence identified.   Bethena Roys, patient advocate, conducted a benefits investigation and did not find prescription coverage.   Oral Oncology Clinic will continue to follow for initial counseling and start date.  Lowella Fairy D Candidate 2022  [facility] Oral Chemotherapy Navigation Clinic [phone]  03/17/2020 9:46 AM

## 2020-03-17 NOTE — Telephone Encounter (Signed)
I attempted to contact the patient through her home and mobile number. Home number states to call again and a message was left on the mobile to return my call.

## 2020-03-17 NOTE — Telephone Encounter (Addendum)
Oral Oncology Patient Advocate Encounter  Spoke with patients caregiver, Mariah Thornton, about applying for patient assistance for Inlyta.  Kim checked with Mariah Thornton, the patients husband, to verify that there wasn't a prescription plan.  I explained to Mariah Thornton that Medicare A/B and the supplement do not cover prescription drugs and she voiced understanding.    I have emailed her the application to review and have the patient sign.  She will return it to me once completed.  I will continue to update this encounter with any information.  [name] [facility] [place] Phone [phone] Fax [phone] 03/21/2020 2:21 PM

## 2020-03-18 ENCOUNTER — Encounter: Payer: Self-pay | Admitting: Hematology and Oncology

## 2020-03-21 NOTE — Telephone Encounter (Signed)
Spoke with Lattie Haw regarding the patient and getting scheduled for port placement on 03/28/20 with a 9:45 arrival time to the [facility]. Covid testing on 03/24/20 between 8-1 pm at the [facility]. Pre-procedure instructions were discussed and will be mailed.

## 2020-03-21 NOTE — Telephone Encounter (Signed)
Attempted to contact the patient and a message was left for a return call.

## 2020-03-22 DIAGNOSIS — Z7189 Other specified counseling: Secondary | ICD-10-CM | POA: Insufficient documentation

## 2020-03-22 MED ORDER — AXITINIB 5 MG PO TABS: 5.0000 mg | ORAL_TABLET | Freq: Two times a day (BID) | ORAL | 0 refills | Status: DC

## 2020-03-22 NOTE — H&P (View-Only) (Signed)
START ON PATHWAY REGIMEN - Renal Cell     A cycle is every 21 days:     Axitinib      Pembrolizumab   **Always confirm dose/schedule in your pharmacy ordering system**  Patient Characteristics: Stage IV/Metastatic Disease, Clear Cell, First Line, Intermediate or Poor Risk Therapeutic Status: Stage IV/Metastatic Disease Histology: Clear Cell Line of Therapy: First Line Risk Status: Poor Risk Intent of Therapy: Non-Curative / Palliative Intent, Discussed with Patient 

## 2020-03-22 NOTE — Progress Notes (Signed)
START ON PATHWAY REGIMEN - Renal Cell     A cycle is every 21 days:     Axitinib      Pembrolizumab   **Always confirm dose/schedule in your pharmacy ordering system**  Patient Characteristics: Stage IV/Metastatic Disease, Clear Cell, First Line, Intermediate or Poor Risk Therapeutic Status: Stage IV/Metastatic Disease Histology: Clear Cell Line of Therapy: First Line Risk Status: Poor Risk Intent of Therapy: Non-Curative / Palliative Intent, Discussed with Patient 

## 2020-03-24 ENCOUNTER — Other Ambulatory Visit: Payer: Medicare Other

## 2020-03-24 ENCOUNTER — Other Ambulatory Visit
Admission: RE | Admit: 2020-03-24 | Discharge: 2020-03-24 | Disposition: A | Discharge status: Home / Self Care | Payer: Medicare Other | Source: Ambulatory Visit | Attending: Vascular Surgery | Admitting: Vascular Surgery

## 2020-03-24 ENCOUNTER — Other Ambulatory Visit: Payer: Self-pay

## 2020-03-24 DIAGNOSIS — Z01812 Encounter for preprocedural laboratory examination: Secondary | ICD-10-CM | POA: Diagnosis present

## 2020-03-24 DIAGNOSIS — Z20822 Contact with and (suspected) exposure to covid-19: Secondary | ICD-10-CM | POA: Insufficient documentation

## 2020-03-24 LAB — SARS CORONAVIRUS 2 (TAT 6-24 HRS): SARS Coronavirus 2: NEGATIVE

## 2020-03-24 NOTE — Progress Notes (Signed)
Tumor Board Documentation  Mariah Thornton was presented by Dr Mike Gip at our Tumor Board on 03/24/2020, which included representatives from medical oncology, radiation oncology, surgical oncology, internal medicine, navigation, pathology, radiology, surgical, pharmacy, genetics, research, pulmonology.  Mariah Thornton currently presents as a new patient, for [place], for new positive pathology with history of the following treatments: active survellience, surgical intervention(s).  Additionally, we reviewed previous medical and familial history, history of present illness, and recent lab results along with all available histopathologic and imaging studies. The tumor board considered available treatment options and made the following recommendations: Immunotherapy, Surgery (Will discuss with Urology)    The following procedures/referrals were also placed: No orders of the defined types were placed in this encounter.   Clinical Trial Status: not discussed   Staging used: AJCC Stage Group  AJCC Staging: T: 2 N: 1 M: 1 Group: Renal CellCarcinoma Clear Cell type   National site-specific guidelines NCCN were discussed with respect to the case.  Tumor board is a meeting of clinicians from various specialty areas who evaluate and discuss patients for whom a multidisciplinary approach is being considered. Final determinations in the plan of care are those of the provider(s). The responsibility for follow up of recommendations given during tumor board is that of the provider.   Today's extended care, comprehensive team conference, Mariah Thornton was not present for the discussion and was not examined.   Multidisciplinary Tumor Board is a multidisciplinary case peer review process.  Decisions discussed in the Multidisciplinary Tumor Board reflect the opinions of the specialists present at the conference without having examined the patient.  Ultimately, treatment and diagnostic decisions rest with the primary provider(s)  and the patient.

## 2020-03-27 ENCOUNTER — Other Ambulatory Visit (INDEPENDENT_AMBULATORY_CARE_PROVIDER_SITE_OTHER): Payer: Self-pay | Admitting: Nurse Practitioner

## 2020-03-28 ENCOUNTER — Ambulatory Visit
Admission: RE | Admit: 2020-03-28 | Discharge: 2020-03-28 | Disposition: A | Discharge status: Home / Self Care | Payer: Medicare Other | Attending: Vascular Surgery | Admitting: Vascular Surgery

## 2020-03-28 ENCOUNTER — Encounter: Payer: Self-pay | Admitting: Vascular Surgery

## 2020-03-28 ENCOUNTER — Encounter
Admission: RE | Disposition: A | Discharge status: Home / Self Care | Payer: Self-pay | Source: Home / Self Care | Attending: Vascular Surgery

## 2020-03-28 ENCOUNTER — Other Ambulatory Visit: Payer: Self-pay

## 2020-03-28 DIAGNOSIS — C641 Malignant neoplasm of right kidney, except renal pelvis: Secondary | ICD-10-CM | POA: Insufficient documentation

## 2020-03-28 HISTORY — PX: PORTA CATH INSERTION: CATH118285

## 2020-03-28 SURGERY — PORTA CATH INSERTION
Anesthesia: Moderate Sedation

## 2020-03-28 MED ORDER — MIDAZOLAM HCL 2 MG/ML PO SYRP: 8.0000 mg | ORAL_SOLUTION | Freq: Once | ORAL | Status: DC | PRN

## 2020-03-28 MED ORDER — CEFAZOLIN SODIUM-DEXTROSE 2-4 GM/100ML-% IV SOLN
2.0000 g | Freq: Once | INTRAVENOUS | Status: AC
  Administered 2020-03-28: 2 g via INTRAVENOUS

## 2020-03-28 MED ORDER — ONDANSETRON HCL 4 MG/2ML IJ SOLN: 4.0000 mg | Freq: Four times a day (QID) | INTRAMUSCULAR | Status: DC | PRN

## 2020-03-28 MED ORDER — HYDROMORPHONE HCL 1 MG/ML IJ SOLN: 1.0000 mg | Freq: Once | INTRAMUSCULAR | Status: DC | PRN

## 2020-03-28 MED ORDER — MIDAZOLAM HCL 2 MG/2ML IJ SOLN
INTRAMUSCULAR | Status: DC | PRN
  Administered 2020-03-28: 2 mg via INTRAVENOUS

## 2020-03-28 MED ORDER — FENTANYL CITRATE (PF) 100 MCG/2ML IJ SOLN
INTRAMUSCULAR | Status: DC | PRN
Start: 2020-03-28 — End: 2020-03-28
  Administered 2020-03-28: 50 ug via INTRAVENOUS

## 2020-03-28 MED ORDER — MIDAZOLAM HCL 2 MG/2ML IJ SOLN
INTRAMUSCULAR | Status: AC
  Filled 2020-03-28: qty 2

## 2020-03-28 MED ORDER — SODIUM CHLORIDE 0.9 % IV SOLN: INTRAVENOUS | Status: DC

## 2020-03-28 MED ORDER — DIPHENHYDRAMINE HCL 50 MG/ML IJ SOLN: 50.0000 mg | Freq: Once | INTRAMUSCULAR | Status: DC | PRN

## 2020-03-28 MED ORDER — METHYLPREDNISOLONE SODIUM SUCC 125 MG IJ SOLR: 125.0000 mg | Freq: Once | INTRAMUSCULAR | Status: DC | PRN

## 2020-03-28 MED ORDER — FENTANYL CITRATE (PF) 100 MCG/2ML IJ SOLN
INTRAMUSCULAR | Status: AC
  Filled 2020-03-28: qty 2

## 2020-03-28 MED ORDER — SODIUM CHLORIDE 0.9 % IV SOLN
Freq: Once | INTRAVENOUS | Status: DC
  Filled 2020-03-28: qty 2

## 2020-03-28 MED ORDER — FAMOTIDINE 20 MG PO TABS: 40.0000 mg | ORAL_TABLET | Freq: Once | ORAL | Status: DC | PRN

## 2020-03-28 SURGICAL SUPPLY — 12 items
ADH SKN CLS APL DERMABOND .7 (GAUZE/BANDAGES/DRESSINGS) ×1
COVER PROBE U/S 5X48 (MISCELLANEOUS) ×2 IMPLANT
DERMABOND ADVANCED (GAUZE/BANDAGES/DRESSINGS) ×2
DERMABOND ADVANCED .7 DNX12 (GAUZE/BANDAGES/DRESSINGS) IMPLANT
KIT PORT POWER 8FR ISP CVUE (Port) ×2 IMPLANT
PACK ANGIOGRAPHY (CUSTOM PROCEDURE TRAY) ×3 IMPLANT
PENCIL ELECTRO HAND CTR (MISCELLANEOUS) ×2 IMPLANT
SPONGE XRAY 4X4 16PLY STRL (MISCELLANEOUS) ×2 IMPLANT
SUT MNCRL AB 4-0 PS2 18 (SUTURE) ×2 IMPLANT
SUT PROLENE 0 CT 1 30 (SUTURE) ×2 IMPLANT
SUT VIC AB 3-0 SH 27 (SUTURE) ×3
SUT VIC AB 3-0 SH 27X BRD (SUTURE) IMPLANT

## 2020-03-28 NOTE — Discharge Instructions (Signed)
Moderate Conscious Sedation, Adult, Care After These instructions provide you with information about caring for yourself after your procedure. Your health care provider may also give you more specific instructions. Your treatment has been planned according to current medical practices, but problems sometimes occur. Call your health care provider if you have any problems or questions after your procedure. What can I expect after the procedure? After your procedure, it is common:  To feel sleepy for several hours.  To feel clumsy and have poor balance for several hours.  To have poor judgment for several hours.  To vomit if you eat too soon. Follow these instructions at home: For at least 24 hours after the procedure:   Do not: ? Participate in activities where you could fall or become injured. ? Drive. ? Use heavy machinery. ? Drink alcohol. ? Take sleeping pills or medicines that cause drowsiness. ? Make important decisions or sign legal documents. ? Take care of children on your own.  Rest. Eating and drinking  Follow the diet recommended by your health care provider.  If you vomit: ? Drink water, juice, or soup when you can drink without vomiting. ? Make sure you have little or no nausea before eating solid foods. General instructions  Have a responsible adult stay with you until you are awake and alert.  Take over-the-counter and prescription medicines only as told by your health care provider.  If you smoke, do not smoke without supervision.  Keep all follow-up visits as told by your health care provider. This is important. Contact a health care provider if:  You keep feeling nauseous or you keep vomiting.  You feel light-headed.  You develop a rash.  You have a fever. Get help right away if:  You have trouble breathing. This information is not intended to replace advice given to you by your health care provider. Make sure you discuss any questions you have  with your health care provider. Document Revised: 06/07/2017 Document Reviewed: 10/15/2015 Elsevier Patient Education  2020 Elsevier Inc. Implanted Port Insertion, Care After This sheet gives you information about how to care for yourself after your procedure. Your health care provider may also give you more specific instructions. If you have problems or questions, contact your health care provider. What can I expect after the procedure? After the procedure, it is common to have:  Discomfort at the port insertion site.  Bruising on the skin over the port. This should improve over 3-4 days. Follow these instructions at home: Port care  After your port is placed, you will get a manufacturer's information card. The card has information about your port. Keep this card with you at all times.  Take care of the port as told by your health care provider. Ask your health care provider if you or a family member can get training for taking care of the port at home. A home health care nurse may also take care of the port.  Make sure to remember what type of port you have. Incision care      Follow instructions from your health care provider about how to take care of your port insertion site. Make sure you: ? Wash your hands with soap and water before and after you change your bandage (dressing). If soap and water are not available, use hand sanitizer. ? Change your dressing as told by your health care provider. ? Leave stitches (sutures), skin glue, or adhesive strips in place. These skin closures may need to stay   in place for 2 weeks or longer. If adhesive strip edges start to loosen and curl up, you may trim the loose edges. Do not remove adhesive strips completely unless your health care provider tells you to do that.  Check your port insertion site every day for signs of infection. Check for: ? Redness, swelling, or pain. ? Fluid or blood. ? Warmth. ? Pus or a bad smell. Activity  Return  to your normal activities as told by your health care provider. Ask your health care provider what activities are safe for you.  Do not lift anything that is heavier than 10 lb (4.5 kg), or the limit that you are told, until your health care provider says that it is safe. General instructions  Take over-the-counter and prescription medicines only as told by your health care provider.  Do not take baths, swim, or use a hot tub until your health care provider approves. Ask your health care provider if you may take showers. You may only be allowed to take sponge baths.  Do not drive for 24 hours if you were given a sedative during your procedure.  Wear a medical alert bracelet in case of an emergency. This will tell any health care providers that you have a port.  Keep all follow-up visits as told by your health care provider. This is important. Contact a health care provider if:  You cannot flush your port with saline as directed, or you cannot draw blood from the port.  You have a fever or chills.  You have redness, swelling, or pain around your port insertion site.  You have fluid or blood coming from your port insertion site.  Your port insertion site feels warm to the touch.  You have pus or a bad smell coming from the port insertion site. Get help right away if:  You have chest pain or shortness of breath.  You have bleeding from your port that you cannot control. Summary  Take care of the port as told by your health care provider. Keep the manufacturer's information card with you at all times.  Change your dressing as told by your health care provider.  Contact a health care provider if you have a fever or chills or if you have redness, swelling, or pain around your port insertion site.  Keep all follow-up visits as told by your health care provider. This information is not intended to replace advice given to you by your health care provider. Make sure you discuss any  questions you have with your health care provider. Document Revised: 01/21/2018 Document Reviewed: 01/21/2018 Elsevier Patient Education  2020 Elsevier Inc.  

## 2020-03-28 NOTE — Interval H&P Note (Signed)
History and Physical Interval Note:  03/28/2020 10:10 AM  Mariah Thornton  has presented today for surgery, with the diagnosis of Porta Cath Placement   Renal Cell Ca RT Covid  Sept 16.  The various methods of treatment have been discussed with the patient and family. After consideration of risks, benefits and other options for treatment, the patient has consented to  Procedure(s): PORTA CATH INSERTION (N/A) as a surgical intervention.  The patient's history has been reviewed, patient examined, no change in status, stable for surgery.  I have reviewed the patient's chart and labs.  Questions were answered to the patient's satisfaction.     Leotis Pain

## 2020-03-28 NOTE — Op Note (Signed)
      [facility]       Operative Note  Date: 03/28/2020  Preoperative diagnosis:  1. Renal cell cancer  Postoperative diagnosis:  Same as above  Procedures: #1. Ultrasound guidance for vascular access to the right internal jugular vein. #2. Fluoroscopic guidance for placement of catheter. #3. Placement of CT compatible Port-A-Cath, right internal jugular vein.  Surgeon: Leotis Pain, MD.   Anesthesia: Local with moderate conscious sedation for approximately 20  minutes using 2 mg of Versed and 50 mcg of Fentanyl  Fluoroscopy time: less than 1 minute  Contrast used: 0  Estimated blood loss: 3 cc  Indication for the procedure:  The patient is a 74 y.o.female with renal cell cancer.  The patient needs a Port-A-Cath for durable venous access, chemotherapy, lab draws, and CT scans. We are asked to place this. Risks and benefits were discussed and informed consent was obtained.  Description of procedure: The patient was brought to the vascular and interventional radiology suite.  Moderate conscious sedation was administered throughout the procedure during a face to face encounter with the patient with my supervision of the RN administering medicines and monitoring the patient's vital signs, pulse oximetry, telemetry and mental status throughout from the start of the procedure until the patient was taken to the recovery room. The right neck chest and shoulder were sterilely prepped and draped, and a sterile surgical field was created. Ultrasound was used to help visualize a patent right internal jugular vein. This was then accessed under direct ultrasound guidance without difficulty with the Seldinger needle and a permanent image was recorded. A J-wire was placed. After skin nick and dilatation, the peel-away sheath was then placed over the wire. I then anesthetized an area under the clavicle approximately 1-2 fingerbreadths. A transverse incision was created and an inferior  pocket was created with electrocautery and blunt dissection. The port was then brought onto the field, placed into the pocket and secured to the chest wall with 2 Prolene sutures. The catheter was connected to the port and tunneled from the subclavicular incision to the access site. Fluoroscopic guidance was then used to cut the catheter to an appropriate length. The catheter was then placed through the peel-away sheath and the peel-away sheath was removed. The catheter tip was parked in excellent location under fluorocoscopic guidance in the cavoatrial junction. The pocket was then irrigated with antibiotic impregnated saline and the wound was closed with a running 3-0 Vicryl and a 4-0 Monocryl. The access incision was closed with a single 4-0 Monocryl. The Huber needle was used to withdraw blood and flush the port with heparinized saline. Dermabond was then placed as a dressing. The patient tolerated the procedure well and was taken to the recovery room in stable condition.   Leotis Pain 03/28/2020 11:14 AM   This note was created with Dragon Medical transcription system. Any errors in dictation are purely unintentional.

## 2020-03-28 NOTE — Telephone Encounter (Signed)
Oral Oncology Patient Advocate Encounter  Coordinated with caregiver, Maudie Mercury, to complete application for [employer] Patient Assistance in an effort to reduce patient's out of pocket expense for Inlyta to $0.    Application completed and faxed to [phone] on 03/24/20.   [employer] patient assistance phone number for follow up is [phone].   This encounter will be updated until final determination.   [place] Patient [place] Phone [phone] Fax [phone] 03/28/2020 3:21 PM

## 2020-03-30 NOTE — Telephone Encounter (Signed)
Oral Oncology Patient Advocate Encounter  Received notification from [place] Patient Assistance program that patient has been successfully enrolled into their program to receive Inlyta from the manufacturer at $0 out of pocket until 07/08/2020.    I called and spoke with Maudie Mercury.   She knows we will have to re-apply.   Specialty Pharmacy that will dispense medication is [facility].  Patient knows to call the office with questions or concerns.   Oral Oncology Clinic will continue to follow.  [place] Patient [place] Phone [phone] Fax [phone] 03/30/2020 4:22 PM

## 2020-03-31 ENCOUNTER — Encounter: Payer: Self-pay | Admitting: Hematology and Oncology

## 2020-04-01 ENCOUNTER — Other Ambulatory Visit: Payer: Self-pay | Admitting: Hematology and Oncology

## 2020-04-01 ENCOUNTER — Telehealth: Payer: Self-pay | Admitting: *Deleted

## 2020-04-01 ENCOUNTER — Telehealth: Payer: Self-pay | Admitting: Hematology and Oncology

## 2020-04-01 DIAGNOSIS — C641 Malignant neoplasm of right kidney, except renal pelvis: Secondary | ICD-10-CM

## 2020-04-01 MED ORDER — LIDOCAINE-PRILOCAINE 2.5-2.5 % EX CREA: TOPICAL_CREAM | CUTANEOUS | 3 refills | Status: AC

## 2020-04-01 MED ORDER — ONDANSETRON HCL 8 MG PO TABS
8.0000 mg | ORAL_TABLET | Freq: Two times a day (BID) | ORAL | 1 refills | Status: AC | PRN
Start: 2020-04-01 — End: ?

## 2020-04-01 NOTE — Telephone Encounter (Signed)
04/01/2020 Per Dr. Mike Gip, pt was scheduled for labs/MD/Zometa on 04/04/20 @ 2 pm. NM Bone Scan was scheduled for 04/05/20 @ 8:30 and 11:30 with chemo education class for new pembrolizumab scheduled in between @ 9. Echocardiogram scheduled at [facility] on 04/06/20 @ 3 pm. MD follow up to review scans and first infusion scheduled for 04/07/20 @ 8:30. Spoke with pt's friend [name] regarding appt info and confirmed dates. She will relay the information to the pt and ensure she arrives  SRW

## 2020-04-01 NOTE — Telephone Encounter (Signed)
Levada Dy called asking about patient port site sutures and when they are to be removed. Per AVS,they are to stay in for 2 weeks and I advised her that wec ould remove them when she comes in for her treatment

## 2020-04-01 NOTE — Telephone Encounter (Signed)
[name] who is on the releaase of information called asking if you would please respond to her and her sister's messages sent 9/10 and 9/23. She is confused because a letter was sent to pt stating that she has an appt at [facility] for Breast cancer and she cannot find out who or why pt was referred there and for breast cancer when she has Renal Cell Carcinoma. Would you please call her  [phone]

## 2020-04-01 NOTE — Telephone Encounter (Signed)
Re:  Treatment plan  Today I spoke with [name] [phone]) who is the daughter of Mariah Thornton ([phone]).  The patient has signed both Mariah Thornton and Gerre Couch as designated party releases for her medical information (HIPAA compliant).  We reviewed the information provided at her last clinic appointment.  Per Ms. Micheal Likens, this conversation was recorded.  We discussed her treatment plan with axitinib and pembrolizumab.  Potential side effects were reviewed.  Data from the [facility] trial were reviewed.  We discussed attending chemotherapy class, a baseline echo and bone scan.  Elesa Massed, pharmacist, will be contacted today regarding the status of [place] assistance for axitinib.  Elesa Massed notified me after today's call that the patient had been approved.  Delivery will be tentatively 04/05/2020.  Ms Micheal Likens was told that the patient was not to start axitinib until we reviewed in clinic and a start date was established.  She will be seen on Monday 04/04/2020 for her Zometa (due monthly for hypercalcemia and bone metastasis) and Thursday 04/07/2020 for pembrolizumab.  We will obtain the records from Charlott Holler, NP in [place] at [facility] [phone]) regarding her appointment yesterday.  She has prescribed hydroxyzine for pruritus.  The name of her pharmacy was requested to send her medications as her current pharmacy is in [place].  Numerous questions asked and answered.   Lequita Asal, MD

## 2020-04-01 NOTE — Interval H&P Note (Signed)
History and Physical Interval Note:  04/01/2020 1:06 PM  Mariah Thornton  has presented today for surgery, with the diagnosis of Porta Cath Placement   Renal Cell Ca RTCovid  Sept 16.  The various methods of treatment have been discussed with the patient and family. After consideration of risks, benefits and other options for treatment, the patient has consented to  Procedure(s): PORTA CATH INSERTION (N/A) as a surgical intervention.  The patient's history has been reviewed, patient examined, no change in status, stable for surgery.  I have reviewed the patient's chart and labs.  Questions were answered to the patient's satisfaction.     Leotis Pain

## 2020-04-01 NOTE — Telephone Encounter (Signed)
Oral Oncology Patient Advocate Encounter  Called and spoke to [place], patients caregiver, to let her know of approval.  She is aware that they will have to re-apply for assistance for 2022.  I instructed her that the patient was not to start the medication when received and that Dr Mike Gip would instruct her on when to start.  I called [employer] to see when medication could be delivered.  The rep stated that I could set up delivery and I scheduled the medication to be delivered on 04/05/20.  [place] Patient [place] Phone [phone] Fax [phone] 04/01/2020 11:14 AM

## 2020-04-01 NOTE — Telephone Encounter (Signed)
Called and spoke to [place], caregiver, and informed her of delivery of Inlyta for 04/05/20.  She is also aware that patient is not to start medication until instructed.  [place] Patient [place] Phone [phone] Fax [phone] 04/01/2020 11:23 AM

## 2020-04-02 NOTE — Progress Notes (Signed)
[facility]  [address], [address] [place], [place] [address] Phone: [phone]  Fax: [phone]   Clinic Day:  04/04/2020  Referring physician: Philmore Pali, NP  Chief Complaint: Mariah Thornton is a 74 y.o. female with metastatic clear cell renal cell carcinoma seen for monthly Zometa and review of treatment plan.  HPI:  The patient was last seen in the medical oncology clinic on 03/16/2020. At that time, she had lost 6 pounds in the past month. She had a 2 month history of an enlarging mass on her posterior neck.  Mentation was back to baseline after correction of her hypercalcemia. We discussed port placement, obtaining a baseline echo, bone scan and taking a chemotherapy class. Patient was agreeable to plan. She remained on oral folic acid.   She underwent port placement by Dr. Leotis Pain on 03/28/2020.   Echo on 04/01/2020 is pending.  She is scheduled for a bone scan on 04/05/2020.   During the interim, the patient was doing well. Reports a dry mouth. She has back pain. When standing she has groin pain. Pain (7/10). For pain she takes Tylenol. She was placed on hydroxyzine due to itching. After starting the medication, her nerves have calmed down and itching resolved. She does feel drowsy throughout the day. The spot on her neck is improving. She notes 2 to 3 new spots on her head.   She feels like her lower extremities are swollen. She is more mobile without her walker today. She is able to shower and dress herself. She notes that her appetite is "so so".  The patient reports that she is not taking any medication. Tye Maryland brought her Zofran and EMLA cream today. Tye Maryland reports she will receive her medication (axitinib) in the mail tomorrow. She will go to chemotherapy class with Tye Maryland and her husband.    History reviewed. No pertinent past medical history.  Past Surgical History:  Procedure Laterality Date  . PORTA CATH INSERTION N/A 03/28/2020   Procedure:  PORTA CATH INSERTION;  Surgeon: Algernon Huxley, MD;  Location: [facility];  Service: Cardiovascular;  Laterality: N/A;    History reviewed. No pertinent family history.  Social History:  reports that she has never smoked. She has never used smokeless tobacco. She reports current alcohol use of about 1.0 standard drink of alcohol per week. She reports previous drug use. reports that she has never smoked. She has never used smokeless tobacco. She reports previous alcohol use. She reports previous drug use.  The patient denies any exposure to radiation or toxins.  She states that she is retired.  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  Her husband's name is [name].  The patient lives in [place], [place]. The patient is accompanied by her friend Tye Maryland (in person) today.  Allergies: No Known Allergies  Current Medications: Current Outpatient Medications  Medication Sig Dispense Refill  . axitinib (INLYTA) 5 MG tablet Take 1 tablet (5 mg total) by mouth 2 (two) times daily. 60 tablet 0  . lidocaine-prilocaine (EMLA) cream Apply to affected area once 30 g 3  . ondansetron (ZOFRAN) 8 MG tablet Take 1 tablet (8 mg total) by mouth 2 (two) times daily as needed (Nausea or vomiting). 30 tablet 1   No current facility-administered medications for this visit.    Review of Systems  Constitutional: Positive for malaise/fatigue (drowsy during the day). Negative for chills, diaphoresis, fever and weight loss (up 8 lbs).  Doing well. Needs minimal assistance with ADLs.  HENT: Negative for congestion, ear discharge, ear pain, hearing loss, nosebleeds, sinus pain, sore throat and tinnitus.        Dry mouth.  Eyes: Negative for blurred vision and double vision.  Respiratory: Negative for cough, hemoptysis, sputum production and shortness of breath.   Cardiovascular: Positive for leg swelling. Negative for chest pain and palpitations.  Gastrointestinal: Negative for abdominal pain,  blood in stool, constipation, diarrhea, heartburn, melena, nausea and vomiting.       Appetite "so-so".  Genitourinary: Negative for dysuria, frequency, hematuria and urgency.  Musculoskeletal: Positive for back pain (for years) and myalgias (groin pain upon standing). Negative for joint pain and neck pain.       Mass on back of neck for at least 2 months, stable. She had 2-3 spots on her head now.  Skin: Negative for itching and rash.  Neurological: Negative for dizziness, tingling, sensory change, weakness and headaches.  Endo/Heme/Allergies: Does not bruise/bleed easily.  Psychiatric/Behavioral: Negative for depression and memory loss. The patient is not nervous/anxious and does not have insomnia.   All other systems reviewed and are negative.  Performance status (ECOG): 1  Vitals Blood pressure (!) 145/55, pulse 98, temperature 98.3 F (36.8 C), temperature source Tympanic, weight 150 lb 2.1 oz (68.1 kg), SpO2 99 %.   Physical Exam Vitals and nursing note reviewed.  HENT:     Head: Normocephalic and atraumatic.     Comments: Short gray hair.  Left parietal lesion 3.5 x 4.5 cm. Low occipital lesion 8 mm x 5 mm. Crown region of head lesion 1.5 cm x 1 cm.     Mouth/Throat:     Mouth: Mucous membranes are moist.     Pharynx: Oropharynx is clear.  Eyes:     General: No scleral icterus.    Extraocular Movements: Extraocular movements intact.     Conjunctiva/sclera: Conjunctivae normal.     Pupils: Pupils are equal, round, and reactive to light.     Comments: Blue eyes.  Neck:     Comments: 9.5 x 6.5 cm mass on posterior neck (previously 8 x 7 cm, see photo). Cardiovascular:     Rate and Rhythm: Normal rate and regular rhythm.     Heart sounds: Normal heart sounds. No murmur heard.   Pulmonary:     Effort: Pulmonary effort is normal. No respiratory distress.     Breath sounds: Normal breath sounds. No wheezing or rales.  Chest:     Chest wall: No tenderness.  Abdominal:      General: Bowel sounds are normal. There is no distension.     Palpations: Abdomen is soft. There is no mass.     Tenderness: There is no abdominal tenderness. There is no guarding or rebound.  Musculoskeletal:        General: Tenderness (BLE) present. No swelling. Normal range of motion.     Cervical back: Normal range of motion and neck supple.     Right lower leg: Edema present.     Left lower leg: Edema present.  Lymphadenopathy:     Head:     Right side of head: No preauricular, posterior auricular or occipital adenopathy.     Left side of head: No preauricular, posterior auricular or occipital adenopathy.     Cervical: No cervical adenopathy.     Upper Body:     Right upper body: No supraclavicular or axillary adenopathy.     Left upper body: No  supraclavicular or axillary adenopathy.     Lower Body: No right inguinal adenopathy. No left inguinal adenopathy.  Skin:    General: Skin is warm and dry.  Neurological:     Mental Status: She is alert and oriented to person, place, and time.  Psychiatric:        Thought Content: Thought content normal.        Judgment: Judgment normal.      03/16/2020     04/04/2020    No visits with results within 3 Day(s) from this visit.  Latest known visit with results is:  Hospital Outpatient Visit on 03/24/2020  Component Date Value Ref Range Status  . SARS Coronavirus 2 03/24/2020 NEGATIVE  NEGATIVE Final   Comment: (NOTE) SARS-CoV-2 target nucleic acids are NOT DETECTED.  The SARS-CoV-2 RNA is generally detectable in upper and lower respiratory specimens during the acute phase of infection. Negative results do not preclude SARS-CoV-2 infection, do not rule out co-infections with other pathogens, and should not be used as the sole basis for treatment or other patient management decisions. Negative results must be combined with clinical observations, patient history, and epidemiological information. The expected result is  Negative.  Fact Sheet for Patients: SugarRoll.be  Fact Sheet for Healthcare Providers: https://www.woods-mathews.com/  This test is not yet approved or cleared by the [place] FDA and  has been authorized for detection and/or diagnosis of SARS-CoV-2 by FDA under an Emergency Use Authorization (EUA). This EUA will remain  in effect (meaning this test can be used) for the duration of the COVID-19 declaration under Se                          ction 564(b)(1) of the Act, 21 U.S.C. section 360bbb-3(b)(1), unless the authorization is terminated or revoked sooner.  Performed at [facility], [place] [address], [place], [place] [address]     Assessment:  Mariah Thornton is a 74 y.o. female with metastatic renal cell carcinoma s/p CT guided biopsy of a 5.6 x 4.0 cm soft tissue mass in the posterior neck on 03/08/2020.  Pathology revealed metastatic carcinoma with clear cell and oncocytic features.  Cytokeratin 7 was negative, cytokeratin 20, focally positive, PAX 8 positive, GATA3 negative and p63 negative.  Based on the pattern of immunoreactivity, metastatic carcinoma is compatible with metastatic renal cell carcinoma.  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 She presented with hypercalcemia.   LDH was 245 on 03/05/2020.  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  There was a possible lytic lesion within the L2 vertebral body.  Abdomen and pelvis CT on 03/04/2020 revealed an 8.8 x 8.5 x 7.5 cm heterogeneous mass arising from the anterior right kidney.  There were numerous and large retroperitoneal lymph nodes.  There are multiple lytic osseous lesions involving the L2 and L3 vertebral bodies with clear cortical and medullary destruction.  There are multiple additional subtle osseous lucencies  throughout the spine and pelvis were suspicious for subtle metastatic disease.  There is a small right and trace left pleural effusion and multiple bilateral pulmonary nodules.  There was stigmata of cirrhosis and splenomegaly with trace ascites.  She has a history of hypercalcemia.   She received Zometa on 03/04/2020.  She has folate deficiency.  Folate was 4.8 on 03/06/2020.  She is on oral folic acid.  B12  was 2351 on 03/06/2020.  She received thePfizer COVID-19 vaccine on 11/11/2019 and 12/08/2019.  Symptomatically, she has back pain.  She has multiple lesions on her head.  Exam reveals slight enlargement of the neck mass (9.5 x 6.5 cm).   Plan: 1.Labs today: CBC with diff, BMP. 2.  Metastatic clear cell renal cell carcinoma  She has a large right renal mass, multiple lytic bone lesions, mass on her posterior neck, and multiple scalp lesions.  She is in a poor prognostic group based on [facility] criteria (metastatic disease, KPS < 80%, hypercalcemia, elevated LDH, and anemia). Re-review plans for axitinib (Inlyta) and pembrolizumab based on the KEYNOTE-426 trial.  Port placed on 03/28/2020.   Review plan to wait at least 2 weeks s/p port placement for axitinib.  Baseline bone scan on 04/05/2020.  Anticipate initiation of pembrolizumab this week.  Discuss symptom management.  She has antiemetics and pain medications at home to use on a prn bases.  Interventions are adequate.    3. Hypercalcemia Patient has hypercalcemia associated with malignancy. She last received monthly Zometa on 03/04/2020.  Zometa today. 4. Normocytic anemia Hematocrit 27.7.  Hemoglobin 8.2.  MCV 85.0 on 04/04/2020.    Etiology likely secondary to renal cell carcinoma. Patient denies any bleeding. Ferritin 660 with an iron saturation of 11% and a TIBC of 150.             Folate 4.8 (low) with a B12 of 2351.  Patient on oral folic  acid. 5.   Folate deficiency             Continue folic acid 1 mg po q day.  Check folate level in 1 month. 6.   Chemotherapy class tomorrow. 7.   RTC on 04/07/2020 for MD assessment, labs (CBC with diff, CMP, TSH), and review of echo and bone scan + cycle #1 pembrolizumab.  I discussed the assessment and treatment plan with the patient.  The patient was provided an opportunity to ask questions and all were answered.  The patient agreed with the plan and demonstrated an understanding of the instructions.  The patient was advised to call back if the symptoms worsen or if the condition fails to improve as anticipated.  I provided 30 minutes of face-to-face time during this encounter and > 50% was spent counseling as documented under my assessment and plan.  An additional 5 minutes were spent reviewing her chart (Epic and Care Everywhere) including notes, labs, and imaging studies.    Tayvian Holycross C. Mike Gip, MD, PhD    04/04/2020, 2:21 PM  I, Selena Batten, am acting as scribe for [employer]. Mike Gip, MD, PhD.  I, Kacelyn Rowzee C. Mike Gip, MD, have reviewed the above documentation for accuracy and completeness, and I agree with the above.

## 2020-04-04 ENCOUNTER — Inpatient Hospital Stay: Payer: Medicare Other

## 2020-04-04 ENCOUNTER — Inpatient Hospital Stay (HOSPITAL_BASED_OUTPATIENT_CLINIC_OR_DEPARTMENT_OTHER): Payer: Medicare Other | Admitting: Hematology and Oncology

## 2020-04-04 ENCOUNTER — Encounter: Payer: Self-pay | Admitting: Hematology and Oncology

## 2020-04-04 ENCOUNTER — Other Ambulatory Visit: Payer: Self-pay

## 2020-04-04 ENCOUNTER — Other Ambulatory Visit: Payer: Medicare Other

## 2020-04-04 ENCOUNTER — Ambulatory Visit: Payer: Medicare Other | Admitting: Hematology and Oncology

## 2020-04-04 ENCOUNTER — Ambulatory Visit: Payer: Medicare Other

## 2020-04-04 VITALS — BP 145/55 | HR 98 | Temp 98.3°F | Wt 150.1 lb

## 2020-04-04 DIAGNOSIS — C641 Malignant neoplasm of right kidney, except renal pelvis: Secondary | ICD-10-CM

## 2020-04-04 DIAGNOSIS — R590 Localized enlarged lymph nodes: Secondary | ICD-10-CM | POA: Diagnosis not present

## 2020-04-04 DIAGNOSIS — E538 Deficiency of other specified B group vitamins: Secondary | ICD-10-CM

## 2020-04-04 DIAGNOSIS — C649 Malignant neoplasm of unspecified kidney, except renal pelvis: Secondary | ICD-10-CM | POA: Diagnosis present

## 2020-04-04 DIAGNOSIS — Z79899 Other long term (current) drug therapy: Secondary | ICD-10-CM | POA: Diagnosis not present

## 2020-04-04 DIAGNOSIS — D649 Anemia, unspecified: Secondary | ICD-10-CM | POA: Diagnosis not present

## 2020-04-04 DIAGNOSIS — Z7189 Other specified counseling: Secondary | ICD-10-CM

## 2020-04-04 DIAGNOSIS — R918 Other nonspecific abnormal finding of lung field: Secondary | ICD-10-CM | POA: Diagnosis not present

## 2020-04-04 DIAGNOSIS — Z5111 Encounter for antineoplastic chemotherapy: Secondary | ICD-10-CM | POA: Diagnosis not present

## 2020-04-04 LAB — COMPREHENSIVE METABOLIC PANEL
ALT: 18 U/L (ref 0–44)
AST: 38 U/L (ref 15–41)
Albumin: 2.4 g/dL — ABNORMAL LOW (ref 3.5–5.0)
Alkaline Phosphatase: 116 U/L (ref 38–126)
Anion gap: 9 (ref 5–15)
BUN: 13 mg/dL (ref 8–23)
CO2: 21 mmol/L — ABNORMAL LOW (ref 22–32)
Calcium: 8.8 mg/dL — ABNORMAL LOW (ref 8.9–10.3)
Chloride: 108 mmol/L (ref 98–111)
Creatinine, Ser: 0.82 mg/dL (ref 0.44–1.00)
GFR calc Af Amer: >60 mL/min (ref >60)
GFR calc non Af Amer: >60 mL/min (ref >60)
Glucose, Bld: 113 mg/dL — ABNORMAL HIGH (ref 70–99)
Potassium: 4.3 mmol/L (ref 3.5–5.1)
Sodium: 138 mmol/L (ref 135–145)
Total Bilirubin: 0.6 mg/dL (ref 0.3–1.2)
Total Protein: 7 g/dL (ref 6.5–8.1)

## 2020-04-04 LAB — CBC WITH DIFFERENTIAL/PLATELET
Abs Immature Granulocytes: 0.06 10*3/uL (ref 0.00–0.07)
Basophils Absolute: 0 10*3/uL (ref 0.0–0.1)
Basophils Relative: 1 %
Eosinophils Absolute: 0.3 10*3/uL (ref 0.0–0.5)
Eosinophils Relative: 5 %
HCT: 27.7 % — ABNORMAL LOW (ref 36.0–46.0)
Hemoglobin: 8.2 g/dL — ABNORMAL LOW (ref 12.0–15.0)
Immature Granulocytes: 1 %
Lymphocytes Relative: 13 %
Lymphs Abs: 0.9 10*3/uL (ref 0.7–4.0)
MCH: 25.2 pg — ABNORMAL LOW (ref 26.0–34.0)
MCHC: 29.6 g/dL — ABNORMAL LOW (ref 30.0–36.0)
MCV: 85 fL (ref 80.0–100.0)
Monocytes Absolute: 0.7 10*3/uL (ref 0.1–1.0)
Monocytes Relative: 9 %
Neutro Abs: 5.1 10*3/uL (ref 1.7–7.7)
Neutrophils Relative %: 71 %
Platelets: 217 10*3/uL (ref 150–400)
RBC: 3.26 MIL/uL — ABNORMAL LOW (ref 3.87–5.11)
RDW: 16.8 % — ABNORMAL HIGH (ref 11.5–15.5)
WBC: 7.1 10*3/uL (ref 4.0–10.5)
nRBC: 0 % (ref 0.0–0.2)

## 2020-04-04 MED ORDER — ZOLEDRONIC ACID 4 MG/100ML IV SOLN
3.5000 mg | Freq: Once | INTRAVENOUS | Status: AC
  Administered 2020-04-04: 4 mg via INTRAVENOUS

## 2020-04-04 MED ORDER — ZOLEDRONIC ACID 4 MG/5ML IV CONC
3.5000 mg | Freq: Once | INTRAVENOUS | Status: DC
  Filled 2020-04-04: qty 4.38

## 2020-04-04 MED ORDER — SODIUM CHLORIDE 0.9 % IV SOLN
Freq: Once | INTRAVENOUS | Status: AC
  Filled 2020-04-04: qty 250

## 2020-04-04 MED ORDER — HEPARIN SOD (PORK) LOCK FLUSH 100 UNIT/ML IV SOLN
500.0000 [IU] | Freq: Once | INTRAVENOUS | Status: AC | PRN
  Administered 2020-04-04: 500 [IU]
  Filled 2020-04-04: qty 5

## 2020-04-04 MED ORDER — HEPARIN SOD (PORK) LOCK FLUSH 100 UNIT/ML IV SOLN
INTRAVENOUS | Status: AC
  Filled 2020-04-04: qty 5

## 2020-04-04 MED ORDER — ZOLEDRONIC ACID 4 MG/100ML IV SOLN: 4.0000 mg | Freq: Once | INTRAVENOUS | Status: DC

## 2020-04-04 MED ORDER — ZOLEDRONIC ACID 4 MG/100ML IV SOLN
INTRAVENOUS | Status: AC
  Filled 2020-04-04: qty 100

## 2020-04-04 NOTE — Progress Notes (Signed)
Patient c/o pain noted to her lower back and in her groin area ( pain 7)

## 2020-04-04 NOTE — Patient Instructions (Signed)
  Please confirm patient taking folic acid (folate) 1 mg a day by mouth.

## 2020-04-04 NOTE — Patient Instructions (Signed)
Pembrolizumab injection What is this medicine? PEMBROLIZUMAB (pem broe liz ue mab) is a monoclonal antibody. It is used to treat certain types of cancer. This medicine may be used for other purposes; ask your health care provider or pharmacist if you have questions. COMMON BRAND NAME(S): Keytruda What should I tell my health care provider before I take this medicine? They need to know if you have any of these conditions:  diabetes  immune system problems  inflammatory bowel disease  liver disease  lung or breathing disease  lupus  received or scheduled to receive an organ transplant or a stem-cell transplant that uses donor stem cells  an unusual or allergic reaction to pembrolizumab, other medicines, foods, dyes, or preservatives  pregnant or trying to get pregnant  breast-feeding How should I use this medicine? This medicine is for infusion into a vein. It is given by a health care professional in a hospital or clinic setting. A special MedGuide will be given to you before each treatment. Be sure to read this information carefully each time. Talk to your pediatrician regarding the use of this medicine in children. While this drug may be prescribed for children as young as 6 months for selected conditions, precautions do apply. Overdosage: If you think you have taken too much of this medicine contact a poison control center or emergency room at once. NOTE: This medicine is only for you. Do not share this medicine with others. What if I miss a dose? It is important not to miss your dose. Call your doctor or health care professional if you are unable to keep an appointment. What may interact with this medicine? Interactions have not been studied. Give your health care provider a list of all the medicines, herbs, non-prescription drugs, or dietary supplements you use. Also tell them if you smoke, drink alcohol, or use illegal drugs. Some items may interact with your medicine. This  list may not describe all possible interactions. Give your health care provider a list of all the medicines, herbs, non-prescription drugs, or dietary supplements you use. Also tell them if you smoke, drink alcohol, or use illegal drugs. Some items may interact with your medicine. What should I watch for while using this medicine? Your condition will be monitored carefully while you are receiving this medicine. You may need blood work done while you are taking this medicine. Do not become pregnant while taking this medicine or for 4 months after stopping it. Women should inform their doctor if they wish to become pregnant or think they might be pregnant. There is a potential for serious side effects to an unborn child. Talk to your health care professional or pharmacist for more information. Do not breast-feed an infant while taking this medicine or for 4 months after the last dose. What side effects may I notice from receiving this medicine? Side effects that you should report to your doctor or health care professional as soon as possible:  allergic reactions like skin rash, itching or hives, swelling of the face, lips, or tongue  bloody or black, tarry  breathing problems  changes in vision  chest pain  chills  confusion  constipation  cough  diarrhea  dizziness or feeling faint or lightheaded  fast or irregular heartbeat  fever  flushing  joint pain  low blood counts - this medicine may decrease the number of white blood cells, red blood cells and platelets. You may be at increased risk for infections and bleeding.  muscle pain  muscle   weakness  pain, tingling, numbness in the hands or feet  persistent headache  redness, blistering, peeling or loosening of the skin, including inside the mouth  signs and symptoms of high blood sugar such as dizziness; dry mouth; dry skin; fruity breath; nausea; stomach pain; increased hunger or thirst; increased urination  signs  and symptoms of kidney injury like trouble passing urine or change in the amount of urine  signs and symptoms of liver injury like dark urine, light-colored stools, loss of appetite, nausea, right upper belly pain, yellowing of the eyes or skin  sweating  swollen lymph nodes  weight loss Side effects that usually do not require medical attention (report to your doctor or health care professional if they continue or are bothersome):  decreased appetite  hair loss  muscle pain  tiredness This list may not describe all possible side effects. Call your doctor for medical advice about side effects. You may report side effects to FDA at 1-800-FDA-1088. Where should I keep my medicine? This drug is given in a hospital or clinic and will not be stored at home. NOTE: This sheet is a summary. It may not cover all possible information. If you have questions about this medicine, talk to your doctor, pharmacist, or health care provider.  2020 Elsevier/Gold Standard (2019-05-01 18:07:58)  

## 2020-04-05 ENCOUNTER — Encounter
Admission: RE | Admit: 2020-04-05 | Discharge: 2020-04-05 | Disposition: A | Discharge status: Home / Self Care | Payer: Medicare Other | Source: Ambulatory Visit | Attending: Hematology and Oncology | Admitting: Hematology and Oncology

## 2020-04-05 ENCOUNTER — Inpatient Hospital Stay (HOSPITAL_BASED_OUTPATIENT_CLINIC_OR_DEPARTMENT_OTHER): Payer: Medicare Other | Admitting: Oncology

## 2020-04-05 ENCOUNTER — Inpatient Hospital Stay: Payer: Medicare Other

## 2020-04-05 DIAGNOSIS — C641 Malignant neoplasm of right kidney, except renal pelvis: Secondary | ICD-10-CM | POA: Diagnosis present

## 2020-04-05 IMAGING — NM NM BONE WHOLE BODY
2 series · 6 of 6 positions shown · non-contrast
Comparison: None

Correlation: CT abdomen and pelvis [DATE], CT chest [DATE]

CLINICAL DATA: Lower back and pelvic pain for couple weeks, history
of renal cell carcinoma

EXAM:
NUCLEAR MEDICINE WHOLE BODY BONE SCAN
TECHNIQUE: Whole body anterior and posterior images were obtained approximately
3 hours after intravenous injection of radiopharmaceutical.
RADIOPHARMACEUTICALS:  20.917 mCi [VP] MDP IV

[Series 1000: statics · 2.40mm/px · 2 acquisitions, 4 frames shown]
[im 1/2]
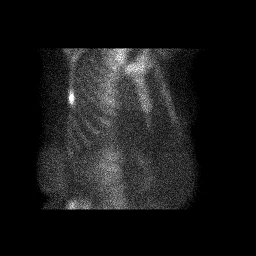
[im 1/2]
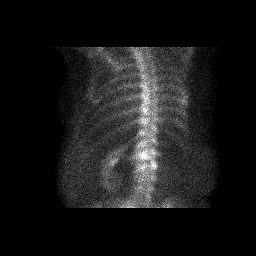
[im 2/2]
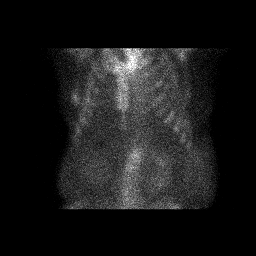
[im 2/2]
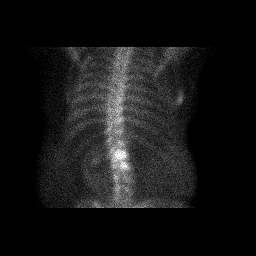

[Series 1000: 3 hr wholebody · 2.40mm/px · 2 of 2 frames shown]
[frame 1/2]
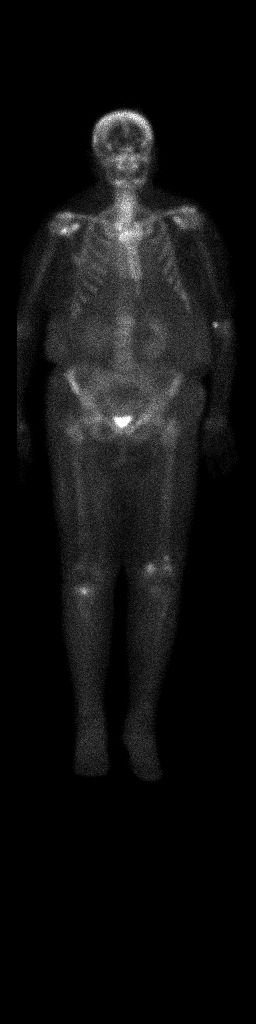
[frame 2/2]
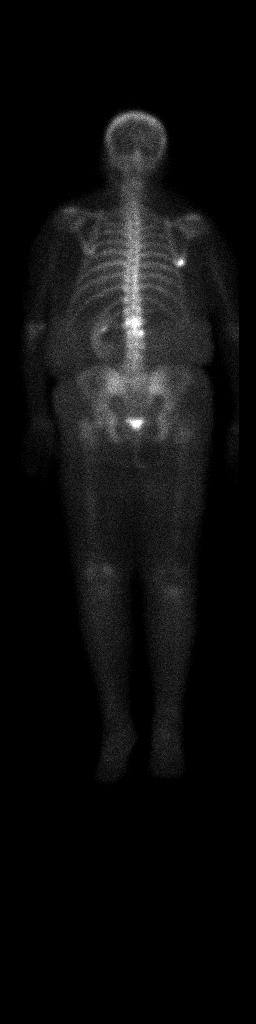

[6 of 6 positions shown; findings below may reference images not displayed]

FINDINGS: Abnormal tracer uptake identified at inferior RIGHT scapula, L1
vertebral body, RIGHT L2 vertebra, and distal LEFT femoral
metaphysis suspicious for osseous metastatic disease.

Degenerative type uptake at shoulders, sternoclavicular joints, and
knees.

Soft tissue distribution of tracer unremarkable.

Minimal RIGHT renal tracer corresponding to known large RIGHT renal
neoplasm decreased RIGHT renal function.

Photopenic defect at mid LEFT kidney corresponding to large cyst on
CT.
IMPRESSION: Foci of abnormal tracer uptake at the inferior RIGHT scapula, L1 and
L2 vertebral bodies, and distal LEFT femoral metaphysis consistent
with osseous metastases.

Large LEFT renal cyst and known large RIGHT renal neoplasm with poor
RIGHT renal function.

## 2020-04-05 MED ORDER — TECHNETIUM TC 99M MEDRONATE IV KIT
20.9170 | PACK | Freq: Once | INTRAVENOUS | Status: AC | PRN
  Administered 2020-04-05: 20.917 via INTRAVENOUS

## 2020-04-05 NOTE — Progress Notes (Signed)
[place]  [phone] [phone]  Patient Care Team: Philmore Pali, NP as PCP - General (Nurse Practitioner)   Name of the patient: Mariah Thornton  [id number]  22-Jun-1946   Date of visit: 04/06/20  Diagnosis-Renal Cell  Chief complaint/Reason for visit- Initial Meeting for [facility], preparing for starting chemotherapy  Heme/Onc history:  Oncology History  Renal cell carcinoma of right kidney ([place])  03/16/2020 Initial Diagnosis   Renal cell carcinoma of right kidney ([place])   04/04/2020 -  Chemotherapy   The patient had pembrolizumab (KEYTRUDA) 200 mg in sodium chloride 0.9 % 50 mL chemo infusion, 200 mg, Intravenous, Once, 0 of 6 cycles  for chemotherapy treatment.      Interval history-  Mariah Thornton is a 74 yo female who presents to chemo care clinic today for initial meeting in preparation for starting chemotherapy. I introduced the chemo care clinic and we discussed that the role of the clinic is to assist those who are at an increased risk of emergency room visits and/or complications during the course of chemotherapy treatment. We discussed that the increased risk takes into account factors such as age, performance status, and co-morbidities. We also discussed that for some, this might include barriers to care such as not having a primary care provider, lack of insurance/transportation, or not being able to afford medications. We discussed that the goal of the program is to help prevent unplanned ER visits and help reduce complications during chemotherapy. We do this by discussing specific risk factors to each individual and identifying ways that we can help improve these risk factors and reduce barriers to care.   ECOG FS:0 - Asymptomatic  Review of systems- Review of Systems  Constitutional: Negative.  Negative for chills, fever, malaise/fatigue and weight loss.  HENT: Negative for congestion, ear pain  and tinnitus.   Eyes: Negative.  Negative for blurred vision and double vision.  Respiratory: Negative.  Negative for cough, sputum production and shortness of breath.   Cardiovascular: Negative.  Negative for chest pain, palpitations and leg swelling.  Gastrointestinal: Negative.  Negative for abdominal pain, constipation, diarrhea, nausea and vomiting.  Genitourinary: Negative for dysuria, frequency and urgency.  Musculoskeletal: Negative for back pain and falls.  Skin: Negative.  Negative for rash.  Neurological: Negative.  Negative for weakness and headaches.  Endo/Heme/Allergies: Negative.  Does not bruise/bleed easily.  Psychiatric/Behavioral: Negative.  Negative for depression. The patient is not nervous/anxious and does not have insomnia.      Current treatment- Inlyta and Keytruda  No Known Allergies  No past medical history on file.  Past Surgical History:  Procedure Laterality Date  . PORTA CATH INSERTION N/A 03/28/2020   Procedure: PORTA CATH INSERTION;  Surgeon: Algernon Huxley, MD;  Location: [place] [facility];  Service: Cardiovascular;  Laterality: N/A;    Social History   Socioeconomic History  . Marital status: Married    Spouse name: Not on file  . Number of children: Not on file  . Years of education: Not on file  . Highest education level: Not on file  Occupational History  . Not on file  Tobacco Use  . Smoking status: Never Smoker  . Smokeless tobacco: Never Used  Vaping Use  . Vaping Use: Never used  Substance and Sexual Activity  . Alcohol use: Yes    Alcohol/week: 1.0 standard drink    Types: 1 Cans of beer per week  Comment: occasional  . Drug use: Not Currently  . Sexual activity: Not on file  Other Topics Concern  . Not on file  Social History Narrative   Lives at home with husband   Social Determinants of Health   Financial Resource Strain:   . Difficulty of Paying Living Expenses: Not on file  Food Insecurity:   . Worried About  Charity fundraiser in the Last Year: Not on file  . Ran Out of Food in the Last Year: Not on file  Transportation Needs:   . Lack of Transportation (Medical): Not on file  . Lack of Transportation (Non-Medical): Not on file  Physical Activity:   . Days of Exercise per Week: Not on file  . Minutes of Exercise per Session: Not on file  Stress:   . Feeling of Stress : Not on file  Social Connections:   . Frequency of Communication with Friends and Family: Not on file  . Frequency of Social Gatherings with Friends and Family: Not on file  . Attends Religious Services: Not on file  . Active Member of Clubs or Organizations: Not on file  . Attends Archivist Meetings: Not on file  . Marital Status: Not on file  Intimate Partner Violence:   . Fear of Current or Ex-Partner: Not on file  . Emotionally Abused: Not on file  . Physically Abused: Not on file  . Sexually Abused: Not on file    No family history on file.   Current Outpatient Medications:  .  axitinib (INLYTA) 5 MG tablet, Take 1 tablet (5 mg total) by mouth 2 (two) times daily. (Patient not taking: Reported on 04/04/2020), Disp: 60 tablet, Rfl: 0 .  hydrOXYzine (ATARAX/VISTARIL) 25 MG tablet, Take 25 mg by mouth every 8 (eight) hours as needed., Disp: , Rfl:  .  lidocaine-prilocaine (EMLA) cream, Apply to affected area once (Patient not taking: Reported on 04/04/2020), Disp: 30 g, Rfl: 3 .  ondansetron (ZOFRAN) 8 MG tablet, Take 1 tablet (8 mg total) by mouth 2 (two) times daily as needed (Nausea or vomiting). (Patient not taking: Reported on 04/04/2020), Disp: 30 tablet, Rfl: 1  Physical exam: There were no vitals filed for this visit. Physical Exam Constitutional:      Appearance: Normal appearance.  HENT:     Head: Normocephalic and atraumatic.  Eyes:     Pupils: Pupils are equal, round, and reactive to light.  Cardiovascular:     Rate and Rhythm: Normal rate and regular rhythm.     Heart sounds: Normal heart  sounds. No murmur heard.   Pulmonary:     Effort: Pulmonary effort is normal.     Breath sounds: Normal breath sounds. No wheezing.  Abdominal:     General: Bowel sounds are normal. There is no distension.     Palpations: Abdomen is soft.     Tenderness: There is no abdominal tenderness.  Musculoskeletal:        General: Normal range of motion.     Cervical back: Normal range of motion.  Skin:    General: Skin is warm and dry.     Findings: No rash.  Neurological:     Mental Status: She is alert and oriented to person, place, and time.  Psychiatric:        Judgment: Judgment normal.      CMP Latest Ref Rng & Units 04/04/2020  Glucose 70 - 99 mg/dL 113(H)  BUN 8 - 23 mg/dL 13  Creatinine 0.44 - 1.00 mg/dL 0.82  Sodium 135 - 145 mmol/L 138  Potassium 3.5 - 5.1 mmol/L 4.3  Chloride 98 - 111 mmol/L 108  CO2 22 - 32 mmol/L 21(L)  Calcium 8.9 - 10.3 mg/dL 8.8(L)  Total Protein 6.5 - 8.1 g/dL 7.0  Total Bilirubin 0.3 - 1.2 mg/dL 0.6  Alkaline Phos 38 - 126 U/L 116  AST 15 - 41 U/L 38  ALT 0 - 44 U/L 18   CBC Latest Ref Rng & Units 04/04/2020  WBC 4.0 - 10.5 K/uL 7.1  Hemoglobin 12.0 - 15.0 g/dL 8.2(L)  Hematocrit 36 - 46 % 27.7(L)  Platelets 150 - 400 K/uL 217    No images are attached to the encounter.  NM Bone Scan Whole Body  Result Date: 04/06/2020 CLINICAL DATA:  Lower back and pelvic pain for couple weeks, history of renal cell carcinoma EXAM: NUCLEAR MEDICINE WHOLE BODY BONE SCAN TECHNIQUE: Whole body anterior and posterior images were obtained approximately 3 hours after intravenous injection of radiopharmaceutical. RADIOPHARMACEUTICALS:  20.917 mCi Technetium-32m MDP IV COMPARISON:  None Correlation: CT abdomen and pelvis 03/04/2020, CT chest 03/03/2020 FINDINGS: Abnormal tracer uptake identified at inferior RIGHT scapula, L1 vertebral body, RIGHT L2 vertebra, and distal LEFT femoral metaphysis suspicious for osseous metastatic disease. Degenerative type uptake at  shoulders, sternoclavicular joints, and knees. Soft tissue distribution of tracer unremarkable. Minimal RIGHT renal tracer corresponding to known large RIGHT renal neoplasm decreased RIGHT renal function. Photopenic defect at mid LEFT kidney corresponding to large cyst on CT. IMPRESSION: Foci of abnormal tracer uptake at the inferior RIGHT scapula, L1 and L2 vertebral bodies, and distal LEFT femoral metaphysis consistent with osseous metastases. Large LEFT renal cyst and known large RIGHT renal neoplasm with poor RIGHT renal function. Electronically Signed   By: Lavonia Dana M.D.   On: 04/06/2020 11:23   PERIPHERAL VASCULAR CATHETERIZATION  Result Date: 03/28/2020 See op note  CT BIOPSY  Result Date: 03/08/2020 INDICATION: 74 year old with presumed metastatic disease. Patient has a large right renal lesion, lung nodules and scattered bone lesions. Patient was initially scheduled for a L2 bone lesion biopsy. Patient was noted to have a large soft tissue mass in the posterior neck after she was placed prone on the CT scanner. Discussed with Dr. Mike Gip in Oncology and we decided to proceed with biopsy of this neck soft tissue mass. EXAM: CT-GUIDED CORE BIOPSY OF POSTERIOR NECK SOFT TISSUE MASS MEDICATIONS: None. ANESTHESIA/SEDATION: Moderate (conscious) sedation was employed during this procedure. A total of Versed 1.0 mg and Fentanyl 50 mcg was administered intravenously. Moderate Sedation Time: 16 minutes. The patient's level of consciousness and vital signs were monitored continuously by radiology nursing throughout the procedure under my direct supervision. FLUOROSCOPY TIME:  None COMPLICATIONS: None immediate. PROCEDURE: Informed written consent was obtained from the patient after a thorough discussion of the procedural risks, benefits and alternatives. All questions were addressed. A timeout was performed prior to the initiation of the procedure. Patient was placed prone and CT images were obtained.  Initially, CT images were obtained of the abdomen. After examination of the patient and discovery of a left large soft tissue mass in the posterior neck, we got CT imaging of the neck. CT confirmed a large superficial soft tissue mass in the posterior neck. This corresponds with the soft tissue mass on the previous ultrasound from 02/19/2020. Informed consent was obtained for biopsy of the soft tissue mass from the patient. The right side of the mass and adjacent skin  was prepped with chlorhexidine and sterile field was created. Skin and soft tissues were anesthetized using 1% lidocaine. Using CT guidance, a 17 gauge coaxial needle was directed into the lesion. Multiple core biopsies were obtained with an 18 gauge core device. Specimens placed in formalin. Needle was removed without complication. Bandage placed over the puncture site. FINDINGS: Large soft tissue mass in the superficial posterior soft tissues of the neck. This lesion measures 5.6 x 4.0 cm on the axial images. Small nodules at the right lung apex. Large mass involving the right kidney with retroperitoneal lymphadenopathy and lucent bone lesions in lumbar spine. IMPRESSION: CT-guided core biopsy of the large superficial soft tissue mass in the posterior neck. Electronically Signed   By: Markus Daft M.D.   On: 03/08/2020 15:40     Assessment and plan- Patient is a 74 y.o. female who presents to [facility] for initial meeting in preparation for starting chemotherapy for the treatment of stage IV renal cell carcinoma.    1. HPI: Mrs. Arps is a 74 year old female with metastatic renal cell carcinoma status post CT-guided biopsy soft tissue mass of her posterior neck.  Pathology revealed metastatic carcinoma with clear cell features.  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  There was also lytic  lesions.  Plan is to begin Inlyta and pembrolizumab under the care of Dr. Mike Gip.   2. Chemo Care Clinic/High Risk for ER/Hospitalization during chemotherapy- We discussed the role of the chemo care clinic and identified patient specific risk factors. I discussed that patient was identified as high risk primarily based on: Stage of disease.  Patient has past medical history positive for: No past medical history on file.  Patient has past surgical history positive for: Past Surgical History:  Procedure Laterality Date  . PORTA CATH INSERTION N/A 03/28/2020   Procedure: PORTA CATH INSERTION;  Surgeon: Algernon Huxley, MD;  Location: [place] [facility];  Service: Cardiovascular;  Laterality: N/A;    Based on our high risk symptom management report; this patient has a high risk of ED utilization.  The percentage below indicates how "at risk "  this patient based on the factors in this table within one year.   General Risk Score: 7  Values used to calculate this score:   Points  Metrics      1        Age: 5      [facility] Admissions: 3      3        ED Visits: 3      0        Has Chronic Obstructive Pulmonary Disease: No      0        Has Diabetes: No      0        Has Congestive Heart Failure: No      0        Has liver disease: No      0        Has Depression: No      0        Current PCP: Philmore Pali, NP      0        Has Medicaid: No    3. We discussed that social determinants of health may have significant impacts on health and outcomes  for cancer patients.  Today we discussed specific social determinants of performance status, alcohol use, depression, financial needs, food insecurity, housing, interpersonal violence, social connections, stress, tobacco use, and transportation.    After lengthy discussion the following were identified as areas of need: None at this time.    Outpatient services: We discussed options including home based and outpatient services, DME and care  program. We discusssed that patients who participate in regular physical activity report fewer negative impacts of cancer and treatments and report less fatigue.   Financial Concerns: We discussed that living with cancer can create tremendous financial burden.  We discussed options for assistance. I asked that if assistance is needed in affording medications or paying bills to please let us know so that we can provide assistance. We discussed options for food including social services, [employer] ($50 every 2 weeks) and onsite food pantry.  We will also notify [facility] to see if cancer center can provide additional support.  Referral to Social work: Introduced Education officer, museum Elease Etienne and the services he can provide such as support with [employer], cell phone and gas vouchers.   Support groups: We discussed options for support groups at the cancer center. If interested, please notify nurse navigator to enroll. We discussed options for managing stress including healthy eating, exercise as well as participating in no charge counseling services at the cancer center and support groups.  If these are of interest, patient can notify either myself or primary nursing team.We discussed options for management including medications and referral to quit Smart program  Transportation: We discussed options for transportation including acta, paratransit, bus routes, link transit, taxi/uber/lyft, and cancer center [place].  I have notified primary oncology team who will help assist with arranging Lucianne Lei transportation for appointments when/if needed. We also discussed options for transportation on short notice/acute visits.  Palliative care services: We have palliative care services available in the cancer center to discuss goals of care and advanced care planning.  Please let us know if you have any questions or would like to speak to our palliative nurse practitioner.  Symptom Management Clinic: We  discussed our symptom management clinic which is available for acute concerns while receiving treatment such as nausea, vomiting or diarrhea.  We can be reached via telephone at [phone] or through my chart.  We are available for virtual or in person visits on the same day from 830 to 4 PM Monday through Friday. She denies needing specific assistance at this time and She will be followed by Dr. Mike Gip  clinical team.  Plan: Discussed symptom management clinic. Discussed palliative care services. Discussed resources that are available here at the cancer center. Discussed medications and new prescriptions to begin treatment such as anti-nausea or steroids.   Disposition: RTC on 04/06/20 for echo. RTC on 04/07/20 for cycle 1.   Visit Diagnosis 1. Renal cell carcinoma of right kidney [facility])     Patient expressed understanding and was in agreement with this plan. She also understands that She can call clinic at any time with any questions, concerns, or complaints.   Greater than 50% was spent in counseling and coordination of care with this patient including but not limited to discussion of the relevant topics above (See A&P) including, but not limited to diagnosis and management of acute and chronic medical conditions.    [place] [facility] [place]  CC: Dr. Mike Gip

## 2020-04-06 ENCOUNTER — Other Ambulatory Visit: Payer: Self-pay

## 2020-04-06 ENCOUNTER — Ambulatory Visit (HOSPITAL_COMMUNITY)
Admission: RE | Admit: 2020-04-06 | Discharge: 2020-04-06 | Disposition: A | Discharge status: Home / Self Care | Payer: Medicare Other | Source: Ambulatory Visit | Attending: Hematology and Oncology | Admitting: Hematology and Oncology

## 2020-04-06 DIAGNOSIS — C641 Malignant neoplasm of right kidney, except renal pelvis: Secondary | ICD-10-CM | POA: Diagnosis not present

## 2020-04-06 DIAGNOSIS — I517 Cardiomegaly: Secondary | ICD-10-CM

## 2020-04-06 DIAGNOSIS — Z01818 Encounter for other preprocedural examination: Secondary | ICD-10-CM | POA: Diagnosis not present

## 2020-04-06 DIAGNOSIS — C649 Malignant neoplasm of unspecified kidney, except renal pelvis: Secondary | ICD-10-CM | POA: Insufficient documentation

## 2020-04-06 DIAGNOSIS — Z0189 Encounter for other specified special examinations: Secondary | ICD-10-CM | POA: Diagnosis not present

## 2020-04-06 LAB — ECHOCARDIOGRAM COMPLETE
AR max vel: 2.08 cm2
AV Area VTI: 2.61 cm2
AV Area mean vel: 2.41 cm2
AV Mean grad: 9 mmHg
AV Peak grad: 18.7 mmHg
Ao pk vel: 2.16 m/s
Area-P 1/2: 2.99 cm2
S' Lateral: 2.3 cm

## 2020-04-06 NOTE — Progress Notes (Signed)
  Echocardiogram 2D Echocardiogram has been performed.  Mariah Thornton 04/06/2020, 3:41 PM

## 2020-04-06 NOTE — Progress Notes (Signed)
[facility]  [address], [address] [place], [place] [address] Phone: [phone]  Fax: [phone]   Clinic Day:  04/07/2020  Referring physician: Philmore Pali, NP  Chief Complaint: Mariah Thornton is a 74 y.o. female with metastatic clear cell renal cell carcinoma seen for review of bone scan and initiation of cycle #1 pembrolizumab.  HPI:  The patient was last seen in the medical oncology clinic on 04/04/2020.   At that time, she noted back pain.   Pruritis had resolved with hydroxyzine.  She noted 2 new spots on her head.  Hematocrit was 27.7, hemoglobin 8.2, MCV 85.0, platelets 217,000, WBC 7,100.  Calcium was 8.8 and albumin 2.4. She received Zometa.  The patient saw Faythe Casa NP in the chemotherapy care clinic on 04/05/2020.   Bone scan on 04/05/2020 revealed foci of abnormal tracer uptake at the inferior RIGHT scapula, L1 and L2 vertebral bodies, and distal LEFT femoral metaphysis c/w osseous metastases.  She has a large LEFT renal cyst and known large RIGHT renal neoplasm with poor RIGHT renal function.  Echo on 04/06/2020 revealed an EF of 70-75%.  During the interim, she had been "good."  She has back pain, which she takes Tylenol. She reports mild constipation, dry mouth, and groin pain. She drank prune juice the other day, which helped. She is taking folic acid.  The patient agrees to a lumbar spine MRI and left leg x ray.  She is ready to start treatment today.   History reviewed. No pertinent past medical history.  Past Surgical History:  Procedure Laterality Date  . PORTA CATH INSERTION N/A 03/28/2020   Procedure: PORTA CATH INSERTION;  Surgeon: Algernon Huxley, MD;  Location: [facility];  Service: Cardiovascular;  Laterality: N/A;    History reviewed. No pertinent family history.  Social History:  reports that she has never smoked. She has never used smokeless tobacco. She reports current alcohol use of about 1.0 standard drink  of alcohol per week. She reports previous drug use. reports that she has never smoked. She has never used smokeless tobacco. She reports previous alcohol use. She reports previous drug use.  The patient denies any exposure to radiation or toxins.  She states that she is retired.  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  Her husband's name is [name].  The patient lives in [place], [place]. The patient is accompanied by her friend Tye Maryland (in person) today.  Allergies: No Known Allergies  Current Medications: Current Outpatient Medications  Medication Sig Dispense Refill  . hydrOXYzine (ATARAX/VISTARIL) 25 MG tablet Take 25 mg by mouth every 8 (eight) hours as needed.    Marland Kitchen axitinib (INLYTA) 5 MG tablet Take 1 tablet (5 mg total) by mouth 2 (two) times daily. (Patient not taking: Reported on 04/04/2020) 60 tablet 0  . lidocaine-prilocaine (EMLA) cream Apply to affected area once (Patient not taking: Reported on 04/04/2020) 30 g 3  . ondansetron (ZOFRAN) 8 MG tablet Take 1 tablet (8 mg total) by mouth 2 (two) times daily as needed (Nausea or vomiting). (Patient not taking: Reported on 04/04/2020) 30 tablet 1   No current facility-administered medications for this visit.    Review of Systems  Constitutional: Positive for weight loss (1 lb). Negative for chills, diaphoresis, fever and malaise/fatigue.       Feels "good."  HENT: Negative for congestion, ear discharge, ear pain, hearing loss, nosebleeds, sinus pain, sore throat and tinnitus.  Dry mouth.  Eyes: Negative for blurred vision and double vision.  Respiratory: Negative for cough, hemoptysis, sputum production and shortness of breath.   Cardiovascular: Negative for chest pain, palpitations and leg swelling.  Gastrointestinal: Positive for constipation. Negative for abdominal pain, blood in stool, diarrhea, heartburn, melena, nausea and vomiting.       Drinks Ensure.  Genitourinary: Negative for dysuria, frequency, hematuria and  urgency.  Musculoskeletal: Positive for back pain (for years) and myalgias (groin pain upon standing). Negative for joint pain and neck pain.  Skin: Negative for itching and rash.  Neurological: Negative for dizziness, tingling, sensory change, weakness and headaches.  Endo/Heme/Allergies: Does not bruise/bleed easily.  Psychiatric/Behavioral: Negative for depression and memory loss. The patient is not nervous/anxious and does not have insomnia.   All other systems reviewed and are negative.  Performance status (ECOG): 1-2  Vitals Blood pressure (!) 135/47, pulse 91, temperature 98.3 F (36.8 C), temperature source Tympanic, weight 149 lb 12.8 oz (67.9 kg), SpO2 100 %.  Physical Exam Vitals and nursing note reviewed.  HENT:     Head: Normocephalic and atraumatic.     Mouth/Throat:     Mouth: Mucous membranes are dry.     Pharynx: Oropharynx is clear.  Eyes:     General: No scleral icterus.    Extraocular Movements: Extraocular movements intact.     Conjunctiva/sclera: Conjunctivae normal.     Pupils: Pupils are equal, round, and reactive to light.  Cardiovascular:     Rate and Rhythm: Normal rate and regular rhythm.     Heart sounds: Normal heart sounds. No murmur heard.   Pulmonary:     Effort: Pulmonary effort is normal. No respiratory distress.     Breath sounds: Normal breath sounds. No wheezing or rales.  Abdominal:     General: Bowel sounds are normal.     Palpations: There is no mass.     Tenderness: There is no abdominal tenderness.  Musculoskeletal:        General: Tenderness (BLE) present. No swelling. Normal range of motion.     Cervical back: Normal range of motion and neck supple.     Right lower leg: Edema present.     Left lower leg: Edema present.  Skin:    General: Skin is warm and dry.  Neurological:     Mental Status: She is alert and oriented to person, place, and time. Mental status is at baseline.  Psychiatric:        Behavior: Behavior normal.          Thought Content: Thought content normal.        Judgment: Judgment normal.      03/16/2020     04/04/2020    Hospital Outpatient Visit on 04/06/2020  Component Date Value Ref Range Status  . S' Lateral 04/06/2020 2.30  cm Final  . AV Area VTI 04/06/2020 2.61  cm2 Final  . AV Mean grad 04/06/2020 9.0  mmHg Final  . AV Area mean vel 04/06/2020 2.41  cm2 Final  . Area-P 1/2 04/06/2020 2.99  cm2 Final  . AR max vel 04/06/2020 2.08  cm2 Final  . AV Peak grad 04/06/2020 18.7  mmHg Final  . Ao pk vel 04/06/2020 2.16  m/s Final    Assessment:  Mariah Thornton is a 74 y.o. female with metastatic renal cell carcinoma s/p CT guided biopsy of a 5.6 x 4.0 cm soft tissue mass in the posterior neck on 03/08/2020.  Pathology revealed  metastatic carcinoma with clear cell and oncocytic features.  Cytokeratin 7 was negative, cytokeratin 20, focally positive, PAX 8 positive, GATA3 negative and p63 negative.  Based on the pattern of immunoreactivity, metastatic carcinoma is compatible with metastatic renal cell carcinoma.  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 She presented with hypercalcemia.   LDH was 245 on 03/05/2020.  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  There was a possible lytic lesion within the L2 vertebral body.  Abdomen and pelvis CT on 03/04/2020 revealed an 8.8 x 8.5 x 7.5 cm heterogeneous mass arising from the anterior right kidney.  There were numerous and large retroperitoneal lymph nodes.  There are multiple lytic osseous lesions involving the L2 and L3 vertebral bodies with clear cortical and medullary destruction.  There are multiple additional subtle osseous lucencies throughout the spine and pelvis were suspicious for subtle metastatic disease.  There is a small right and trace left pleural effusion and multiple  bilateral pulmonary nodules.  There was stigmata of cirrhosis and splenomegaly with trace ascites.  Bone scan on 04/05/2020 revealed foci of abnormal tracer uptake at the inferior RIGHT scapula, L1 and L2 vertebral bodies, and distal LEFT femoral metaphysis c/w osseous metastases.   She has a history of hypercalcemia.   She received Zometa on 03/04/2020 and 04/04/2020.  Echo on 04/06/2020 revealed an EF of 70-75%.  She has folate deficiency.  Folate was 4.8 on 03/06/2020.  She is on oral folic acid.  B12 was 2351 on 03/06/2020.  She received the [place] COVID-19 vaccine on 11/11/2019 and 12/08/2019.  Symptomatically, she is doing well.  Pain is controlled with Tylenol.   Plan: 1.Labs today:  CBC with diff, CMP, TSH, free T4. 2.  Metastatic clear cell renal cell carcinoma She has a large right renal mass, multiple lytic bone lesions, and a mass on her posterior neck.  She is in a poor prognostic group based on [facility] criteria (metastatic disease, KPS < 80%, hypercalcemia, elevated LDH, and anemia). Plan for axitinib (Inlyta) and pembrolizumab based on the [facility] trial.  Labs reviewed.  Begin cycle #1 pembrolizumab today.   Patient consented to treatment.  Review interval bone scan.  Agree with radiology findings.   Patient has metastatic disease in the right scapula, L1 and L2 and distal left femoral metaphysis.  Discuss plan for follow-up plain films of weight bearing lesions.  Discuss plan for lumbar spine MRI (area of pain). 3. Hypercalcemia Patient has hypercalcemia associated with malignancy. Continue monthly Zometa (last 04/04/2020). 4. Normocytic anemia Etiology likely secondary to renal cell carcinoma. Patient denies any bleeding. Ferritin 660 with an iron saturation of 11% and a TIBC of 150.             Folate 4.8 (low) with a B12 of 2351.  She is on folic acid. 5.   Cancer-related  pain  Patient notes pain in lower back corresponding to bone scan.  Schedule lumbar spine MRI.  Pain is currently managed with Tylenol.  She may be a candidate for future local radiation. 6.   Cycle #1 pembrolizumab today. 7.   Plain films left femur today.  8.   Lumbar spine MRI on 04/15/2020. 9.   RTC in 1 week for MD assessment, labs (CBC with diff, CMP) and initiation of axitinib.  I discussed the assessment and treatment plan with the patient.  The patient was provided an opportunity  to ask questions and all were answered.  The patient agreed with the plan and demonstrated an understanding of the instructions.  The patient was advised to call back if the symptoms worsen or if the condition fails to improve as anticipated.  I provided 13 minutes of face-to-face time during this this encounter and > 50% was spent counseling as documented under my assessment and plan.  An additional 5-6 minutes were spent reviewing her chart (Epic and Care Everywhere) including notes, labs, and imaging studies.    Amahri Dengel C. Mike Gip, MD, PhD    04/07/2020, 8:58 AM  I, Mirian Mo Tufford, am acting as Education administrator for [employer]. Mike Gip, MD, PhD.  I, Jiovany Scheffel C. Mike Gip, MD, have reviewed the above documentation for accuracy and completeness, and I agree with the above.

## 2020-04-07 ENCOUNTER — Encounter: Payer: Self-pay | Admitting: Hematology and Oncology

## 2020-04-07 ENCOUNTER — Inpatient Hospital Stay: Payer: Medicare Other

## 2020-04-07 ENCOUNTER — Ambulatory Visit
Admission: RE | Admit: 2020-04-07 | Discharge: 2020-04-07 | Disposition: A | Discharge status: Home / Self Care | Payer: Medicare Other | Attending: Hematology and Oncology | Admitting: Hematology and Oncology

## 2020-04-07 ENCOUNTER — Ambulatory Visit
Admission: RE | Admit: 2020-04-07 | Discharge: 2020-04-07 | Disposition: A | Discharge status: Home / Self Care | Payer: Medicare Other | Source: Ambulatory Visit | Attending: Hematology and Oncology | Admitting: Hematology and Oncology

## 2020-04-07 ENCOUNTER — Inpatient Hospital Stay (HOSPITAL_BASED_OUTPATIENT_CLINIC_OR_DEPARTMENT_OTHER): Payer: Medicare Other | Admitting: Hematology and Oncology

## 2020-04-07 VITALS — BP 135/47 | HR 91 | Temp 98.3°F | Wt 149.8 lb

## 2020-04-07 DIAGNOSIS — G893 Neoplasm related pain (acute) (chronic): Secondary | ICD-10-CM

## 2020-04-07 DIAGNOSIS — C7951 Secondary malignant neoplasm of bone: Secondary | ICD-10-CM

## 2020-04-07 DIAGNOSIS — D649 Anemia, unspecified: Secondary | ICD-10-CM | POA: Diagnosis not present

## 2020-04-07 DIAGNOSIS — Z7189 Other specified counseling: Secondary | ICD-10-CM

## 2020-04-07 DIAGNOSIS — E538 Deficiency of other specified B group vitamins: Secondary | ICD-10-CM

## 2020-04-07 DIAGNOSIS — C641 Malignant neoplasm of right kidney, except renal pelvis: Secondary | ICD-10-CM

## 2020-04-07 DIAGNOSIS — Z5112 Encounter for antineoplastic immunotherapy: Secondary | ICD-10-CM

## 2020-04-07 DIAGNOSIS — Z5111 Encounter for antineoplastic chemotherapy: Secondary | ICD-10-CM | POA: Diagnosis not present

## 2020-04-07 IMAGING — CR DG FEMUR 2+V*L*
4 series · 4 of 4 positions shown · non-contrast
Comparison: Bone scan [DATE]

CLINICAL DATA: Left femur met seen on bone scan. Evaluate bone
integrity.

EXAM:
LEFT FEMUR 2 VIEWS

[femur ap (1 of 2)]
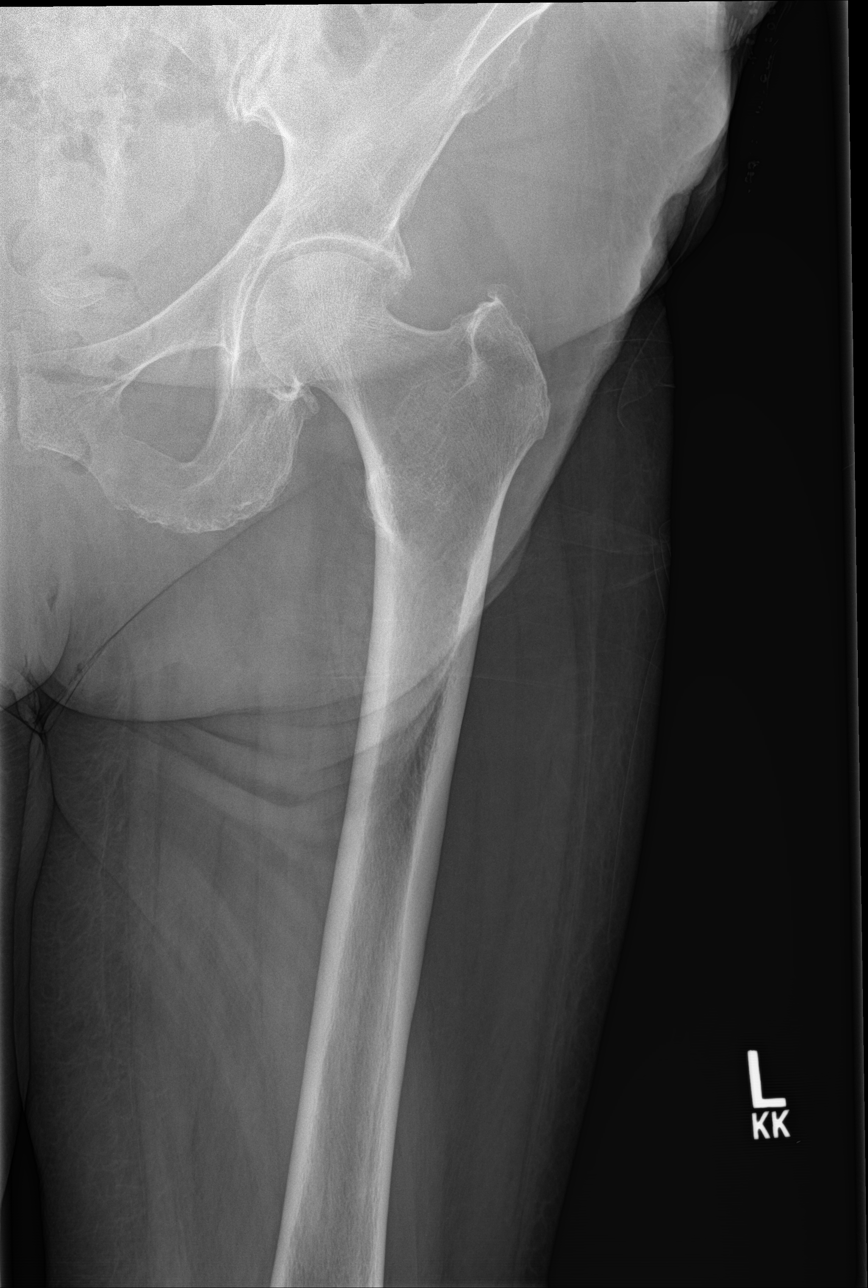

[femur ap (2 of 2)]
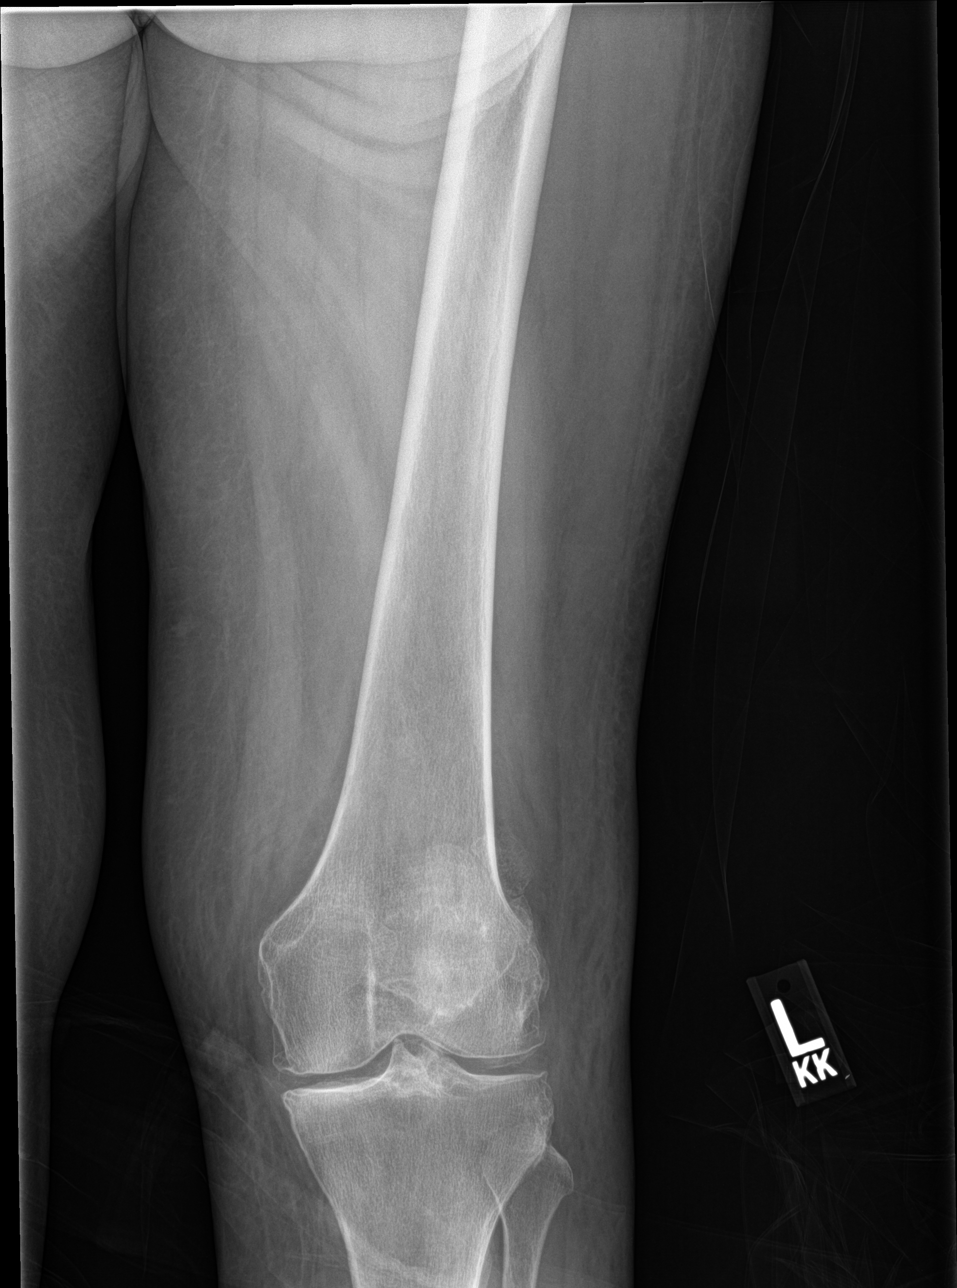

[femur lat (1 of 2)]
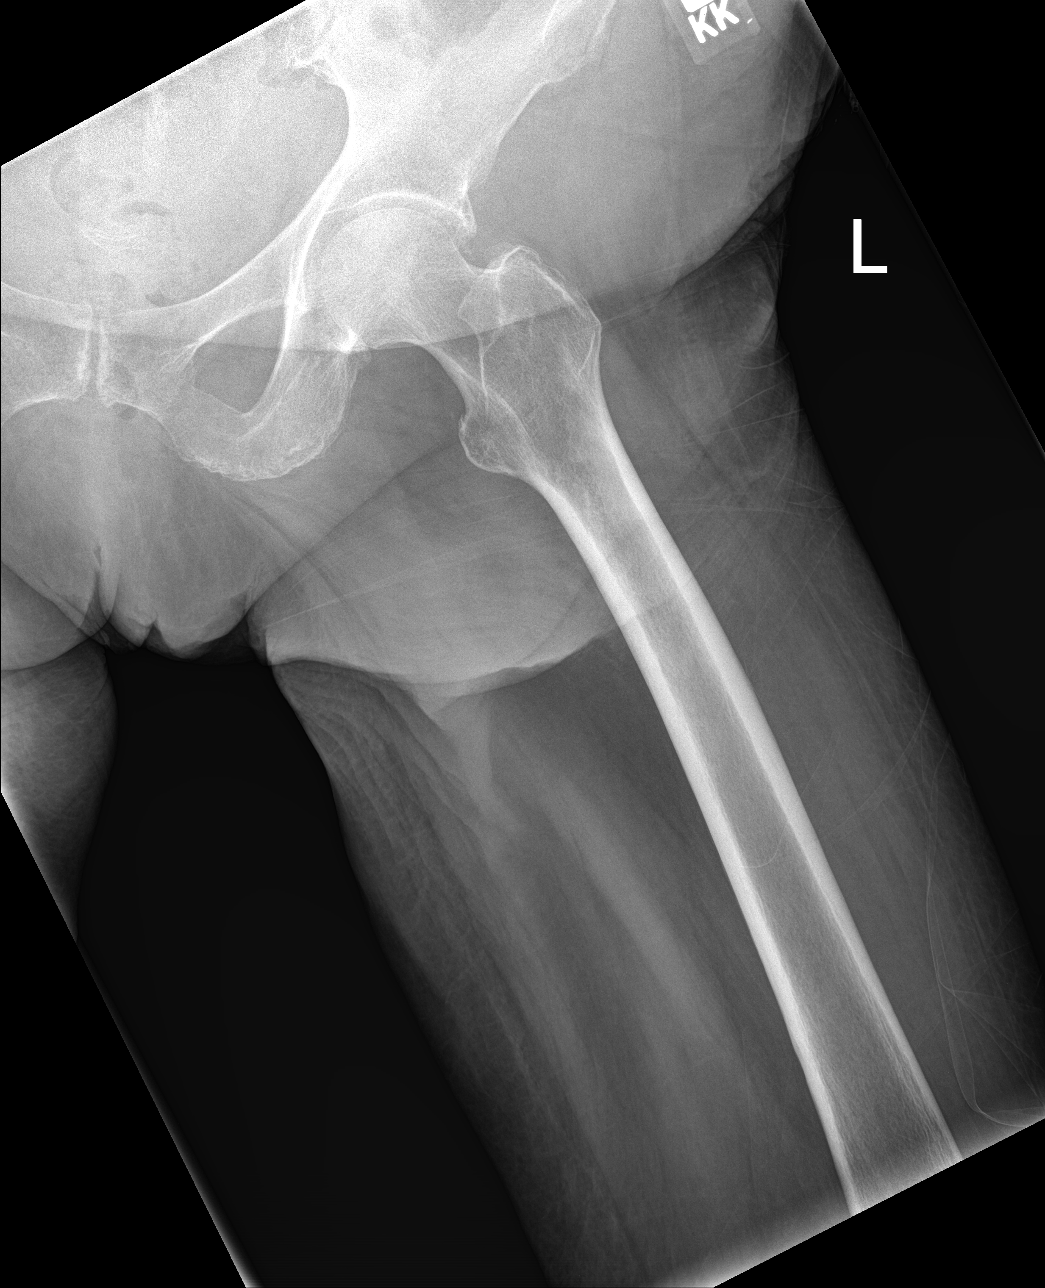

[femur lat (2 of 2)]
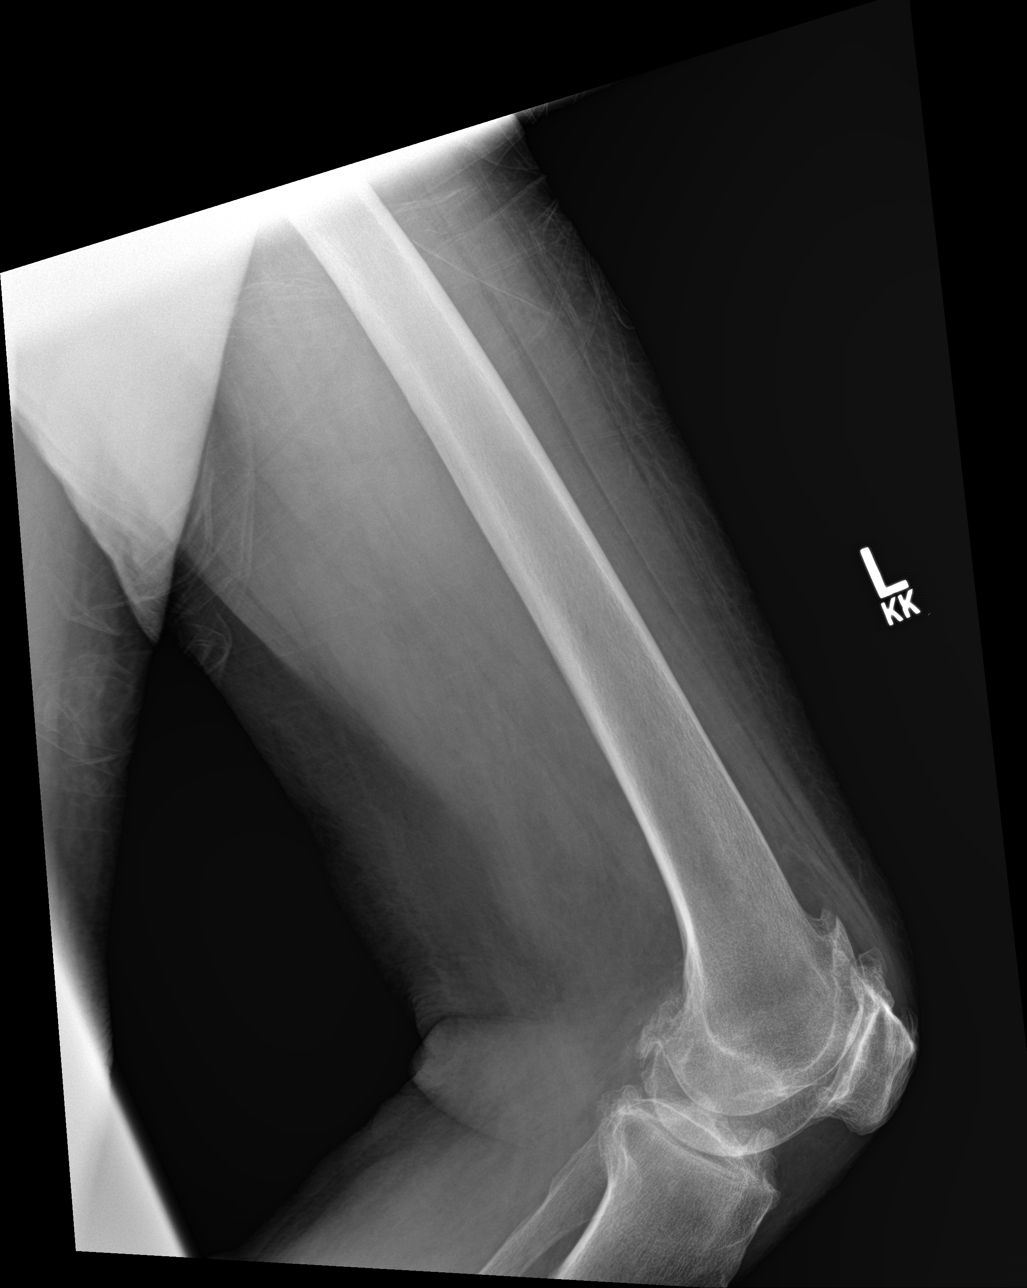

[4 of 4 positions shown; findings below may reference images not displayed]

FINDINGS: Two view exam of the left femur shows no evidence for worrisome
lytic or sclerotic osseous abnormality. Patient has substantial
tricompartmental degenerative change in the knee without metaphyseal
lesion. Uptake on the bone scan could potentially have been
degenerative.
IMPRESSION: No suspicious lytic or sclerotic osseous abnormality in the
metaphysis of the distal left femur to suggest metastatic disease.

Marked tricompartmental degenerative changes. Degenerative disease
could account for some of the uptake seen in the distal left femur.

## 2020-04-07 MED ORDER — SODIUM CHLORIDE 0.9 % IV SOLN
200.0000 mg | Freq: Once | INTRAVENOUS | Status: AC
  Administered 2020-04-07: 200 mg via INTRAVENOUS
  Filled 2020-04-07: qty 8

## 2020-04-07 MED ORDER — HEPARIN SOD (PORK) LOCK FLUSH 100 UNIT/ML IV SOLN
INTRAVENOUS | Status: AC
  Filled 2020-04-07: qty 5

## 2020-04-07 MED ORDER — HEPARIN SOD (PORK) LOCK FLUSH 100 UNIT/ML IV SOLN
500.0000 [IU] | Freq: Once | INTRAVENOUS | Status: AC | PRN
  Administered 2020-04-07: 500 [IU]
  Filled 2020-04-07: qty 5

## 2020-04-07 MED ORDER — SODIUM CHLORIDE 0.9 % IV SOLN
Freq: Once | INTRAVENOUS | Status: AC
  Filled 2020-04-07: qty 250

## 2020-04-07 NOTE — Progress Notes (Signed)
No new changes noted today 

## 2020-04-10 DIAGNOSIS — C7951 Secondary malignant neoplasm of bone: Secondary | ICD-10-CM | POA: Insufficient documentation

## 2020-04-10 DIAGNOSIS — G893 Neoplasm related pain (acute) (chronic): Secondary | ICD-10-CM | POA: Insufficient documentation

## 2020-04-11 ENCOUNTER — Telehealth: Payer: Self-pay

## 2020-04-11 NOTE — Telephone Encounter (Signed)
T/C to pt for follow up after receiving first infusion.   No answer and would not allow me to leave a message.

## 2020-04-13 ENCOUNTER — Ambulatory Visit: Payer: Medicare Other

## 2020-04-13 ENCOUNTER — Encounter: Payer: Self-pay | Admitting: Hematology and Oncology

## 2020-04-13 NOTE — Progress Notes (Signed)
The patient c/o pain noted to her groin area (8) x 1 week. The patient name and DOB has been verified by phone.

## 2020-04-13 NOTE — Progress Notes (Signed)
[facility]  [address] [place], [place] [address] Phone: [phone]  Fax: [phone]   Clinic Day:  04/14/2020  Referring physician: Philmore Pali, NP  Chief Complaint: Mariah Thornton is a 74 y.o. female with metastatic clear cell renal cell carcinoma seen for review of bone scan and initiation of axitinib.  HPI:  The patient was last seen in the medical oncology clinic on 04/07/2020.   At that time, she was doing well.  Pain was controlled with Tylenol. She received cycle #1 pembrolizumab.  Left femur films on 04/07/2020 revealed no suspicious lytic or sclerotic osseous abnormality in the metaphysis of the distal left femur to suggest metastatic disease. There were marked tricompartmental degenerative changes. Degenerative disease could account for some of the uptake seen in the distal left femur.  Lumbar spine MRI with and without contrast is scheduled for 04/22/2020.  During the interim, she has been fine. Her first treatment went well and she denies any side effects. Her legs are swollen and she has a "sinus thing going on." She is still a little bit constipated and has been drinking prune juice. She has lost her appetite a little bit. She drinks Ensure, water, and tea at home. She has been using a spray to keep her mouth moist.  Her groin and back were sore when she woke up this morning. She takes Tylenol for pain but would like something a little bit stronger.   History reviewed. No pertinent past medical history.  Past Surgical History:  Procedure Laterality Date  . PORTA CATH INSERTION N/A 03/28/2020   Procedure: PORTA CATH INSERTION;  Surgeon: Algernon Huxley, MD;  Location: [facility];  Service: Cardiovascular;  Laterality: N/A;    History reviewed. No pertinent family history.  Social History:  reports that she has never smoked. She has never used smokeless tobacco. She reports current alcohol use of about 1.0 standard drink of  alcohol per week. She reports previous drug use. reports that she has never smoked. She has never used smokeless tobacco. She reports previous alcohol use. She reports previous drug use.  The patient denies any exposure to radiation or toxins.  She states that she is retired.  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  Her husband's name is [name].  The patient lives in [place], [place]. The patient is accompanied by her friend Tye Maryland (in person) today.  Allergies: No Known Allergies  Current Medications: Current Outpatient Medications  Medication Sig Dispense Refill  . hydrOXYzine (ATARAX/VISTARIL) 25 MG tablet Take 25 mg by mouth every 8 (eight) hours as needed.    Marland Kitchen axitinib (INLYTA) 5 MG tablet Take 1 tablet (5 mg total) by mouth 2 (two) times daily. (Patient not taking: Reported on 04/04/2020) 60 tablet 0  . lidocaine-prilocaine (EMLA) cream Apply to affected area once (Patient not taking: Reported on 04/04/2020) 30 g 3  . ondansetron (ZOFRAN) 8 MG tablet Take 1 tablet (8 mg total) by mouth 2 (two) times daily as needed (Nausea or vomiting). (Patient not taking: Reported on 04/04/2020) 30 tablet 1   No current facility-administered medications for this visit.    Review of Systems  Constitutional: Negative for chills, diaphoresis, fever, malaise/fatigue and weight loss (up 2 lbs).       Feels "a little sore today".  HENT: Negative for congestion, ear discharge, ear pain, hearing loss, nosebleeds, sinus pain, sore throat and tinnitus.        "Sinus  thing going on."  Eyes: Negative for blurred vision and double vision.  Respiratory: Negative for cough, hemoptysis, sputum production and shortness of breath.   Cardiovascular: Positive for leg swelling. Negative for chest pain and palpitations.  Gastrointestinal: Positive for constipation. Negative for abdominal pain, blood in stool, diarrhea, heartburn, melena, nausea and vomiting.       Drinks Ensure. Appetite decreased.    Genitourinary: Negative for dysuria, frequency, hematuria and urgency.  Musculoskeletal: Positive for back pain (soreness) and myalgias (groin pain upon standing). Negative for joint pain and neck pain.  Skin: Negative for itching and rash.  Neurological: Negative for dizziness, tingling, sensory change, weakness and headaches.  Endo/Heme/Allergies: Does not bruise/bleed easily.  Psychiatric/Behavioral: Negative for depression and memory loss. The patient is not nervous/anxious and does not have insomnia.   All other systems reviewed and are negative.  Performance status (ECOG): 1-2  Vitals Blood pressure (!) 125/53, pulse (!) 103, temperature 98.7 F (37.1 C), temperature source Tympanic, weight 151 lb 7.3 oz (68.7 kg), SpO2 100 %.  Physical Exam Vitals and nursing note reviewed.  HENT:     Head: Normocephalic and atraumatic.     Comments: Short brown hair.    Mouth/Throat:     Mouth: Mucous membranes are moist.     Pharynx: Oropharynx is clear.     Comments: Dry mouth has improved. Eyes:     General: No scleral icterus.    Extraocular Movements: Extraocular movements intact.     Conjunctiva/sclera: Conjunctivae normal.     Pupils: Pupils are equal, round, and reactive to light.     Comments: Blue eyes.  Cardiovascular:     Rate and Rhythm: Normal rate and regular rhythm.     Heart sounds: Normal heart sounds. No murmur heard.   Pulmonary:     Effort: Pulmonary effort is normal. No respiratory distress.     Breath sounds: Normal breath sounds. No wheezing or rales.  Abdominal:     General: Bowel sounds are normal.     Palpations: There is no mass.     Tenderness: There is no abdominal tenderness.  Musculoskeletal:        General: Tenderness (back) present. No swelling. Normal range of motion.     Cervical back: Normal range of motion and neck supple.     Right lower leg: Edema present.     Left lower leg: Edema present.     Comments: 8.5 x 5.5 cm mass on center of  upper back.  Skin:    General: Skin is warm and dry.     Comments: Port well healed.  Neurological:     Mental Status: She is alert and oriented to person, place, and time. Mental status is at baseline.  Psychiatric:        Behavior: Behavior normal.        Thought Content: Thought content normal.        Judgment: Judgment normal.      03/16/2020     04/04/2020    Appointment on 04/14/2020  Component Date Value Ref Range Status  . Sodium 04/14/2020 134* 135 - 145 mmol/L Final  . Potassium 04/14/2020 4.8  3.5 - 5.1 mmol/L Final  . Chloride 04/14/2020 106  98 - 111 mmol/L Final  . CO2 04/14/2020 19* 22 - 32 mmol/L Final  . Glucose, Bld 04/14/2020 128* 70 - 99 mg/dL Final   Glucose reference range applies only to samples taken after fasting for at least 8 hours.  Marland Kitchen  BUN 04/14/2020 23  8 - 23 mg/dL Final  . Creatinine, Ser 04/14/2020 1.12* 0.44 - 1.00 mg/dL Final  . Calcium 04/14/2020 8.2* 8.9 - 10.3 mg/dL Final  . Total Protein 04/14/2020 7.1  6.5 - 8.1 g/dL Final  . Albumin 04/14/2020 2.2* 3.5 - 5.0 g/dL Final  . AST 04/14/2020 24  15 - 41 U/L Final  . ALT 04/14/2020 9  0 - 44 U/L Final  . Alkaline Phosphatase 04/14/2020 96  38 - 126 U/L Final  . Total Bilirubin 04/14/2020 0.7  0.3 - 1.2 mg/dL Final  . GFR calc non Af Amer 04/14/2020 48* >60 mL/min Final  . Anion gap 04/14/2020 9  5 - 15 Final   Performed at [facility], [address], [place], [place] [address]  . WBC 04/14/2020 6.0  4.0 - 10.5 K/uL Final  . RBC 04/14/2020 3.41* 3.87 - 5.11 MIL/uL Final  . Hemoglobin 04/14/2020 8.5* 12.0 - 15.0 g/dL Final  . HCT 04/14/2020 29.2* 36 - 46 % Final  . MCV 04/14/2020 85.6  80.0 - 100.0 fL Final  . MCH 04/14/2020 24.9* 26.0 - 34.0 pg Final  . MCHC 04/14/2020 29.1* 30.0 - 36.0 g/dL Final  . RDW 04/14/2020 17.9* 11.5 - 15.5 % Final  . Platelets 04/14/2020 197  150 - 400 K/uL Final  . nRBC 04/14/2020 0.0  0.0 - 0.2 % Final  . Neutrophils Relative % 04/14/2020 75   % Final  . Neutro Abs 04/14/2020 4.6  1.7 - 7.7 K/uL Final  . Lymphocytes Relative 04/14/2020 11  % Final  . Lymphs Abs 04/14/2020 0.7  0.7 - 4.0 K/uL Final  . Monocytes Relative 04/14/2020 8  % Final  . Monocytes Absolute 04/14/2020 0.5  0.1 - 1.0 K/uL Final  . Eosinophils Relative 04/14/2020 4  % Final  . Eosinophils Absolute 04/14/2020 0.3  0 - 0 K/uL Final  . Basophils Relative 04/14/2020 1  % Final  . Basophils Absolute 04/14/2020 0.0  0 - 0 K/uL Final  . Immature Granulocytes 04/14/2020 1  % Final  . Abs Immature Granulocytes 04/14/2020 0.03  0.00 - 0.07 K/uL Final   Performed at [facility], [address], [place], [place] [address]    Assessment:  ROSHELLE TRAUB is a 74 y.o. female with metastatic renal cell carcinoma s/p CT guided biopsy of a 5.6 x 4.0 cm soft tissue mass in the posterior neck on 03/08/2020.  Pathology revealed metastatic carcinoma with clear cell and oncocytic features.  Cytokeratin 7 was negative, cytokeratin 20, focally positive, PAX 8 positive, GATA3 negative and p63 negative.  Based on the pattern of immunoreactivity, metastatic carcinoma is compatible with metastatic renal cell carcinoma.  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 She presented with hypercalcemia.   LDH was 245 on 03/05/2020.  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  There was a possible lytic lesion within the L2 vertebral body.  Abdomen and pelvis CT on 03/04/2020 revealed an 8.8 x 8.5 x 7.5 cm heterogeneous mass arising from the anterior right kidney.  There were numerous and large retroperitoneal lymph nodes.  There are multiple lytic osseous lesions involving the L2 and L3 vertebral bodies with clear cortical and medullary destruction.  There are multiple additional subtle osseous lucencies throughout the spine and  pelvis were suspicious for subtle metastatic disease.  There is a small right and  trace left pleural effusion and multiple bilateral pulmonary nodules.  There was stigmata of cirrhosis and splenomegaly with trace ascites.  Bone scan on 04/05/2020 revealed foci of abnormal tracer uptake at the inferior RIGHT scapula, L1 and L2 vertebral bodies, and distal LEFT femoral metaphysis c/w osseous metastases.  Left femur films on 04/07/2020 revealed no suspicious lytic or sclerotic osseous abnormality in the metaphysis of the distal left femur .  She began cycle #1 pembrolizumab on 04/07/2020.  She has a history of hypercalcemia.   She received Zometa on 03/04/2020 and 04/04/2020.  Echo on 04/06/2020 revealed an EF of 70-75%.  She has folate deficiency.  Folate was 4.8 on 03/06/2020.  She is on oral folic acid.  B12 was 2351 on 03/06/2020.  She received the [place] COVID-19 vaccine on 11/11/2019 and 12/08/2019.  Symptomatically, she feels fine. Appetite has decreased.  She drinks Ensure, water, and tea at home. Groin and back are sore.  Exam reveals a smaller upper neck mass.  Plan: 1.Labs today:  CBC with diff, CMP. 2.  Metastatic clear cell renal cell carcinoma She has a large right renal mass, multiple lytic bone lesions, and a mass on her posterior neck.  She is in a poor prognostic group based on [facility] criteria (metastatic disease, KPS < 80%, hypercalcemia, elevated LDH, and anemia). Patient has bone metastasis in the right scapula, L1 and L2 and distal left femoral metaphysis.  Review plan for addition of axitinib (Inlyta) to pembrolizumab based on the KEYNOTE-426 trial.   Pembrolizumab began 04/07/2020.  Begin axitinib tomorrow.   Potential side effects reviewed.   Patient consented to treatment.  Lumbar spine MRI on 04/22/2020. 3. Hypercalcemia Patient has hypercalcemia associated with malignancy. patient receives Zometa monthly (last  04/04/2020). 4. Normocytic anemia Hematocrit 29.2.  Hemoglobin 8.5.  MCV 85.6 today.  Etiology likely secondary to renal cell carcinoma. Patient denies any bleeding. Ferritin 660 with an iron saturation of 11% and a TIBC of 150 on 03/06/2020.             Folate 4.8 (low) with a B12 of 2351.   She is on folic acid. 5.   Cancer-related pain  Patient notes a sore back and groin area.  Lumbar spine MRI scheduled on 04/22/2020.  She would like something stronger than Tylenol.  Discuss hydrocodone with Tylenol or Tramadol.  Rx: hydrocodone/acetaminophen 5/325 mg 1/2 - 1 tablet every 6 hours prn pain.  She may be a candidate for future local radiation. 6.   Rx: Lortab (dis: #30). 7.   RN:  Handicapped permit. 8.   RTC in 1 week for MD assessment and labs (CBC with diff, CMP).  I discussed the assessment and treatment plan with the patient.  The patient was provided an opportunity to ask questions and all were answered.  The patient agreed with the plan and demonstrated an understanding of the instructions.  The patient was advised to call back if the symptoms worsen or if the condition fails to improve as anticipated.  I provided 25 minutes of face-to-face time during this this encounter and > 50% was spent counseling as documented under my assessment and plan.   Kourtnee Lahey C. Mike Gip, MD, PhD    04/14/2020, 10:26 AM  I, Mirian Mo Tufford, am acting as Education administrator for [employer]. Mike Gip, MD, PhD.  I, Hannahgrace Lalli C. Mike Gip, MD, have reviewed the above documentation for accuracy and completeness, and I agree with the above.

## 2020-04-14 ENCOUNTER — Inpatient Hospital Stay: Payer: Medicare Other

## 2020-04-14 ENCOUNTER — Other Ambulatory Visit: Payer: Self-pay

## 2020-04-14 ENCOUNTER — Encounter: Payer: Self-pay | Admitting: Hematology and Oncology

## 2020-04-14 ENCOUNTER — Inpatient Hospital Stay: Payer: Medicare Other | Attending: Hematology and Oncology | Admitting: Hematology and Oncology

## 2020-04-14 VITALS — BP 125/53 | HR 103 | Temp 98.7°F | Wt 151.5 lb

## 2020-04-14 DIAGNOSIS — C641 Malignant neoplasm of right kidney, except renal pelvis: Secondary | ICD-10-CM | POA: Diagnosis present

## 2020-04-14 DIAGNOSIS — Z7982 Long term (current) use of aspirin: Secondary | ICD-10-CM | POA: Diagnosis not present

## 2020-04-14 DIAGNOSIS — E538 Deficiency of other specified B group vitamins: Secondary | ICD-10-CM

## 2020-04-14 DIAGNOSIS — C7802 Secondary malignant neoplasm of left lung: Secondary | ICD-10-CM | POA: Diagnosis not present

## 2020-04-14 DIAGNOSIS — I119 Hypertensive heart disease without heart failure: Secondary | ICD-10-CM | POA: Diagnosis not present

## 2020-04-14 DIAGNOSIS — Z7902 Long term (current) use of antithrombotics/antiplatelets: Secondary | ICD-10-CM | POA: Diagnosis not present

## 2020-04-14 DIAGNOSIS — G893 Neoplasm related pain (acute) (chronic): Secondary | ICD-10-CM

## 2020-04-14 DIAGNOSIS — E46 Unspecified protein-calorie malnutrition: Secondary | ICD-10-CM | POA: Insufficient documentation

## 2020-04-14 DIAGNOSIS — C7951 Secondary malignant neoplasm of bone: Secondary | ICD-10-CM | POA: Diagnosis not present

## 2020-04-14 DIAGNOSIS — C7801 Secondary malignant neoplasm of right lung: Secondary | ICD-10-CM | POA: Diagnosis not present

## 2020-04-14 DIAGNOSIS — I639 Cerebral infarction, unspecified: Secondary | ICD-10-CM | POA: Diagnosis not present

## 2020-04-14 DIAGNOSIS — Z79899 Other long term (current) drug therapy: Secondary | ICD-10-CM | POA: Diagnosis not present

## 2020-04-14 DIAGNOSIS — D649 Anemia, unspecified: Secondary | ICD-10-CM

## 2020-04-14 DIAGNOSIS — C7931 Secondary malignant neoplasm of brain: Secondary | ICD-10-CM | POA: Diagnosis not present

## 2020-04-14 LAB — COMPREHENSIVE METABOLIC PANEL
ALT: 9 U/L (ref 0–44)
AST: 24 U/L (ref 15–41)
Albumin: 2.2 g/dL — ABNORMAL LOW (ref 3.5–5.0)
Alkaline Phosphatase: 96 U/L (ref 38–126)
Anion gap: 9 (ref 5–15)
BUN: 23 mg/dL (ref 8–23)
CO2: 19 mmol/L — ABNORMAL LOW (ref 22–32)
Calcium: 8.2 mg/dL — ABNORMAL LOW (ref 8.9–10.3)
Chloride: 106 mmol/L (ref 98–111)
Creatinine, Ser: 1.12 mg/dL — ABNORMAL HIGH (ref 0.44–1.00)
GFR calc non Af Amer: 48 mL/min — ABNORMAL LOW (ref >60)
Glucose, Bld: 128 mg/dL — ABNORMAL HIGH (ref 70–99)
Potassium: 4.8 mmol/L (ref 3.5–5.1)
Sodium: 134 mmol/L — ABNORMAL LOW (ref 135–145)
Total Bilirubin: 0.7 mg/dL (ref 0.3–1.2)
Total Protein: 7.1 g/dL (ref 6.5–8.1)

## 2020-04-14 LAB — CBC WITH DIFFERENTIAL/PLATELET
Abs Immature Granulocytes: 0.03 10*3/uL (ref 0.00–0.07)
Basophils Absolute: 0 10*3/uL (ref 0.0–0.1)
Basophils Relative: 1 %
Eosinophils Absolute: 0.3 10*3/uL (ref 0.0–0.5)
Eosinophils Relative: 4 %
HCT: 29.2 % — ABNORMAL LOW (ref 36.0–46.0)
Hemoglobin: 8.5 g/dL — ABNORMAL LOW (ref 12.0–15.0)
Immature Granulocytes: 1 %
Lymphocytes Relative: 11 %
Lymphs Abs: 0.7 10*3/uL (ref 0.7–4.0)
MCH: 24.9 pg — ABNORMAL LOW (ref 26.0–34.0)
MCHC: 29.1 g/dL — ABNORMAL LOW (ref 30.0–36.0)
MCV: 85.6 fL (ref 80.0–100.0)
Monocytes Absolute: 0.5 10*3/uL (ref 0.1–1.0)
Monocytes Relative: 8 %
Neutro Abs: 4.6 10*3/uL (ref 1.7–7.7)
Neutrophils Relative %: 75 %
Platelets: 197 10*3/uL (ref 150–400)
RBC: 3.41 MIL/uL — ABNORMAL LOW (ref 3.87–5.11)
RDW: 17.9 % — ABNORMAL HIGH (ref 11.5–15.5)
WBC: 6 10*3/uL (ref 4.0–10.5)
nRBC: 0 % (ref 0.0–0.2)

## 2020-04-14 MED ORDER — HYDROCODONE-ACETAMINOPHEN 5-325 MG PO TABS
ORAL_TABLET | ORAL | 0 refills | Status: AC
Start: 2020-04-14 — End: ?

## 2020-04-14 NOTE — Patient Instructions (Signed)
  Start the axitinib tomorrow morning.  Encourage fluids.  Watch for constipation with pain medications.

## 2020-04-19 ENCOUNTER — Telehealth: Payer: Self-pay | Admitting: *Deleted

## 2020-04-19 NOTE — Telephone Encounter (Signed)
Tye Maryland called reporting that the pain medicine patient is on is too strong as all patient wants to do is sleep all the time and she is not eating or drinking due to this. Please asvise

## 2020-04-20 ENCOUNTER — Encounter: Payer: Self-pay | Admitting: Hematology and Oncology

## 2020-04-20 ENCOUNTER — Inpatient Hospital Stay
Admission: EM | Admit: 2020-04-20 | Discharge: 2020-04-23 | DRG: 065 | Disposition: A | Discharge status: Home Health | Payer: Medicare Other | Attending: Internal Medicine | Admitting: Internal Medicine

## 2020-04-20 ENCOUNTER — Other Ambulatory Visit: Payer: Self-pay

## 2020-04-20 ENCOUNTER — Ambulatory Visit (HOSPITAL_BASED_OUTPATIENT_CLINIC_OR_DEPARTMENT_OTHER): Payer: Medicare Other | Admitting: Hematology and Oncology

## 2020-04-20 ENCOUNTER — Emergency Department: Payer: Medicare Other

## 2020-04-20 ENCOUNTER — Inpatient Hospital Stay: Payer: Medicare Other

## 2020-04-20 ENCOUNTER — Encounter: Payer: Self-pay | Admitting: *Deleted

## 2020-04-20 VITALS — BP 188/73 | Temp 98.6°F | Resp 18 | Ht 62.0 in | Wt 151.7 lb

## 2020-04-20 DIAGNOSIS — E875 Hyperkalemia: Secondary | ICD-10-CM | POA: Diagnosis present

## 2020-04-20 DIAGNOSIS — R400 Somnolence: Secondary | ICD-10-CM

## 2020-04-20 DIAGNOSIS — G893 Neoplasm related pain (acute) (chronic): Secondary | ICD-10-CM | POA: Diagnosis not present

## 2020-04-20 DIAGNOSIS — D649 Anemia, unspecified: Secondary | ICD-10-CM

## 2020-04-20 DIAGNOSIS — M8448XA Pathological fracture, other site, initial encounter for fracture: Secondary | ICD-10-CM | POA: Diagnosis present

## 2020-04-20 DIAGNOSIS — E871 Hypo-osmolality and hyponatremia: Secondary | ICD-10-CM | POA: Diagnosis present

## 2020-04-20 DIAGNOSIS — M545 Low back pain, unspecified: Secondary | ICD-10-CM

## 2020-04-20 DIAGNOSIS — I10 Essential (primary) hypertension: Secondary | ICD-10-CM | POA: Diagnosis present

## 2020-04-20 DIAGNOSIS — R9401 Abnormal electroencephalogram [EEG]: Secondary | ICD-10-CM | POA: Diagnosis present

## 2020-04-20 DIAGNOSIS — E860 Dehydration: Secondary | ICD-10-CM | POA: Diagnosis present

## 2020-04-20 DIAGNOSIS — E538 Deficiency of other specified B group vitamins: Secondary | ICD-10-CM

## 2020-04-20 DIAGNOSIS — Z515 Encounter for palliative care: Secondary | ICD-10-CM

## 2020-04-20 DIAGNOSIS — C641 Malignant neoplasm of right kidney, except renal pelvis: Secondary | ICD-10-CM

## 2020-04-20 DIAGNOSIS — R4182 Altered mental status, unspecified: Secondary | ICD-10-CM | POA: Diagnosis not present

## 2020-04-20 DIAGNOSIS — Z79899 Other long term (current) drug therapy: Secondary | ICD-10-CM

## 2020-04-20 DIAGNOSIS — R7989 Other specified abnormal findings of blood chemistry: Secondary | ICD-10-CM | POA: Diagnosis present

## 2020-04-20 DIAGNOSIS — R600 Localized edema: Secondary | ICD-10-CM

## 2020-04-20 DIAGNOSIS — Z66 Do not resuscitate: Secondary | ICD-10-CM | POA: Diagnosis present

## 2020-04-20 DIAGNOSIS — R590 Localized enlarged lymph nodes: Secondary | ICD-10-CM | POA: Diagnosis present

## 2020-04-20 DIAGNOSIS — C7951 Secondary malignant neoplasm of bone: Secondary | ICD-10-CM | POA: Diagnosis present

## 2020-04-20 DIAGNOSIS — Z20822 Contact with and (suspected) exposure to covid-19: Secondary | ICD-10-CM | POA: Diagnosis present

## 2020-04-20 DIAGNOSIS — I639 Cerebral infarction, unspecified: Secondary | ICD-10-CM

## 2020-04-20 DIAGNOSIS — C7800 Secondary malignant neoplasm of unspecified lung: Secondary | ICD-10-CM | POA: Diagnosis present

## 2020-04-20 DIAGNOSIS — I63432 Cerebral infarction due to embolism of left posterior cerebral artery: Secondary | ICD-10-CM | POA: Diagnosis not present

## 2020-04-20 DIAGNOSIS — R5383 Other fatigue: Secondary | ICD-10-CM

## 2020-04-20 LAB — CBC
HCT: 31.5 % — ABNORMAL LOW (ref 36.0–46.0)
Hemoglobin: 9.6 g/dL — ABNORMAL LOW (ref 12.0–15.0)
MCH: 25.1 pg — ABNORMAL LOW (ref 26.0–34.0)
MCHC: 30.5 g/dL (ref 30.0–36.0)
MCV: 82.2 fL (ref 80.0–100.0)
Platelets: 158 10*3/uL (ref 150–400)
RBC: 3.83 MIL/uL — ABNORMAL LOW (ref 3.87–5.11)
RDW: 18.3 % — ABNORMAL HIGH (ref 11.5–15.5)
WBC: 9 10*3/uL (ref 4.0–10.5)
nRBC: 0 % (ref 0.0–0.2)

## 2020-04-20 LAB — COMPREHENSIVE METABOLIC PANEL
ALT: 12 U/L (ref 0–44)
ALT: 13 U/L (ref 0–44)
AST: 56 U/L — ABNORMAL HIGH (ref 15–41)
AST: 58 U/L — ABNORMAL HIGH (ref 15–41)
Albumin: 2 g/dL — ABNORMAL LOW (ref 3.5–5.0)
Albumin: 2.2 g/dL — ABNORMAL LOW (ref 3.5–5.0)
Alkaline Phosphatase: 118 U/L (ref 38–126)
Alkaline Phosphatase: 127 U/L — ABNORMAL HIGH (ref 38–126)
Anion gap: 8 (ref 5–15)
Anion gap: 10 (ref 5–15)
BUN: 22 mg/dL (ref 8–23)
BUN: 21 mg/dL (ref 8–23)
CO2: 20 mmol/L — ABNORMAL LOW (ref 22–32)
CO2: 20 mmol/L — ABNORMAL LOW (ref 22–32)
Calcium: 7.8 mg/dL — ABNORMAL LOW (ref 8.9–10.3)
Calcium: 8.2 mg/dL — ABNORMAL LOW (ref 8.9–10.3)
Chloride: 101 mmol/L (ref 98–111)
Chloride: 102 mmol/L (ref 98–111)
Creatinine, Ser: 0.91 mg/dL (ref 0.44–1.00)
Creatinine, Ser: 0.85 mg/dL (ref 0.44–1.00)
GFR, Estimated: >60 mL/min (ref >60)
GFR, Estimated: >60 mL/min (ref >60)
Glucose, Bld: 137 mg/dL — ABNORMAL HIGH (ref 70–99)
Glucose, Bld: 114 mg/dL — ABNORMAL HIGH (ref 70–99)
Potassium: 5.5 mmol/L — ABNORMAL HIGH (ref 3.5–5.1)
Potassium: 5.3 mmol/L — ABNORMAL HIGH (ref 3.5–5.1)
Sodium: 129 mmol/L — ABNORMAL LOW (ref 135–145)
Sodium: 132 mmol/L — ABNORMAL LOW (ref 135–145)
Total Bilirubin: 0.8 mg/dL (ref 0.3–1.2)
Total Bilirubin: 1.2 mg/dL (ref 0.3–1.2)
Total Protein: 6.7 g/dL (ref 6.5–8.1)
Total Protein: 7 g/dL (ref 6.5–8.1)

## 2020-04-20 LAB — CBC WITH DIFFERENTIAL/PLATELET
Abs Immature Granulocytes: 0.21 10*3/uL — ABNORMAL HIGH (ref 0.00–0.07)
Basophils Absolute: 0 10*3/uL (ref 0.0–0.1)
Basophils Relative: 1 %
Eosinophils Absolute: 0.3 10*3/uL (ref 0.0–0.5)
Eosinophils Relative: 3 %
HCT: 30.5 % — ABNORMAL LOW (ref 36.0–46.0)
Hemoglobin: 9.2 g/dL — ABNORMAL LOW (ref 12.0–15.0)
Immature Granulocytes: 3 %
Lymphocytes Relative: 12 %
Lymphs Abs: 1 10*3/uL (ref 0.7–4.0)
MCH: 25 pg — ABNORMAL LOW (ref 26.0–34.0)
MCHC: 30.2 g/dL (ref 30.0–36.0)
MCV: 82.9 fL (ref 80.0–100.0)
Monocytes Absolute: 0.9 10*3/uL (ref 0.1–1.0)
Monocytes Relative: 11 %
Neutro Abs: 5.7 10*3/uL (ref 1.7–7.7)
Neutrophils Relative %: 70 %
Platelets: 142 10*3/uL — ABNORMAL LOW (ref 150–400)
RBC: 3.68 MIL/uL — ABNORMAL LOW (ref 3.87–5.11)
RDW: 18.6 % — ABNORMAL HIGH (ref 11.5–15.5)
WBC: 8.1 10*3/uL (ref 4.0–10.5)
nRBC: 0 % (ref 0.0–0.2)

## 2020-04-20 LAB — LIPASE, BLOOD: Lipase: 26 U/L (ref 11–51)

## 2020-04-20 IMAGING — CT CT HEAD W/O CM
4 series · 16 of 47 positions shown, 18 images · non-contrast
Comparison: [DATE]

CLINICAL DATA: Mental status changes, history of renal cell
carcinoma

EXAM:
CT HEAD WITHOUT CONTRAST
TECHNIQUE: Contiguous axial images were obtained from the base of the skull
through the vertex without intravenous contrast.

[Series 2: head wo · axial · 0.47mm/px · z∈[-156,-46]mm · 7 of 30 slices shown, 9 images]
[im 4/30  brain]
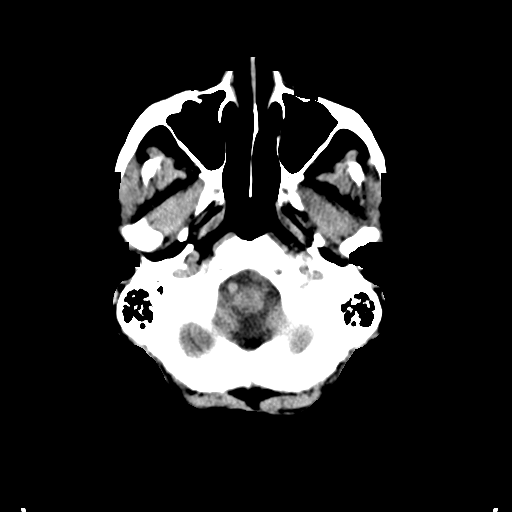
[im 4/30  bone]
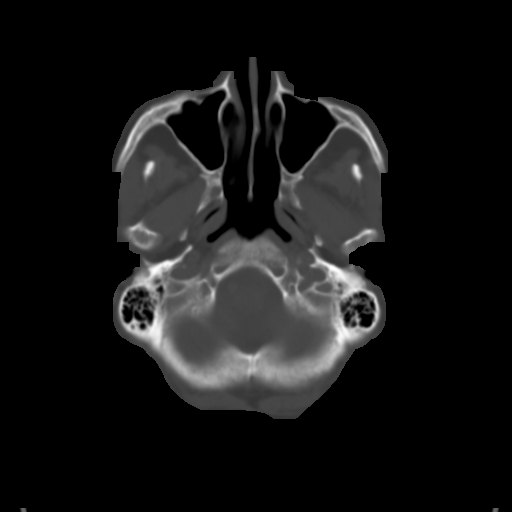
[im 8/30  brain]
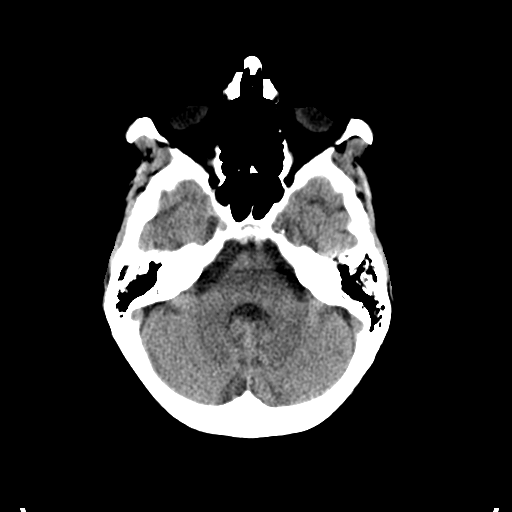
[im 11/30  brain]
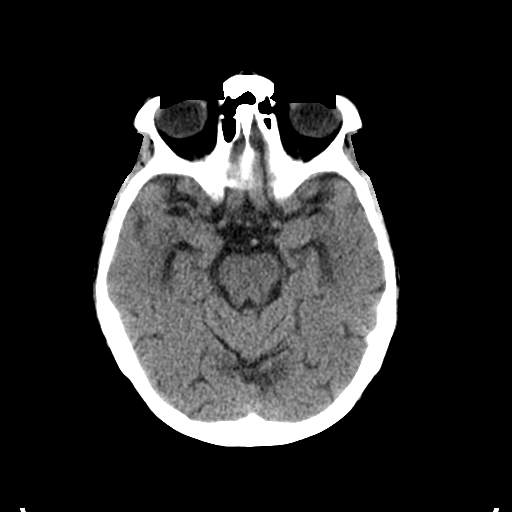
[im 15/30  brain]
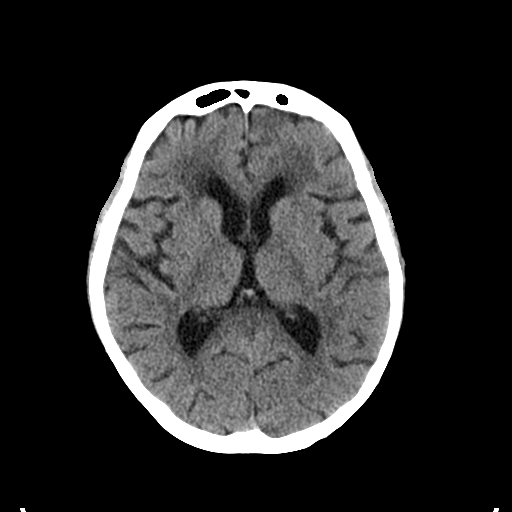
[im 19/30  brain]
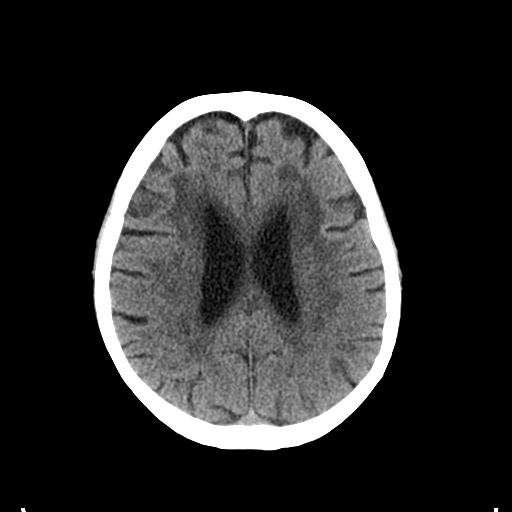
[im 19/30  bone]
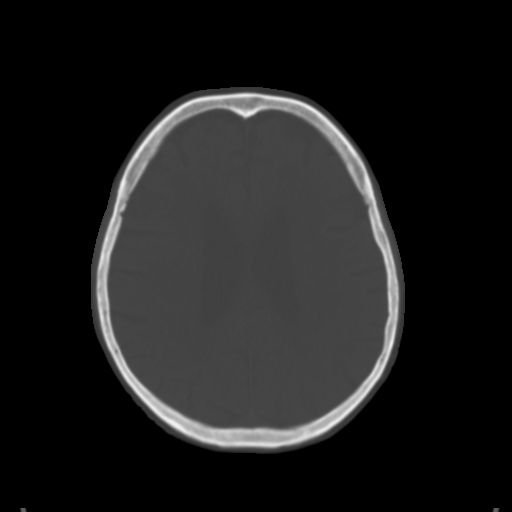
[im 22/30  brain]
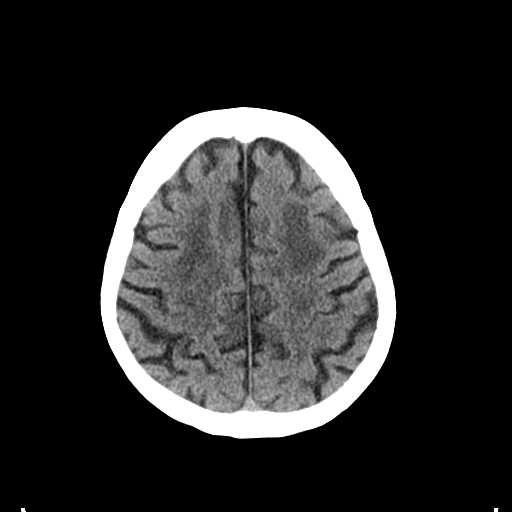
[im 26/30  brain]
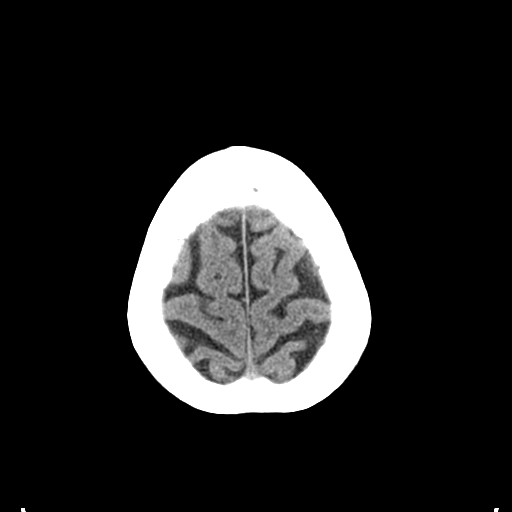

[Series 3: head bone · axial · 0.47mm/px · z∈[-157,-129]mm · 3 of 74 slices shown]
[im 8/74  bone]
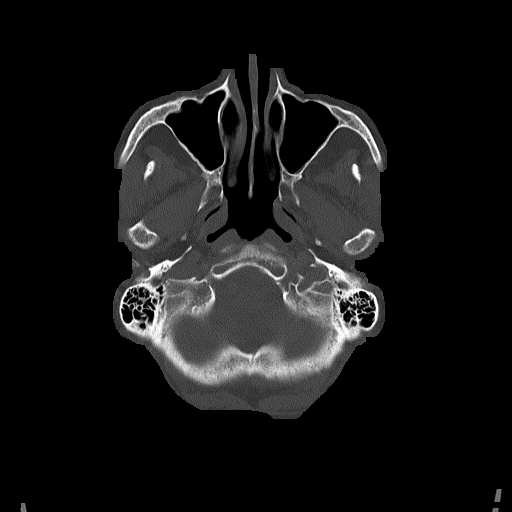
[im 15/74  bone]
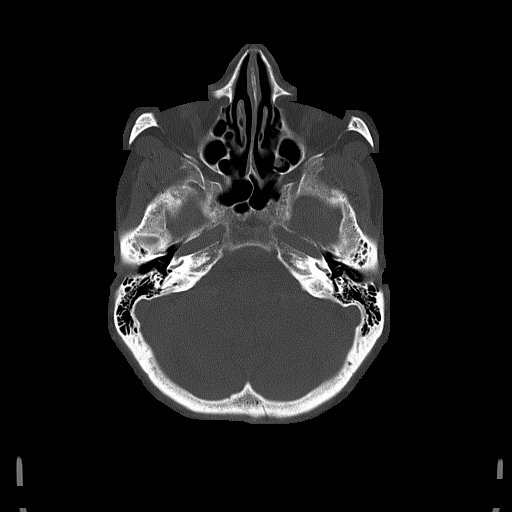
[im 22/74  bone]
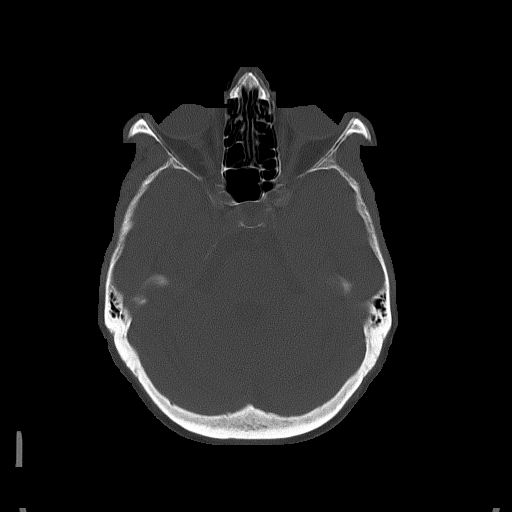

[Series 4: coronal soft tissue · coronal · 0.29mm/px · 3 of 66 slices shown]
[im 22/66  brain]
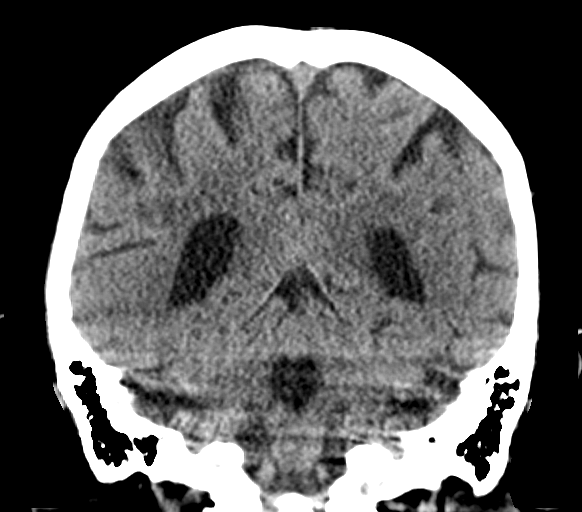
[im 29/66  brain]
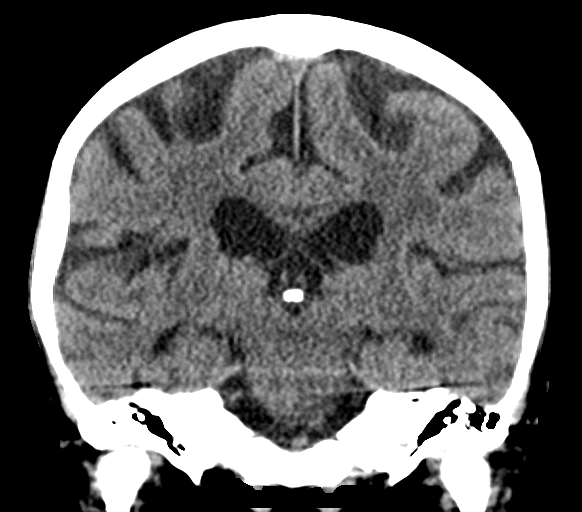
[im 37/66  brain]
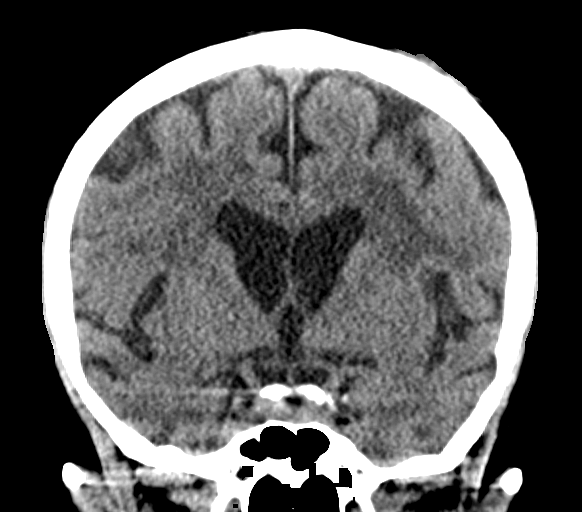

[Series 5: sagittal soft tissue · sagittal · 0.29mm/px · 3 of 56 slices shown]
[im 19/56  brain]
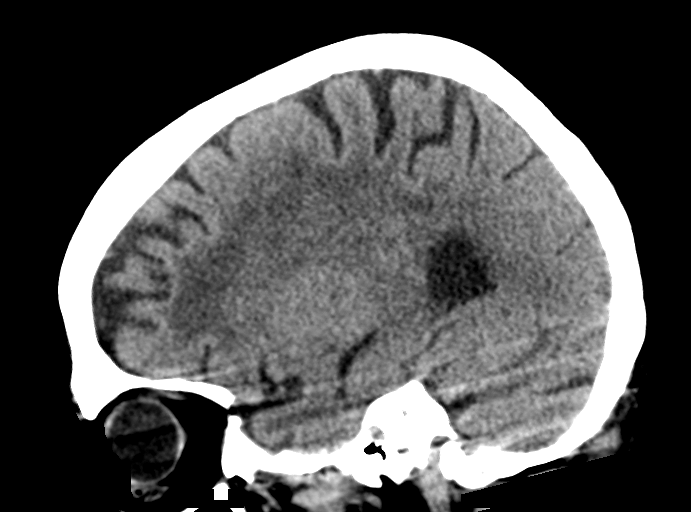
[im 28/56  brain]
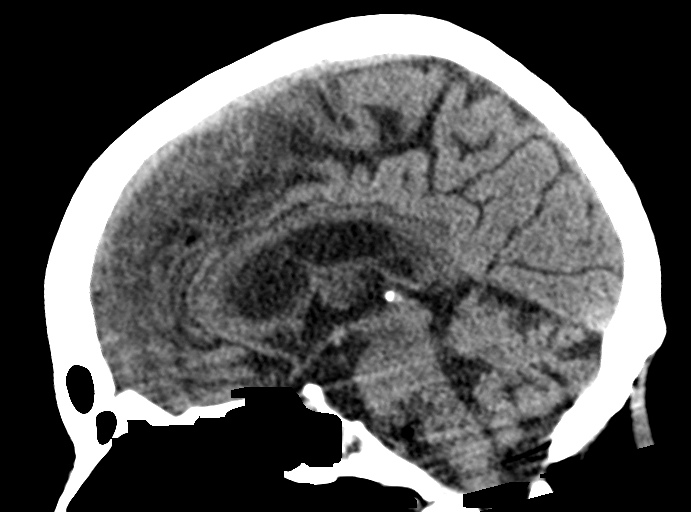
[im 37/56  brain]
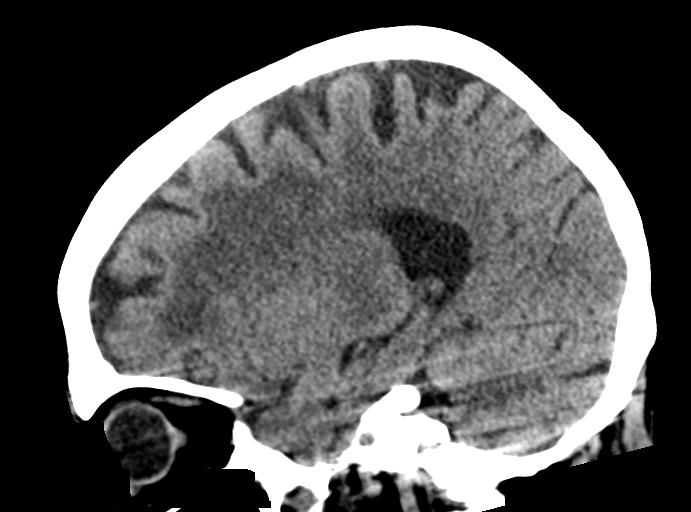

[16 of 47 positions shown; findings below may reference images not displayed]

FINDINGS: Brain: Mild atrophic changes and chronic white matter ischemic
changes are seen stable from the prior exam. No findings to suggest
acute hemorrhage, acute infarction or space-occupying mass lesion
are noted.

Vascular: No hyperdense vessel or unexpected calcification.

Skull: Normal. Negative for fracture or focal lesion.

Sinuses/Orbits: No acute finding.

Other: Scalp soft tissue nodule is noted on the left near the vertex
stable from the prior exam.
IMPRESSION: Chronic atrophic and ischemic changes without acute abnormality.

Stable soft tissue scalp lesion near the vertex on the left. Smaller
nodule is noted in the midline anteriorly.

## 2020-04-20 IMAGING — CR DG CHEST 2V
1 series · 2 of 2 positions shown · non-contrast
Comparison: Chest CT [DATE] and earlier.

CLINICAL DATA: 74-year-old female with weakness. Bilateral lower
extremity swelling. Metastatic renal cell carcinoma.

EXAM:
CHEST - 2 VIEW

[Series 1: dg chest 2 view · 0.14mm/px · 2 of 2 slices shown]
[im 1/2]
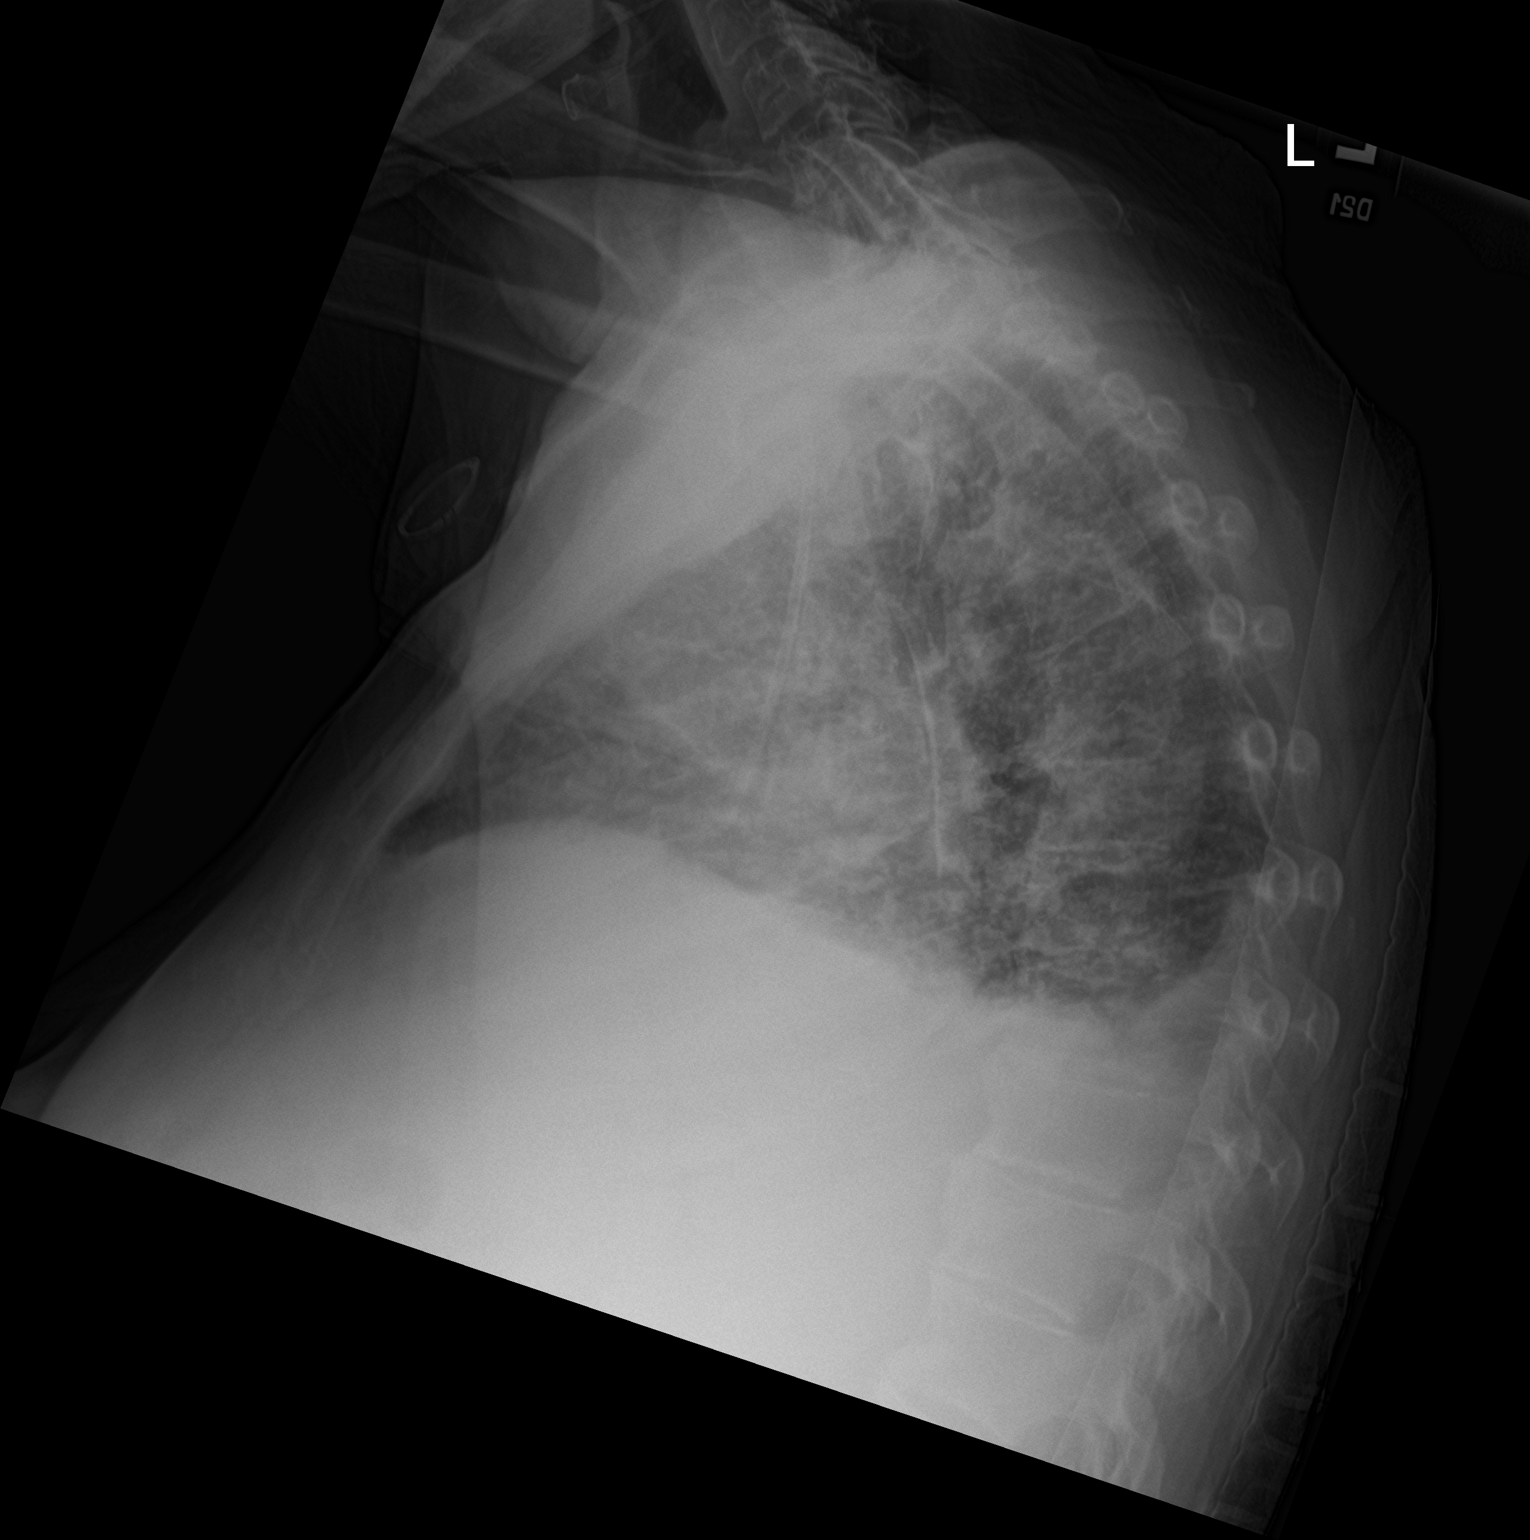
[im 2/2]
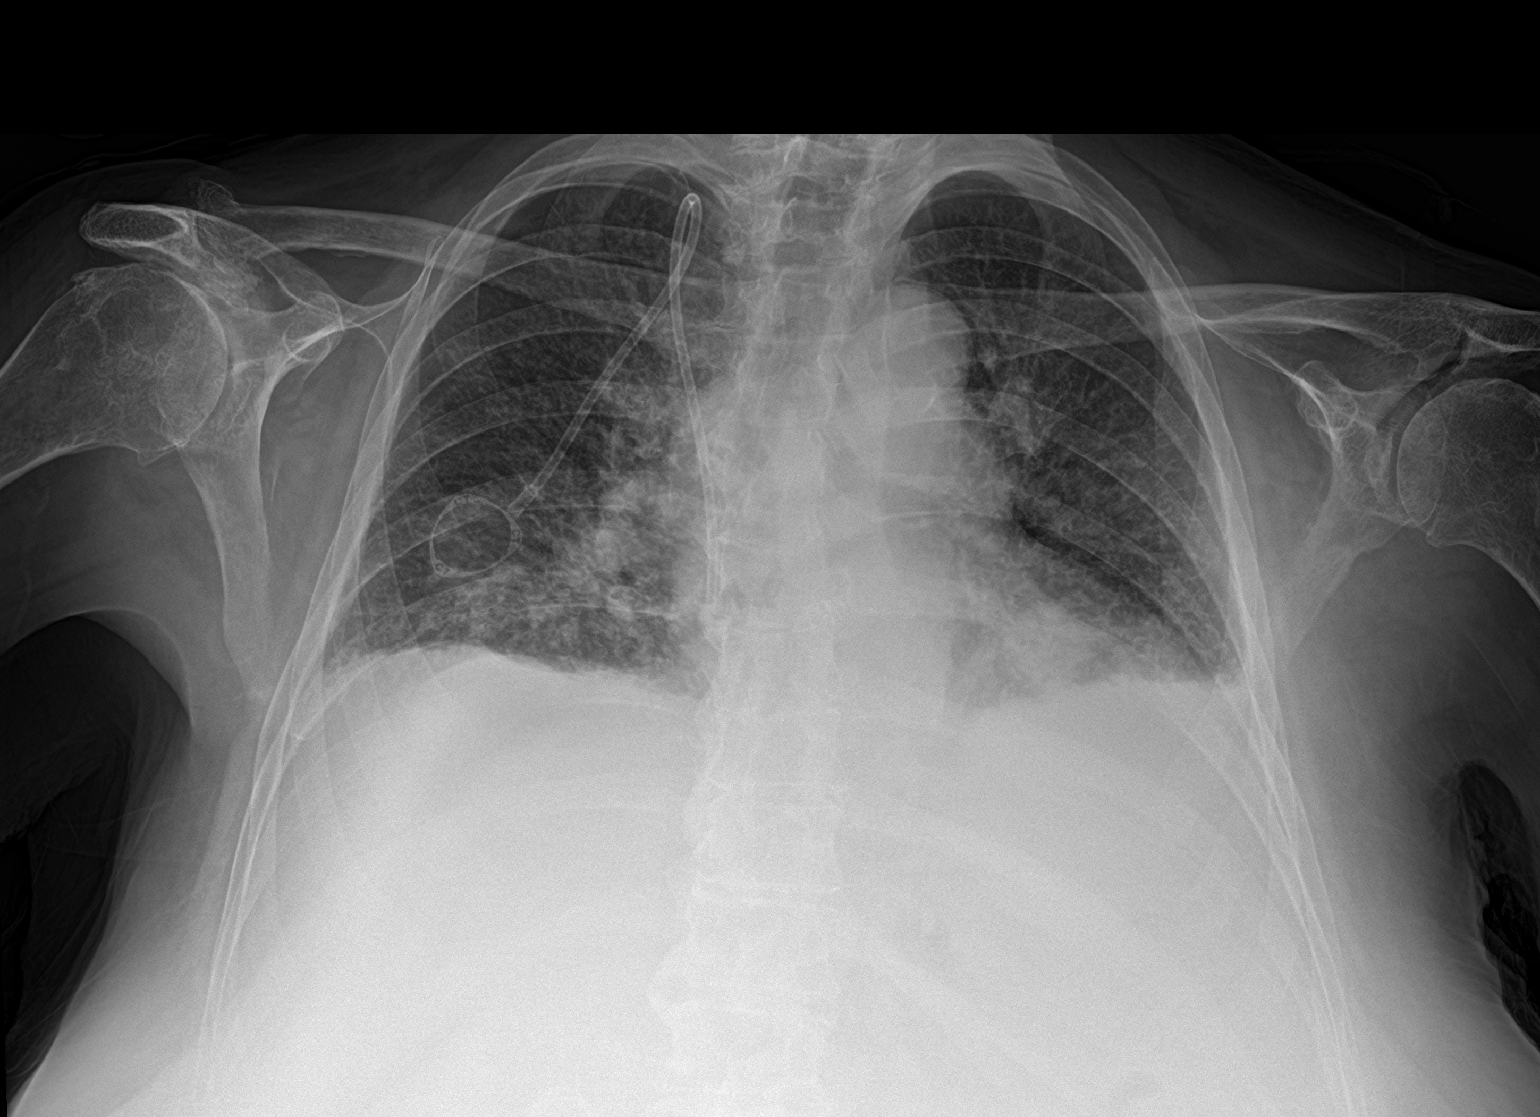

[2 of 2 positions shown; findings below may reference images not displayed]

FINDINGS: Lower lung volumes. Right chest power port. Stable mediastinal
contours.

Small bilateral pleural effusions. Increased bilateral pulmonary
interstitial opacity, with nodular pulmonary metastasis most visible
at the left lung base.

Paucity of bowel gas in the upper abdomen. Degenerative osseous
changes. No destructive osseous lesion identified radiographically.
IMPRESSION: 1. Pulmonary metastatic disease re-demonstrated with new small
pleural effusions since [DATE]. Paucity of bowel gas in the upper abdomen.  Query ascites.

## 2020-04-20 MED ORDER — FUROSEMIDE 10 MG/ML IJ SOLN
40.0000 mg | Freq: Once | INTRAMUSCULAR | Status: AC
  Administered 2020-04-20: 40 mg via INTRAVENOUS
  Filled 2020-04-20: qty 4

## 2020-04-20 MED ORDER — MORPHINE SULFATE (PF) 4 MG/ML IV SOLN
4.0000 mg | Freq: Once | INTRAVENOUS | Status: AC
  Administered 2020-04-20: 4 mg via INTRAVENOUS
  Filled 2020-04-20: qty 1

## 2020-04-20 MED ORDER — TRAMADOL HCL 50 MG PO TABS: 25.0000 mg | ORAL_TABLET | Freq: Four times a day (QID) | ORAL | 0 refills | Status: DC | PRN

## 2020-04-20 NOTE — Progress Notes (Signed)
Mariah Thornton report the patient has been taking 1 whole pill every 6 hours, She reports she is getting the pain medication 8AM, 2 pm, 8 pm and 2 AM and the patient is still sleeping.

## 2020-04-20 NOTE — Patient Instructions (Signed)
  Stop axitinib.  Stop hydrocodone/acetaminophen (pain medication).  Try Tramadol 1/2 pill every 6 hours as needed for pain.

## 2020-04-20 NOTE — ED Provider Notes (Signed)
[facility] Emergency Department Provider Note   ____________________________________________   First MD Initiated Contact with Patient 04/20/20 2137     (approximate)  I have reviewed the triage vital signs and the nursing notes.   HISTORY  Chief Complaint Hypertension    HPI SOL ODOR is a 74 y.o. female with hypertension and metastatic renal cell carcinoma who presents to the ED for elevated BP and leg swelling.  Patient was at her oncologist office today, where she was found to have a mildly elevated potassium at 5.5 along with lower extremity swelling.  Patient states that her legs have been more swollen over the past week or 2, but she denies any lower extremity pain.  Per family, she was sent over to the ED to potentially receive a dose of Lasix.  Patient has also been sleepier than usual over about the past week.  This was attributed to her pain medication, but she has continued to have issues with sleepiness even when her pain medication has been held.  She denies any fevers, cough, chest pain, shortness of breath, nausea, vomiting, dysuria, or hematuria.        History reviewed. No pertinent past medical history.  Patient Active Problem List   Diagnosis Date Noted  . Hypertension 04/20/2020  . Hyperkalemia 04/20/2020  . Bilateral lower extremity edema 04/20/2020  . Bone metastases ([place]) 04/10/2020  . Cancer-related pain 04/10/2020  . Encounter for antineoplastic immunotherapy 04/07/2020  . Goals of care, counseling/discussion 03/22/2020  . Renal cell carcinoma of right kidney ([place]) 03/16/2020  . Normocytic anemia 03/16/2020  . Folate deficiency 03/16/2020  . Hypercalcemia 03/03/2020    Past Surgical History:  Procedure Laterality Date  . PORTA CATH INSERTION N/A 03/28/2020   Procedure: PORTA CATH INSERTION;  Surgeon: Algernon Huxley, MD;  Location: [place] [facility];  Service: Cardiovascular;  Laterality: N/A;    Prior to Admission  medications   Medication Sig Start Date End Date Taking? Authorizing Provider  axitinib (INLYTA) 5 MG tablet Take 1 tablet (5 mg total) by mouth 2 (two) times daily. 03/22/20   Lequita Asal, MD  HYDROcodone-acetaminophen (NORCO/VICODIN) 5-325 MG tablet Take 1/2 or 1 pill every 6 hours as needed for pain. 04/14/20   Lequita Asal, MD  hydrOXYzine (ATARAX/VISTARIL) 25 MG tablet Take 25 mg by mouth every 8 (eight) hours as needed. 03/31/20   [provider]  lidocaine-prilocaine (EMLA) cream Apply to affected area once Patient not taking: Reported on 04/04/2020 04/01/20   Lequita Asal, MD  ondansetron (ZOFRAN) 8 MG tablet Take 1 tablet (8 mg total) by mouth 2 (two) times daily as needed (Nausea or vomiting). Patient not taking: Reported on 04/04/2020 04/01/20   Lequita Asal, MD  traMADol (ULTRAM) 50 MG tablet Take 0.5 tablets (25 mg total) by mouth every 6 (six) hours as needed for severe pain. 04/20/20   Lequita Asal, MD    Allergies Patient has no known allergies.  History reviewed. No pertinent family history.  Social History Social History   Tobacco Use  . Smoking status: Never Smoker  . Smokeless tobacco: Never Used  Vaping Use  . Vaping Use: Never used  Substance Use Topics  . Alcohol use: Yes    Alcohol/week: 1.0 standard drink    Types: 1 Cans of beer per week    Comment: occasional  . Drug use: Not Currently    Review of Systems  Constitutional: No fever/chills.  Positive for  fatigue. Eyes: No visual changes. ENT: No sore throat. Cardiovascular: Denies chest pain. Respiratory: Denies shortness of breath. Gastrointestinal: No abdominal pain.  No nausea, no vomiting.  No diarrhea.  No constipation. Genitourinary: Negative for dysuria. Musculoskeletal: Negative for back pain.  Positive for leg swelling. Skin: Negative for rash. Neurological: Negative for headaches, focal weakness or  numbness.  ____________________________________________   PHYSICAL EXAM:  VITAL SIGNS: ED Triage Vitals  Enc Vitals Group     BP 04/20/20 1804 (!) 189/79     Pulse Rate 04/20/20 1804 78     Resp 04/20/20 1804 18     Temp 04/20/20 1804 98.5 F (36.9 C)     Temp Source 04/20/20 1804 Oral     SpO2 04/20/20 1804 93 %     Weight 04/20/20 1805 151 lb 11.2 oz (68.8 kg)     Height 04/20/20 1805 5\' 2"  (1.575 m)     Head Circumference --      Peak Flow --      Pain Score 04/20/20 1805 7     Pain Loc --      Pain Edu? --      Excl. in [place]? --     Constitutional: Alert and oriented. Eyes: Conjunctivae are normal. Head: Atraumatic. Nose: No congestion/rhinnorhea. Mouth/Throat: Mucous membranes are moist. Neck: Normal ROM Cardiovascular: Normal rate, regular rhythm. Grossly normal heart sounds. Respiratory: Normal respiratory effort.  No retractions. Lungs CTAB. Gastrointestinal: Soft and nontender. No distention. Genitourinary: deferred Musculoskeletal: 1+ pitting edema to knees bilaterally with no lower extremity tenderness. Neurologic:  Normal speech and language. No gross focal neurologic deficits are appreciated. Skin:  Skin is warm, dry and intact. No rash noted. Psychiatric: Mood and affect are normal. Speech and behavior are normal.  ____________________________________________   LABS (all labs ordered are listed, but only abnormal results are displayed)  Labs Reviewed  COMPREHENSIVE METABOLIC PANEL - Abnormal; Notable for the following components:      Result Value   Sodium 132 (*)    Potassium 5.3 (*)    CO2 20 (*)    Glucose, Bld 114 (*)    Calcium 8.2 (*)    Albumin 2.2 (*)    AST 58 (*)    Alkaline Phosphatase 127 (*)    All other components within normal limits  CBC - Abnormal; Notable for the following components:   RBC 3.83 (*)    Hemoglobin 9.6 (*)    HCT 31.5 (*)    MCH 25.1 (*)    RDW 18.3 (*)    All other components within normal limits  LIPASE,  BLOOD  URINALYSIS, COMPLETE (UACMP) WITH MICROSCOPIC   ____________________________________________  EKG  ED ECG REPORT I, Blake Divine, the attending physician, personally viewed and interpreted this ECG.   Date: 04/20/2020  EKG Time: 18:01  Rate: 76  Rhythm: normal sinus rhythm  Axis: Normal  Intervals:none  ST&T Change: None   PROCEDURES  Procedure(s) performed (including Critical Care):  Procedures   ____________________________________________   INITIAL IMPRESSION / ASSESSMENT AND PLAN / ED COURSE       74 year old female with past medical history of hypertension and metastatic renal cell carcinoma who presents to the ED for hyperkalemia and lower extremity swelling noted at her oncology visit earlier today.  Potassium is very mildly elevated here in the ED at 5.3, no associated EKG changes noted.  We will give dose of Lasix which should help improve both hyperkalemia and her lower extremity swelling.  Remainder of  lab work is reassuring, we will also screen chest x-ray and UA for evidence of infectious process to explain her fatigue.  CT head was performed and negative.  Family also states that oncologist requested lower extremity ultrasound to rule out DVT with her swelling.  Chest x-ray is negative for acute processes, UA is pending.  Lower extremity ultrasound is also pending.  Patient turned over to oncoming provider pending additional results, which if they are unremarkable patient may be discharged home.  She has follow-up with oncology scheduled for tomorrow morning for recheck of her electrolytes.  Patient and family agree with plan.      ____________________________________________   FINAL CLINICAL IMPRESSION(S) / ED DIAGNOSES  Final diagnoses:  Fatigue, unspecified type  Hyperkalemia  Renal cell carcinoma of right kidney [facility])     ED Discharge Orders    None       Note:  This document was prepared using Dragon voice recognition software and  may include unintentional dictation errors.   Blake Divine, MD 04/20/20 [phone]

## 2020-04-20 NOTE — ED Triage Notes (Signed)
Cancer center called to inform this RN that pt was coming to ED for evaluation of pain, bilateral lower extremity edema and HTN. MD also states pt is sleepy.  Pt has metastic  Renal cancer. MD states K+5.5

## 2020-04-20 NOTE — Progress Notes (Signed)
[facility]  [address] [place], [place] [address] Phone: [phone]  Fax: [phone]   Clinic Day:  04/20/2020  Referring physician: Philmore Pali, NP  Chief Complaint: Mariah Thornton is a 74 y.o. female with metastatic clear cell renal cell carcinoma currently day 6 of axitinib and day 14 of cycle #1 pembrolizumab seen for sick call visit.  HPI:  The patient was last seen in the medical oncology clinic on 04/14/2020. At that time, she felt fine.  She denied any side effects s/p pembrolizumab. She had some constipation.  She noted pain in her groin and back.  Hematocrit was 29.2, hemoglobin 8.5, MCV 85.6 platelets 197,000, WBC 6,000. Sodium was 134. Creatinine was 1.12 (CrCl 48 ml/min). Calcium was 8.2 and albumin was 2.2.  A prescription for hydrocodone/acetaminophen 5/325 1/2-1 tablet every 6 hours was provided.  She was to begin axitinib 5 mg BID.  She is scheduled for a lumbar spine MRI with and without contrast on 04/22/2020.  The patient family called yesterday regarding her being drowsy with her pain medications.  It was recommended that her pain medication be held and Tylenol given for pain.  Her husband was contacted this morning.  As she was still drowsy, it was recommended that she come in for evaluation.  During the interim, she has been "ok".  She describes most of the pain is in her pubic area, but she also has a little bit of back pain. Right now, she describes her pain as "casual." The pain never gets all the way to a 10. She has been taking a whole pill of hydrocodone/acetaminophen 5/325 every 6 hours and it causes her to sleep all day and be groggy. Pain medications are given at 8AM, 2 PM, 8 PM and 2 AM.  She wakes up at 2 AM needing to void; she has pain at that time. The pain pill takes her pain away.  She has been drinking a lot of fluids at home. Her appetite is poor. She drinks Ensure. She has a little bit of constipation. She states  that she is constantly squinting and that the sun hurts her eyes. She is having sinus problems and states that "her head feels like it is in a vacuum." She is holding onto fluid; she has swelling in her legs. She denies any nausea, vomiting, or diarrhea.  Mariah Thornton's daughter puts all of the patient's pills for the week in a container. The patient's husband gives her the medication.   History reviewed. No pertinent past medical history.  Past Surgical History:  Procedure Laterality Date  . PORTA CATH INSERTION N/A 03/28/2020   Procedure: PORTA CATH INSERTION;  Surgeon: Algernon Huxley, MD;  Location: [place] [facility];  Service: Cardiovascular;  Laterality: N/A;    History reviewed. No pertinent family history.  Social History:  reports that she has never smoked. She has never used smokeless tobacco. She reports current alcohol use of about 1.0 standard drink of alcohol per week. She reports previous drug use. reports that she has never smoked. She has never used smokeless tobacco. She reports previous alcohol use. She reports previous drug use.  The patient denies any exposure to radiation or toxins.  She states that she is retired.  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  Her husband's name is Mariah Thornton.  The patient lives in [place], [place]. The patient is accompanied by her friend Mariah Thornton (in person) today.  Allergies: No  Known Allergies  Current Medications: Current Outpatient Medications  Medication Sig Dispense Refill  . axitinib (INLYTA) 5 MG tablet Take 1 tablet (5 mg total) by mouth 2 (two) times daily. 60 tablet 0  . HYDROcodone-acetaminophen (NORCO/VICODIN) 5-325 MG tablet Take 1/2 or 1 pill every 6 hours as needed for pain. 30 tablet 0  . hydrOXYzine (ATARAX/VISTARIL) 25 MG tablet Take 25 mg by mouth every 8 (eight) hours as needed.    . lidocaine-prilocaine (EMLA) cream Apply to affected area once (Patient not taking: Reported on 04/04/2020) 30 g 3  . ondansetron  (ZOFRAN) 8 MG tablet Take 1 tablet (8 mg total) by mouth 2 (two) times daily as needed (Nausea or vomiting). (Patient not taking: Reported on 04/04/2020) 30 tablet 1  . traMADol (ULTRAM) 50 MG tablet Take 0.5 tablets (25 mg total) by mouth every 6 (six) hours as needed for severe pain. 30 tablet 0   No current facility-administered medications for this visit.    Review of Systems  Constitutional: Negative for chills, diaphoresis, fever, malaise/fatigue and weight loss (stable).       Feels "groggy" from hydrocodone/acetaminophen.  HENT: Negative for congestion, ear discharge, ear pain, hearing loss, nosebleeds, sinus pain, sore throat and tinnitus.        Sinus problems.  Eyes: Positive for photophobia. Negative for blurred vision and double vision.       "Squinting all the time."  Respiratory: Negative for cough, hemoptysis, sputum production and shortness of breath.   Cardiovascular: Negative for chest pain, palpitations and leg swelling.  Gastrointestinal: Positive for constipation (mild). Negative for abdominal pain, blood in stool, diarrhea, heartburn, melena, nausea and vomiting.       Drinks Ensure. Poor appetite. Drinks lots of water and tea.  Genitourinary: Negative for dysuria, frequency, hematuria and urgency.  Musculoskeletal: Positive for back pain. Negative for joint pain, myalgias and neck pain.       Pain in her pubic area.  Skin: Negative for itching and rash.  Neurological: Negative for dizziness, tingling, sensory change, weakness and headaches.  Endo/Heme/Allergies: Does not bruise/bleed easily.  Psychiatric/Behavioral: Negative for depression and memory loss. The patient is not nervous/anxious and does not have insomnia.   All other systems reviewed and are negative.  Performance status (ECOG): 2  Vitals Blood pressure (!) 188/73, temperature 98.6 F (37 C), temperature source Tympanic, resp. rate 18, height 5\' 2"  (1.575 m), weight 151 lb 11.2 oz (68.8 kg), SpO2 97  %.  Physical Exam Vitals and nursing note reviewed.  Constitutional:      Comments: Patient sitting in a wheelchair in no acute distress. She was examined in the wheelchair.  HENT:     Head: Normocephalic and atraumatic.     Comments: Short disheveled gray hair.    Mouth/Throat:     Mouth: Mucous membranes are moist.     Pharynx: Oropharynx is clear.  Eyes:     General: No scleral icterus.    Extraocular Movements: Extraocular movements intact.     Conjunctiva/sclera: Conjunctivae normal.     Pupils: Pupils are equal, round, and reactive to light.  Neck:     Comments: 7 cm x 4.5 cm mass on base of posterior neck Cardiovascular:     Rate and Rhythm: Normal rate and regular rhythm.     Heart sounds: Normal heart sounds. No murmur heard.   Pulmonary:     Effort: Pulmonary effort is normal. No respiratory distress.     Breath sounds: Normal breath sounds.  No wheezing or rales.  Abdominal:     General: Bowel sounds are normal.     Palpations: There is no mass.     Tenderness: There is no abdominal tenderness.  Musculoskeletal:        General: Tenderness (BLE) present. No swelling. Normal range of motion.     Cervical back: Normal range of motion and neck supple.     Right lower leg: Edema present.     Left lower leg: Edema present.     Comments: Edema extends from thigh distally.  Skin:    General: Skin is warm and dry.  Neurological:     Mental Status: She is alert and oriented to person, place, and time. Mental status is at baseline.  Psychiatric:        Behavior: Behavior normal.        Thought Content: Thought content normal.        Judgment: Judgment normal.      03/16/2020     04/04/2020    Appointment on 04/20/2020  Component Date Value Ref Range Status  . Sodium 04/20/2020 129* 135 - 145 mmol/L Final  . Potassium 04/20/2020 5.5* 3.5 - 5.1 mmol/L Final  . Chloride 04/20/2020 101  98 - 111 mmol/L Final  . CO2 04/20/2020 20* 22 - 32 mmol/L Final  . Glucose,  Bld 04/20/2020 137* 70 - 99 mg/dL Final   Glucose reference range applies only to samples taken after fasting for at least 8 hours.  . BUN 04/20/2020 22  8 - 23 mg/dL Final  . Creatinine, Ser 04/20/2020 0.91  0.44 - 1.00 mg/dL Final  . Calcium 04/20/2020 7.8* 8.9 - 10.3 mg/dL Final  . Total Protein 04/20/2020 6.7  6.5 - 8.1 g/dL Final  . Albumin 04/20/2020 2.0* 3.5 - 5.0 g/dL Final  . AST 04/20/2020 56* 15 - 41 U/L Final  . ALT 04/20/2020 12  0 - 44 U/L Final  . Alkaline Phosphatase 04/20/2020 118  38 - 126 U/L Final  . Total Bilirubin 04/20/2020 0.8  0.3 - 1.2 mg/dL Final  . GFR, Estimated 04/20/2020 >60  >60 mL/min Final  . Anion gap 04/20/2020 8  5 - 15 Final   Performed at [facility], [address] [place], [place] [address]  . WBC 04/20/2020 8.1  4.0 - 10.5 K/uL Final  . RBC 04/20/2020 3.68* 3.87 - 5.11 MIL/uL Final  . Hemoglobin 04/20/2020 9.2* 12.0 - 15.0 g/dL Final  . HCT 04/20/2020 30.5* 36 - 46 % Final  . MCV 04/20/2020 82.9  80.0 - 100.0 fL Final  . MCH 04/20/2020 25.0* 26.0 - 34.0 pg Final  . MCHC 04/20/2020 30.2  30.0 - 36.0 g/dL Final  . RDW 04/20/2020 18.6* 11.5 - 15.5 % Final  . Platelets 04/20/2020 142* 150 - 400 K/uL Final  . nRBC 04/20/2020 0.0  0.0 - 0.2 % Final  . Neutrophils Relative % 04/20/2020 70  % Final  . Neutro Abs 04/20/2020 5.7  1.7 - 7.7 K/uL Final  . Lymphocytes Relative 04/20/2020 12  % Final  . Lymphs Abs 04/20/2020 1.0  0.7 - 4.0 K/uL Final  . Monocytes Relative 04/20/2020 11  % Final  . Monocytes Absolute 04/20/2020 0.9  0.1 - 1.0 K/uL Final  . Eosinophils Relative 04/20/2020 3  % Final  . Eosinophils Absolute 04/20/2020 0.3  0.0 - 0.5 K/uL Final  . Basophils Relative 04/20/2020 1  % Final  . Basophils Absolute 04/20/2020 0.0  0.0 - 0.1  K/uL Final  . Immature Granulocytes 04/20/2020 3  % Final  . Abs Immature Granulocytes 04/20/2020 0.21* 0.00 - 0.07 K/uL Final   Performed at [facility], [address]  [place], [place] [address]    Assessment:  Mariah Thornton is a 74 y.o. female with metastatic renal cell carcinoma s/p CT guided biopsy of a 5.6 x 4.0 cm soft tissue mass in the posterior neck on 03/08/2020.  Pathology revealed metastatic carcinoma with clear cell and oncocytic features.  Cytokeratin 7 was negative, cytokeratin 20, focally positive, PAX 8 positive, GATA3 negative and p63 negative.  Based on the pattern of immunoreactivity, metastatic carcinoma is compatible with metastatic renal cell carcinoma.  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 She presented with hypercalcemia.   LDH was 245 on 03/05/2020.  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  There was a possible lytic lesion within the L2 vertebral body.  Abdomen and pelvis CT on 03/04/2020 revealed an 8.8 x 8.5 x 7.5 cm heterogeneous mass arising from the anterior right kidney.  There were numerous and large retroperitoneal lymph nodes.  There are multiple lytic osseous lesions involving the L2 and L3 vertebral bodies with clear cortical and medullary destruction.  There are multiple additional subtle osseous lucencies throughout the spine and pelvis were suspicious for subtle metastatic disease.  There is a small right and trace left pleural effusion and multiple bilateral pulmonary nodules.  There was stigmata of cirrhosis and splenomegaly with trace ascites.  Bone scan on 04/05/2020 revealed foci of abnormal tracer uptake at the inferior RIGHT scapula, L1 and L2 vertebral bodies, and distal LEFT femoral metaphysis c/w osseous metastases.  Left femur films on 04/07/2020 revealed no suspicious lytic or sclerotic osseous abnormality in the metaphysis of the distal left femur .  She  Is day 14 of cycle #1 pembrolizumab (began 04/07/2020).  She is day 6 of axitinib (began 04/15/2020).  She  has a history of hypercalcemia.   She received Zometa on 03/04/2020 and 04/04/2020.  Echo on 04/06/2020 revealed an EF of 70-75%.  She has folate deficiency.  Folate was 4.8 on 03/06/2020.  She is on oral folic acid.  B12 was 2351 on 03/06/2020.  She received the [place] COVID-19 vaccine on 11/11/2019 and 12/08/2019.  Symptomatically, she is groggy after taking hydrocodone/acetaminphen 5/325 every 6 hours.  Blood pressure is high (206/166; repeat 181/74).  She has bilateral lower extremity edema.  Potassium is  5.5 (elevated).  Plan: 1.Labs today:  CBC with diff, CMP 2.  Metastatic clear cell renal cell carcinoma She has a large right renal mass, multiple lytic bone lesions, and a mass on her posterior neck.  She is in a poor prognostic group based on [facility] criteria (metastatic disease, KPS < 80%, hypercalcemia, elevated LDH, and anemia). She is day 1 of axitinib (Inlyta) and day 14 pembrolizumab.   Blood pressure is elevated and likely secondary to axitinib.    Hold axitinib.   She has hyperkalemia    Etiology likely secondary to axitinib.  Clinically, her tumor is smaller. 3. Hypercalcemia Calcium is 7.8 (corrected 9.5).  She has had hypercalcemia associated with malignancy. She receives Zometa monthly (last 04/04/2020). 4. Normocytic anemia Hematocrit 30.5.  Hemoglobin 9.2.  MCV 82.9.  Etiology likely secondary to renal cell carcinoma. Patient denies any bleeding. Ferritin 660 with an iron saturation of 11% and a TIBC of  150.             Folate was 4.8 (low) with a B12 of 2351.   She is on folic acid.  Continue to monitor. 5.   Cancer-related pain  She has back pain and pain in the pubic bone.   Lumbar spine MRI is scheduled for 04/22/2020.  Pain is well managed with hydrocodone/acetamonophen, but makes her extremely drowsy.   She has used 1 tablet instead of 1/2 tablet.   Discontinue  hydrocodone/acetamonophen.  Discuss trial of Tramadol 1/2 tablet (25 mg) every 6 hours prn pain.   Rx sent in today.  She may be a candidate for future local radiation. 6.   Hypertension  Initial BP 206/166; repeat 181/74.  Etiology secondary to axitinib.  Discuss management in the ER. 7.   Bilateral lower extremity edema  Discuss bilateral lower extremity duplex to r/o DVT.  Consider Lasix for diuresis. 8.  Hyperkalemia  Patient not on supplemental potassium.  Axitinib can cause hyperkalemia (15% incidence).  Consider Lasix as above. 9.   Patient to [facility] ER for hypertension, lower extremity edema and hyperkalemia. 10.   RTC tomorrow for MD assessment and labs (BMP).  I discussed the assessment and treatment plan with the patient.  The patient was provided an opportunity to ask questions and all were answered.  The patient agreed with the plan and demonstrated an understanding of the instructions.  The patient was advised to call back if the symptoms worsen or if the condition fails to improve as anticipated.  I provided 20 minutes of face-to-face time during this this encounter and > 50% was spent counseling as documented under my assessment and plan.  An additional 10 minutes were spent reviewing her chart ([place] [facility] [place]) including notes, labs, and imaging studies and contacting the [facility] for report.    Taevyn Hausen C. Mike Gip, MD, PhD    04/20/2020, 6:17 PM  I, De Burrs, am acting as scribe for [employer]. Mike Gip, MD, PhD.  I, Cherye Gaertner C. Mike Gip, MD, have reviewed the above documentation for accuracy and completeness, and I agree with the above.

## 2020-04-20 NOTE — ED Triage Notes (Signed)
Pt presents w/ c/o hypertension and bilateral LE edema, brawny pitting, 2+. Pulses palpable in BLE.

## 2020-04-21 ENCOUNTER — Encounter: Payer: Self-pay | Admitting: Hematology and Oncology

## 2020-04-21 ENCOUNTER — Observation Stay: Payer: Medicare Other

## 2020-04-21 ENCOUNTER — Ambulatory Visit: Payer: Medicare Other | Admitting: Hematology and Oncology

## 2020-04-21 ENCOUNTER — Other Ambulatory Visit: Payer: Medicare Other

## 2020-04-21 ENCOUNTER — Emergency Department: Payer: Medicare Other

## 2020-04-21 ENCOUNTER — Inpatient Hospital Stay: Payer: Medicare Other | Admitting: Hematology and Oncology

## 2020-04-21 DIAGNOSIS — E875 Hyperkalemia: Secondary | ICD-10-CM

## 2020-04-21 DIAGNOSIS — C7951 Secondary malignant neoplasm of bone: Secondary | ICD-10-CM | POA: Diagnosis not present

## 2020-04-21 DIAGNOSIS — R5383 Other fatigue: Secondary | ICD-10-CM | POA: Diagnosis not present

## 2020-04-21 DIAGNOSIS — I10 Essential (primary) hypertension: Secondary | ICD-10-CM

## 2020-04-21 DIAGNOSIS — R400 Somnolence: Secondary | ICD-10-CM

## 2020-04-21 DIAGNOSIS — C641 Malignant neoplasm of right kidney, except renal pelvis: Secondary | ICD-10-CM

## 2020-04-21 DIAGNOSIS — M545 Low back pain, unspecified: Secondary | ICD-10-CM

## 2020-04-21 DIAGNOSIS — I639 Cerebral infarction, unspecified: Secondary | ICD-10-CM | POA: Diagnosis not present

## 2020-04-21 LAB — URINALYSIS, COMPLETE (UACMP) WITH MICROSCOPIC
Bilirubin Urine: NEGATIVE
Glucose, UA: NEGATIVE mg/dL
Ketones, ur: NEGATIVE mg/dL
Leukocytes,Ua: NEGATIVE
Nitrite: NEGATIVE
Protein, ur: NEGATIVE mg/dL
Specific Gravity, Urine: 1.006 (ref 1.005–1.030)
Squamous Epithelial / HPF: NONE SEEN (ref 0–5)
pH: 5 (ref 5.0–8.0)

## 2020-04-21 LAB — RESPIRATORY PANEL BY RT PCR (FLU A&B, COVID)
Influenza A by PCR: NEGATIVE
Influenza B by PCR: NEGATIVE
SARS Coronavirus 2 by RT PCR: NEGATIVE

## 2020-04-21 LAB — AMMONIA: Ammonia: 16 umol/L (ref 9–35)

## 2020-04-21 IMAGING — MR MR HEAD W/ CM
6 series · 48 of 48 positions shown · IV contrast (6ml Gadavist)
Comparison: Noncontrast brain MRI [S4] hours today.

CLINICAL DATA: 74-year-old female with altered mental status.
Metastatic renal cell carcinoma. Multiple small embolic infarcts
versus less likely small hyperdense metastases, suspected in the
hemispheres on MRI earlier today.

EXAM:
MRI HEAD WITH CONTRAST
TECHNIQUE: Multiplanar, multiecho pulse sequences of the brain and surrounding
structures were obtained with intravenous contrast.
CONTRAST:  6mL GADAVIST GADOBUTROL 1 MMOL/ML IV SOLN

[Series 24: T1 · sagittal · 5.0mm · 0.62mm/px · 3 of 25 slices shown (1 of 2)]
[im 1/25]
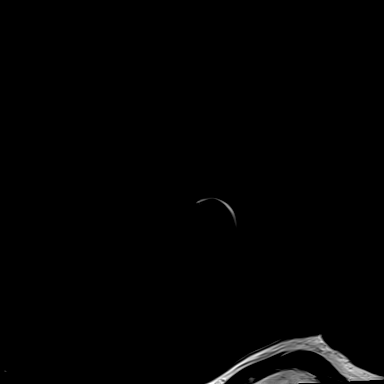
[im 13/25]
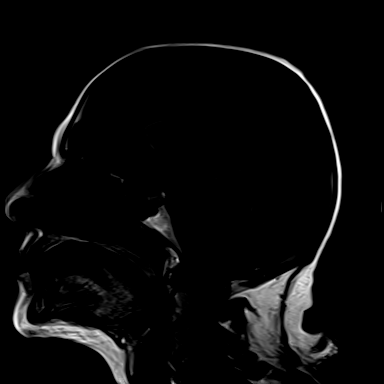
[im 25/25]
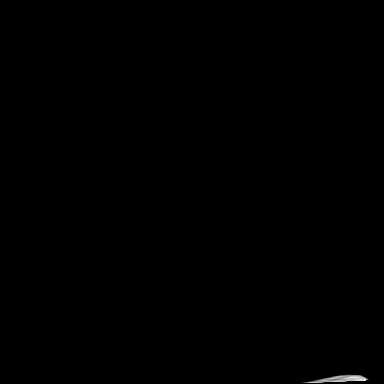

[Series 25: T1 · axial · 1.0mm · 0.98mm/px · z∈[+471,+646]mm · 18 of 176 slices shown (2 of 2)]
[im 1/176]
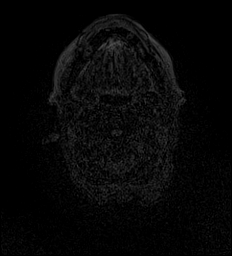
[im 11/176]
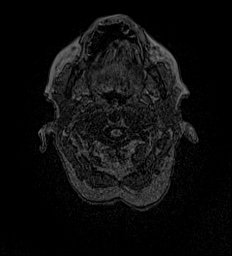
[im 21/176]
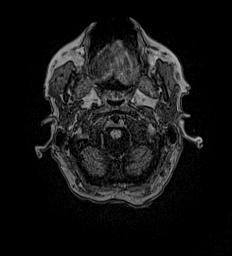
[im 31/176]
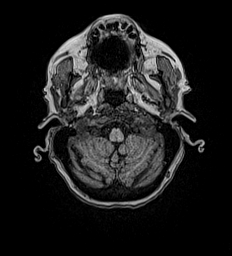
[im 42/176]
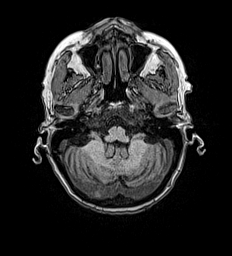
[im 52/176]
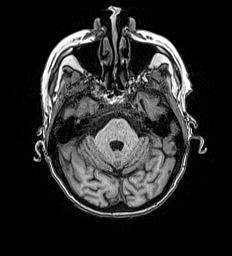
[im 62/176]
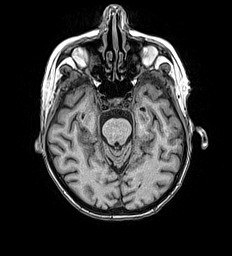
[im 73/176]
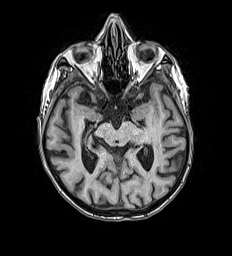
[im 83/176]
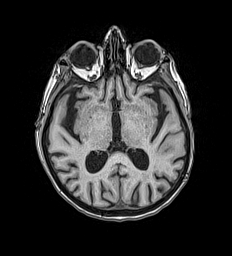
[im 93/176]
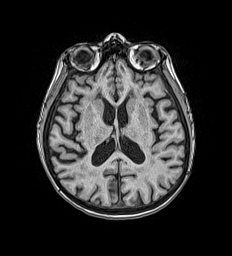
[im 103/176]
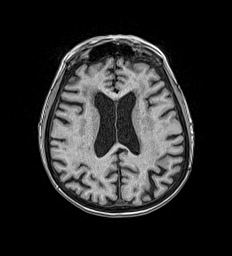
[im 114/176]
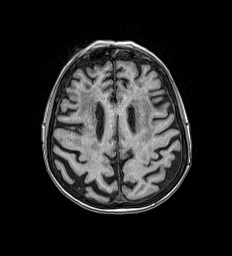
[im 124/176]
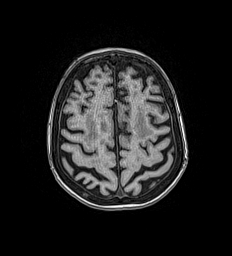
[im 134/176]
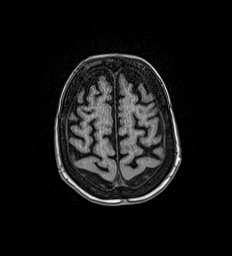
[im 145/176]
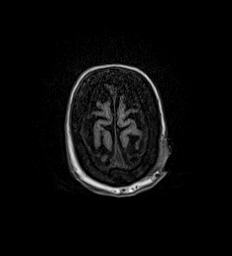
[im 155/176]
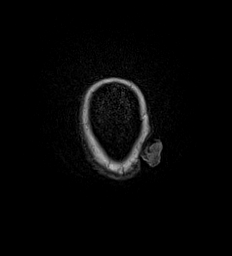
[im 165/176]
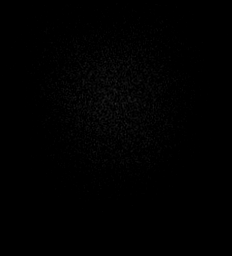
[im 176/176]
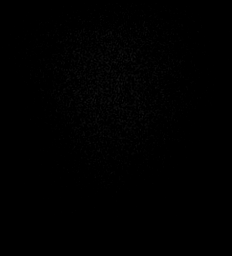

[Series 26: T1 post-contrast · axial · 1.0mm · 0.98mm/px · z∈[+471,+646]mm · 18 of 176 slices shown (1 of 3)]
[im 1/176]
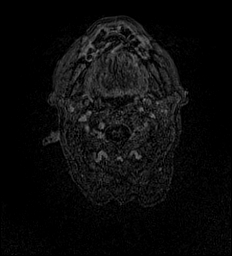
[im 11/176]
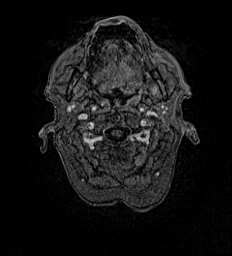
[im 21/176]
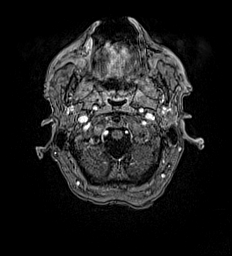
[im 31/176]
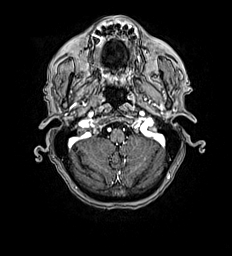
[im 42/176]
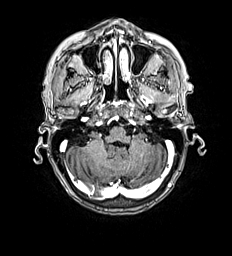
[im 52/176]
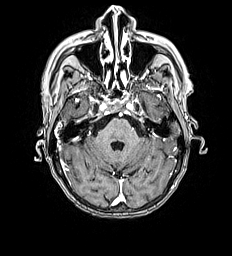
[im 62/176]
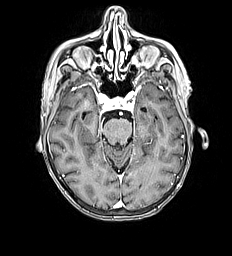
[im 73/176]
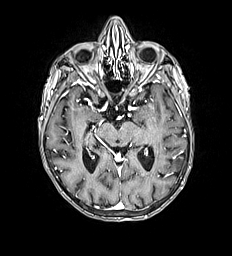
[im 83/176]
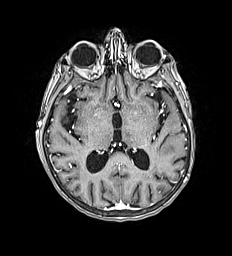
[im 93/176]
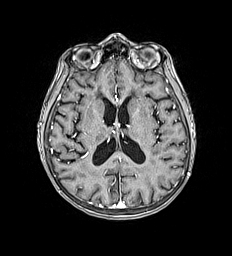
[im 103/176]
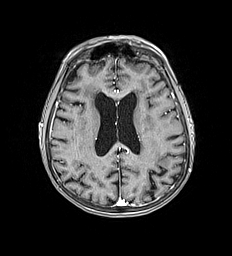
[im 114/176]
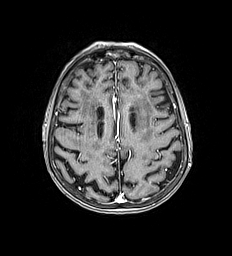
[im 124/176]
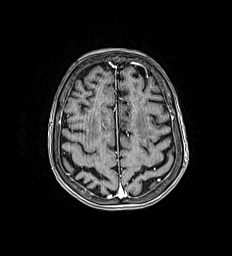
[im 134/176]
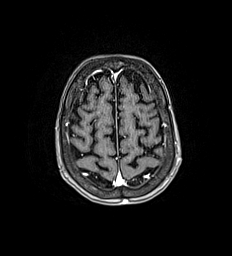
[im 145/176]
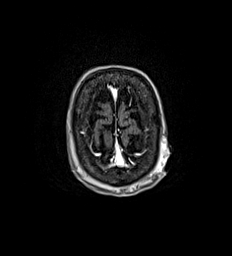
[im 155/176]
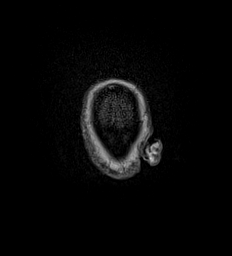
[im 165/176]
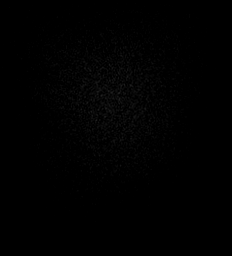
[im 176/176]
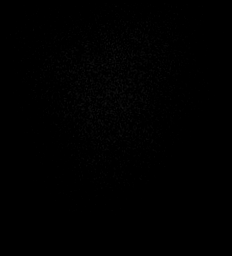

[Series 27: T1 post-contrast · sagittal · 5.0mm · 0.62mm/px · 3 of 25 slices shown (2 of 3)]
[im 1/25]
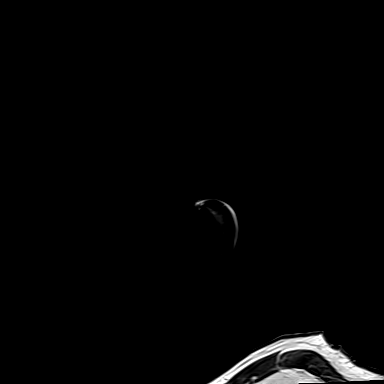
[im 13/25]
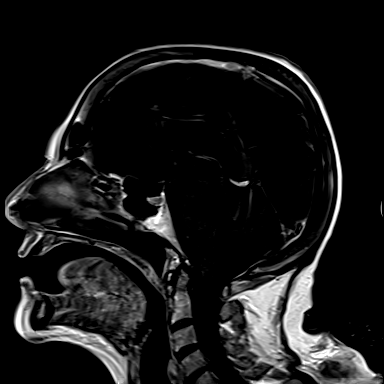
[im 25/25]
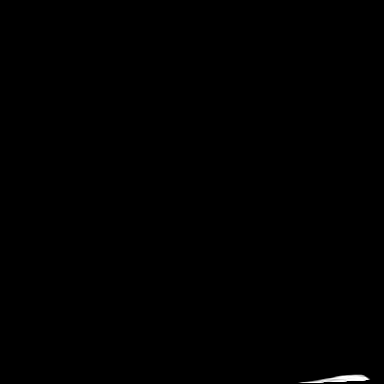

[Series 28: T1 post-contrast · coronal · 5.0mm · 0.57mm/px · 3 of 29 slices shown (3 of 3)]
[im 1/29]
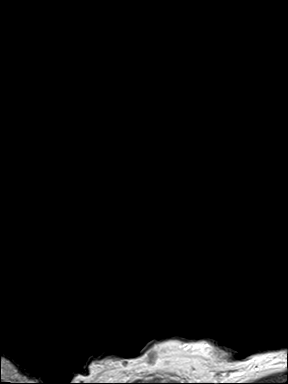
[im 15/29]
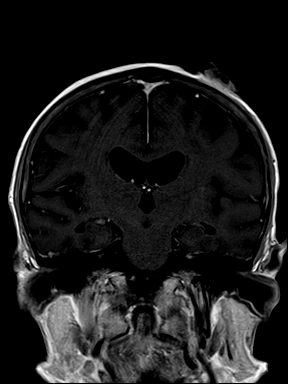
[im 29/29]
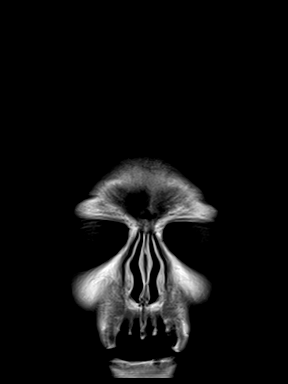

[Series 29: T2 post-contrast · coronal · 5.0mm · 0.57mm/px · 3 of 29 slices shown]
[im 1/29]
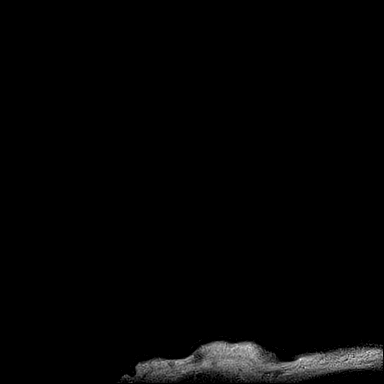
[im 15/29]
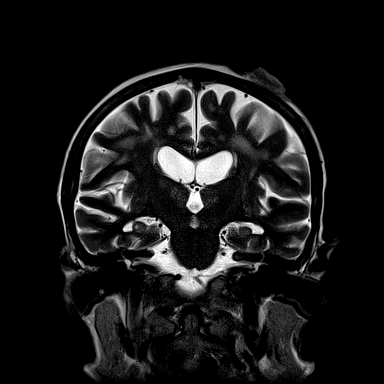
[im 29/29]
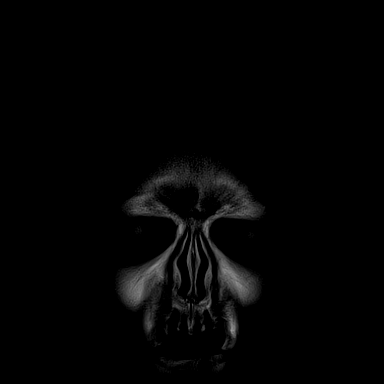

[48 of 48 positions shown; findings below may reference images not displayed]

FINDINGS: No abnormal intracranial enhancement identified. No enhancement
corresponding to the multiple small DWI lesions seen earlier today.
No dural thickening.

No intracranial mass effect. No ventriculomegaly. Basilar cisterns
remain normal. The major dural venous sinuses are enhancing and
appear to be patent.

Negative visible cervical spine and spinal cord. Heterogeneous bone
marrow signal in the calvarium but no enhancing skull lesion
identified.

However, there is enhancement associated with the lobulated left
vertex scalp lesion (series 28, image 14).
IMPRESSION: 1. No evidence of cerebral metastatic disease.
No enhancement associated with the small DWI lesions demonstrated
earlier today, which are therefore most compatible with multiple
small infarcts.

2. The irregular 3.2 cm left vertex scalp soft mass is enhancing.

## 2020-04-21 IMAGING — MR MR LUMBAR SPINE WO/W CM
6 of 7 series · 30 of 48 positions shown · IV contrast (gadavist)
Comparison: CT Abdomen and Pelvis [DATE].

CLINICAL DATA: 74-year-old female with altered mental status.
Metastatic renal cell carcinoma. Staging.

EXAM:
MRI LUMBAR SPINE WITHOUT AND WITH CONTRAST
TECHNIQUE: Multiplanar and multiecho pulse sequences of the lumbar spine were
obtained without and with intravenous contrast.
CONTRAST:  6mL GADAVIST GADOBUTROL 1 MMOL/ML IV SOLN

[Series 9: T2 · sagittal · 4.0mm · 0.81mm/px · 4 of 17 slices shown (1 of 2)]
[im 1/17]
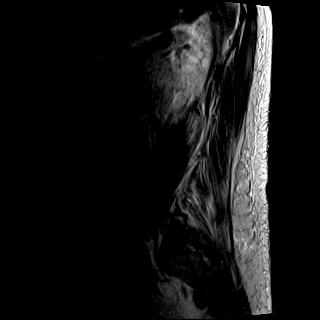
[im 6/17]
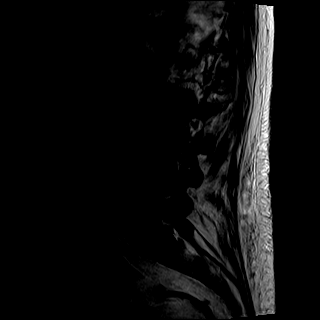
[im 11/17]
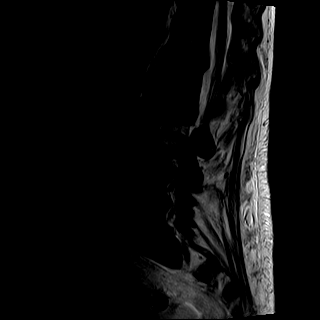
[im 17/17]
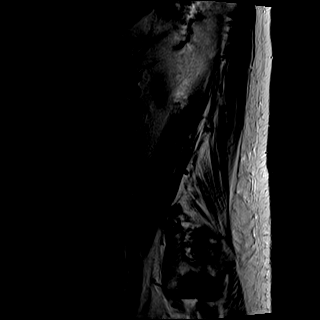

[Series 10: T1 · sagittal · 4.0mm · 0.81mm/px · 4 of 17 slices shown (1 of 2)]
[im 1/17]
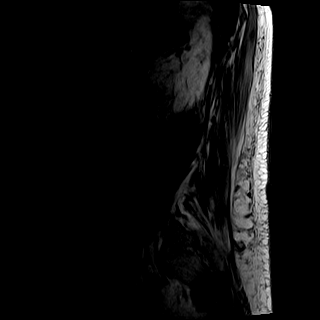
[im 6/17]
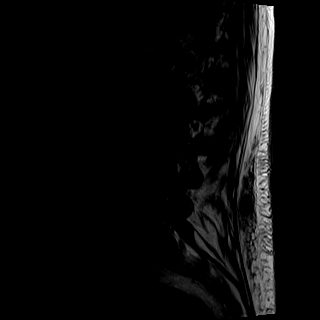
[im 11/17]
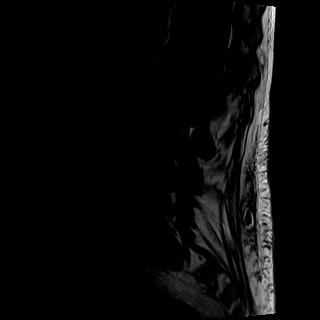
[im 17/17]
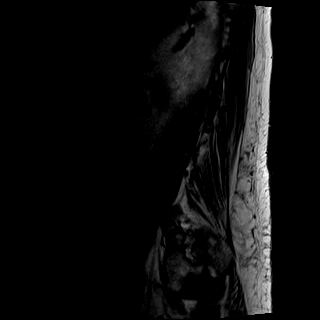

[Series 11: STIR · sagittal · 4.0mm · 0.41mm/px · 1 of 17 slices shown]
[im 1/17]
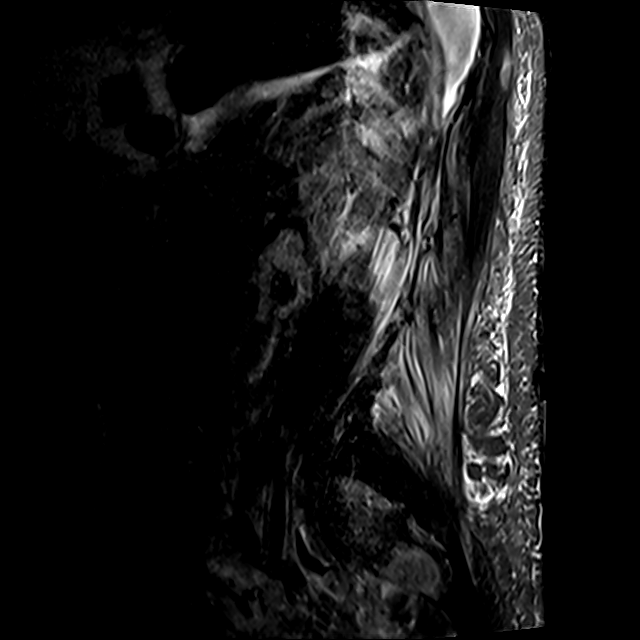

[Series 12: T2 · axial · 4.0mm · 0.78mm/px · z∈[-18,+179]mm · 8 of 34 slices shown (2 of 2)]
[im 1/34]
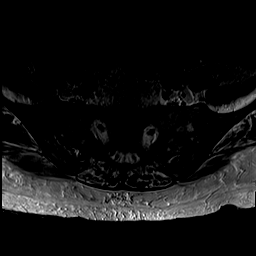
[im 4/34]
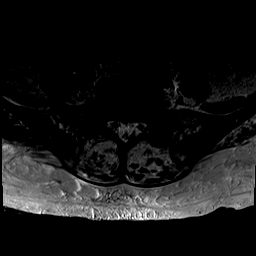
[im 12/34]
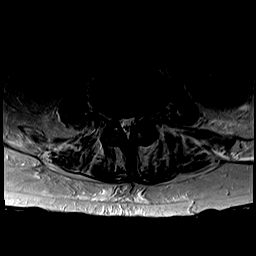
[im 15/34]
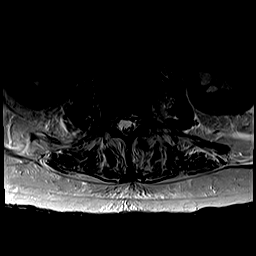
[im 19/34]
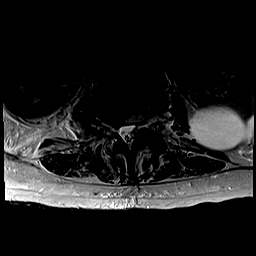
[im 23/34]
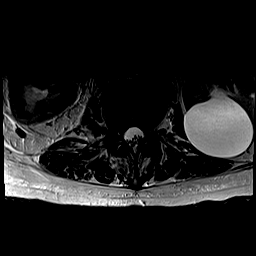
[im 30/34]
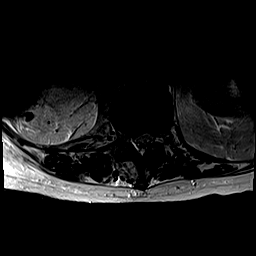
[im 34/34]
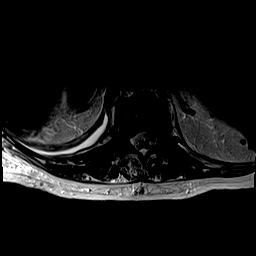

[Series 13: T1 · axial · 4.0mm · 0.39mm/px · z∈[-18,+179]mm · 8 of 34 slices shown (2 of 2)]
[im 1/34]
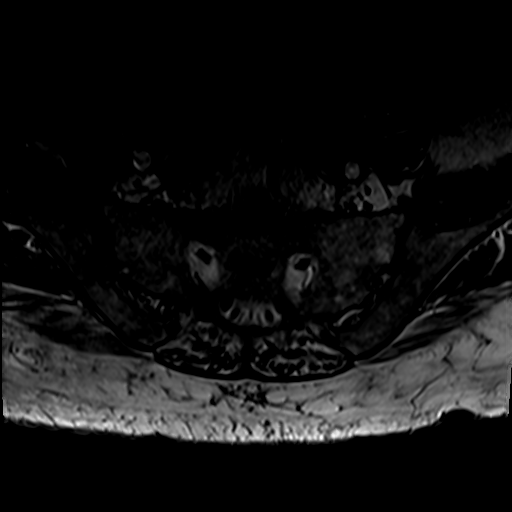
[im 4/34]
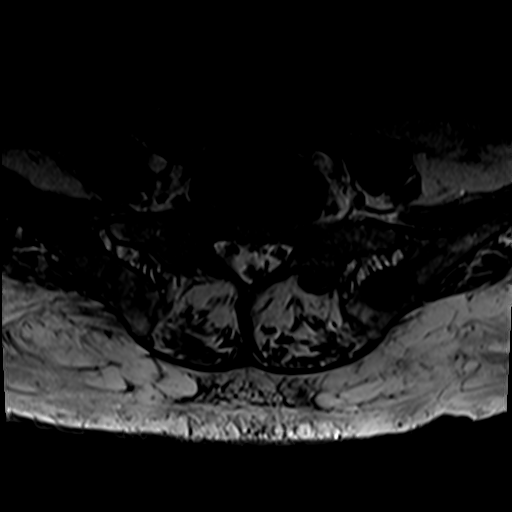
[im 12/34]
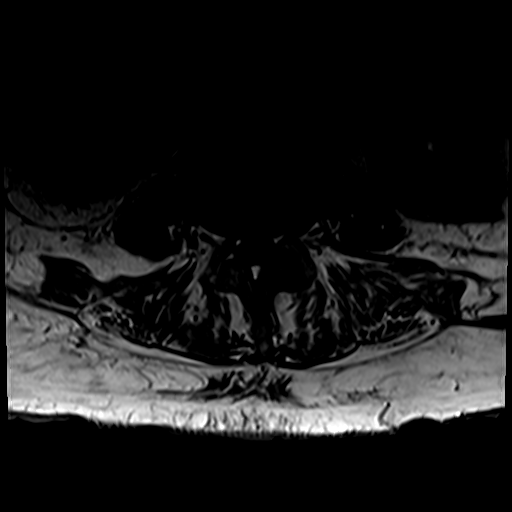
[im 15/34]
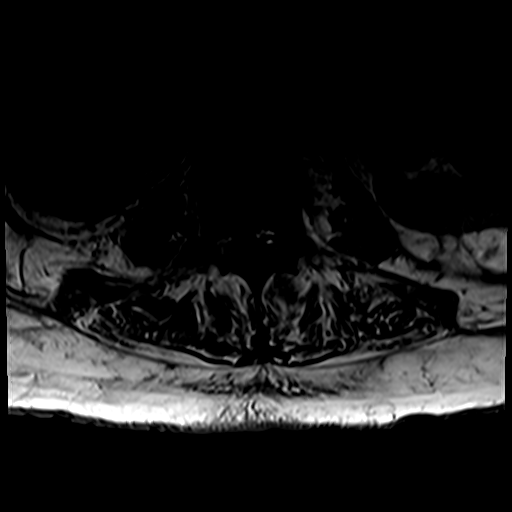
[im 19/34]
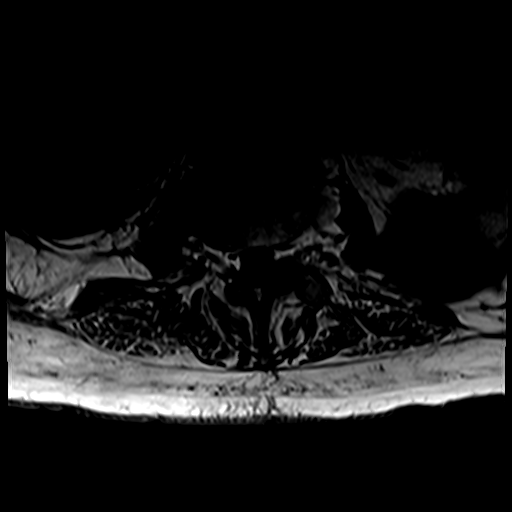
[im 23/34]
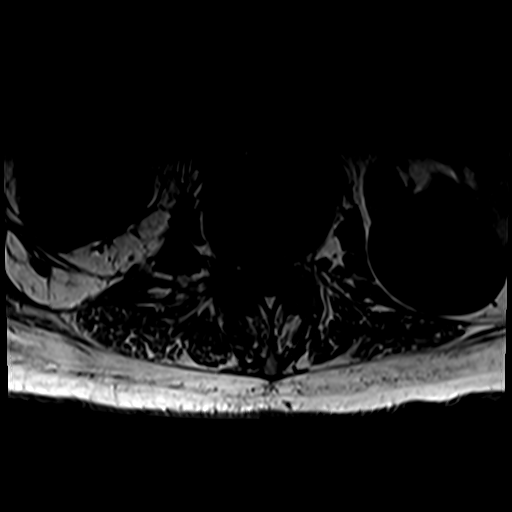
[im 30/34]
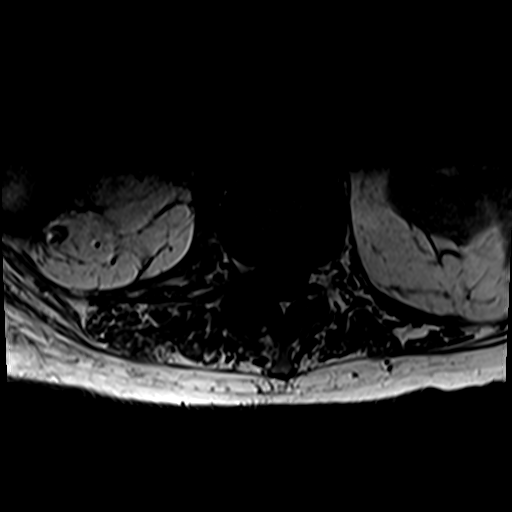
[im 34/34]
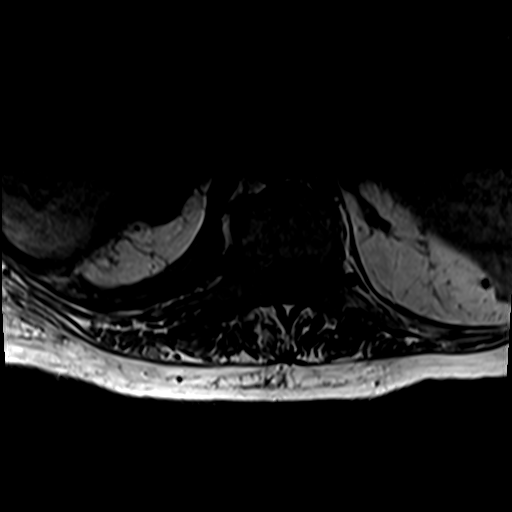

[Series 30: T1 fat-sat post-contrast · sagittal · 4.0mm · 0.81mm/px · 5 of 17 slices shown]
[im 1/17]
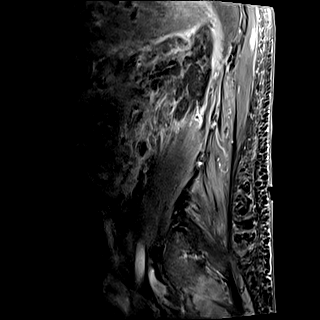
[im 5/17]
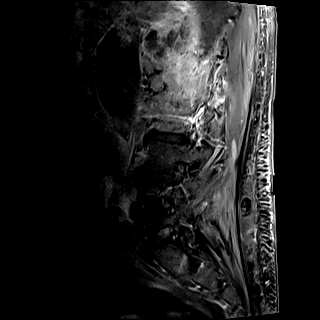
[im 9/17]
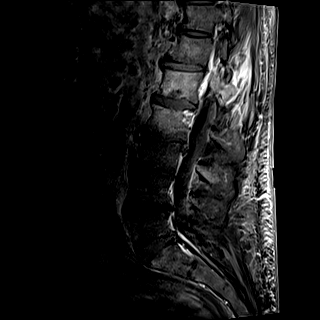
[im 13/17]
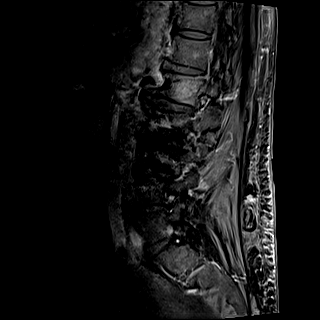
[im 17/17]
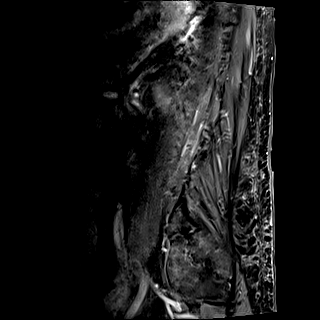

[30 of 48 positions shown; findings below may reference images not displayed]

FINDINGS: Segmentation:  Normal on the comparison CT.

Alignment:  Stable lumbar lordosis since [REDACTED].

Vertebrae: Widespread spinal vertebral metastases. And much of the
bony metastatic disease seems to be poorly enhancing (such as the
expansile lesion in the right L1 posterior elements seen on series
13, image 7 and postcontrast series 31, image 7).

The T12 vertebra appear spared.

The L4 and L5 vertebra appear spared. (There is L3 spinous process
tumor suspected).

Scattered involvement of the visible sacrum and medial iliac bones.

Mild pathologic compression fracture of L2 (series 11, image 8 is
new since [REDACTED].

Conus medullaris and cauda equina: Conus extends to the T12 level.
No lower spinal cord or conus signal abnormality. No abnormal
intradural enhancement. There is mild dural thickening and/or early
epidural tumor along the right posterior thecal sac at L1.

No other lumbar epidural tumor is identified.

The posterior element tumor at L1 is associated with some
extraosseous extension into the muscle, as well as regional erector
spinae muscle edema (series 11, image 8).

Paraspinal and other soft tissues: Abnormal right kidney. Abnormally
thickened and nodular visible right hemidiaphragm.

Bulky gallstones (series 11, image 1). Large benign left renal cyst.
Evidence of generalized intra-abdominal and subcutaneous edema.
Diverticulosis of large bowel in the pelvis.

Disc levels:

Moderate for age lumbar spine degeneration. There is mild
degenerative spinal stenosis (such as at L2-L3).

There is no malignant neural impingement at this time.
IMPRESSION: 1. Widespread osseous metastatic disease in the visible spine and
pelvis. A mild pathologic compression fracture of L2 is new since
[DATE]. Isolated early epidural tumor along the right posterior thecal
sac at L1. No other lumbar epidural tumor. Extraosseous extension of
tumor into the right erector spinae muscle at L1. No malignant
neural impingement at this time.
3. Superimposed lumbar spine degeneration.

## 2020-04-21 IMAGING — MR MR HEAD W/O CM
9 of 10 series · 40 of 48 positions shown · non-contrast
Comparison: Noncontrast head CT [DATE].

CLINICAL DATA: Mental status change, unknown cause. Additional
history obtained from electronic medical record: Patient with
medical history of recently diagnosed renal cell carcinoma
metastatic to bone and lung on chemotherapy.

EXAM:
MRI HEAD WITHOUT CONTRAST
TECHNIQUE: Multiplanar, multiecho pulse sequences of the brain and surrounding
structures were obtained without intravenous contrast.

[Series 2: ax dwi_tracew · axial · 3.0mm · 0.71mm/px · z∈[-63,+97]mm · 8 of 56 slices shown]
[im 1/56]
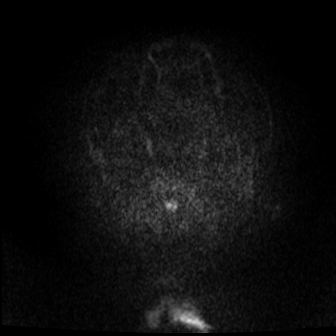
[im 8/56]
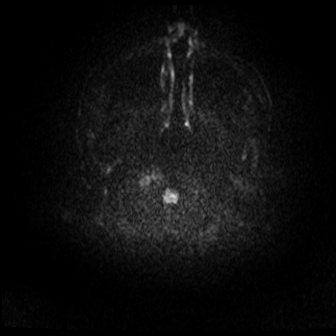
[im 16/56]
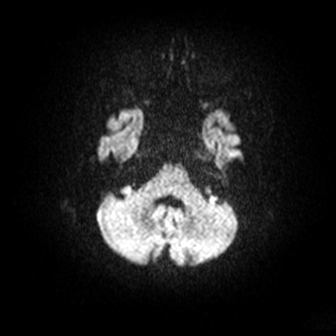
[im 24/56]
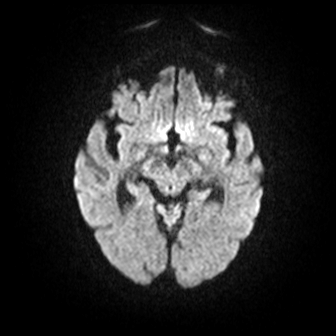
[im 32/56]
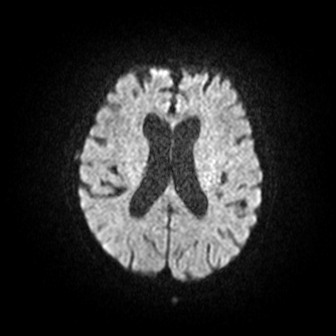
[im 40/56]
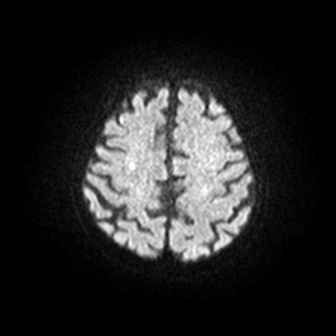
[im 48/56]
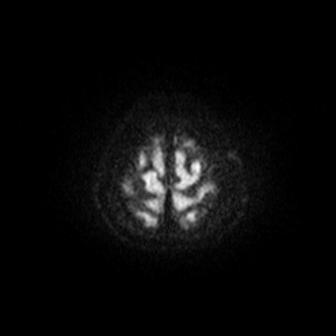
[im 56/56]
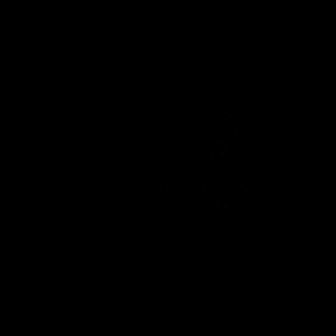

[Series 3: ax dwi_adc · axial · 3.0mm · 0.71mm/px · z∈[-63,+91]mm · 7 of 54 slices shown]
[im 1/54]
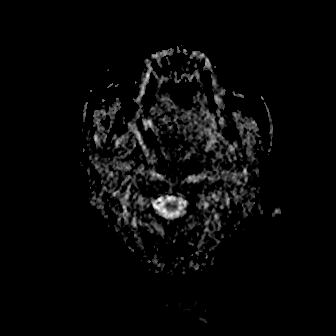
[im 9/54]
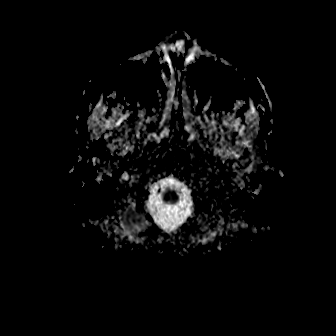
[im 18/54]
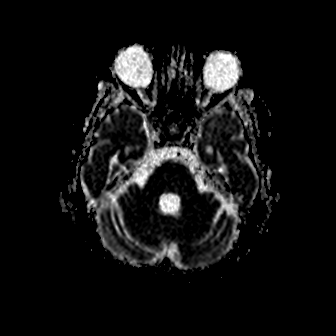
[im 27/54]
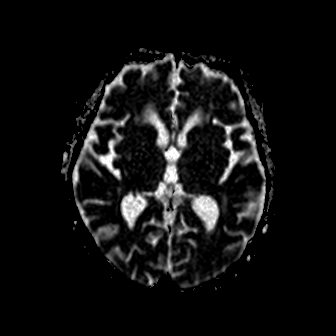
[im 36/54]
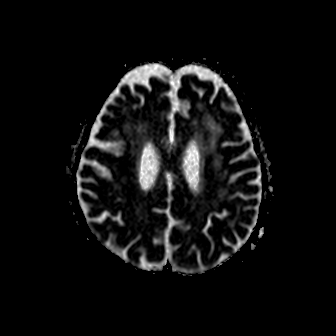
[im 45/54]
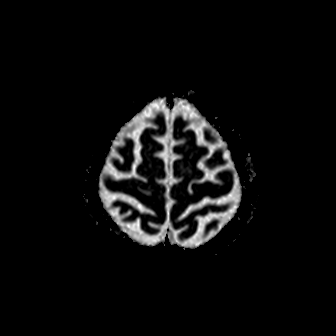
[im 54/54]
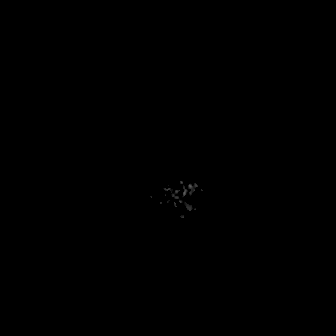

[Series 4: cor dwi_tracew · coronal · 5.0mm · 0.68mm/px · 5 of 40 slices shown]
[im 1/40]
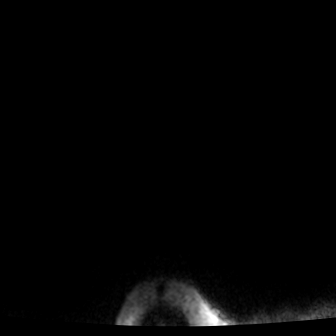
[im 10/40]
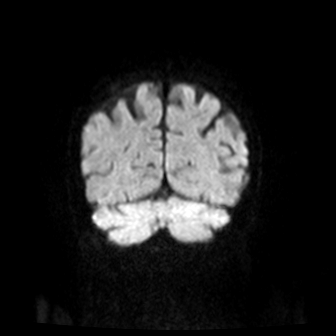
[im 20/40]
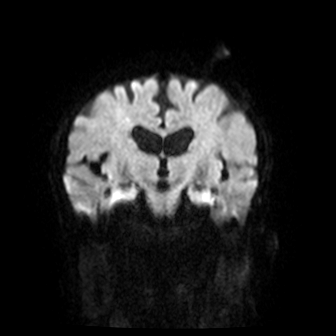
[im 30/40]
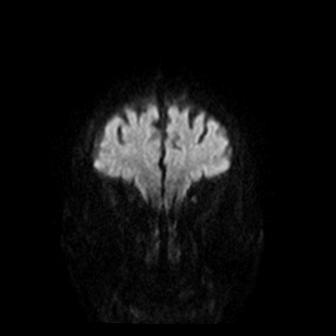
[im 40/40]
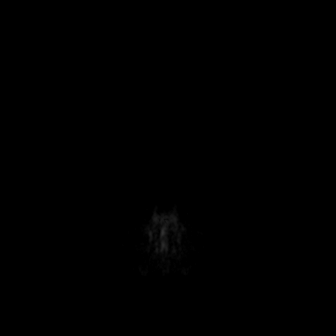

[Series 5: cor dwi_adc · coronal · 5.0mm · 0.68mm/px · 1 of 40 slices shown]
[im 1/40]
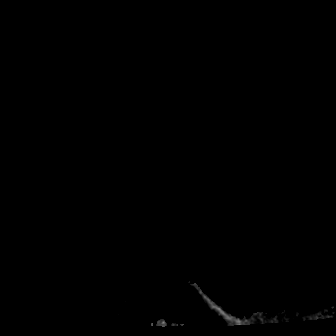

[Series 6: T1 · sagittal · 5.0mm · 0.94mm/px · 3 of 24 slices shown (1 of 2)]
[im 1/24]
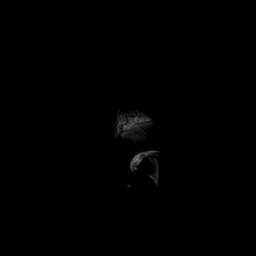
[im 12/24]
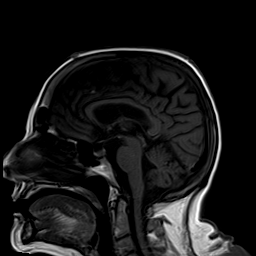
[im 24/24]
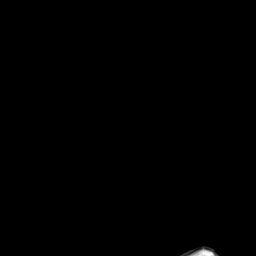

[Series 7: T2 · axial · 5.0mm · 0.45mm/px · z∈[-57,+94]mm · 4 of 27 slices shown (1 of 2)]
[im 1/27]
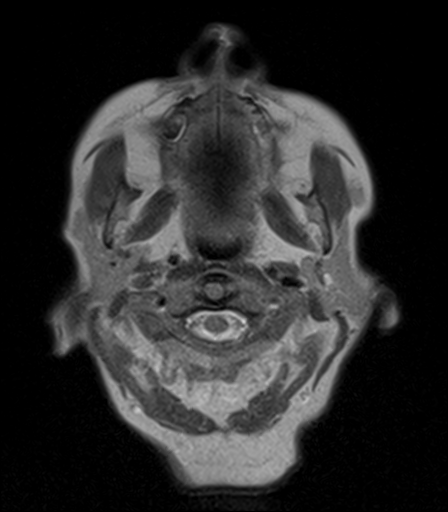
[im 9/27]
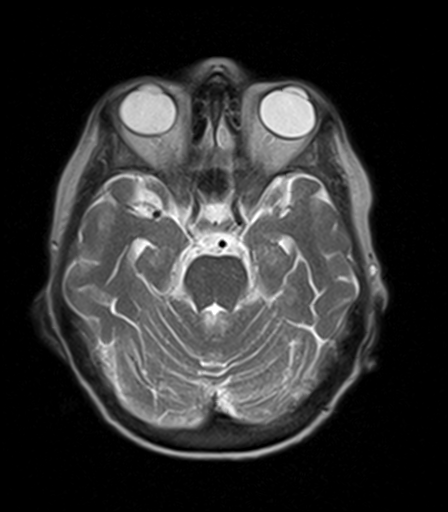
[im 18/27]
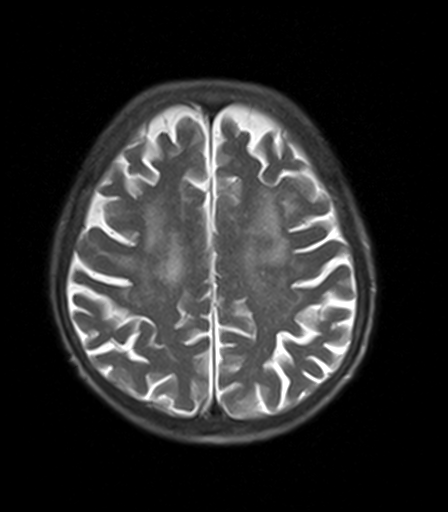
[im 27/27]
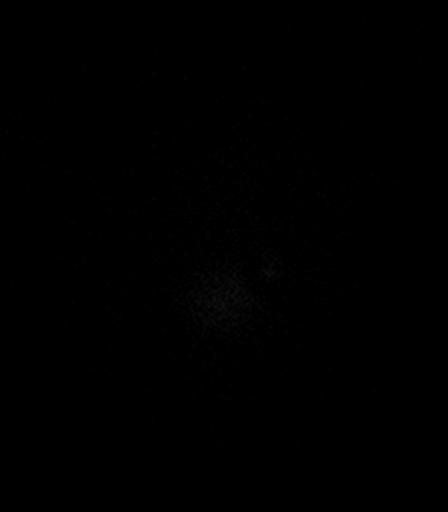

[Series 9: FLAIR · axial · 5.0mm · 1.20mm/px · z∈[-57,+95]mm · 4 of 27 slices shown]
[im 1/27]
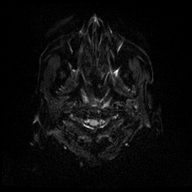
[im 9/27]
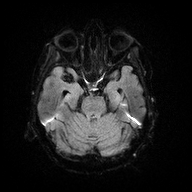
[im 18/27]
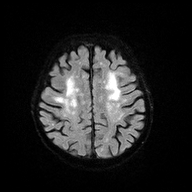
[im 27/27]
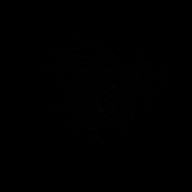

[Series 10: T1 · axial · 5.0mm · 0.90mm/px · z∈[-57,+94]mm · 4 of 27 slices shown (2 of 2)]
[im 1/27]
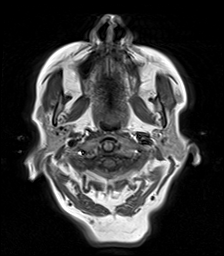
[im 9/27]
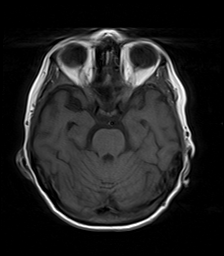
[im 18/27]
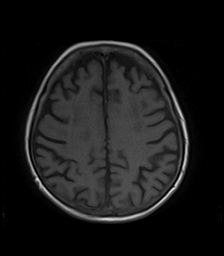
[im 27/27]
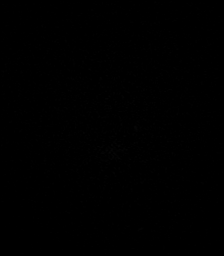

[Series 11: T2 · coronal · 5.0mm · 0.45mm/px · 4 of 31 slices shown (2 of 2)]
[im 1/31]
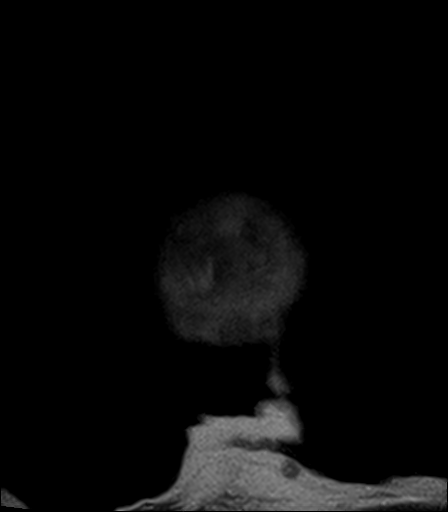
[im 11/31]
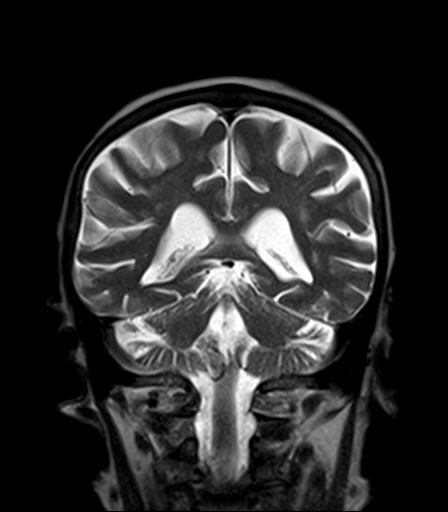
[im 21/31]
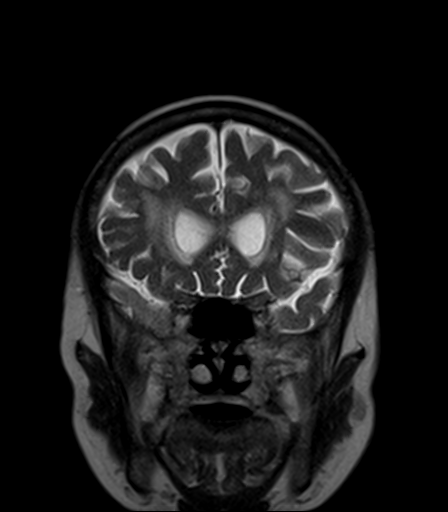
[im 31/31]
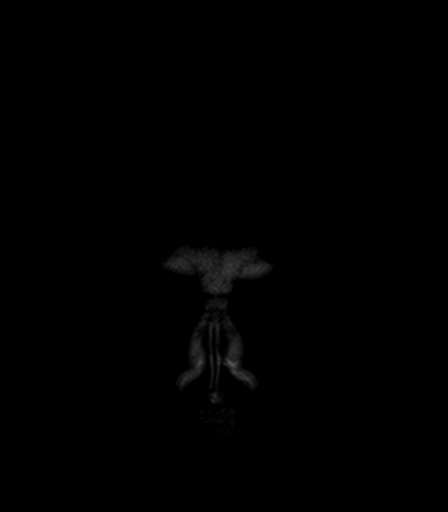

[40 of 48 positions shown; findings below may reference images not displayed]

FINDINGS: Brain:

Stable, mild generalized cerebral atrophy.

There are subcentimeter foci of restricted diffusion within the
posterior right frontal lobe white matter (series 2, image 40),
within the right frontoparietal white matter (series 2, image 38),
within the left parietal lobe (series 2, image 37). An additional
subcentimeter focus of restricted diffusion is questioned within the
left posterior frontal lobe white matter (series 2, image 37).

Advanced multifocal T2/FLAIR hyperintensity within the cerebral
white matter which is nonspecific, but consistent with chronic small
vessel ischemic disease. Chronic lacunar infarcts within the left
basal ganglia and right thalamus.

No chronic intracranial blood products.

No extra-axial fluid collection.

No midline shift.

Vascular: Expected proximal arterial flow voids.

Skull and upper cervical spine: No focal marrow lesion.

Sinuses/Orbits: Visualized orbits show no acute finding. Mild
ethmoid and maxillary sinus mucosal thickening. No significant
mastoid effusion.

Other: Again demonstrated is a 3.2 cm left parietal scalp mass
(series 6, image 17). Also redemonstrated is a subcentimeter nodular
lesion within the midline anterior scalp (series 6, image 12).
IMPRESSION: Subcentimeter foci of restricted diffusion within the right
frontoparietal white matter and left parietal white matter (3 sites
total). An additional subcentimeter focus of restricted diffusion is
questioned within the posterior left frontal lobe white matter.
These foci may reflect acute infarcts and are suspicious for an
embolic process. Given the history of recently diagnosed renal cell
carcinoma, tiny metastases cannot be excluded and follow-up
contrast-enhanced MR imaging should be considered.

Mild generalized cerebral atrophy. Advanced cerebral white matter
chronic small vessel ischemic disease. Chronic lacunar infarcts
within the left basal ganglia and right thalamus.

Redemonstrated 3.2 cm left parietal scalp mass as well as
subcentimeter nodular lesion within the midline anterior scalp.
Correlate with direct visualization and consider tissue sampling if
not already performed.

## 2020-04-21 IMAGING — US US EXTREM LOW VENOUS
1 series · 13 of 24 positions shown · non-contrast
Comparison: CT abdomen pelvis [DATE].

CLINICAL DATA: Bilateral lower extremity edema, concern for deep
venous thrombosis. History of right renal cell carcinoma. No injury.
No pain.

EXAM:
BILATERAL LOWER EXTREMITY VENOUS DOPPLER ULTRASOUND
TECHNIQUE: Gray-scale sonography with compression, as well as color and duplex
ultrasound, were performed to evaluate the deep venous system(s)
from the level of the common femoral vein through the popliteal and
proximal calf veins.

[Series 1: us venous img lower bilat (dvt) · portal-venous · 13 of 58 slices shown]
[im 1/58]
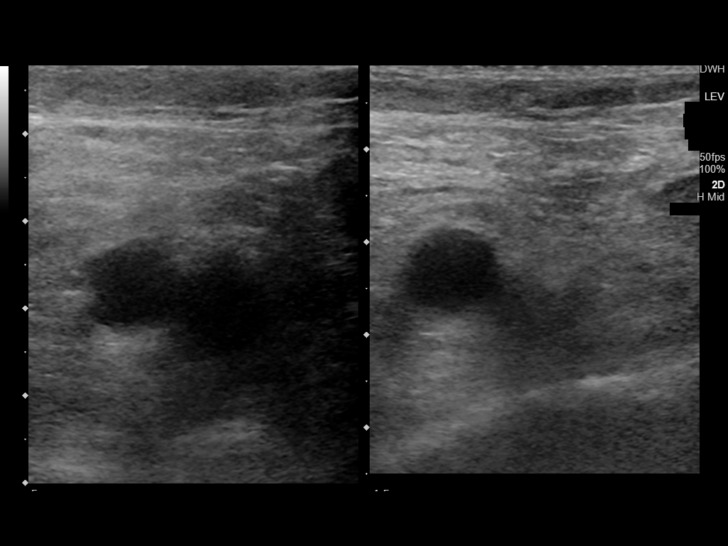
[im 5/58]
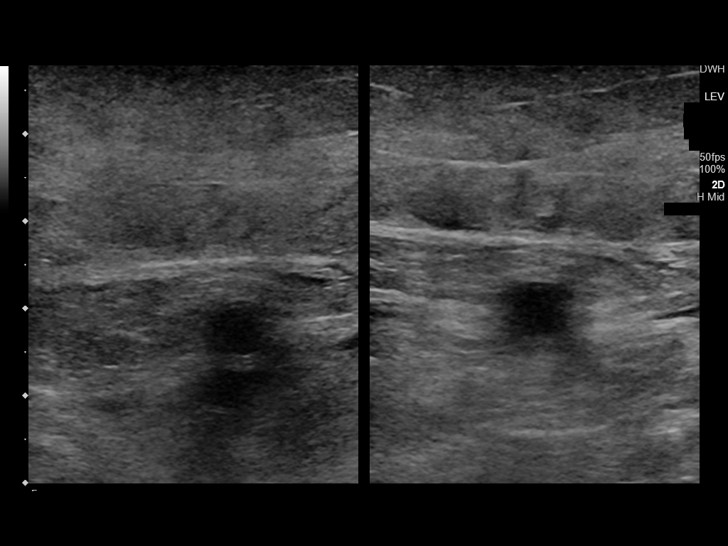
[im 10/58]
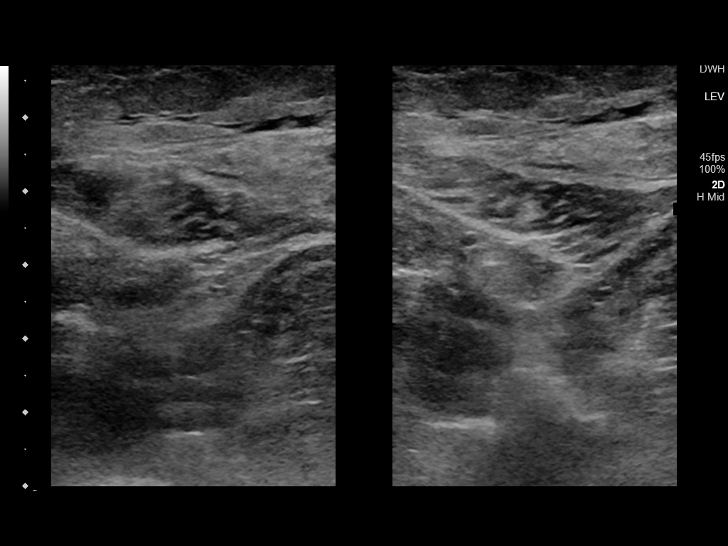
[im 15/58]
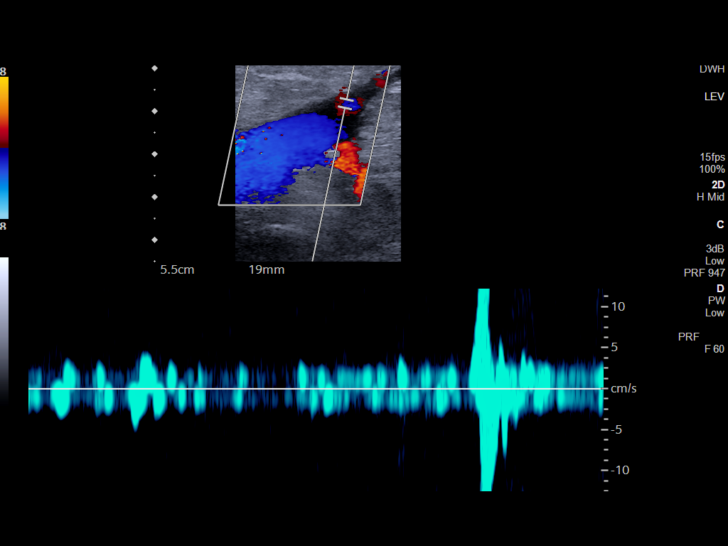
[im 20/58]
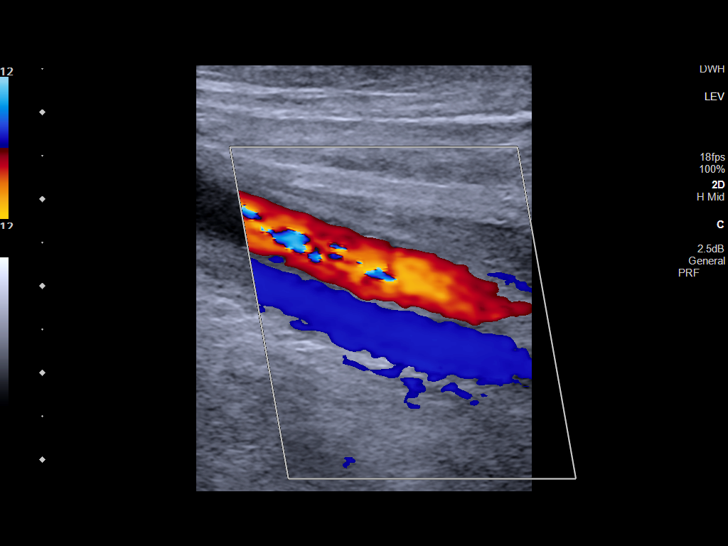
[im 25/58]
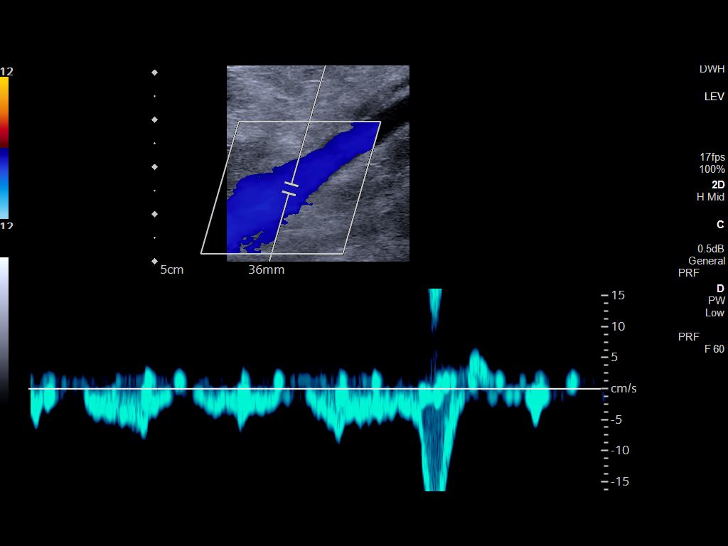
[im 30/58]
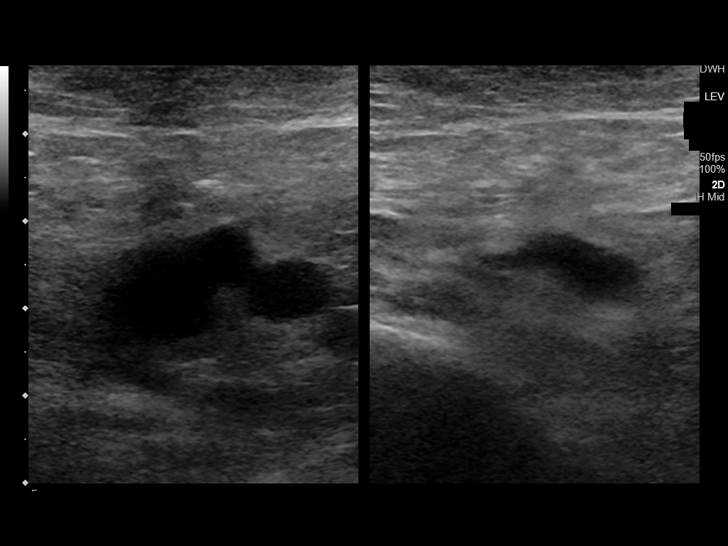
[im 33/58]
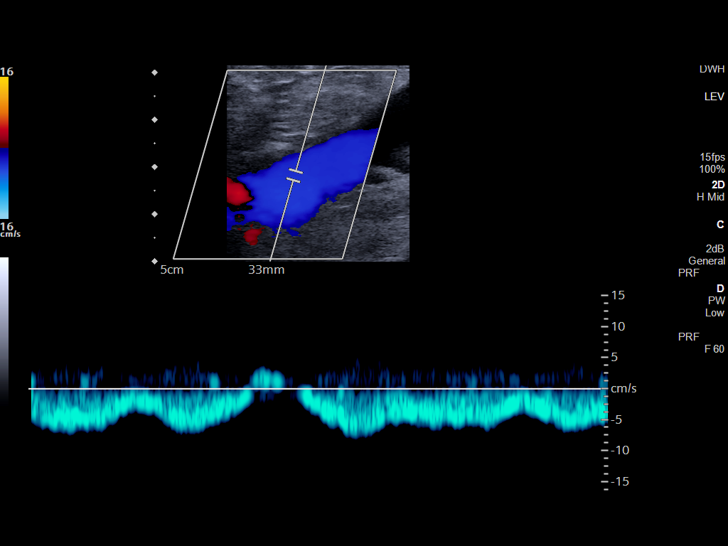
[im 38/58]
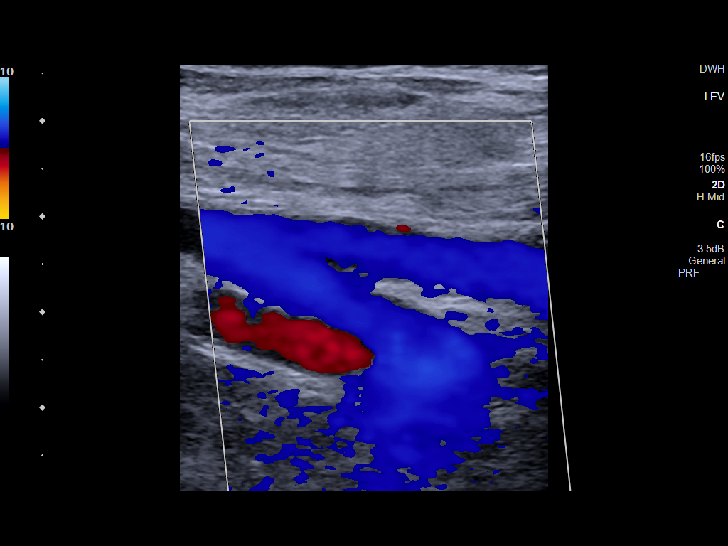
[im 43/58]
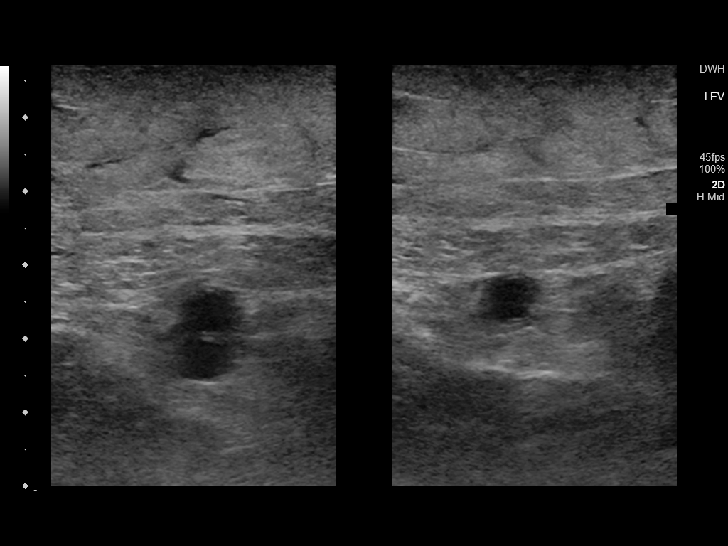
[im 48/58]
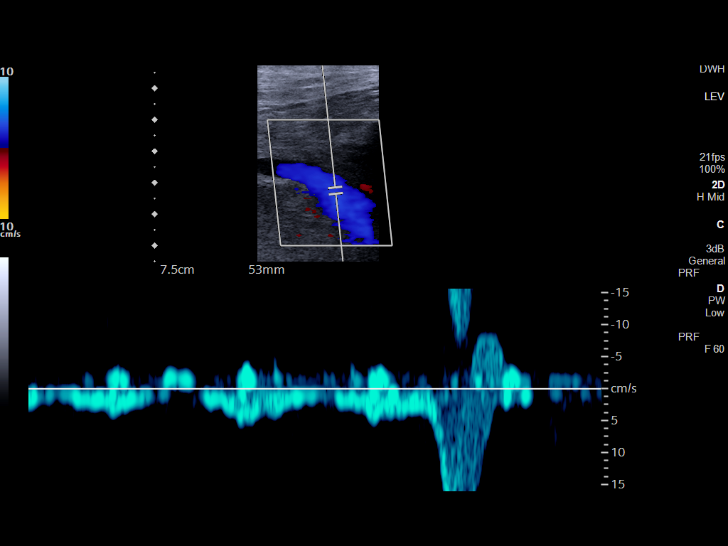
[im 53/58]
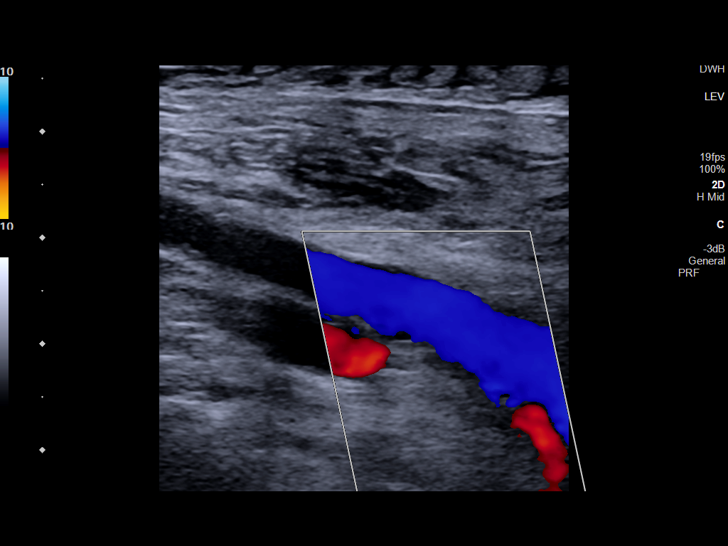
[im 58/58]
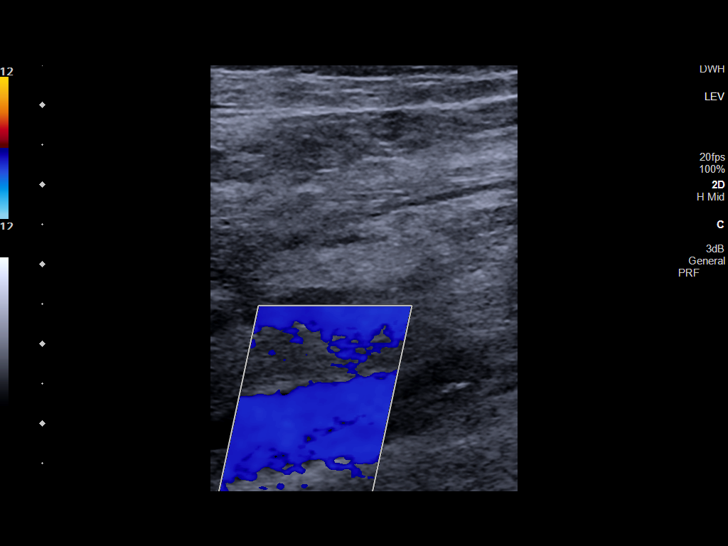

[13 of 24 positions shown; findings below may reference images not displayed]

FINDINGS: VENOUS

Normal compressibility of the common femoral, superficial femoral,
and popliteal veins, as well as the visualized calf veins.
Visualized portions of profunda femoral vein and great saphenous
vein unremarkable. No filling defects to suggest DVT on grayscale or
color Doppler imaging. Doppler waveforms show normal direction of
venous flow, normal respiratory plasticity and response to
augmentation.

Limited views of the contralateral common femoral vein are
unremarkable.

OTHER

None.

Limitations: none
IMPRESSION: No femoropopliteal DVT nor evidence of DVT within the visualized
calf veins. If clinical symptoms are inconsistent or if there are
persistent or worsening symptoms, further imaging (possibly
involving the iliac veins) may be warranted.

## 2020-04-21 MED ORDER — GADOBUTROL 1 MMOL/ML IV SOLN
6.0000 mL | Freq: Once | INTRAVENOUS | Status: AC | PRN
  Administered 2020-04-21: 6 mL via INTRAVENOUS

## 2020-04-21 MED ORDER — ACETAMINOPHEN 650 MG RE SUPP: 650.0000 mg | Freq: Four times a day (QID) | RECTAL | Status: DC | PRN

## 2020-04-21 MED ORDER — ACETAMINOPHEN 325 MG PO TABS: 650.0000 mg | ORAL_TABLET | Freq: Four times a day (QID) | ORAL | Status: DC | PRN

## 2020-04-21 MED ORDER — CLONIDINE HCL 0.1 MG PO TABS
0.1000 mg | ORAL_TABLET | Freq: Once | ORAL | Status: AC
  Administered 2020-04-21: 0.1 mg via ORAL
  Filled 2020-04-21: qty 1

## 2020-04-21 MED ORDER — ENOXAPARIN SODIUM 40 MG/0.4ML ~~LOC~~ SOLN
40.0000 mg | SUBCUTANEOUS | Status: DC
  Administered 2020-04-21 – 2020-04-23 (×3): 40 mg via SUBCUTANEOUS
  Filled 2020-04-21 (×3): qty 0.4

## 2020-04-21 MED ORDER — ONDANSETRON HCL 4 MG PO TABS: 4.0000 mg | ORAL_TABLET | Freq: Four times a day (QID) | ORAL | Status: DC | PRN

## 2020-04-21 MED ORDER — ONDANSETRON HCL 4 MG/2ML IJ SOLN: 4.0000 mg | Freq: Four times a day (QID) | INTRAMUSCULAR | Status: DC | PRN

## 2020-04-21 NOTE — ED Notes (Signed)
Report attempted to floor x1 without success.

## 2020-04-21 NOTE — ED Notes (Signed)
Attempted to call report without success. Per floor pt no longer going to that floor but Oncology instead. RN awaiting new bed assignment and charge RN made aware.

## 2020-04-21 NOTE — ED Notes (Signed)
RN introduced self to patient. Pt assessed at this time. Pt found to be alert with ability to follow commands with repetitive prompting. Pt VS updated and charted. Pt breathing unlabored with symmetric chest rise and fall and speaking in full sentences. Pt pupils equal and reactive bilaterally. Pt requested to void. Pt unable to void with bedpan and requested purewick. Purewick applied. Pt assisted with positioning in bed for comfort. Pt in bed with bed low and locked and siderails raised x2. Call bell in reach. Pt requested TV be turned on and RN complied. Pt also assisted with drinking water. RN to continue to monitor.

## 2020-04-21 NOTE — ED Notes (Signed)
Pt at MRI

## 2020-04-21 NOTE — ED Notes (Signed)
Patient assisted with bedpan and voided ~50 mL of urine. Pt assisted with positioning back in bed for comfort and placed in reverse trendelenburg position with HOB raised ~45 degrees and pillow placed below pt knees and ankles for comfort. Blankets placed back on patient. Pt in bed with bed low and locked and siderails raised x2. Call bell in reach and TV remains on. Pt denies further needs at this time.

## 2020-04-21 NOTE — ED Notes (Signed)
US tech at bedside

## 2020-04-21 NOTE — ED Notes (Signed)
MD aware of PT elevated BP

## 2020-04-21 NOTE — ED Notes (Signed)
Pt is currently resting comfortably at this time. No distress noted. Call bell in reach.

## 2020-04-21 NOTE — ED Notes (Signed)
Hospitalists aware of increasing BP

## 2020-04-21 NOTE — H&P (Signed)
History and Physical    PARLEE AMESCUA YIR:[id number] DOB: [birth year] DOA: 04/20/2020  PCP: Philmore Pali, NP   Patient coming from: Home  I have personally briefly reviewed patient's old medical records in [place]  Chief Complaint: Drowsiness, low back pain, swelling lower extremities  HPI: NEWELL FRATER is a 74 y.o. 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  Patient denies cough fevers or chills shortness of breath.  Denies nausea vomiting diarrhea or abdominal pain.  ED Course: On arrival in the ER she was reportedly intermittently somnolent intermittently alert.  Vitals were within normal limits.  Blood work mostly at baseline.  Potassium slightly elevated at 5.3, hemoglobin 9.6 which is a new baseline, sodium initially low at 129 improving to 132 after IV hydration.  Ammonia level 16.  CT head showed no acute abnormality.  Chest x-ray showed previously seen pulmonary metastatic disease.  Lower extremity Doppler showed no evidence of DVT EKG as reviewed by me : Normal sinus rhythm at 76 with no acute ST-T wave changes Observation admit requested  Review of Systems: Unable to obtain due to somnolence   History reviewed. No pertinent past medical history.  Past Surgical History:  Procedure Laterality Date  . PORTA CATH INSERTION N/A 03/28/2020   Procedure: PORTA CATH INSERTION;  Surgeon: Algernon Huxley, MD;  Location: [facility];  Service: Cardiovascular;  Laterality: N/A;     reports that she has never smoked. She has never used smokeless tobacco. She reports current alcohol use of about 1.0 standard drink of  alcohol per week. She reports previous drug use.  No Known Allergies  History reviewed. No pertinent family history.    Prior to Admission medications   Medication Sig Start Date End Date Taking? Authorizing Provider  axitinib (INLYTA) 5 MG tablet Take 1 tablet (5 mg total) by mouth 2 (two) times daily. 03/22/20   Lequita Asal, MD  HYDROcodone-acetaminophen (NORCO/VICODIN) 5-325 MG tablet Take 1/2 or 1 pill every 6 hours as needed for pain. 04/14/20   Lequita Asal, MD  hydrOXYzine (ATARAX/VISTARIL) 25 MG tablet Take 25 mg by mouth every 8 (eight) hours as needed. 03/31/20   [provider]  lidocaine-prilocaine (EMLA) cream Apply to affected area once Patient not taking: Reported on 04/04/2020 04/01/20   Lequita Asal, MD  ondansetron (ZOFRAN) 8 MG tablet Take 1 tablet (8 mg total) by mouth 2 (two) times daily as needed (Nausea or vomiting). Patient not taking: Reported on 04/04/2020 04/01/20   Lequita Asal, MD  traMADol (ULTRAM) 50 MG tablet Take 0.5 tablets (25 mg total) by mouth every 6 (six) hours as needed for severe pain. 04/20/20   Lequita Asal, MD    Physical Exam: Vitals:   04/21/20 0103 04/21/20 0230 04/21/20 0300 04/21/20 0330  BP: (!) 197/80 (!) 193/79 (!) 173/74 (!) 169/71  Pulse: 77 77 72 71  Resp:      Temp:      TempSrc:      SpO2: 94% 97% 96% 94%  Weight:      Height:         Vitals:  04/21/20 0103 04/21/20 0230 04/21/20 0300 04/21/20 0330  BP: (!) 197/80 (!) 193/79 (!) 173/74 (!) 169/71  Pulse: 77 77 72 71  Resp:      Temp:      TempSrc:      SpO2: 94% 97% 96% 94%  Weight:      Height:          Constitutional:  Somnolent, arousable to persistentgentle shaking but will readily fall back asleep.  Will not stay awake long enough to answer questions on orientation. Not in any apparent distress HEENT:      Head: Normocephalic and atraumatic.         Eyes: PERLA, EOMI, Conjunctivae are normal. Sclera is non-icteric.        Mouth/Throat: Mucous membranes are moist.       Neck: Supple with no signs of meningismus. Cardiovascular: Regular rate and rhythm. No murmurs, gallops, or rubs. 2+ symmetrical distal pulses are present . No JVD. No 2+LE edema Respiratory: Respiratory effort normal .Lungs sounds clear bilaterally. No wheezes, crackles, or rhonchi.  Gastrointestinal: Soft, non tender, and non distended with positive bowel sounds. No rebound or guarding. Genitourinary: No CVA tenderness. Musculoskeletal: Nontender with normal range of motion in all extremities. No cyanosis, or erythema of extremities.  2+ pitting edema bilaterally Neurologic:  Face is symmetric. Moving all extremities. No gross focal neurologic deficits . Skin: Skin is warm, dry.  No rash or ulcers Psychiatric: Unable to assess due to somnolence   Labs on Admission: I have personally reviewed following labs and imaging studies  CBC: Recent Labs  Lab 04/14/20 0950 04/20/20 1528 04/20/20 1813  WBC 6.0 8.1 9.0  NEUTROABS 4.6 5.7  --   HGB 8.5* 9.2* 9.6*  HCT 29.2* 30.5* 31.5*  MCV 85.6 82.9 82.2  PLT 197 142* 735   Basic Metabolic Panel: Recent Labs  Lab 04/14/20 0950 04/20/20 1528 04/20/20 1813  NA 134* 129* 132*  K 4.8 5.5* 5.3*  CL 106 101 102  CO2 19* 20* 20*  GLUCOSE 128* 137* 114*  BUN 23 22 21   CREATININE 1.12* 0.91 0.85  CALCIUM 8.2* 7.8* 8.2*   GFR: Estimated Creatinine Clearance: 52 mL/min (by C-G formula based on SCr of 0.85 mg/dL). Liver Function Tests: Recent Labs  Lab 04/14/20 0950 04/20/20 1528 04/20/20 1813  AST 24 56* 58*  ALT 9 12 13   ALKPHOS 96 118 127*  BILITOT 0.7 0.8 1.2  PROT 7.1 6.7 7.0  ALBUMIN 2.2* 2.0* 2.2*   Recent Labs  Lab 04/20/20 1813  LIPASE 26   Recent Labs  Lab 04/21/20 0300  AMMONIA 16   Coagulation Profile: No results for input(s): INR, PROTIME in the last 168 hours. Cardiac Enzymes: No results for input(s): CKTOTAL, CKMB, CKMBINDEX, TROPONINI in the last 168  hours. BNP (last 3 results) No results for input(s): PROBNP in the last 8760 hours. HbA1C: No results for input(s): HGBA1C in the last 72 hours. CBG: No results for input(s): GLUCAP in the last 168 hours. Lipid Profile: No results for input(s): CHOL, HDL, LDLCALC, TRIG, CHOLHDL, LDLDIRECT in the last 72 hours. Thyroid Function Tests: No results for input(s): TSH, T4TOTAL, FREET4, T3FREE, THYROIDAB in the last 72 hours. Anemia Panel: No results for input(s): VITAMINB12, FOLATE, FERRITIN, TIBC, IRON, RETICCTPCT in the last 72 hours. Urine analysis:    Component Value Date/Time   COLORURINE YELLOW (A) 04/20/2020 1813   APPEARANCEUR HAZY (A) 04/20/2020 1813   LABSPEC 1.006 04/20/2020 1813   PHURINE 5.0 04/20/2020  [place] 04/20/2020 1813   HGBUR SMALL (A) 04/20/2020 1813   BILIRUBINUR NEGATIVE 04/20/2020 1813   KETONESUR NEGATIVE 04/20/2020 1813   PROTEINUR NEGATIVE 04/20/2020 1813   NITRITE NEGATIVE 04/20/2020 1813   LEUKOCYTESUR NEGATIVE 04/20/2020 1813    Radiological Exams on Admission: DG Chest 2 View  Result Date: 04/20/2020 CLINICAL DATA:  74 year old female with weakness. Bilateral lower extremity swelling. Metastatic renal cell carcinoma. EXAM: CHEST - 2 VIEW COMPARISON:  Chest CT 03/03/2020 and earlier. FINDINGS: Lower lung volumes. Right chest power port. Stable mediastinal contours. Small bilateral pleural effusions. Increased bilateral pulmonary interstitial opacity, with nodular pulmonary metastasis most visible at the left lung base. Paucity of bowel gas in the upper abdomen. Degenerative osseous changes. No destructive osseous lesion identified radiographically. IMPRESSION: 1. Pulmonary metastatic disease re-demonstrated with new small pleural effusions since August. 2. Paucity of bowel gas in the upper abdomen.  Query ascites. Electronically Signed   By: Genevie Ann M.D.   On: 04/20/2020 22:24   CT Head Wo Contrast  Result Date: 04/20/2020 CLINICAL DATA:   Mental status changes, history of renal cell carcinoma EXAM: CT HEAD WITHOUT CONTRAST TECHNIQUE: Contiguous axial images were obtained from the base of the skull through the vertex without intravenous contrast. COMPARISON:  02/26/2020 FINDINGS: Brain: Mild atrophic changes and chronic white matter ischemic changes are seen stable from the prior exam. No findings to suggest acute hemorrhage, acute infarction or space-occupying mass lesion are noted. Vascular: No hyperdense vessel or unexpected calcification. Skull: Normal. Negative for fracture or focal lesion. Sinuses/Orbits: No acute finding. Other: Scalp soft tissue nodule is noted on the left near the vertex stable from the prior exam. IMPRESSION: Chronic atrophic and ischemic changes without acute abnormality. Stable soft tissue scalp lesion near the vertex on the left. Smaller nodule is noted in the midline anteriorly. Electronically Signed   By: Inez Catalina M.D.   On: 04/20/2020 23:03   US Venous Img Lower Bilateral  Result Date: 04/21/2020 CLINICAL DATA:  Bilateral lower extremity edema, concern for deep venous thrombosis. History of right renal cell carcinoma. No injury. No pain. EXAM: BILATERAL LOWER EXTREMITY VENOUS DOPPLER ULTRASOUND TECHNIQUE: Gray-scale sonography with compression, as well as color and duplex ultrasound, were performed to evaluate the deep venous system(s) from the level of the common femoral vein through the popliteal and proximal calf veins. COMPARISON:  CT abdomen pelvis 03/04/2020. FINDINGS: VENOUS Normal compressibility of the common femoral, superficial femoral, and popliteal veins, as well as the visualized calf veins. Visualized portions of profunda femoral vein and great saphenous vein unremarkable. No filling defects to suggest DVT on grayscale or color Doppler imaging. Doppler waveforms show normal direction of venous flow, normal respiratory plasticity and response to augmentation. Limited views of the contralateral  common femoral vein are unremarkable. OTHER None. Limitations: none IMPRESSION: No femoropopliteal DVT nor evidence of DVT within the visualized calf veins. If clinical symptoms are inconsistent or if there are persistent or worsening symptoms, further imaging (possibly involving the iliac veins) may be warranted. Electronically Signed   By: Iven Finn M.D.   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    Somnolence -Suspect medication related.  Patient discontinued hydrocodone a couple  days prior but somnolence persists -CT head with no acute intracranial findings or evidence of metastases -Ammonia level within normal limits -Continue observation.  Neurochecks every 4    Bilateral lower extremity edema -Lower extremity Doppler negative for DVT -Patient got a trial of IV Lasix in the ER    Renal cell carcinoma of right kidney ([place])   Bone metastases ([place]) -Oncology consult for further recommendations    Hypertension -Continue home meds    Hyperkalemia -Improved with IV hydration and Lasix given in the emergency room -Continue to monitor    Low back pain -Nonnarcotic pain meds for now while patient still somnolent    DVT prophylaxis: Lovenox  Code Status: full code  Family Communication:  none  Disposition Plan: Back to previous home environment Consults called: oncology  Status: Observation    Athena Masse MD [facility]     04/21/2020, 3:46 AM

## 2020-04-21 NOTE — Consult Note (Signed)
[facility]  Date of admission:  04/21/2020  Inpatient day:  04/21/2020  Consulting physician: Dr. Judd Gaudier  Reason for Consultation:  Somnolence  Chief Complaint: Mariah Thornton is a 74 y.o. female with metastatic renal cell carcinoma who was admitted through the emergency room with somnolence.  HPI: The patient was diagnosed with metastatic renal cell carcinoma on 03/08/2020.  She presented with altered mental status secondary to hypercalcemia.  Imaging studies revealed a large right renal mass, thoracic and retroperitoneal adenopathy, multiple pulmonary nodules, lytic bone lesions and SQ masses.  She began cycle #1 pembrolizumab on 04/07/2020 and axitinib on 04/15/2020.  The patient was last seen in the medical oncology clinic on 04/20/2020. At that time, she was groggy after taking hydrocodone/acetaminophen 5/325 every 6 hours. Blood pressure was high (206/166; repeat 181/74).  She had lower extremity edema.  Hematocrit was 30.5, hemoglobin 9.2, MCV 82.9, platelets 142,000, WBC 8,100. Sodium was 129 and potassium 5.5.  Calcium was 7.8 with an albumin of 2.0 (corrected calcium 9.5).  She was referred to the [facility] ER for evaluation of somnolence, hypertension, and electrolyte abnormalities (hyponatremia and hyperkalemia).    CXR revealed pulmonary metastatic disease re-demonstrated with new small pleural effusions since 02/2020.  Bilateral lower extremity duplex revealed no DVT.  Head CT without contrast revealed chronic atrophic and ischemic changes without acute abnormality.  There was stable soft tissue scalp lesion near the vertex on the left.  A smaller nodule was noted in the midline anteriorly.  Head MRI on 04/21/2020 revealed no evidence of cerebral metastasis.  There was no enhancement of the small DWI lesions and was thus compatible with multiple small infarcts.  There was an irregular left vertex enhancing soft scalp mass.  Lumbar spine MRI on 04/21/2020  revealed widespread osseous metastatic disease in the visible spine and pelvis. There was a mild pathologic compression fracture of L2 (new since 02/2020).  There was an isolated early epidural tumor along the right posterior thecal sac at L1. There was  extraosseous extension of tumor into the right erector spinae muscle at L1. There was no malignant neural impingement at this time.  Symptomatically, she feels "hungry".  She is aware of the date, president, and recent events.  She denies any focal numbness or weakness.  He husband states that she is easily falls to sleep.  Pain is localized to her groin and lower back.   History reviewed. No pertinent past medical history.  Past Surgical History:  Procedure Laterality Date  . PORTA CATH INSERTION N/A 03/28/2020   Procedure: PORTA CATH INSERTION;  Surgeon: Algernon Huxley, MD;  Location: [facility];  Service: Cardiovascular;  Laterality: N/A;    History reviewed. No pertinent family history.  Social History:  reports that she has never smoked. She has never used smokeless tobacco. She reports current alcohol use of about 1.0 standard drink of alcohol per week. She reports previous drug use.  The patient denies any exposure to radiation or toxins.  The patient lives in [place].  She is accompanied by her husband, [name].  Allergies: No Known Allergies  (Not in a hospital admission)  Review of Systems: GENERAL:  Fatigue.  No fevers, sweats or weight loss. PERFORMANCE STATUS (ECOG):  2-3 HEENT:  Sensitive to light.  No visual changes, runny nose, sore throat, mouth sores or tenderness. Lungs: No shortness of breath or cough.  No hemoptysis. Cardiac:  No chest pain, palpitations, orthopnea, or PND. GI:  Constipation.  No nausea, vomiting, diarrhea,melena or hematochezia. GU:  No urgency, frequency, dysuria, or hematuria. Musculoskeletal:  Back pain.  No joint pain.  No muscle tenderness. Extremities:  No pain or swelling. Skin:  No  rashes or skin changes. Neuro:  No headache, numbness or weakness, balance or coordination issues. Endocrine:  No diabetes, thyroid issues, hot flashes or night sweats. Psych:  No mood changes, depression or anxiety. Pain:  Back and groin pain. Review of systems:  All other systems reviewed and found to be negative.  Physical Exam:  Blood pressure (!) 174/77, pulse 74, temperature 97.7 F (36.5 C), temperature source Axillary, resp. rate 20, height 5\' 2"  (1.575 m), weight 151 lb 11.2 oz (68.8 kg), SpO2 93 %.  GENERAL:  Fatigued appearing woman lying comfortably in the emergency room in no acute distress. MENTAL STATUS:  Alert and oriented to person, place and time. HEAD:  Pearline Cables hair.  Normocephalic, atraumatic, face symmetric, no Cushingoid features. EYES:  Pupils equal round and reactive to light and accomodation.  No conjunctivitis or scleral icterus. ENT:  Oropharynx clear without lesion.  Tongue normal. Mucous membranes moist.  NECK:  Base of neck mass 7 x 4.5 cm (smaller). RESPIRATORY:  Clear to auscultation without rales, wheezes or rhonchi. CARDIOVASCULAR:  Regular rate and rhythm without murmur, rub or gallop. ABDOMEN:  Soft, non-tender, with active bowel sounds, and no hepatosplenomegaly.  No masses. SKIN:  Cutaneous lesions on scalp (less ruddy).  No rashes, ulcers or lesions. EXTREMITIES:Bilateral lower extremity edema.  Noskin discoloration or tenderness.  No palpable cords. NEUROLOGICAL: Interactive, but easily falls asleep.  Follows commands.  Strength and sensation symmertric. PSYCH:  Appropriate.   Results for orders placed or performed during the hospital encounter of 04/20/20 (from the past 48 hour(s))  Lipase, blood     Status: None   Collection Time: 04/20/20  6:13 PM  Result Value Ref Range   Lipase 26 11 - 51 U/L    Comment: Performed at [facility], [address] [place], [place] [address]  Comprehensive metabolic panel     Status: Abnormal    Collection Time: 04/20/20  6:13 PM  Result Value Ref Range   Sodium 132 (L) 135 - 145 mmol/L   Potassium 5.3 (H) 3.5 - 5.1 mmol/L   Chloride 102 98 - 111 mmol/L   CO2 20 (L) 22 - 32 mmol/L   Glucose, Bld 114 (H) 70 - 99 mg/dL    Comment: Glucose reference range applies only to samples taken after fasting for at least 8 hours.   BUN 21 8 - 23 mg/dL   Creatinine, Ser 0.85 0.44 - 1.00 mg/dL   Calcium 8.2 (L) 8.9 - 10.3 mg/dL   Total Protein 7.0 6.5 - 8.1 g/dL   Albumin 2.2 (L) 3.5 - 5.0 g/dL   AST 58 (H) 15 - 41 U/L   ALT 13 0 - 44 U/L   Alkaline Phosphatase 127 (H) 38 - 126 U/L   Total Bilirubin 1.2 0.3 - 1.2 mg/dL   GFR, Estimated >60 >60 mL/min   Anion gap 10 5 - 15    Comment: Performed at [facility], [address] [place], [place] [address]  CBC     Status: Abnormal   Collection Time: 04/20/20  6:13 PM  Result Value Ref Range   WBC 9.0 4.0 - 10.5 K/uL   RBC 3.83 (L) 3.87 - 5.11 MIL/uL   Hemoglobin 9.6 (L) 12.0 - 15.0 g/dL   HCT 31.5 (L)  36 - 46 %   MCV 82.2 80.0 - 100.0 fL   MCH 25.1 (L) 26.0 - 34.0 pg   MCHC 30.5 30.0 - 36.0 g/dL   RDW 18.3 (H) 11.5 - 15.5 %   Platelets 158 150 - 400 K/uL   nRBC 0.0 0.0 - 0.2 %    Comment: Performed at [facility], [address] [place], [place] [address]  Urinalysis, Complete w Microscopic     Status: Abnormal   Collection Time: 04/20/20  6:13 PM  Result Value Ref Range   Color, Urine YELLOW (A) YELLOW   APPearance HAZY (A) CLEAR   Specific Gravity, Urine 1.006 1.005 - 1.030   pH 5.0 5.0 - 8.0   Glucose, UA NEGATIVE NEGATIVE mg/dL   Hgb urine dipstick SMALL (A) NEGATIVE   Bilirubin Urine NEGATIVE NEGATIVE   Ketones, ur NEGATIVE NEGATIVE mg/dL   Protein, ur NEGATIVE NEGATIVE mg/dL   Nitrite NEGATIVE NEGATIVE   Leukocytes,Ua NEGATIVE NEGATIVE   RBC / HPF 0-5 0 - 5 RBC/hpf   WBC, UA 6-10 0 - 5 WBC/hpf   Bacteria, UA RARE (A) NONE SEEN   Squamous Epithelial / LPF NONE SEEN 0 - 5   Mucus PRESENT     Hyaline Casts, UA PRESENT     Comment: Performed at [facility], [address] [place], [place] [address]  Ammonia     Status: None   Collection Time: 04/21/20  3:00 AM  Result Value Ref Range   Ammonia 16 9 - 35 umol/L    Comment: Performed at [facility], [address] [place], [place] [address]  Respiratory Panel by RT PCR (Flu A&B, Covid) - Nasopharyngeal Swab     Status: None   Collection Time: 04/21/20  7:40 AM   Specimen: Nasopharyngeal Swab  Result Value Ref Range   SARS Coronavirus 2 by RT PCR NEGATIVE NEGATIVE    Comment: (NOTE) SARS-CoV-2 target nucleic acids are NOT DETECTED.  The SARS-CoV-2 RNA is generally detectable in upper respiratoy specimens during the acute phase of infection. The lowest concentration of SARS-CoV-2 viral copies this assay can detect is 131 copies/mL. A negative result does not preclude SARS-Cov-2 infection and should not be used as the sole basis for treatment or other patient management decisions. A negative result may occur with  improper specimen collection/handling, submission of specimen other than nasopharyngeal swab, presence of viral mutation(s) within the areas targeted by this assay, and inadequate number of viral copies (<131 copies/mL). A negative result must be combined with clinical observations, patient history, and epidemiological information. The expected result is Negative.  Fact Sheet for Patients:  PinkCheek.be  Fact Sheet for Healthcare Providers:  GravelBags.it  This test is no t yet approved or cleared by the [place] FDA and  has been authorized for detection and/or diagnosis of SARS-CoV-2 by FDA under an Emergency Use Authorization (EUA). This EUA will remain  in effect (meaning this test can be used) for the duration of the COVID-19 declaration under Section 564(b)(1) of the Act, 21 U.S.C. section 360bbb-3(b)(1), unless the  authorization is terminated or revoked sooner.     Influenza A by PCR NEGATIVE NEGATIVE   Influenza B by PCR NEGATIVE NEGATIVE    Comment: (NOTE) The Xpert Xpress SARS-CoV-2/FLU/RSV assay is intended as an aid in  the diagnosis of influenza from Nasopharyngeal swab specimens and  should not be used as a sole basis for treatment. Nasal washings and  aspirates are unacceptable for Xpert  Xpress SARS-CoV-2/FLU/RSV  testing.  Fact Sheet for Patients: PinkCheek.be  Fact Sheet for Healthcare Providers: GravelBags.it  This test is not yet approved or cleared by the [place] FDA and  has been authorized for detection and/or diagnosis of SARS-CoV-2 by  FDA under an Emergency Use Authorization (EUA). This EUA will remain  in effect (meaning this test can be used) for the duration of the  Covid-19 declaration under Section 564(b)(1) of the Act, 21  U.S.C. section 360bbb-3(b)(1), unless the authorization is  terminated or revoked. Performed at [facility], [address] [place], [place] [address]    DG Chest 2 View  Result Date: 04/20/2020 CLINICAL DATA:  74 year old female with weakness. Bilateral lower extremity swelling. Metastatic renal cell carcinoma. EXAM: CHEST - 2 VIEW COMPARISON:  Chest CT 03/03/2020 and earlier. FINDINGS: Lower lung volumes. Right chest power port. Stable mediastinal contours. Small bilateral pleural effusions. Increased bilateral pulmonary interstitial opacity, with nodular pulmonary metastasis most visible at the left lung base. Paucity of bowel gas in the upper abdomen. Degenerative osseous changes. No destructive osseous lesion identified radiographically. IMPRESSION: 1. Pulmonary metastatic disease re-demonstrated with new small pleural effusions since August. 2. Paucity of bowel gas in the upper abdomen.  Query ascites. Electronically Signed   By: Genevie Ann M.D.   On: 04/20/2020 22:24   CT  Head Wo Contrast  Result Date: 04/20/2020 CLINICAL DATA:  Mental status changes, history of renal cell carcinoma EXAM: CT HEAD WITHOUT CONTRAST TECHNIQUE: Contiguous axial images were obtained from the base of the skull through the vertex without intravenous contrast. COMPARISON:  02/26/2020 FINDINGS: Brain: Mild atrophic changes and chronic white matter ischemic changes are seen stable from the prior exam. No findings to suggest acute hemorrhage, acute infarction or space-occupying mass lesion are noted. Vascular: No hyperdense vessel or unexpected calcification. Skull: Normal. Negative for fracture or focal lesion. Sinuses/Orbits: No acute finding. Other: Scalp soft tissue nodule is noted on the left near the vertex stable from the prior exam. IMPRESSION: Chronic atrophic and ischemic changes without acute abnormality. Stable soft tissue scalp lesion near the vertex on the left. Smaller nodule is noted in the midline anteriorly. Electronically Signed   By: Inez Catalina M.D.   On: 04/20/2020 23:03   MR BRAIN WO CONTRAST  Result Date: 04/21/2020 CLINICAL DATA:  Mental status change, unknown cause. Additional history obtained from [place] with medical history of recently diagnosed renal cell carcinoma metastatic to bone and lung on chemotherapy. EXAM: MRI HEAD WITHOUT CONTRAST TECHNIQUE: Multiplanar, multiecho pulse sequences of the brain and surrounding structures were obtained without intravenous contrast. COMPARISON:  Noncontrast head CT 04/20/2020. FINDINGS: Brain: Stable, mild generalized cerebral atrophy. There are subcentimeter foci of restricted diffusion within the posterior right frontal lobe white matter (series 2, image 40), within the right frontoparietal white matter (series 2, image 38), within the left parietal lobe (series 2, image 37). An additional subcentimeter focus of restricted diffusion is questioned within the left posterior frontal lobe white matter (series  2, image 37). Advanced multifocal T2/FLAIR hyperintensity within the cerebral white matter which is nonspecific, but consistent with chronic small vessel ischemic disease. 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 No chronic intracranial blood products. No extra-axial fluid collection. No midline shift. Vascular: Expected proximal arterial flow voids. Skull and upper cervical spine: No focal marrow lesion. Sinuses/Orbits: Visualized orbits show no acute finding. Mild ethmoid and maxillary sinus mucosal thickening. No significant mastoid effusion. Other:  Again demonstrated is a 3.2 cm left parietal scalp mass (series 6, image 17). Also redemonstrated is a subcentimeter nodular lesion within the midline anterior scalp (series 6, image 12). IMPRESSION: Subcentimeter foci of restricted diffusion within the right frontoparietal white matter and left parietal white matter (3 sites total). An additional subcentimeter focus of restricted diffusion is questioned within the posterior left frontal lobe white matter. These foci may reflect acute infarcts and are suspicious for an embolic process. Given the history of recently diagnosed renal cell carcinoma, tiny metastases cannot be excluded and follow-up contrast-enhanced MR imaging should be considered. Mild generalized cerebral atrophy. Advanced cerebral white matter chronic small vessel ischemic disease. 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 Redemonstrated 3.2 cm left parietal scalp mass as well as subcentimeter nodular lesion within the midline anterior scalp. Correlate with direct visualization and consider tissue sampling if not already performed. Electronically Signed   By: Kellie Simmering DO   On: 04/21/2020 11:39   US Venous Img Lower Bilateral  Result Date: 04/21/2020 CLINICAL DATA:  Bilateral lower extremity edema, concern for deep venous thrombosis. History of right renal cell carcinoma. No  injury. No pain. EXAM: BILATERAL LOWER EXTREMITY VENOUS DOPPLER ULTRASOUND TECHNIQUE: Gray-scale sonography with compression, as well as color and duplex ultrasound, were performed to evaluate the deep venous system(s) from the level of the common femoral vein through the popliteal and proximal calf veins. COMPARISON:  CT abdomen pelvis 03/04/2020. FINDINGS: VENOUS Normal compressibility of the common femoral, superficial femoral, and popliteal veins, as well as the visualized calf veins. Visualized portions of profunda femoral vein and great saphenous vein unremarkable. No filling defects to suggest DVT on grayscale or color Doppler imaging. Doppler waveforms show normal direction of venous flow, normal respiratory plasticity and response to augmentation. Limited views of the contralateral common femoral vein are unremarkable. OTHER None. Limitations: none IMPRESSION: No femoropopliteal DVT nor evidence of DVT within the visualized calf veins. If clinical symptoms are inconsistent or if there are persistent or worsening symptoms, further imaging (possibly involving the iliac veins) may be warranted. Electronically Signed   By: Iven Finn M.D.   On: 04/21/2020 02:14    Assessment:  The patient is a 75 y.o. woman with metastatic renal cell carcinoma. She is day 15 s/p cycle #1 pembrolizumab and day 7 axitinib.  She was admitted with somnolence, hypertension, and electrolyte abnormalities (hyponatremia and hyperkalemia).    Bilateral lower extremity duplex revealed no DVT.  Head CT without contrast revealed chronic atrophic and ischemic changes without acute abnormality.  There was stable soft tissue scalp lesion near the vertex on the left.  A smaller nodule was noted in the midline anteriorly.  Head MRI on 04/21/2020 revealed no evidence of cerebral metastasis.  There was no enhancement of the small DWI lesions and was thus compatible with multiple small infarcts.  There was an irregular left vertex  enhancing soft scalp mass.  Lumbar spine MRI on 04/21/2020 revealed widespread osseous metastatic disease in the visible spine and pelvis. There was a mild pathologic compression fracture of L2 (new since 02/2020).  There was an isolated early epidural tumor along the right posterior thecal sac at L1. There was  extraosseous extension of tumor into the right erector spinae muscle at L1. There was no malignant neural impingement.  Symptomatically, she remains somnolent, but easily arousable. She has no focal neurologic symptoms.  Plan:   1.  Metastatic renal cell carcinoma  Patient is day 15  s/p cycle #1 pembrolizumab.  Patient began axitinib on 04/15/2020.  Axitinib currently on hold secondary to potential side effects.  Patient may be eligible for palliative radiation to symptomatic bone metastasis.   Review films with Dr Baruch Gouty. 2.   Somnolence  Patient appears more interactive today.  Previously she was receiving hydrocodone/acetaminophen ATC at home.   Pain medications held.  Lethargy continues despite discontinuation.  Head MRI reveals no metastasis, but multiple small infarcts.   Anticipate carotid duplex and echo.  Electrolytes abnormalities improving.  Calcium is normal.  Etiology possibly secondary to axitinib- associated with fatigue and reversible posterior leukoencephalopathy syndrome.  EEG is planned.  Appreciate neurology consultation. 3.   Hypertension  Etiology felt secondary to axitinib.  Patient received clonidine. 4.   Hyperkalemia  Potassium 5.5 on admission.  Potassium 5.3 after Lasix.  Axitinib has been associated with hyperkalemia. 5.   Hyponatremia  Sodium 129 to 132 with hydration.  Patient has received IVF.  Thank you for allowing me to participate in RYANNE MORAND 's care.  I will follow her closely with you while hospitalized and after discharge in the outpatient department.   Lequita Asal, MD  04/21/2020, 3:47 PM

## 2020-04-21 NOTE — ED Notes (Deleted)
Pt now refuses COVID swab, MD aware.

## 2020-04-21 NOTE — ED Notes (Signed)
Hospitalist contacted regarding pt bp and requesting PRN medication.

## 2020-04-21 NOTE — Consult Note (Addendum)
Reason for Consult:Somnolence Requesting Physician: Kurtis Bushman  CC: Somnolence  I have been asked by Dr. Kurtis Bushman to see this patient in consultation for AMS.  HPI: Mariah Thornton is an 74 y.o. 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  Hydrocodone stopped about 2 days ago.    Past medical history: Renal cell carcinoma  Past Surgical History:  Procedure Laterality Date  . PORTA CATH INSERTION N/A 03/28/2020   Procedure: PORTA CATH INSERTION;  Surgeon: Algernon Huxley, MD;  Location: [facility];  Service: Cardiovascular;  Laterality: N/A;    Family history: Mother deceased with Alzheimer's  Social History:  reports that she has never smoked. She has never used smokeless tobacco. She reports current alcohol use of about 1.0 standard drink of alcohol per week. She reports previous drug use.  No Known Allergies  Medications: I have reviewed the patient's current medications. Prior to Admission medications   Medication Sig Start Date End Date Taking? Authorizing Provider  folic acid (FOLVITE) 1 MG tablet Take 1 mg by mouth daily.   Yes [provider]  hydrOXYzine (ATARAX/VISTARIL) 25 MG tablet Take 25 mg by mouth every 8 (eight) hours as needed. 03/31/20  Yes [provider]  traMADol (ULTRAM) 50 MG tablet Take 0.5 tablets (25 mg total) by mouth every 6 (six) hours as needed for severe pain. 04/20/20  Yes Corcoran, Drue Second, MD  axitinib (INLYTA) 5 MG tablet Take 1 tablet (5 mg total) by mouth 2 (two) times daily. Patient not taking: Reported on 04/21/2020 03/22/20   Lequita Asal, MD  HYDROcodone-acetaminophen (NORCO/VICODIN) 5-325 MG tablet Take 1/2 or 1 pill every 6 hours as needed  for pain. Patient not taking: Reported on 04/21/2020 04/14/20   Lequita Asal, MD  lidocaine-prilocaine (EMLA) cream Apply to affected area once Patient not taking: Reported on 04/04/2020 04/01/20   Lequita Asal, MD  ondansetron (ZOFRAN) 8 MG tablet Take 1 tablet (8 mg total) by mouth 2 (two) times daily as needed (Nausea or vomiting). Patient not taking: Reported on 04/04/2020 04/01/20   Lequita Asal, MD    ROS: History obtained from the patient  General ROS: as noted in HPI Psychological ROS: negative for - behavioral disorder, hallucinations, memory difficulties, mood swings or suicidal ideation Ophthalmic ROS: negative for - blurry vision, double vision, eye pain or loss of vision ENT ROS: negative for - epistaxis, nasal discharge, oral lesions, sore throat, tinnitus or vertigo Allergy and Immunology ROS: negative for - hives or itchy/watery eyes Hematological and Lymphatic ROS: negative for - bleeding problems, bruising or swollen lymph nodes Endocrine ROS: negative for - galactorrhea, hair pattern changes, polydipsia/polyuria or temperature intolerance Respiratory ROS: negative for - cough, hemoptysis, shortness of breath or wheezing Cardiovascular ROS: BLE edema Gastrointestinal ROS: negative for - abdominal pain, diarrhea, hematemesis, nausea/vomiting or stool incontinence Genito-Urinary ROS: negative for - dysuria, hematuria, incontinence or urinary frequency/urgency Musculoskeletal ROS: negative for - joint swelling or muscular weakness Neurological ROS: as noted in HPI Dermatological ROS: negative for rash and skin lesion changes  Physical Examination: Blood pressure (!) 152/66, pulse 61, temperature 97.7 F (36.5 C), temperature source Axillary, resp. rate 20, height 5\' 2"  (1.575 m), weight 68.8 kg, SpO2  96 %.  HEENT-  Normocephalic, no lesions, without obvious abnormality.  Normal external eye and conjunctiva.  Normal TM's bilaterally.  Normal auditory canals  and external ears. Normal external nose, mucus membranes and septum.  Normal pharynx. Cardiovascular- S1, S2 normal, pulses palpable throughout   Lungs- chest clear, no wheezing, rales, normal symmetric air entry Abdomen- soft, non-tender; bowel sounds normal; no masses,  no organomegaly Extremities- BLE edema Lymph-no adenopathy palpable Musculoskeletal-no joint tenderness, deformity or swelling Skin-warm and dry, no hyperpigmentation, vitiligo, or suspicious lesions  Neurological Examination   Mental Status: Lethargic with patient falling asleep frequently during the examination although easily alerted.  Speech fluent without evidence of aphasia.  Able to follow 3 step commands without difficulty. Cranial Nerves: II: Visual fields grossly normal, pupils equal, round, reactive to light and accommodation III,IV, VI: ptosis not present, extra-ocular motions intact bilaterally V,VII: smile symmetric, facial light touch sensation normal bilaterally VIII: hearing normal bilaterally IX,X: gag reflex present XI: bilateral shoulder shrug XII: midline tongue extension Motor: Some generalized weakness noted but able to lift all extremities against gravity with no focal weakness appreciated  Sensory: Pinprick and light touch intact throughout, bilaterally Deep Tendon Reflexes: Symmetric throughout Plantars: Right: mute   Left: mute Cerebellar: Normal finger-to-nose testing bilaterally Gait: not tested due to safety concerns   Laboratory Studies:   Basic Metabolic Panel: Recent Labs  Lab 04/20/20 1528 04/20/20 1813  NA 129* 132*  K 5.5* 5.3*  CL 101 102  CO2 20* 20*  GLUCOSE 137* 114*  BUN 22 21  CREATININE 0.91 0.85  CALCIUM 7.8* 8.2*    Liver Function Tests: Recent Labs  Lab 04/20/20 1528 04/20/20 1813  AST 56* 58*  ALT 12 13  ALKPHOS 118 127*  BILITOT 0.8 1.2  PROT 6.7 7.0  ALBUMIN 2.0* 2.2*   Recent Labs  Lab 04/20/20 1813  LIPASE 26   Recent Labs  Lab  04/21/20 0300  AMMONIA 16    CBC: Recent Labs  Lab 04/20/20 1528 04/20/20 1813  WBC 8.1 9.0  NEUTROABS 5.7  --   HGB 9.2* 9.6*  HCT 30.5* 31.5*  MCV 82.9 82.2  PLT 142* 158    Cardiac Enzymes: No results for input(s): CKTOTAL, CKMB, CKMBINDEX, TROPONINI in the last 168 hours.  BNP: Invalid input(s): POCBNP  CBG: No results for input(s): GLUCAP in the last 168 hours.  Microbiology: Results for orders placed or performed during the hospital encounter of 04/20/20  Respiratory Panel by RT PCR (Flu A&B, Covid) - Nasopharyngeal Swab     Status: None   Collection Time: 04/21/20  7:40 AM   Specimen: Nasopharyngeal Swab  Result Value Ref Range Status   SARS Coronavirus 2 by RT PCR NEGATIVE NEGATIVE Final    Comment: (NOTE) SARS-CoV-2 target nucleic acids are NOT DETECTED.  The SARS-CoV-2 RNA is generally detectable in upper respiratoy specimens during the acute phase of infection. The lowest concentration of SARS-CoV-2 viral copies this assay can detect is 131 copies/mL. A negative result does not preclude SARS-Cov-2 infection and should not be used as the sole basis for treatment or other patient management decisions. A negative result may occur with  improper specimen collection/handling, submission of specimen other than nasopharyngeal swab, presence of viral mutation(s) within the areas targeted by this assay, and inadequate number of viral copies (<131 copies/mL). A negative result must be combined with clinical observations, patient history, and epidemiological information. The expected result is Negative.  Fact Sheet for Patients:  PinkCheek.be  Fact Sheet for Healthcare Providers:  GravelBags.it  This test is no t yet approved or cleared by the [place] FDA and  has been authorized for detection and/or diagnosis of SARS-CoV-2 by FDA under an Emergency Use Authorization (EUA). This EUA will remain   in effect (meaning this test can be used) for the duration of the COVID-19 declaration under Section 564(b)(1) of the Act, 21 U.S.C. section 360bbb-3(b)(1), unless the authorization is terminated or revoked sooner.     Influenza A by PCR NEGATIVE NEGATIVE Final   Influenza B by PCR NEGATIVE NEGATIVE Final    Comment: (NOTE) The Xpert Xpress SARS-CoV-2/FLU/RSV assay is intended as an aid in  the diagnosis of influenza from Nasopharyngeal swab specimens and  should not be used as a sole basis for treatment. Nasal washings and  aspirates are unacceptable for Xpert Xpress SARS-CoV-2/FLU/RSV  testing.  Fact Sheet for Patients: PinkCheek.be  Fact Sheet for Healthcare Providers: GravelBags.it  This test is not yet approved or cleared by the [place] FDA and  has been authorized for detection and/or diagnosis of SARS-CoV-2 by  FDA under an Emergency Use Authorization (EUA). This EUA will remain  in effect (meaning this test can be used) for the duration of the  Covid-19 declaration under Section 564(b)(1) of the Act, 21  U.S.C. section 360bbb-3(b)(1), unless the authorization is  terminated or revoked. Performed at [facility], [address] [place], [place] [address]     Coagulation Studies: No results for input(s): LABPROT, INR in the last 72 hours.  Urinalysis:  Recent Labs  Lab 04/20/20 1813  COLORURINE YELLOW*  LABSPEC 1.006  PHURINE 5.0  GLUCOSEU NEGATIVE  HGBUR SMALL*  BILIRUBINUR NEGATIVE  KETONESUR NEGATIVE  PROTEINUR NEGATIVE  NITRITE NEGATIVE  LEUKOCYTESUR NEGATIVE    Lipid Panel:  No results found for: CHOL, TRIG, HDL, CHOLHDL, VLDL, LDLCALC  HgbA1C: No results found for: HGBA1C  Urine Drug Screen:      Component Value Date/Time   LABOPIA NONE DETECTED 03/03/2020 1017   COCAINSCRNUR NONE DETECTED 03/03/2020 1017   LABBENZ NONE DETECTED 03/03/2020 1017   AMPHETMU NONE  DETECTED 03/03/2020 1017   THCU NONE DETECTED 03/03/2020 1017   LABBARB NONE DETECTED 03/03/2020 1017    Alcohol Level: No results for input(s): ETH in the last 168 hours.  Other results: EKG: normal sinus rhythm at 76 bpm.  Imaging: DG Chest 2 View  Result Date: 04/20/2020 CLINICAL DATA:  74 year old female with weakness. Bilateral lower extremity swelling. Metastatic renal cell carcinoma. EXAM: CHEST - 2 VIEW COMPARISON:  Chest CT 03/03/2020 and earlier. FINDINGS: Lower lung volumes. Right chest power port. Stable mediastinal contours. Small bilateral pleural effusions. Increased bilateral pulmonary interstitial opacity, with nodular pulmonary metastasis most visible at the left lung base. Paucity of bowel gas in the upper abdomen. Degenerative osseous changes. No destructive osseous lesion identified radiographically. IMPRESSION: 1. Pulmonary metastatic disease re-demonstrated with new small pleural effusions since August. 2. Paucity of bowel gas in the upper abdomen.  Query ascites. Electronically Signed   By: Genevie Ann M.D.   On: 04/20/2020 22:24   CT Head Wo Contrast  Result Date: 04/20/2020 CLINICAL DATA:  Mental status changes, history of renal cell carcinoma EXAM: CT HEAD WITHOUT CONTRAST TECHNIQUE: Contiguous axial images were obtained from the base of the skull through the vertex without intravenous contrast. COMPARISON:  02/26/2020 FINDINGS: Brain: Mild atrophic changes and chronic white matter ischemic changes are seen stable from the prior exam. No findings to  suggest acute hemorrhage, acute infarction or space-occupying mass lesion are noted. Vascular: No hyperdense vessel or unexpected calcification. Skull: Normal. Negative for fracture or focal lesion. Sinuses/Orbits: No acute finding. Other: Scalp soft tissue nodule is noted on the left near the vertex stable from the prior exam. IMPRESSION: Chronic atrophic and ischemic changes without acute abnormality. Stable soft tissue scalp  lesion near the vertex on the left. Smaller nodule is noted in the midline anteriorly. Electronically Signed   By: Inez Catalina M.D.   On: 04/20/2020 23:03   US Venous Img Lower Bilateral  Result Date: 04/21/2020 CLINICAL DATA:  Bilateral lower extremity edema, concern for deep venous thrombosis. History of right renal cell carcinoma. No injury. No pain. EXAM: BILATERAL LOWER EXTREMITY VENOUS DOPPLER ULTRASOUND TECHNIQUE: Gray-scale sonography with compression, as well as color and duplex ultrasound, were performed to evaluate the deep venous system(s) from the level of the common femoral vein through the popliteal and proximal calf veins. COMPARISON:  CT abdomen pelvis 03/04/2020. FINDINGS: VENOUS Normal compressibility of the common femoral, superficial femoral, and popliteal veins, as well as the visualized calf veins. Visualized portions of profunda femoral vein and great saphenous vein unremarkable. No filling defects to suggest DVT on grayscale or color Doppler imaging. Doppler waveforms show normal direction of venous flow, normal respiratory plasticity and response to augmentation. Limited views of the contralateral common femoral vein are unremarkable. OTHER None. Limitations: none IMPRESSION: No femoropopliteal DVT nor evidence of DVT within the visualized calf veins. If clinical symptoms are inconsistent or if there are persistent or worsening symptoms, further imaging (possibly involving the iliac veins) may be warranted. Electronically Signed   By: Iven Finn M.D.   On: 04/21/2020 02:14     Assessment/Plan: 74 y.o. 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  Head CT personally reviewed and shows no acute changes.  Patient remains on Tramadol which may  cause lethargy as well.  Also with some metabolic abnormalities that amy be contributing to include hyponatremia and hyperkalemia with some mild elevation of LFT's.  Lastly can not rule out a shower of emboli causing some lethargy without focal abnormalities not able to be seen on head CT or subclinical seizures (which is less likely without intracerebral pathology).  No evidence of infection.  Further work up recommended.     Recommendations: 1. EEG 2. MRI of the brain without contrast 3. Agree with addressing metabolic issues 4. Would consider discontinuation of Tramadol.     Alexis Goodell, MD Neurology  04/21/2020, 10:55 AM

## 2020-04-21 NOTE — ED Notes (Signed)
Ammonia lvl obtained and sent

## 2020-04-21 NOTE — ED Notes (Signed)
Pt resting quietly at this time. No distress noted. Water given to pt per request. Pt denies any other needs at this time. Husband at bedside. Call bell in reach.

## 2020-04-21 NOTE — Progress Notes (Signed)
Patient ID: ARAMIS ZOBEL, female   DOB: [birth year], 74 y.o.   MRN: [id number] This is a no charge note as patient was admitted this a.m. [name] is a 74 y.o. 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  Neurology consulted.  Oncology was consulted today. Ordered MRI, results reviewed, notified neurology via chat on results.  Will continue on electrolyte correction. Do neurochecks.

## 2020-04-22 ENCOUNTER — Ambulatory Visit: Admission: RE | Admit: 2020-04-22 | Payer: Medicare Other | Source: Ambulatory Visit

## 2020-04-22 ENCOUNTER — Observation Stay (HOSPITAL_BASED_OUTPATIENT_CLINIC_OR_DEPARTMENT_OTHER)
Admit: 2020-04-22 | Discharge: 2020-04-22 | Disposition: A | Discharge status: Home / Self Care | Payer: Medicare Other | Attending: Nurse Practitioner | Admitting: Nurse Practitioner

## 2020-04-22 ENCOUNTER — Encounter
Admission: EM | Disposition: A | Discharge status: Home Health | Payer: Self-pay | Source: Home / Self Care | Attending: Internal Medicine

## 2020-04-22 DIAGNOSIS — M8448XA Pathological fracture, other site, initial encounter for fracture: Secondary | ICD-10-CM | POA: Diagnosis present

## 2020-04-22 DIAGNOSIS — I6389 Other cerebral infarction: Secondary | ICD-10-CM

## 2020-04-22 DIAGNOSIS — E860 Dehydration: Secondary | ICD-10-CM | POA: Diagnosis present

## 2020-04-22 DIAGNOSIS — R4182 Altered mental status, unspecified: Secondary | ICD-10-CM

## 2020-04-22 DIAGNOSIS — R600 Localized edema: Secondary | ICD-10-CM | POA: Diagnosis present

## 2020-04-22 DIAGNOSIS — Z20822 Contact with and (suspected) exposure to covid-19: Secondary | ICD-10-CM | POA: Diagnosis present

## 2020-04-22 DIAGNOSIS — I10 Essential (primary) hypertension: Secondary | ICD-10-CM | POA: Diagnosis present

## 2020-04-22 DIAGNOSIS — C7951 Secondary malignant neoplasm of bone: Secondary | ICD-10-CM | POA: Diagnosis present

## 2020-04-22 DIAGNOSIS — I517 Cardiomegaly: Secondary | ICD-10-CM | POA: Diagnosis not present

## 2020-04-22 DIAGNOSIS — I63432 Cerebral infarction due to embolism of left posterior cerebral artery: Secondary | ICD-10-CM | POA: Diagnosis present

## 2020-04-22 DIAGNOSIS — R9401 Abnormal electroencephalogram [EEG]: Secondary | ICD-10-CM | POA: Diagnosis present

## 2020-04-22 DIAGNOSIS — Z515 Encounter for palliative care: Secondary | ICD-10-CM

## 2020-04-22 DIAGNOSIS — I340 Nonrheumatic mitral (valve) insufficiency: Secondary | ICD-10-CM | POA: Diagnosis not present

## 2020-04-22 DIAGNOSIS — R400 Somnolence: Secondary | ICD-10-CM | POA: Diagnosis not present

## 2020-04-22 DIAGNOSIS — R590 Localized enlarged lymph nodes: Secondary | ICD-10-CM | POA: Diagnosis present

## 2020-04-22 DIAGNOSIS — Z79899 Other long term (current) drug therapy: Secondary | ICD-10-CM | POA: Diagnosis not present

## 2020-04-22 DIAGNOSIS — C7800 Secondary malignant neoplasm of unspecified lung: Secondary | ICD-10-CM | POA: Diagnosis present

## 2020-04-22 DIAGNOSIS — C641 Malignant neoplasm of right kidney, except renal pelvis: Secondary | ICD-10-CM | POA: Diagnosis present

## 2020-04-22 DIAGNOSIS — R7989 Other specified abnormal findings of blood chemistry: Secondary | ICD-10-CM | POA: Diagnosis present

## 2020-04-22 DIAGNOSIS — Z66 Do not resuscitate: Secondary | ICD-10-CM | POA: Diagnosis present

## 2020-04-22 DIAGNOSIS — I361 Nonrheumatic tricuspid (valve) insufficiency: Secondary | ICD-10-CM

## 2020-04-22 DIAGNOSIS — R5383 Other fatigue: Secondary | ICD-10-CM | POA: Insufficient documentation

## 2020-04-22 DIAGNOSIS — I639 Cerebral infarction, unspecified: Secondary | ICD-10-CM | POA: Diagnosis not present

## 2020-04-22 DIAGNOSIS — E875 Hyperkalemia: Secondary | ICD-10-CM | POA: Diagnosis present

## 2020-04-22 DIAGNOSIS — E871 Hypo-osmolality and hyponatremia: Secondary | ICD-10-CM | POA: Diagnosis present

## 2020-04-22 HISTORY — PX: TEE WITHOUT CARDIOVERSION: SHX5443

## 2020-04-22 LAB — BASIC METABOLIC PANEL
Anion gap: 8 (ref 5–15)
BUN: 22 mg/dL (ref 8–23)
CO2: 19 mmol/L — ABNORMAL LOW (ref 22–32)
Calcium: 7.5 mg/dL — ABNORMAL LOW (ref 8.9–10.3)
Chloride: 105 mmol/L (ref 98–111)
Creatinine, Ser: 0.83 mg/dL (ref 0.44–1.00)
GFR, Estimated: >60 mL/min (ref >60)
Glucose, Bld: 79 mg/dL (ref 70–99)
Potassium: 5.2 mmol/L — ABNORMAL HIGH (ref 3.5–5.1)
Sodium: 132 mmol/L — ABNORMAL LOW (ref 135–145)

## 2020-04-22 SURGERY — ECHOCARDIOGRAM, TRANSESOPHAGEAL
Anesthesia: Choice

## 2020-04-22 MED ORDER — MIDAZOLAM HCL 2 MG/2ML IJ SOLN
INTRAMUSCULAR | Status: AC | PRN
  Administered 2020-04-22 (×2): 1 mg via INTRAVENOUS

## 2020-04-22 MED ORDER — SODIUM CHLORIDE FLUSH 0.9 % IV SOLN
INTRAVENOUS | Status: AC
  Filled 2020-04-22: qty 10

## 2020-04-22 MED ORDER — BUTAMBEN-TETRACAINE-BENZOCAINE 2-2-14 % EX AERO
INHALATION_SPRAY | CUTANEOUS | Status: AC
  Filled 2020-04-22: qty 5

## 2020-04-22 MED ORDER — FENTANYL CITRATE (PF) 100 MCG/2ML IJ SOLN
INTRAMUSCULAR | Status: AC | PRN
  Administered 2020-04-22 (×3): 25 ug via INTRAVENOUS

## 2020-04-22 MED ORDER — LIDOCAINE VISCOUS HCL 2 % MT SOLN
OROMUCOSAL | Status: AC
  Filled 2020-04-22: qty 15

## 2020-04-22 MED ORDER — SODIUM CHLORIDE 0.9 % IV SOLN: INTRAVENOUS | Status: DC

## 2020-04-22 MED ORDER — FENTANYL CITRATE (PF) 100 MCG/2ML IJ SOLN
INTRAMUSCULAR | Status: AC
  Filled 2020-04-22: qty 2

## 2020-04-22 MED ORDER — MIDAZOLAM HCL 2 MG/2ML IJ SOLN
INTRAMUSCULAR | Status: AC | PRN
  Administered 2020-04-22: 1 mg via INTRAVENOUS

## 2020-04-22 MED ORDER — ASPIRIN EC 81 MG PO TBEC
81.0000 mg | DELAYED_RELEASE_TABLET | Freq: Every day | ORAL | Status: DC
  Administered 2020-04-23: 81 mg via ORAL
  Filled 2020-04-22: qty 1

## 2020-04-22 MED ORDER — LORAZEPAM 0.5 MG PO TABS
0.5000 mg | ORAL_TABLET | Freq: Once | ORAL | Status: AC
  Administered 2020-04-22: 0.5 mg via ORAL
  Filled 2020-04-22: qty 1

## 2020-04-22 MED ORDER — MIDAZOLAM HCL 5 MG/5ML IJ SOLN
INTRAMUSCULAR | Status: AC
  Filled 2020-04-22: qty 5

## 2020-04-22 MED ORDER — AMLODIPINE BESYLATE 5 MG PO TABS
5.0000 mg | ORAL_TABLET | Freq: Every day | ORAL | Status: DC
  Administered 2020-04-23: 5 mg via ORAL
  Filled 2020-04-22: qty 1

## 2020-04-22 NOTE — Progress Notes (Signed)
Transesophageal Echocardiogram :  Indication: Embolic stroke Requesting/ordering  physician: Dr. Kurtis Bushman  Procedure: Benzocaine spray x2 and 2 mls x 2 of viscous lidocaine were given orally to provide local anesthesia to the oropharynx. The patient was positioned supine on the left side, bite block provided. The patient was moderately sedated with the doses of versed and fentanyl as detailed below.  Using digital technique an omniplane probe was advanced into the distal esophagus without incident.   Moderate sedation: 1. Sedation used:  Versed: 2 mg, Fentanyl: 50ug 2. Time administered: 12:01 PM   time when patient started 12:30 PM recovery: Total sedation time 30 minutes 3. I was face to face during this time  See report in EPIC  for complete details: In brief,  No left atrial thrombus, no thrombus in the left atrial appendage -Moderate diffuse aortic atherosclerosis noted extending into the arch  Transgastric imaging revealed normal LV function with no RWMAs and no mural apical thrombus.  .  Estimated ejection fraction was 60%.  Right sided cardiac chambers were normal with no evidence of pulmonary hypertension.  Imaging of the septum showed no ASD or VSD Bubble study was negative for shunt 2D and color flow confirmed no PFO  The LA was well visualized in orthogonal views.  There was no spontaneous contrast    The descending thoracic aorta had moderate mural aortic debris with no evidence of aneurysmal dilation or disection   Mariah Thornton 04/22/2020 12:47 PM

## 2020-04-22 NOTE — Progress Notes (Signed)
Transported with RN for EEG. Waiting on bed to be ready.

## 2020-04-22 NOTE — Consult Note (Signed)
[place]  [phone] [phone]   Name: Mariah Thornton Date: 04/22/2020 MRN: [id number]  DOB: 1945-10-09  Patient Care Team: Philmore Pali, NP as PCP - General (Nurse Practitioner)    REASON FOR CONSULTATION: Mariah Thornton is a 74 y.o. 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  Patient also had an MRI of the lumbar spine revealing widespread metastatic disease and epidural tumor at L1.  Palliative care was consulted to help address goals.  SOCIAL HISTORY:     reports that she has never smoked. She has never used smokeless tobacco. She reports current alcohol use of about 1.0 standard drink of alcohol per week. She reports previous drug use.   Patient is married and lives at home with her husband.  They had a son who died many years ago.  Patient formally worked in housekeeping.  ADVANCE DIRECTIVES:  Does not have  CODE STATUS: Full code  PAST MEDICAL HISTORY:History reviewed. No pertinent past medical history.  PAST SURGICAL HISTORY:  Past Surgical History:  Procedure Laterality Date  . PORTA CATH INSERTION N/A 03/28/2020   Procedure: PORTA CATH INSERTION;  Surgeon: Algernon Huxley, MD;  Location: [facility];  Service: Cardiovascular;  Laterality: N/A;    HEMATOLOGY/ONCOLOGY HISTORY:  Oncology History  Renal cell carcinoma of right kidney ([place])  03/16/2020 Initial Diagnosis   Renal cell carcinoma of right kidney ([place])   04/07/2020 -  Chemotherapy   The patient had pembrolizumab (KEYTRUDA) 200 mg in sodium chloride 0.9 % 50 mL chemo infusion, 200 mg, Intravenous, Once, 1 of 6 cycles Administration: 200 mg (04/07/2020)  for  chemotherapy treatment.      ALLERGIES:  has No Known Allergies.  MEDICATIONS:  Current Facility-Administered Medications  Medication Dose Route Frequency Provider Last Rate Last Admin  . 0.9 %  sodium chloride infusion   Intravenous Continuous Theora Gianotti, NP      . Doug Sou Hold] acetaminophen (TYLENOL) tablet 650 mg  650 mg Oral Q6H PRN Athena Masse, MD       Or  . Doug Sou Hold] acetaminophen (TYLENOL) suppository 650 mg  650 mg Rectal Q6H PRN Athena Masse, MD      . Doug Sou Hold] amLODipine (NORVASC) tablet 5 mg  5 mg Oral Daily Nolberto Hanlon, MD      . Doug Sou Hold] aspirin EC tablet 81 mg  81 mg Oral Daily Alexis Goodell, MD      . butamben-tetracaine-benzocaine (CETACAINE) 08-10-12 % spray           . [MAR Hold] enoxaparin (LOVENOX) injection 40 mg  40 mg Subcutaneous Q24H Judd Gaudier V, MD   40 mg at 04/22/20 1043  . fentaNYL (SUBLIMAZE) 100 MCG/2ML injection           . lidocaine (XYLOCAINE) 2 % viscous mouth solution           . midazolam (VERSED) 5 MG/5ML injection           . [MAR Hold] ondansetron (ZOFRAN) tablet 4 mg  4 mg Oral Q6H PRN Athena Masse, MD       Or  . Doug Sou Hold] ondansetron [facility]) injection 4 mg  4  mg Intravenous Q6H PRN Judd Gaudier V, MD      . sodium chloride flush 0.9 % injection             VITAL SIGNS: BP (!) 154/88   Pulse 74   Temp 98.3 F (36.8 C) (Oral)   Resp 20   Ht $R'5\' 2"'PZ$  (1.575 m)   Wt 145 lb (65.8 kg)   SpO2 96%   BMI 26.52 kg/m  Filed Weights   04/20/20 1805 04/22/20 1150  Weight: 151 lb 11.2 oz (68.8 kg) 145 lb (65.8 kg)    Estimated body mass index is 26.52 kg/m as calculated from the following:   Height as of this encounter: $RemoveBeforeD'5\' 2"'fCqtspqAItOysp$  (1.575 m).   Weight as of this encounter: 145 lb (65.8 kg).  LABS: CBC:    Component Value Date/Time   WBC 9.0 04/20/2020 1813   HGB 9.6 (L) 04/20/2020 1813   HCT 31.5 (L) 04/20/2020 1813   PLT 158 04/20/2020 1813   MCV 82.2 04/20/2020 1813   NEUTROABS 5.7 04/20/2020 1528    LYMPHSABS 1.0 04/20/2020 1528   MONOABS 0.9 04/20/2020 1528   EOSABS 0.3 04/20/2020 1528   BASOSABS 0.0 04/20/2020 1528   Comprehensive Metabolic Panel:    Component Value Date/Time   NA 132 (L) 04/20/2020 1813   K 5.3 (H) 04/20/2020 1813   CL 102 04/20/2020 1813   CO2 20 (L) 04/20/2020 1813   BUN 21 04/20/2020 1813   CREATININE 0.85 04/20/2020 1813   GLUCOSE 114 (H) 04/20/2020 1813   CALCIUM 8.2 (L) 04/20/2020 1813   AST 58 (H) 04/20/2020 1813   ALT 13 04/20/2020 1813   ALKPHOS 127 (H) 04/20/2020 1813   BILITOT 1.2 04/20/2020 1813   PROT 7.0 04/20/2020 1813   ALBUMIN 2.2 (L) 04/20/2020 1813    RADIOGRAPHIC STUDIES: DG Chest 2 View  Result Date: 04/20/2020 CLINICAL DATA:  74 year old female with weakness. Bilateral lower extremity swelling. Metastatic renal cell carcinoma. EXAM: CHEST - 2 VIEW COMPARISON:  Chest CT 03/03/2020 and earlier. FINDINGS: Lower lung volumes. Right chest power port. Stable mediastinal contours. Small bilateral pleural effusions. Increased bilateral pulmonary interstitial opacity, with nodular pulmonary metastasis most visible at the left lung base. Paucity of bowel gas in the upper abdomen. Degenerative osseous changes. No destructive osseous lesion identified radiographically. IMPRESSION: 1. Pulmonary metastatic disease re-demonstrated with new small pleural effusions since August. 2. Paucity of bowel gas in the upper abdomen.  Query ascites. Electronically Signed   By: Genevie Ann M.D.   On: 04/20/2020 22:24   CT Head Wo Contrast  Result Date: 04/20/2020 CLINICAL DATA:  Mental status changes, history of renal cell carcinoma EXAM: CT HEAD WITHOUT CONTRAST TECHNIQUE: Contiguous axial images were obtained from the base of the skull through the vertex without intravenous contrast. COMPARISON:  02/26/2020 FINDINGS: Brain: Mild atrophic changes and chronic white matter ischemic changes are seen stable from the prior exam. No findings to suggest acute hemorrhage,  acute infarction or space-occupying mass lesion are noted. Vascular: No hyperdense vessel or unexpected calcification. Skull: Normal. Negative for fracture or focal lesion. Sinuses/Orbits: No acute finding. Other: Scalp soft tissue nodule is noted on the left near the vertex stable from the prior exam. IMPRESSION: Chronic atrophic and ischemic changes without acute abnormality. Stable soft tissue scalp lesion near the vertex on the left. Smaller nodule is noted in the midline anteriorly. Electronically Signed   By: Inez Catalina M.D.   On: 04/20/2020 23:03   MR BRAIN WO  CONTRAST  Result Date: 04/21/2020 CLINICAL DATA:  Mental status change, unknown cause. Additional history obtained from [place] with medical history of recently diagnosed renal cell carcinoma metastatic to bone and lung on chemotherapy. EXAM: MRI HEAD WITHOUT CONTRAST TECHNIQUE: Multiplanar, multiecho pulse sequences of the brain and surrounding structures were obtained without intravenous contrast. COMPARISON:  Noncontrast head CT 04/20/2020. FINDINGS: Brain: Stable, mild generalized cerebral atrophy. There are subcentimeter foci of restricted diffusion within the posterior right frontal lobe white matter (series 2, image 40), within the right frontoparietal white matter (series 2, image 38), within the left parietal lobe (series 2, image 37). An additional subcentimeter focus of restricted diffusion is questioned within the left posterior frontal lobe white matter (series 2, image 37). Advanced multifocal T2/FLAIR hyperintensity within the cerebral white matter which is nonspecific, but consistent with chronic small vessel ischemic disease. 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 No chronic intracranial blood products. No extra-axial fluid collection. No midline shift. Vascular: Expected proximal arterial flow voids. Skull and upper cervical spine: No focal marrow lesion.  Sinuses/Orbits: Visualized orbits show no acute finding. Mild ethmoid and maxillary sinus mucosal thickening. No significant mastoid effusion. Other: Again demonstrated is a 3.2 cm left parietal scalp mass (series 6, image 17). Also redemonstrated is a subcentimeter nodular lesion within the midline anterior scalp (series 6, image 12). IMPRESSION: Subcentimeter foci of restricted diffusion within the right frontoparietal white matter and left parietal white matter (3 sites total). An additional subcentimeter focus of restricted diffusion is questioned within the posterior left frontal lobe white matter. These foci may reflect acute infarcts and are suspicious for an embolic process. Given the history of recently diagnosed renal cell carcinoma, tiny metastases cannot be excluded and follow-up contrast-enhanced MR imaging should be considered. Mild generalized cerebral atrophy. Advanced cerebral white matter chronic small vessel ischemic disease. 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 Redemonstrated 3.2 cm left parietal scalp mass as well as subcentimeter nodular lesion within the midline anterior scalp. Correlate with direct visualization and consider tissue sampling if not already performed. Electronically Signed   By: Kellie Simmering DO   On: 04/21/2020 11:39   MR BRAIN W CONTRAST  Result Date: 04/21/2020 CLINICAL DATA:  74 year old female with altered mental status. Metastatic renal cell carcinoma. Multiple small embolic infarcts versus less likely small hyperdense metastases, suspected in the hemispheres on MRI earlier today. EXAM: MRI HEAD WITH CONTRAST TECHNIQUE: Multiplanar, multiecho pulse sequences of the brain and surrounding structures were obtained with intravenous contrast. CONTRAST:  64mL GADAVIST GADOBUTROL 1 MMOL/ML IV SOLN COMPARISON:  Noncontrast brain MRI 1120 hours today. FINDINGS: No abnormal intracranial enhancement identified. No enhancement corresponding to  the multiple small DWI lesions seen earlier today. No dural thickening. No intracranial mass effect. No ventriculomegaly. Basilar cisterns remain normal. The major dural venous sinuses are enhancing and appear to be patent. Negative visible cervical spine and spinal cord. Heterogeneous bone marrow signal in the calvarium but no enhancing skull lesion identified. However, there is enhancement associated with the lobulated left vertex scalp lesion (series 28, image 14). IMPRESSION: 1. No evidence of cerebral metastatic disease. No enhancement associated with the small DWI lesions demonstrated earlier today, which are therefore most compatible with multiple small infarcts. 2. The irregular 3.2 cm left vertex scalp soft mass is enhancing. Electronically Signed   By: Genevie Ann M.D.   On: 04/21/2020 18:46   MR Lumbar Spine W Wo Contrast  Result Date: 04/21/2020 CLINICAL DATA:  74 year old female with altered mental status. Metastatic renal cell carcinoma. Staging. EXAM: MRI LUMBAR SPINE WITHOUT AND WITH CONTRAST TECHNIQUE: Multiplanar and multiecho pulse sequences of the lumbar spine were obtained without and with intravenous contrast. CONTRAST:  42mL GADAVIST GADOBUTROL 1 MMOL/ML IV SOLN COMPARISON:  CT Abdomen and Pelvis 03/04/2020. FINDINGS: Segmentation:  Normal on the comparison CT. Alignment:  Stable lumbar lordosis since August. Vertebrae: Widespread spinal vertebral metastases. And much of the bony metastatic disease seems to be poorly enhancing (such as the expansile lesion in the right L1 posterior elements seen on series 13, image 7 and postcontrast series 31, image 7). The T12 vertebra appear spared. The L4 and L5 vertebra appear spared. (There is L3 spinous process tumor suspected). Scattered involvement of the visible sacrum and medial iliac bones. Mild pathologic compression fracture of L2 (series 11, image 8 is new since August. Conus medullaris and cauda equina: Conus extends to the T12 level. No lower  spinal cord or conus signal abnormality. No abnormal intradural enhancement. There is mild dural thickening and/or early epidural tumor along the right posterior thecal sac at L1. No other lumbar epidural tumor is identified. The posterior element tumor at L1 is associated with some extraosseous extension into the muscle, as well as regional erector spinae muscle edema (series 11, image 8). Paraspinal and other soft tissues: Abnormal right kidney. Abnormally thickened and nodular visible right hemidiaphragm. Bulky gallstones (series 11, image 1). Large benign left renal cyst. Evidence of generalized intra-abdominal and subcutaneous edema. Diverticulosis of large bowel in the pelvis. Disc levels: Moderate for age lumbar spine degeneration. There is mild degenerative spinal stenosis (such as at L2-L3). There is no malignant neural impingement at this time. IMPRESSION: 1. Widespread osseous metastatic disease in the visible spine and pelvis. A mild pathologic compression fracture of L2 is new since August. 2. Isolated early epidural tumor along the right posterior thecal sac at L1. No other lumbar epidural tumor. Extraosseous extension of tumor into the right erector spinae muscle at L1. No malignant neural impingement at this time. 3. Superimposed lumbar spine degeneration. Electronically Signed   By: Genevie Ann M.D.   On: 04/21/2020 18:55   NM Bone Scan Whole Body  Result Date: 04/06/2020 CLINICAL DATA:  Lower back and pelvic pain for couple weeks, history of renal cell carcinoma EXAM: NUCLEAR MEDICINE WHOLE BODY BONE SCAN TECHNIQUE: Whole body anterior and posterior images were obtained approximately 3 hours after intravenous injection of radiopharmaceutical. RADIOPHARMACEUTICALS:  20.917 mCi Technetium-89m MDP IV COMPARISON:  None Correlation: CT abdomen and pelvis 03/04/2020, CT chest 03/03/2020 FINDINGS: Abnormal tracer uptake identified at inferior RIGHT scapula, L1 vertebral body, RIGHT L2 vertebra, and distal  LEFT femoral metaphysis suspicious for osseous metastatic disease. Degenerative type uptake at shoulders, sternoclavicular joints, and knees. Soft tissue distribution of tracer unremarkable. Minimal RIGHT renal tracer corresponding to known large RIGHT renal neoplasm decreased RIGHT renal function. Photopenic defect at mid LEFT kidney corresponding to large cyst on CT. IMPRESSION: Foci of abnormal tracer uptake at the inferior RIGHT scapula, L1 and L2 vertebral bodies, and distal LEFT femoral metaphysis consistent with osseous metastases. Large LEFT renal cyst and known large RIGHT renal neoplasm with poor RIGHT renal function. Electronically Signed   By: Lavonia Dana M.D.   On: 04/06/2020 11:23   PERIPHERAL VASCULAR CATHETERIZATION  Result Date: 03/28/2020 See op note  US Venous Img Lower Bilateral  Result Date: 04/21/2020 CLINICAL DATA:  Bilateral lower extremity edema, concern for  deep venous thrombosis. History of right renal cell carcinoma. No injury. No pain. EXAM: BILATERAL LOWER EXTREMITY VENOUS DOPPLER ULTRASOUND TECHNIQUE: Gray-scale sonography with compression, as well as color and duplex ultrasound, were performed to evaluate the deep venous system(s) from the level of the common femoral vein through the popliteal and proximal calf veins. COMPARISON:  CT abdomen pelvis 03/04/2020. FINDINGS: VENOUS Normal compressibility of the common femoral, superficial femoral, and popliteal veins, as well as the visualized calf veins. Visualized portions of profunda femoral vein and great saphenous vein unremarkable. No filling defects to suggest DVT on grayscale or color Doppler imaging. Doppler waveforms show normal direction of venous flow, normal respiratory plasticity and response to augmentation. Limited views of the contralateral common femoral vein are unremarkable. OTHER None. Limitations: none IMPRESSION: No femoropopliteal DVT nor evidence of DVT within the visualized calf veins. If clinical  symptoms are inconsistent or if there are persistent or worsening symptoms, further imaging (possibly involving the iliac veins) may be warranted. Electronically Signed   By: Iven Finn M.D.   On: 04/21/2020 02:14   ECHOCARDIOGRAM COMPLETE  Result Date: 04/06/2020    ECHOCARDIOGRAM REPORT   Patient Name:   Mariah Thornton Date of Exam: 04/06/2020 Medical Rec #:  [id number]     Height:       62.0 in Accession #:    [id number]    Weight:       150.1 lb Date of Birth:  1945/08/24      BSA:          1.692 m Patient Age:    57 years      BP:           119/68 mmHg Patient Gender: F             HR:           88 bpm. Exam Location:  Outpatient Procedure: 2D Echo, Cardiac Doppler and Color Doppler Indications:    Chemo evaluation  History:        Patient has no prior history of Echocardiogram examinations.                 Renal cell carcinoma.  Sonographer:    Dustin Flock Referring Phys: [id number] [place] Comments: Patient is morbidly obese. Global longitudinal strain was attempted. IMPRESSIONS  1. Left ventricular ejection fraction, by estimation, is 70 to 75%. The left ventricle has hyperdynamic function. The left ventricle has no regional wall motion abnormalities. There is mild left ventricular hypertrophy. Left ventricular diastolic parameters are consistent with Grade I diastolic dysfunction (impaired relaxation).  2. Right ventricular systolic function is normal. The right ventricular size is normal. There is normal pulmonary artery systolic pressure. The estimated right ventricular systolic pressure is 70.6 mmHg.  3. The mitral valve is grossly normal. No evidence of mitral valve regurgitation.  4. The aortic valve is tricuspid. Aortic valve regurgitation is not visualized. Aortic valve mean gradient measures 9.0 mmHg.  5. The inferior vena cava is normal in size with greater than 50% respiratory variability, suggesting right atrial pressure of 3 mmHg. Comparison(s): No prior  Echocardiogram. FINDINGS  Left Ventricle: Left ventricular ejection fraction, by estimation, is 70 to 75%. The left ventricle has hyperdynamic function. The left ventricle has no regional wall motion abnormalities. The left ventricular internal cavity size was normal in size. There is mild left ventricular hypertrophy. Left ventricular diastolic parameters are consistent with Grade I diastolic dysfunction (impaired relaxation). Indeterminate filling pressures.  Right Ventricle: The right ventricular size is normal. No increase in right ventricular wall thickness. Right ventricular systolic function is normal. There is normal pulmonary artery systolic pressure. The tricuspid regurgitant velocity is 1.59 m/s, and  with an assumed right atrial pressure of 3 mmHg, the estimated right ventricular systolic pressure is 07.6 mmHg. Left Atrium: Left atrial size was normal in size. Right Atrium: Right atrial size was normal in size. Pericardium: There is no evidence of pericardial effusion. Mitral Valve: The mitral valve is grossly normal. No evidence of mitral valve regurgitation. Tricuspid Valve: The tricuspid valve is grossly normal. Tricuspid valve regurgitation is trivial. Aortic Valve: The aortic valve is tricuspid. Aortic valve regurgitation is not visualized. Aortic valve mean gradient measures 9.0 mmHg. Aortic valve peak gradient measures 18.7 mmHg. Aortic valve area, by VTI measures 2.61 cm. Pulmonic Valve: The pulmonic valve was normal in structure. Pulmonic valve regurgitation is not visualized. Aorta: The aortic root and ascending aorta are structurally normal, with no evidence of dilitation. Venous: The inferior vena cava is normal in size with greater than 50% respiratory variability, suggesting right atrial pressure of 3 mmHg. IAS/Shunts: No atrial level shunt detected by color flow Doppler.  LEFT VENTRICLE PLAX 2D LVIDd:         3.90 cm  Diastology LVIDs:         2.30 cm  LV e' medial:    6.64 cm/s LV PW:          1.20 cm  LV E/e' medial:  10.9 LV IVS:        1.10 cm  LV e' lateral:   8.70 cm/s LVOT diam:     2.00 cm  LV E/e' lateral: 8.3 LV SV:         98 LV SV Index:   58 LVOT Area:     3.14 cm  RIGHT VENTRICLE             IVC RV Basal diam:  2.00 cm     IVC diam: 1.20 cm RV S prime:     13.70 cm/s TAPSE (M-mode): 1.9 cm LEFT ATRIUM             Index       RIGHT ATRIUM          Index LA diam:        3.40 cm 2.01 cm/m  RA Area:     9.56 cm LA Vol (A2C):   61.7 ml 36.46 ml/m RA Volume:   14.20 ml 8.39 ml/m LA Vol (A4C):   26.9 ml 15.90 ml/m LA Biplane Vol: 43.9 ml 25.94 ml/m  AORTIC VALVE AV Area (Vmax):    2.08 cm AV Area (Vmean):   2.41 cm AV Area (VTI):     2.61 cm AV Vmax:           216.00 cm/s AV Vmean:          141.000 cm/s AV VTI:            0.375 m AV Peak Grad:      18.7 mmHg AV Mean Grad:      9.0 mmHg LVOT Vmax:         143.00 cm/s LVOT Vmean:        108.000 cm/s LVOT VTI:          0.312 m LVOT/AV VTI ratio: 0.83  AORTA Ao Root diam: 3.20 cm Ao Asc diam:  3.20 cm MITRAL VALVE  TRICUSPID VALVE MV Area (PHT): 2.99 cm    TR Peak grad:   10.1 mmHg MV Decel Time: 254 msec    TR Vmax:        159.00 cm/s MV E velocity: 72.40 cm/s MV A velocity: 88.70 cm/s  SHUNTS MV E/A ratio:  0.82        Systemic VTI:  0.31 m                            Systemic Diam: 2.00 cm Lyman Bishop MD Electronically signed by Lyman Bishop MD Signature Date/Time: 04/06/2020/3:52:03 PM    Final    ECHO TEE  Result Date: 04/22/2020    TRANSESOPHOGEAL ECHO REPORT   Patient Name:   Mariah Thornton Date of Exam: 04/22/2020 Medical Rec #:  [id number]     Height:       62.0 in Accession #:    [id number]    Weight:       145.0 lb Date of Birth:  1946/03/10      BSA:          1.667 m Patient Age:    16 years      BP:           144/129 mmHg Patient Gender: F             HR:           84 bpm. Exam Location:  [facility] Procedure: Transesophageal Echo, Cardiac Doppler and Color Doppler Indications:     Not listed on check-in form   History:         Patient has prior history of Echocardiogram examinations, most                  recent 04/06/2020. No medical history on file.  Sonographer:     Sherrie Sport RDCS (AE) Referring Phys:  [place] Diagnosing Phys: Ida Rogue MD PROCEDURE: After discussion of the risks and benefits of a TEE, an informed consent was obtained from a family member. The transesophogeal probe was passed without difficulty through the esophogus of the patient. Local oropharyngeal anesthetic was provided with Cetacaine and viscous lidocaine. Sedation performed by performing physician. Patients was under conscious sedation during this procedure. Anesthetic administered: 46mcg of Fentanyl, 2.$RemoveBefore'0mg'[id number]$  of Versed. Image quality was excellent. The patient's vital signs; including heart rate, blood pressure, and oxygen saturation; remained stable throughout the procedure. The patient developed no complications during the procedure. IMPRESSIONS  1. No thrombus noted in the left atrium or left atrial appendage.  2. Left ventricular ejection fraction, by estimation, is 60 to 65%. The left ventricle has normal function. The left ventricle has no regional wall motion abnormalities. There is mild left ventricular hypertrophy.  3. Right ventricular systolic function is normal. The right ventricular size is normal.  4. The mitral valve is normal in structure. Mild mitral valve regurgitation.  5. There is Moderate (Grade III) atheroma plaque involving the transverse and descending aorta.  6. No valve vegetation Conclusion(s)/Recommendation(s): Normal biventricular function without evidence of hemodynamically significant valvular heart disease. FINDINGS  Left Ventricle: Left ventricular ejection fraction, by estimation, is 60 to 65%. The left ventricle has normal function. The left ventricle has no regional wall motion abnormalities. The left ventricular internal cavity size was normal in size. There is  mild left ventricular  hypertrophy. Right Ventricle: The right ventricular size is normal. No increase in right ventricular wall thickness.  Right ventricular systolic function is normal. Left Atrium: Left atrial size was normal in size. No left atrial/left atrial appendage thrombus was detected. Right Atrium: Right atrial size was normal in size. Pericardium: There is no evidence of pericardial effusion. Mitral Valve: The mitral valve is normal in structure. Mild mitral valve regurgitation. No evidence of mitral valve stenosis. Tricuspid Valve: The tricuspid valve is normal in structure. Tricuspid valve regurgitation is mild . No evidence of tricuspid stenosis. Aortic Valve: The aortic valve is normal in structure. Aortic valve regurgitation is not visualized. No aortic stenosis is present. Pulmonic Valve: The pulmonic valve was normal in structure. Pulmonic valve regurgitation is not visualized. No evidence of pulmonic stenosis. Aorta: The aortic root is normal in size and structure. There is moderate (Grade III) atheroma plaque involving the transverse and descending aorta. Venous: The inferior vena cava is normal in size with greater than 50% respiratory variability, suggesting right atrial pressure of 3 mmHg. IAS/Shunts: No atrial level shunt detected by color flow Doppler. Agitated saline contrast was given intravenously to evaluate for intracardiac shunting. Ida Rogue MD Electronically signed by Ida Rogue MD Signature Date/Time: 04/22/2020/1:06:47 PM    Final    DG FEMUR MIN 2 VIEWS LEFT  Result Date: 04/08/2020 CLINICAL DATA:  Left femur met seen on bone scan. Evaluate bone integrity. EXAM: LEFT FEMUR 2 VIEWS COMPARISON:  Bone scan 04/05/2020 FINDINGS: Two view exam of the left femur shows no evidence for worrisome lytic or sclerotic osseous abnormality. Patient has substantial tricompartmental degenerative change in the knee without metaphyseal lesion. Uptake on the bone scan could potentially have been degenerative.  IMPRESSION: No suspicious lytic or sclerotic osseous abnormality in the metaphysis of the distal left femur to suggest metastatic disease. Marked tricompartmental degenerative changes. Degenerative disease could account for some of the uptake seen in the distal left femur. Electronically Signed   By: Misty Stanley M.D.   On: 04/08/2020 15:05    PERFORMANCE STATUS (ECOG) : 3 - Symptomatic, >50% confined to bed  Review of Systems Unless otherwise noted, a complete review of systems is negative.  Physical Exam General: Thin, frail-appearing Pulmonary: Unlabored Extremities: no edema, no joint deformities Skin: no rashes Neurological: Lethargic, wakes when stimulated, confused  IMPRESSION: Patient seen in specials following her TEE.  Patient remains lethargic and confused.  Patient has been seen in consultation by neurology.  AMS felt likely multifactorial secondary to CVA and metabolic abnormalities.  I met with patient's husband to discuss goals.  He seems to recognize the severity of her current medical problems in light of metastatic cancer and now with CVA.  He says that his goals are aligned with continued treatment in the hospital and he would like to give her time to see if she clinically improves.    Disposition is unclear at the present time.  PT/OT/ST consults are pending.  Prior to this hospitalization, patient lived at home and was independent with all of her own care.  Husband recognizes that it is possible that patient does not return to her pervious functional/cognitive baseline.  We also discussed the possibility for end-of-life decision-making.  We discussed CODE STATUS.  Husband would like for patient to remain a full code for now.  However, he says that he has never discussed end-of-life decision-making with patient nor did she have any advance directives.  Emotional support provided.  Chaplain offered but husband declined.  PLAN: -Continue current scope of  treatment -Full code for now -Will follow   Patient  expressed understanding and was in agreement with this plan. She also understands that She can call the clinic at any time with any questions, concerns, or complaints.     Time Total: 60 minutes  Visit consisted of counseling and education dealing with the complex and emotionally intense issues of symptom management and palliative care in the setting of serious and potentially life-threatening illness.Greater than 50%  of this time was spent counseling and coordinating care related to the above assessment and plan.  Signed by: Altha Harm, PhD, NP-C

## 2020-04-22 NOTE — Progress Notes (Signed)
*  PRELIMINARY RESULTS* Echocardiogram Echocardiogram Transesophageal has been performed.  Mariah Thornton 04/22/2020, 12:37 PM

## 2020-04-22 NOTE — Procedures (Signed)
ELECTROENCEPHALOGRAM REPORT   Patient: Mariah Thornton       Room #: 129A-AA EEG No. ID: [id number] Age: 74 y.o.        Sex: female Requesting Physician: Amery Report Date:  04/22/2020        Interpreting Physician: Alexis Goodell  History: Mariah Thornton is an 74 y.o. female with altered mental status  Medications:  Norvasc, ASA   Conditions of Recording:  This is a 21 channel routine scalp EEG performed with bipolar and monopolar montages arranged in accordance to the international 10/20 system of electrode placement. One channel was dedicated to EKG recording.  The patient is in the lethargic state.  Description:  The background activity is not continuous.  There are short periods when an alpha background rhythm can be achieved but these alternate with frequent, generalized periods of polymorphic delta activity that is most prominent frontally and has the characteristics of FIRDA.  These periods last from 5-6 seconds.  There are also noted intermittent discharges of triphasic morphology.   Hyperventilation was not performed.  Intermittent photic stimulation was performed but failed to illicit any change in the tracing.     IMPRESSION: This is an abnormal electroencephalogram secondary to the presence of frequent periods of slowing that are more prominent frontally (FIRDA) and triphasic waves.  These findings may be seen with a diffuse cerebral disturbance that is etiologically nonspecific, but may include a metabolic encephalopathy, among other possibilities.    Alexis Goodell, MD Neurology  04/22/2020, 7:05 PM

## 2020-04-22 NOTE — ED Notes (Signed)
Admitting physician notified of pt continued HTN status. Awaiting response and orders.

## 2020-04-22 NOTE — Progress Notes (Signed)
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 Husband at bedside. Has no complaints, but keeps falling a sleep during exam  Objective: Vitals:   04/22/20 1530 04/22/20 1545 04/22/20 1600 04/22/20 1801  BP: (!) 150/90  (!) 154/88 (!) 171/63  Pulse:  76 74 84  Resp: 18 (!) 25 20 18   Temp:    98.4 F (36.9 C)  TempSrc:    Oral  SpO2: 96% 96% 96% 97%  Weight:      Height:       No intake or output data in the 24 hours ending 04/22/20 1955 Filed Weights   04/20/20 1805 04/22/20 1150  Weight: 68.8 kg 65.8 kg    Examination:  General exam: sleepy/somnolent Respiratory system: Clear to auscultation. Respiratory effort normal, no wheezing Cardiovascular system: S1 & S2 heard, RRR. No JVD, murmurs, rubs, gallops or clicks.  Gastrointestinal system: Abdomen is nondistended, soft and nontender. No organomegaly or masses felt. Normal bowel sounds heard. Central nervous system: unable to perform. Extremities: trace pedal edema  Psychiatry: unable to assess     Data Reviewed: I have personally reviewed following labs and imaging studies  CBC: Recent Labs  Lab 04/20/20 1528 04/20/20 1813  WBC 8.1 9.0  NEUTROABS 5.7  --   HGB 9.2* 9.6*  HCT 30.5* 31.5*  MCV 82.9 82.2  PLT 142* 785   Basic Metabolic Panel: Recent Labs  Lab 04/20/20 1528 04/20/20 1813 04/22/20 1808  NA 129*  132* 132*  K 5.5* 5.3* 5.2*  CL 101 102 105  CO2 20* 20* 19*  GLUCOSE 137* 114* 79  BUN 22 21 22   CREATININE 0.91 0.85 0.83  CALCIUM 7.8* 8.2* 7.5*   GFR: Estimated Creatinine Clearance: 52.1 mL/min (by C-G formula based on SCr of 0.83 mg/dL). Liver Function Tests: Recent Labs  Lab 04/20/20 1528 04/20/20 1813  AST 56* 58*  ALT 12 13  ALKPHOS 118 127*  BILITOT 0.8 1.2  PROT 6.7 7.0  ALBUMIN 2.0* 2.2*   Recent Labs  Lab 04/20/20 1813  LIPASE 26   Recent Labs  Lab 04/21/20 0300  AMMONIA 16   Coagulation Profile: No results for input(s): INR, PROTIME in the last 168 hours. Cardiac Enzymes: No results for input(s): CKTOTAL, CKMB, CKMBINDEX, TROPONINI in the last 168 hours. BNP (last 3 results) No results for input(s): PROBNP in the last 8760 hours. HbA1C: No results for input(s): HGBA1C in the last 72 hours. CBG: No results for input(s): GLUCAP in the last 168 hours. Lipid Profile: No results for input(s): CHOL, HDL, LDLCALC, TRIG, CHOLHDL, LDLDIRECT in the last 72 hours. Thyroid Function Tests: No results for input(s): TSH, T4TOTAL, FREET4, T3FREE, THYROIDAB in the last 72 hours. Anemia Panel: No results for input(s): VITAMINB12, FOLATE, FERRITIN, TIBC, IRON, RETICCTPCT in the last 72 hours. Sepsis Labs: No results for input(s): PROCALCITON, LATICACIDVEN in the last 168 hours.  Recent Results (from the past  240 hour(s))  Respiratory Panel by RT PCR (Flu A&B, Covid) - Nasopharyngeal Swab     Status: None   Collection Time: 04/21/20  7:40 AM   Specimen: Nasopharyngeal Swab  Result Value Ref Range Status   SARS Coronavirus 2 by RT PCR NEGATIVE NEGATIVE Final    Comment: (NOTE) SARS-CoV-2 target nucleic acids are NOT DETECTED.  The SARS-CoV-2 RNA is generally detectable in upper respiratoy specimens during the acute phase of infection. The lowest concentration of SARS-CoV-2 viral copies this assay can detect is 131 copies/mL. A negative result does not preclude  SARS-Cov-2 infection and should not be used as the sole basis for treatment or other patient management decisions. A negative result may occur with  improper specimen collection/handling, submission of specimen other than nasopharyngeal swab, presence of viral mutation(s) within the areas targeted by this assay, and inadequate number of viral copies (<131 copies/mL). A negative result must be combined with clinical observations, patient history, and epidemiological information. The expected result is Negative.  Fact Sheet for Patients:  PinkCheek.be  Fact Sheet for Healthcare Providers:  GravelBags.it  This test is no t yet approved or cleared by the [place] FDA and  has been authorized for detection and/or diagnosis of SARS-CoV-2 by FDA under an Emergency Use Authorization (EUA). This EUA will remain  in effect (meaning this test can be used) for the duration of the COVID-19 declaration under Section 564(b)(1) of the Act, 21 U.S.C. section 360bbb-3(b)(1), unless the authorization is terminated or revoked sooner.     Influenza A by PCR NEGATIVE NEGATIVE Final   Influenza B by PCR NEGATIVE NEGATIVE Final    Comment: (NOTE) The Xpert Xpress SARS-CoV-2/FLU/RSV assay is intended as an aid in  the diagnosis of influenza from Nasopharyngeal swab specimens and  should not be used as a sole basis for treatment. Nasal washings and  aspirates are unacceptable for Xpert Xpress SARS-CoV-2/FLU/RSV  testing.  Fact Sheet for Patients: PinkCheek.be  Fact Sheet for Healthcare Providers: GravelBags.it  This test is not yet approved or cleared by the [place] FDA and  has been authorized for detection and/or diagnosis of SARS-CoV-2 by  FDA under an Emergency Use Authorization (EUA). This EUA will remain  in effect (meaning this test can be used) for the duration of the    Covid-19 declaration under Section 564(b)(1) of the Act, 21  U.S.C. section 360bbb-3(b)(1), unless the authorization is  terminated or revoked. Performed at [facility], [address] [place], [place] [address]          Radiology Studies: EEG  Result Date: 04/22/2020 Alexis Goodell, MD     04/22/2020  7:10 PM ELECTROENCEPHALOGRAM REPORT Patient: Mariah Thornton       Room #: 129A-AA EEG No. ID: [id number] Age: 74 y.o.        Sex: female Requesting Physician: Mariah Thornton Report Date:  04/22/2020       Interpreting Physician: Alexis Goodell History: Mariah Thornton is an 74 y.o. female with altered mental status Medications: Norvasc, ASA Conditions of Recording:  This is a 21 channel routine scalp EEG performed with bipolar and monopolar montages arranged in accordance to the international 10/20 system of electrode placement. One channel was dedicated to EKG recording. The patient is in the lethargic state. Description:  The background activity is not continuous.  There are short periods when an alpha background rhythm can be achieved but these alternate with frequent, generalized periods of polymorphic delta activity that is most  prominent frontally and has the characteristics of FIRDA.  These periods last from 5-6 seconds.  There are also noted intermittent discharges of triphasic morphology.  Hyperventilation was not performed.  Intermittent photic stimulation was performed but failed to illicit any change in the tracing.  IMPRESSION: This is an abnormal electroencephalogram secondary to the presence of frequent periods of slowing that are more prominent frontally (FIRDA) and triphasic waves.  These findings may be seen with a diffuse cerebral disturbance that is etiologically nonspecific, but may include a metabolic encephalopathy, among other possibilities.  Alexis Goodell, MD Neurology 04/22/2020, 7:05 PM   DG Chest 2 View  Result Date: 04/20/2020 CLINICAL DATA:  74 year old female  with weakness. Bilateral lower extremity swelling. Metastatic renal cell carcinoma. EXAM: CHEST - 2 VIEW COMPARISON:  Chest CT 03/03/2020 and earlier. FINDINGS: Lower lung volumes. Right chest power port. Stable mediastinal contours. Small bilateral pleural effusions. Increased bilateral pulmonary interstitial opacity, with nodular pulmonary metastasis most visible at the left lung base. Paucity of bowel gas in the upper abdomen. Degenerative osseous changes. No destructive osseous lesion identified radiographically. IMPRESSION: 1. Pulmonary metastatic disease re-demonstrated with new small pleural effusions since August. 2. Paucity of bowel gas in the upper abdomen.  Query ascites. Electronically Signed   By: Genevie Ann M.D.   On: 04/20/2020 22:24   CT Head Wo Contrast  Result Date: 04/20/2020 CLINICAL DATA:  Mental status changes, history of renal cell carcinoma EXAM: CT HEAD WITHOUT CONTRAST TECHNIQUE: Contiguous axial images were obtained from the base of the skull through the vertex without intravenous contrast. COMPARISON:  02/26/2020 FINDINGS: Brain: Mild atrophic changes and chronic white matter ischemic changes are seen stable from the prior exam. No findings to suggest acute hemorrhage, acute infarction or space-occupying mass lesion are noted. Vascular: No hyperdense vessel or unexpected calcification. Skull: Normal. Negative for fracture or focal lesion. Sinuses/Orbits: No acute finding. Other: Scalp soft tissue nodule is noted on the left near the vertex stable from the prior exam. IMPRESSION: Chronic atrophic and ischemic changes without acute abnormality. Stable soft tissue scalp lesion near the vertex on the left. Smaller nodule is noted in the midline anteriorly. Electronically Signed   By: Inez Catalina M.D.   On: 04/20/2020 23:03   MR BRAIN WO CONTRAST  Result Date: 04/21/2020 CLINICAL DATA:  Mental status change, unknown cause. 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 EXAM: MRI HEAD WITHOUT CONTRAST TECHNIQUE: Multiplanar, multiecho pulse sequences of the brain and surrounding structures were obtained without intravenous contrast. COMPARISON:  Noncontrast head CT 04/20/2020. FINDINGS: Brain: Stable, mild generalized cerebral atrophy. There are subcentimeter foci of restricted diffusion within the posterior right frontal lobe white matter (series 2, image 40), within the right frontoparietal white matter (series 2, image 38), within the left parietal lobe (series 2, image 37). An additional subcentimeter focus of restricted diffusion is questioned within the left posterior frontal lobe white matter (series 2, image 37). Advanced multifocal T2/FLAIR hyperintensity within the cerebral white matter which is nonspecific, but consistent with chronic small vessel ischemic disease. 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 No chronic intracranial blood products. No extra-axial fluid collection. No midline shift. Vascular: Expected proximal arterial flow voids. Skull and upper cervical spine: No focal marrow lesion. Sinuses/Orbits: Visualized orbits show no acute finding. Mild ethmoid and maxillary sinus mucosal thickening. No significant mastoid effusion. Other: Again demonstrated  is a 3.2 cm left parietal scalp mass (series 6, image 17). Also redemonstrated is a subcentimeter nodular lesion within the midline anterior scalp (series 6, image 12). IMPRESSION: Subcentimeter foci of restricted diffusion within the right frontoparietal white matter and left parietal white matter (3 sites total). An additional subcentimeter focus of restricted diffusion is questioned within the posterior left frontal lobe white matter. These foci may reflect acute infarcts and are suspicious for an embolic process. Given the history of recently diagnosed renal  cell carcinoma, tiny metastases cannot be excluded and follow-up contrast-enhanced MR imaging should be considered. Mild generalized cerebral atrophy. Advanced cerebral white matter chronic small vessel ischemic disease. 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 Redemonstrated 3.2 cm left parietal scalp mass as well as subcentimeter nodular lesion within the midline anterior scalp. Correlate with direct visualization and consider tissue sampling if not already performed. Electronically Signed   By: Kellie Simmering DO   On: 04/21/2020 11:39   MR BRAIN W CONTRAST  Result Date: 04/21/2020 CLINICAL DATA:  74 year old female with altered mental status. Metastatic renal cell carcinoma. Multiple small embolic infarcts versus less likely small hyperdense metastases, suspected in the hemispheres on MRI earlier today. EXAM: MRI HEAD WITH CONTRAST TECHNIQUE: Multiplanar, multiecho pulse sequences of the brain and surrounding structures were obtained with intravenous contrast. CONTRAST:  63mL GADAVIST GADOBUTROL 1 MMOL/ML IV SOLN COMPARISON:  Noncontrast brain MRI 1120 hours today. FINDINGS: No abnormal intracranial enhancement identified. No enhancement corresponding to the multiple small DWI lesions seen earlier today. No dural thickening. No intracranial mass effect. No ventriculomegaly. Basilar cisterns remain normal. The major dural venous sinuses are enhancing and appear to be patent. Negative visible cervical spine and spinal cord. Heterogeneous bone marrow signal in the calvarium but no enhancing skull lesion identified. However, there is enhancement associated with the lobulated left vertex scalp lesion (series 28, image 14). IMPRESSION: 1. No evidence of cerebral metastatic disease. No enhancement associated with the small DWI lesions demonstrated earlier today, which are therefore most compatible with multiple small infarcts. 2. The irregular 3.2 cm left vertex scalp soft mass is  enhancing. Electronically Signed   By: Genevie Ann M.D.   On: 04/21/2020 18:46   MR Lumbar Spine W Wo Contrast  Result Date: 04/21/2020 CLINICAL DATA:  74 year old female with altered mental status. Metastatic renal cell carcinoma. Staging. EXAM: MRI LUMBAR SPINE WITHOUT AND WITH CONTRAST TECHNIQUE: Multiplanar and multiecho pulse sequences of the lumbar spine were obtained without and with intravenous contrast. CONTRAST:  34mL GADAVIST GADOBUTROL 1 MMOL/ML IV SOLN COMPARISON:  CT Abdomen and Pelvis 03/04/2020. FINDINGS: Segmentation:  Normal on the comparison CT. Alignment:  Stable lumbar lordosis since August. Vertebrae: Widespread spinal vertebral metastases. And much of the bony metastatic disease seems to be poorly enhancing (such as the expansile lesion in the right L1 posterior elements seen on series 13, image 7 and postcontrast series 31, image 7). The T12 vertebra appear spared. The L4 and L5 vertebra appear spared. (There is L3 spinous process tumor suspected). Scattered involvement of the visible sacrum and medial iliac bones. Mild pathologic compression fracture of L2 (series 11, image 8 is new since August. Conus medullaris and cauda equina: Conus extends to the T12 level. No lower spinal cord or conus signal abnormality. No abnormal intradural enhancement. There is mild dural thickening and/or early epidural tumor along the right posterior thecal sac at L1. No other lumbar epidural tumor is identified. The posterior element tumor at L1  is associated with some extraosseous extension into the muscle, as well as regional erector spinae muscle edema (series 11, image 8). Paraspinal and other soft tissues: Abnormal right kidney. Abnormally thickened and nodular visible right hemidiaphragm. Bulky gallstones (series 11, image 1). Large benign left renal cyst. Evidence of generalized intra-abdominal and subcutaneous edema. Diverticulosis of large bowel in the pelvis. Disc levels: Moderate for age lumbar spine  degeneration. There is mild degenerative spinal stenosis (such as at L2-L3). There is no malignant neural impingement at this time. IMPRESSION: 1. Widespread osseous metastatic disease in the visible spine and pelvis. A mild pathologic compression fracture of L2 is new since August. 2. Isolated early epidural tumor along the right posterior thecal sac at L1. No other lumbar epidural tumor. Extraosseous extension of tumor into the right erector spinae muscle at L1. No malignant neural impingement at this time. 3. Superimposed lumbar spine degeneration. Electronically Signed   By: Genevie Ann M.D.   On: 04/21/2020 18:55   US Venous Img Lower Bilateral  Result Date: 04/21/2020 CLINICAL DATA:  Bilateral lower extremity edema, concern for deep venous thrombosis. History of right renal cell carcinoma. No injury. No pain. EXAM: BILATERAL LOWER EXTREMITY VENOUS DOPPLER ULTRASOUND TECHNIQUE: Gray-scale sonography with compression, as well as color and duplex ultrasound, were performed to evaluate the deep venous system(s) from the level of the common femoral vein through the popliteal and proximal calf veins. COMPARISON:  CT abdomen pelvis 03/04/2020. FINDINGS: VENOUS Normal compressibility of the common femoral, superficial femoral, and popliteal veins, as well as the visualized calf veins. Visualized portions of profunda femoral vein and great saphenous vein unremarkable. No filling defects to suggest DVT on grayscale or color Doppler imaging. Doppler waveforms show normal direction of venous flow, normal respiratory plasticity and response to augmentation. Limited views of the contralateral common femoral vein are unremarkable. OTHER None. Limitations: none IMPRESSION: No femoropopliteal DVT nor evidence of DVT within the visualized calf veins. If clinical symptoms are inconsistent or if there are persistent or worsening symptoms, further imaging (possibly involving the iliac veins) may be warranted. Electronically  Signed   By: Iven Finn M.D.   On: 04/21/2020 02:14   ECHO TEE  Result Date: 04/22/2020    TRANSESOPHOGEAL ECHO REPORT   Patient Name:   Mariah Thornton Date of Exam: 04/22/2020 Medical Rec #:  [id number]     Height:       62.0 in Accession #:    [id number]    Weight:       145.0 lb Date of Birth:  25-Aug-1945      BSA:          1.667 m Patient Age:    89 years      BP:           144/129 mmHg Patient Gender: F             HR:           84 bpm. Exam Location:  [facility] Procedure: Transesophageal Echo, Cardiac Doppler and Color Doppler Indications:     Not listed on check-in form  History:         Patient has prior history of Echocardiogram examinations, most                  recent 04/06/2020. No medical history on file.  Sonographer:     Sherrie Sport RDCS (AE) Referring Phys:  [id number] Diagnosing Phys: Mariah Rogue MD PROCEDURE: After discussion of  the risks and benefits of a TEE, an informed consent was obtained from a family member. The transesophogeal probe was passed without difficulty through the esophogus of the patient. Local oropharyngeal anesthetic was provided with Cetacaine and viscous lidocaine. Sedation performed by performing physician. Patients was under conscious sedation during this procedure. Anesthetic administered: 41mcg of Fentanyl, 2.0mg  of Versed. Image quality was excellent. The patient's vital signs; including heart rate, blood pressure, and oxygen saturation; remained stable throughout the procedure. The patient developed no complications during the procedure. IMPRESSIONS  1. No thrombus noted in the left atrium or left atrial appendage.  2. Left ventricular ejection fraction, by estimation, is 60 to 65%. The left ventricle has normal function. The left ventricle has no regional wall motion abnormalities. There is mild left ventricular hypertrophy.  3. Right ventricular systolic function is normal. The right ventricular size is normal.  4. The mitral valve is normal in  structure. Mild mitral valve regurgitation.  5. There is Moderate (Grade III) atheroma plaque involving the transverse and descending aorta.  6. No valve vegetation Conclusion(s)/Recommendation(s): Normal biventricular function without evidence of hemodynamically significant valvular heart disease. FINDINGS  Left Ventricle: Left ventricular ejection fraction, by estimation, is 60 to 65%. The left ventricle has normal function. The left ventricle has no regional wall motion abnormalities. The left ventricular internal cavity size was normal in size. There is  mild left ventricular hypertrophy. Right Ventricle: The right ventricular size is normal. No increase in right ventricular wall thickness. Right ventricular systolic function is normal. Left Atrium: Left atrial size was normal in size. No left atrial/left atrial appendage thrombus was detected. Right Atrium: Right atrial size was normal in size. Pericardium: There is no evidence of pericardial effusion. Mitral Valve: The mitral valve is normal in structure. Mild mitral valve regurgitation. No evidence of mitral valve stenosis. Tricuspid Valve: The tricuspid valve is normal in structure. Tricuspid valve regurgitation is mild . No evidence of tricuspid stenosis. Aortic Valve: The aortic valve is normal in structure. Aortic valve regurgitation is not visualized. No aortic stenosis is present. Pulmonic Valve: The pulmonic valve was normal in structure. Pulmonic valve regurgitation is not visualized. No evidence of pulmonic stenosis. Aorta: The aortic root is normal in size and structure. There is moderate (Grade III) atheroma plaque involving the transverse and descending aorta. Venous: The inferior vena cava is normal in size with greater than 50% respiratory variability, suggesting right atrial pressure of 3 mmHg. IAS/Shunts: No atrial level shunt detected by color flow Doppler. Agitated saline contrast was given intravenously to evaluate for intracardiac  shunting. Mariah Rogue MD Electronically signed by Mariah Rogue MD Signature Date/Time: 04/22/2020/1:06:47 PM    Final         Scheduled Meds: . amLODipine  5 mg Oral Daily  . aspirin EC  81 mg Oral Daily  . butamben-tetracaine-benzocaine      . enoxaparin (LOVENOX) injection  40 mg Subcutaneous Q24H  . fentaNYL      . lidocaine      . midazolam      . sodium chloride flush       Continuous Infusions:  Assessment & Plan:   Principal Problem:   Somnolence Active Problems:   Renal cell carcinoma of right kidney (HCC)   Bone metastases (HCC)   Hypertension   Hyperkalemia   Bilateral lower extremity edema   Low back pain   Palliative care encounter  74 year old female with history of recently diagnosed renal cell carcinoma metastatic to bone  and lung on chemotherapy,  on hydrocodone  for bone pain presenting with persistent somnolence in spite of discontinuation of hydrocodone as well as lower extremity edema and hyperkalemia with potassium of 5.5.    Somnolence -EEG doneabnormal electroencephalogram secondary to the presence of frequent periods of slowing that are more prominent frontally (FIRDA) and triphasic waves.  These findings may be seen with a diffuse cerebral disturbance that is etiologically nonspecific, but may include a metabolic encephalopathy, among other possibilities -CT head negative MRI brain- see full report. Small acute infarcts bilaterally, etiology likely embolic TEE no LAA thrombus or PFO Neurology following- rec. [id number] with palvix 75mg  daily x3 weeks, then asa 81mg  there after. Diona Fanti can be given rectally at 300mg  daily if not taking po -may need link monitor as outpt  Continue neuro checks Carotid doppler    Bilateral lower extremity edema LE doppler neg for dvt Given lasix in er.       Renal cell carcinoma of right kidney (HCC)   Bone metastases ([place]) -lumbar spine MRI revealed widespread osseous mets in spine and pelvis.  Mild  pathologic compression fx of L2-new Oncology following Palliative care consulted Pt is day 15 s/p cycle #1 pembrolizumab Pt began axitinib on 04/15/20 axitinib on hold 2/2 potential side effects May be elligible for palliative radiation to symptomatic bone mets, will f/u with oncology    Hypertension -stable, monitor  Continue home meds    Hyperkalemia -improving with iv hydration Was given lasix in ER Continue to monitor     Low back pain -Nonnarcotic pain meds for now while patient still somnolent      DVT prophylaxis: lovenox Code Status:full Family Communication: spoke to husband and daughter Mariah Thornton Status is: Inpatient  Remains inpatient appropriate because:IV treatments appropriate due to intensity of illness or inability to take PO   Dispo: The patient is from: Home              Anticipated d/c is to: Home              Anticipated d/c date is: 2 days              Patient currently is not medically stable to d/c.            LOS: 0 days   Time spent: 35 min with >50% on coc    Nolberto Hanlon, MD [facility] Pager [phone]  If 7PM-7AM, please contact night-coverage [email or web address] Password [facility] 04/22/2020, 7:55 PM

## 2020-04-22 NOTE — ED Notes (Signed)
Update provided to patient's family member, [name], [phone]. Per patient's husband [name], ok to provide updates to Lucia Gaskins, and [place].   Levada Dy is caregiver for patient and assists husband as well. Reports husband having difficulty understanding and managing patient's care at home and in hospital. Update on POC provided.   Levada Dy asking about plan for DC home, Hospice, etc. Requesting call back from MD for further clarification and planning. Will message MD.

## 2020-04-22 NOTE — Progress Notes (Signed)
Subjective: Patient remains lethargic today.  Speech more slurred.    Objective: Current vital signs: BP (!) 168/68 (BP Location: Left Arm)    Pulse 74    Temp 98.4 F (36.9 C) (Axillary)    Resp (!) 22    Ht 5\' 2"  (1.575 m)    Wt 68.8 kg    SpO2 93%    BMI 27.75 kg/m  Vital signs in last 24 hours: Temp:  [98.4 F (36.9 C)] 98.4 F (36.9 C) (10/15 0900) Pulse Rate:  [66-88] 74 (10/15 0900) Resp:  [17-22] 22 (10/15 0900) BP: (131-219)/(63-194) 168/68 (10/15 0900) SpO2:  [92 %-98 %] 93 % (10/15 0900)  Intake/Output from previous day: No intake/output data recorded. Intake/Output this shift: No intake/output data recorded. Nutritional status:  Diet Order            Diet NPO time specified  Diet effective now                 Neurologic Exam: Mental Status: Lethargic with patient falling asleep frequently during the examination although can be alerted for short periods of time.  Speech dysarthric with some paraphasic errors and word substitutions noted.  Able to follow some simple commands with continued attempts at stimulation.  Questionable left neglect Cranial Nerves: II: Unable to test III,IV, VI: Corneal response intact V,VII: face symmetric VIII: hearing normal bilaterally IX,X: gag reflex present XI: Unable to test XII: Unable to test Motor: Some generalized weakness noted.  Able to lift both upper extremities but does not lift the lower extremities  Sensory: Responds to light noxious stimuli througout Deep Tendon Reflexes: Symmetric throughout Plantars: Right: mute                              Left: mute Cerebellar: Unable to test  Lab Results: Basic Metabolic Panel: Recent Labs  Lab 04/20/20 1528 04/20/20 1813  NA 129* 132*  K 5.5* 5.3*  CL 101 102  CO2 20* 20*  GLUCOSE 137* 114*  BUN 22 21  CREATININE 0.91 0.85  CALCIUM 7.8* 8.2*    Liver Function Tests: Recent Labs  Lab 04/20/20 1528 04/20/20 1813  AST 56* 58*  ALT 12 13  ALKPHOS 118  127*  BILITOT 0.8 1.2  PROT 6.7 7.0  ALBUMIN 2.0* 2.2*   Recent Labs  Lab 04/20/20 1813  LIPASE 26   Recent Labs  Lab 04/21/20 0300  AMMONIA 16    CBC: Recent Labs  Lab 04/20/20 1528 04/20/20 1813  WBC 8.1 9.0  NEUTROABS 5.7  --   HGB 9.2* 9.6*  HCT 30.5* 31.5*  MCV 82.9 82.2  PLT 142* 158    Cardiac Enzymes: No results for input(s): CKTOTAL, CKMB, CKMBINDEX, TROPONINI in the last 168 hours.  Lipid Panel: No results for input(s): CHOL, TRIG, HDL, CHOLHDL, VLDL, LDLCALC in the last 168 hours.  CBG: No results for input(s): GLUCAP in the last 168 hours.  Microbiology: Results for orders placed or performed during the hospital encounter of 04/20/20  Respiratory Panel by RT PCR (Flu A&B, Covid) - Nasopharyngeal Swab     Status: None   Collection Time: 04/21/20  7:40 AM   Specimen: Nasopharyngeal Swab  Result Value Ref Range Status   SARS Coronavirus 2 by RT PCR NEGATIVE NEGATIVE Final    Comment: (NOTE) SARS-CoV-2 target nucleic acids are NOT DETECTED.  The SARS-CoV-2 RNA is generally detectable in upper respiratoy specimens during the acute  phase of infection. The lowest concentration of SARS-CoV-2 viral copies this assay can detect is 131 copies/mL. A negative result does not preclude SARS-Cov-2 infection and should not be used as the sole basis for treatment or other patient management decisions. A negative result may occur with  improper specimen collection/handling, submission of specimen other than nasopharyngeal swab, presence of viral mutation(s) within the areas targeted by this assay, and inadequate number of viral copies (<131 copies/mL). A negative result must be combined with clinical observations, patient history, and epidemiological information. The expected result is Negative.  Fact Sheet for Patients:  PinkCheek.be  Fact Sheet for Healthcare Providers:  GravelBags.it  This test is  no t yet approved or cleared by the [place] FDA and  has been authorized for detection and/or diagnosis of SARS-CoV-2 by FDA under an Emergency Use Authorization (EUA). This EUA will remain  in effect (meaning this test can be used) for the duration of the COVID-19 declaration under Section 564(b)(1) of the Act, 21 U.S.C. section 360bbb-3(b)(1), unless the authorization is terminated or revoked sooner.     Influenza A by PCR NEGATIVE NEGATIVE Final   Influenza B by PCR NEGATIVE NEGATIVE Final    Comment: (NOTE) The Xpert Xpress SARS-CoV-2/FLU/RSV assay is intended as an aid in  the diagnosis of influenza from Nasopharyngeal swab specimens and  should not be used as a sole basis for treatment. Nasal washings and  aspirates are unacceptable for Xpert Xpress SARS-CoV-2/FLU/RSV  testing.  Fact Sheet for Patients: PinkCheek.be  Fact Sheet for Healthcare Providers: GravelBags.it  This test is not yet approved or cleared by the [place] FDA and  has been authorized for detection and/or diagnosis of SARS-CoV-2 by  FDA under an Emergency Use Authorization (EUA). This EUA will remain  in effect (meaning this test can be used) for the duration of the  Covid-19 declaration under Section 564(b)(1) of the Act, 21  U.S.C. section 360bbb-3(b)(1), unless the authorization is  terminated or revoked. Performed at [facility], [address] [place], [place] [address]     Coagulation Studies: No results for input(s): LABPROT, INR in the last 72 hours.  Imaging: DG Chest 2 View  Result Date: 04/20/2020 CLINICAL DATA:  74 year old female with weakness. Bilateral lower extremity swelling. Metastatic renal cell carcinoma. EXAM: CHEST - 2 VIEW COMPARISON:  Chest CT 03/03/2020 and earlier. FINDINGS: Lower lung volumes. Right chest power port. Stable mediastinal contours. Small bilateral pleural effusions. Increased  bilateral pulmonary interstitial opacity, with nodular pulmonary metastasis most visible at the left lung base. Paucity of bowel gas in the upper abdomen. Degenerative osseous changes. No destructive osseous lesion identified radiographically. IMPRESSION: 1. Pulmonary metastatic disease re-demonstrated with new small pleural effusions since August. 2. Paucity of bowel gas in the upper abdomen.  Query ascites. Electronically Signed   By: Genevie Ann M.D.   On: 04/20/2020 22:24   CT Head Wo Contrast  Result Date: 04/20/2020 CLINICAL DATA:  Mental status changes, history of renal cell carcinoma EXAM: CT HEAD WITHOUT CONTRAST TECHNIQUE: Contiguous axial images were obtained from the base of the skull through the vertex without intravenous contrast. COMPARISON:  02/26/2020 FINDINGS: Brain: Mild atrophic changes and chronic white matter ischemic changes are seen stable from the prior exam. No findings to suggest acute hemorrhage, acute infarction or space-occupying mass lesion are noted. Vascular: No hyperdense vessel or unexpected calcification. Skull: Normal. Negative for fracture or focal lesion. Sinuses/Orbits: No acute finding. Other: Scalp soft tissue nodule is noted  on the left near the vertex stable from the prior exam. IMPRESSION: Chronic atrophic and ischemic changes without acute abnormality. Stable soft tissue scalp lesion near the vertex on the left. Smaller nodule is noted in the midline anteriorly. Electronically Signed   By: Inez Catalina M.D.   On: 04/20/2020 23:03   MR BRAIN WO CONTRAST  Result Date: 04/21/2020 CLINICAL DATA:  Mental status change, unknown cause. 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 EXAM: MRI HEAD WITHOUT CONTRAST TECHNIQUE: Multiplanar, multiecho pulse sequences of the brain and surrounding structures were obtained without intravenous contrast.  COMPARISON:  Noncontrast head CT 04/20/2020. FINDINGS: Brain: Stable, mild generalized cerebral atrophy. There are subcentimeter foci of restricted diffusion within the posterior right frontal lobe white matter (series 2, image 40), within the right frontoparietal white matter (series 2, image 38), within the left parietal lobe (series 2, image 37). An additional subcentimeter focus of restricted diffusion is questioned within the left posterior frontal lobe white matter (series 2, image 37). Advanced multifocal T2/FLAIR hyperintensity within the cerebral white matter which is nonspecific, but consistent with chronic small vessel ischemic disease. 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 No chronic intracranial blood products. No extra-axial fluid collection. No midline shift. Vascular: Expected proximal arterial flow voids. Skull and upper cervical spine: No focal marrow lesion. Sinuses/Orbits: Visualized orbits show no acute finding. Mild ethmoid and maxillary sinus mucosal thickening. No significant mastoid effusion. Other: Again demonstrated is a 3.2 cm left parietal scalp mass (series 6, image 17). Also redemonstrated is a subcentimeter nodular lesion within the midline anterior scalp (series 6, image 12). IMPRESSION: Subcentimeter foci of restricted diffusion within the right frontoparietal white matter and left parietal white matter (3 sites total). An additional subcentimeter focus of restricted diffusion is questioned within the posterior left frontal lobe white matter. These foci may reflect acute infarcts and are suspicious for an embolic process. Given the history of recently diagnosed renal cell carcinoma, tiny metastases cannot be excluded and follow-up contrast-enhanced MR imaging should be considered. Mild generalized cerebral atrophy. Advanced cerebral white matter chronic small vessel ischemic disease. Chronic lacunar infarcts within the left basal ganglia and  right thalamus. Redemonstrated 3.2 cm left parietal scalp mass as well as subcentimeter nodular lesion within the midline anterior scalp. Correlate with direct visualization and consider tissue sampling if not already performed. Electronically Signed   By: Kellie Simmering DO   On: 04/21/2020 11:39   MR BRAIN W CONTRAST  Result Date: 04/21/2020 CLINICAL DATA:  74 year old female with altered mental status. Metastatic renal cell carcinoma. Multiple small embolic infarcts versus less likely small hyperdense metastases, suspected in the hemispheres on MRI earlier today. EXAM: MRI HEAD WITH CONTRAST TECHNIQUE: Multiplanar, multiecho pulse sequences of the brain and surrounding structures were obtained with intravenous contrast. CONTRAST:  61mL GADAVIST GADOBUTROL 1 MMOL/ML IV SOLN COMPARISON:  Noncontrast brain MRI 1120 hours today. FINDINGS: No abnormal intracranial enhancement identified. No enhancement corresponding to the multiple small DWI lesions seen earlier today. No dural thickening. No intracranial mass effect. No ventriculomegaly. Basilar cisterns remain normal. The major dural venous sinuses are enhancing and appear to be patent. Negative visible cervical spine and spinal cord. Heterogeneous bone marrow signal in the calvarium but no enhancing skull lesion identified. However, there is enhancement associated with the lobulated left vertex scalp lesion (series 28, image 14). IMPRESSION: 1. No evidence of  cerebral metastatic disease. No enhancement associated with the small DWI lesions demonstrated earlier today, which are therefore most compatible with multiple small infarcts. 2. The irregular 3.2 cm left vertex scalp soft mass is enhancing. Electronically Signed   By: Genevie Ann M.D.   On: 04/21/2020 18:46   MR Lumbar Spine W Wo Contrast  Result Date: 04/21/2020 CLINICAL DATA:  74 year old female with altered mental status. Metastatic renal cell carcinoma. Staging. EXAM: MRI LUMBAR SPINE WITHOUT AND WITH  CONTRAST TECHNIQUE: Multiplanar and multiecho pulse sequences of the lumbar spine were obtained without and with intravenous contrast. CONTRAST:  4mL GADAVIST GADOBUTROL 1 MMOL/ML IV SOLN COMPARISON:  CT Abdomen and Pelvis 03/04/2020. FINDINGS: Segmentation:  Normal on the comparison CT. Alignment:  Stable lumbar lordosis since August. Vertebrae: Widespread spinal vertebral metastases. And much of the bony metastatic disease seems to be poorly enhancing (such as the expansile lesion in the right L1 posterior elements seen on series 13, image 7 and postcontrast series 31, image 7). The T12 vertebra appear spared. The L4 and L5 vertebra appear spared. (There is L3 spinous process tumor suspected). Scattered involvement of the visible sacrum and medial iliac bones. Mild pathologic compression fracture of L2 (series 11, image 8 is new since August. Conus medullaris and cauda equina: Conus extends to the T12 level. No lower spinal cord or conus signal abnormality. No abnormal intradural enhancement. There is mild dural thickening and/or early epidural tumor along the right posterior thecal sac at L1. No other lumbar epidural tumor is identified. The posterior element tumor at L1 is associated with some extraosseous extension into the muscle, as well as regional erector spinae muscle edema (series 11, image 8). Paraspinal and other soft tissues: Abnormal right kidney. Abnormally thickened and nodular visible right hemidiaphragm. Bulky gallstones (series 11, image 1). Large benign left renal cyst. Evidence of generalized intra-abdominal and subcutaneous edema. Diverticulosis of large bowel in the pelvis. Disc levels: Moderate for age lumbar spine degeneration. There is mild degenerative spinal stenosis (such as at L2-L3). There is no malignant neural impingement at this time. IMPRESSION: 1. Widespread osseous metastatic disease in the visible spine and pelvis. A mild pathologic compression fracture of L2 is new since  August. 2. Isolated early epidural tumor along the right posterior thecal sac at L1. No other lumbar epidural tumor. Extraosseous extension of tumor into the right erector spinae muscle at L1. No malignant neural impingement at this time. 3. Superimposed lumbar spine degeneration. Electronically Signed   By: Genevie Ann M.D.   On: 04/21/2020 18:55   US Venous Img Lower Bilateral  Result Date: 04/21/2020 CLINICAL DATA:  Bilateral lower extremity edema, concern for deep venous thrombosis. History of right renal cell carcinoma. No injury. No pain. EXAM: BILATERAL LOWER EXTREMITY VENOUS DOPPLER ULTRASOUND TECHNIQUE: Gray-scale sonography with compression, as well as color and duplex ultrasound, were performed to evaluate the deep venous system(s) from the level of the common femoral vein through the popliteal and proximal calf veins. COMPARISON:  CT abdomen pelvis 03/04/2020. FINDINGS: VENOUS Normal compressibility of the common femoral, superficial femoral, and popliteal veins, as well as the visualized calf veins. Visualized portions of profunda femoral vein and great saphenous vein unremarkable. No filling defects to suggest DVT on grayscale or color Doppler imaging. Doppler waveforms show normal direction of venous flow, normal respiratory plasticity and response to augmentation. Limited views of the contralateral common femoral vein are unremarkable. OTHER None. Limitations: none IMPRESSION: No femoropopliteal DVT nor evidence of DVT within the visualized  calf veins. If clinical symptoms are inconsistent or if there are persistent or worsening symptoms, further imaging (possibly involving the iliac veins) may be warranted. Electronically Signed   By: Iven Finn M.D.   On: 04/21/2020 02:14    Medications:  I have reviewed the patient's current medications. Scheduled:  amLODipine  5 mg Oral Daily   enoxaparin (LOVENOX) injection  40 mg Subcutaneous Q24H    Assessment/Plan: 74 y.o. female with  medical history significant forrecently diagnosed renal cell carcinoma metastatic to bone and lung on chemotherapy, on hydrocodone for bone pain who was sent in by her oncologist for further work-up of persistent somnolence in spite of discontinuation of hydrocodone.  MRI of the brain personally reviewed and shows evidence of small, acute infarcts bilaterally.  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  MRI of the lumbar spine was performed as well showing widespread metastatic disease and an early epidural tumor at L1.   Mental status abnormalities likely multifactorial in etiology and related to acute infarcts and metabolic abnormalities.   TEE pending.       Recommendations: 1. EEG 2. Agree with addressing metabolic issues 3. HgbA1c, fasting lipid panel, TSH 4. PT consult, OT consult, Speech consult 5. TEE pending 6. Carotid dopplers 7. Prophylactic therapy-ASA 81mg  daily with start of DUAP (ASA 81mg  and Plavix 75mg  daily) after TEE to be continued for 3 weeks before change to ASA 81mg  daily which is to be continued.  ASA may be given rectally at 300mg  daily if unable to take po 8. Telemetry monitoring 9. Frequent neuro checks   LOS: 0 days   Alexis Goodell, MD Neurology  04/22/2020  11:09 AM

## 2020-04-22 NOTE — Progress Notes (Signed)
° ° °  [facility] has been requested to perform a transesophageal echocardiogram on Mrs. Prada for rule out of embolic stroke/left atrial appendage thrombus.  After careful review of history and examination, the risks and benefits of transesophageal echocardiogram have been explained to the patient's husband, as pt unable to consent due to AMS, and including risks of esophageal damage, perforation (1:10,000 risk), bleeding, pharyngeal hematoma as well as other potential complications associated with conscious sedation including aspiration, arrhythmia, respiratory failure and death. Case discussed with rounding MD / cardiologist with request to obtain consent via husband. Alternatives to treatment were discussed with husband, questions were answered. Consent has been provided for patient by husband due to AMS.  Arvil Chaco, PA-C 04/22/2020 8:37 AM

## 2020-04-22 NOTE — Progress Notes (Signed)
Report called to [facility] on 1A. Pallative care NP speaking with patient's husband. Patient alert to name, aware of her husband at bedside.

## 2020-04-22 NOTE — Progress Notes (Signed)
Procedure performed TEE by Dr. Rockey Situ. Patient tolerated without adverse reactions. rReceived 3mg  versed and 86mcq fentanyl per orders by Dr. Rockey Situ.  Patient to be admitted, awaiting a bed assignment.

## 2020-04-22 NOTE — Progress Notes (Signed)
eeg done °

## 2020-04-23 ENCOUNTER — Inpatient Hospital Stay: Payer: Medicare Other

## 2020-04-23 DIAGNOSIS — R400 Somnolence: Secondary | ICD-10-CM | POA: Diagnosis not present

## 2020-04-23 DIAGNOSIS — I639 Cerebral infarction, unspecified: Secondary | ICD-10-CM | POA: Diagnosis not present

## 2020-04-23 LAB — TSH: TSH: 11.41 u[IU]/mL — ABNORMAL HIGH (ref 0.350–4.500)

## 2020-04-23 LAB — LIPID PANEL
Cholesterol: 126 mg/dL (ref 0–200)
HDL: 13 mg/dL — ABNORMAL LOW (ref >40)
LDL Cholesterol: 83 mg/dL (ref 0–99)
Total CHOL/HDL Ratio: 9.7 RATIO
Triglycerides: 151 mg/dL — ABNORMAL HIGH (ref <150)
VLDL: 30 mg/dL (ref 0–40)

## 2020-04-23 LAB — BASIC METABOLIC PANEL
Anion gap: 9 (ref 5–15)
BUN: 19 mg/dL (ref 8–23)
CO2: 20 mmol/L — ABNORMAL LOW (ref 22–32)
Calcium: 7.7 mg/dL — ABNORMAL LOW (ref 8.9–10.3)
Chloride: 105 mmol/L (ref 98–111)
Creatinine, Ser: 0.86 mg/dL (ref 0.44–1.00)
GFR, Estimated: >60 mL/min (ref >60)
Glucose, Bld: 101 mg/dL — ABNORMAL HIGH (ref 70–99)
Potassium: 4.3 mmol/L (ref 3.5–5.1)
Sodium: 134 mmol/L — ABNORMAL LOW (ref 135–145)

## 2020-04-23 IMAGING — US US CAROTID DUPLEX BILAT
1 series · 13 of 24 positions shown · non-contrast
Comparison: None.

CLINICAL DATA: 74-year-old female with stroke-like symptoms.

EXAM:
BILATERAL CAROTID DUPLEX ULTRASOUND
TECHNIQUE: Gray scale imaging, color Doppler and duplex ultrasound were
performed of bilateral carotid and vertebral arteries in the neck.

[Series 1: us carotid bilateral · 13 of 62 slices shown]
[im 1/62]
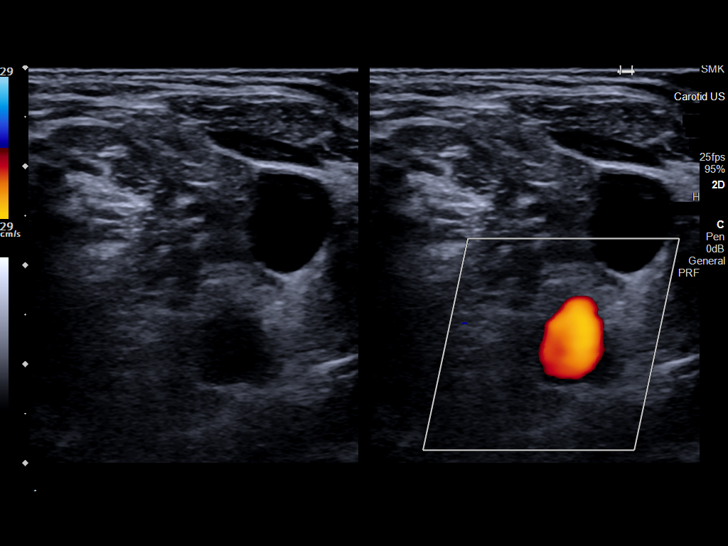
[im 6/62]
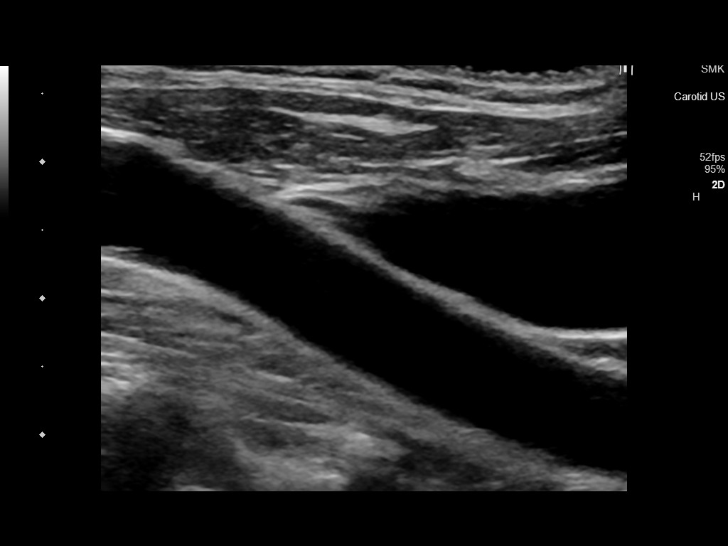
[im 11/62]
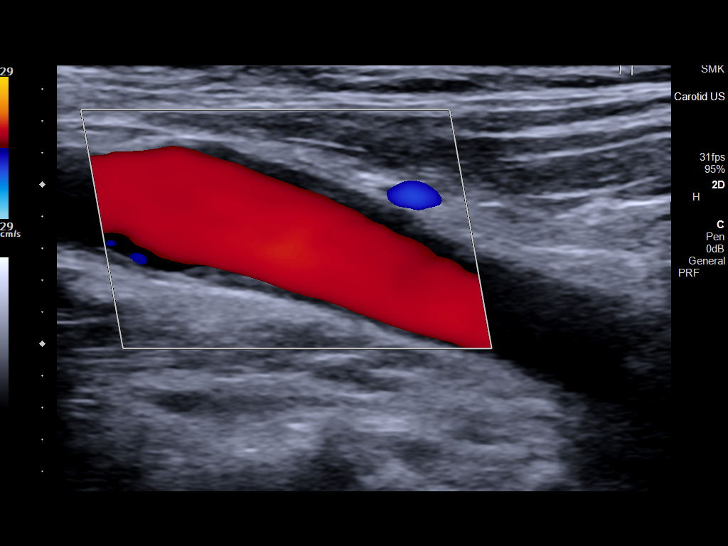
[im 16/62]
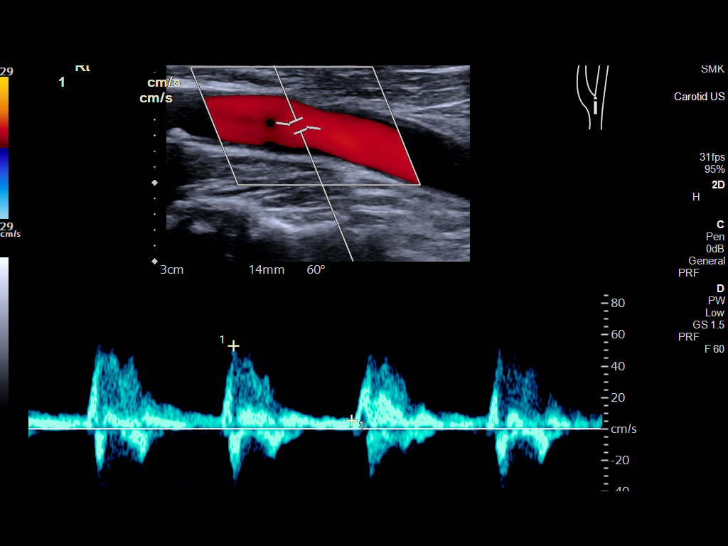
[im 22/62]
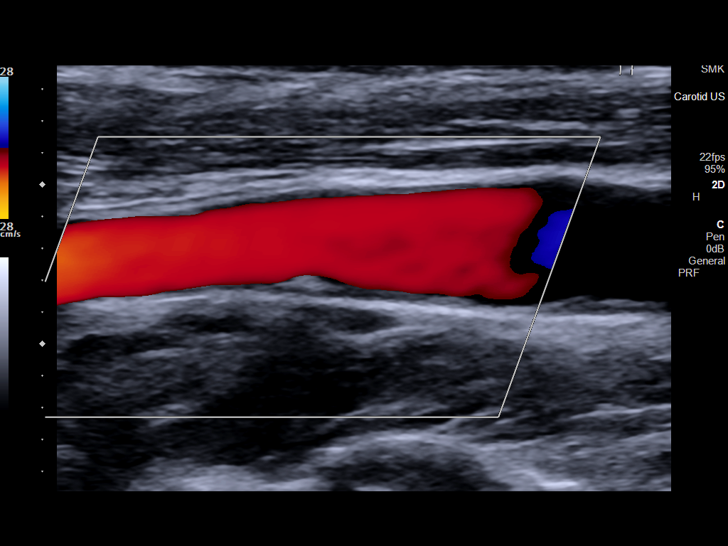
[im 27/62]
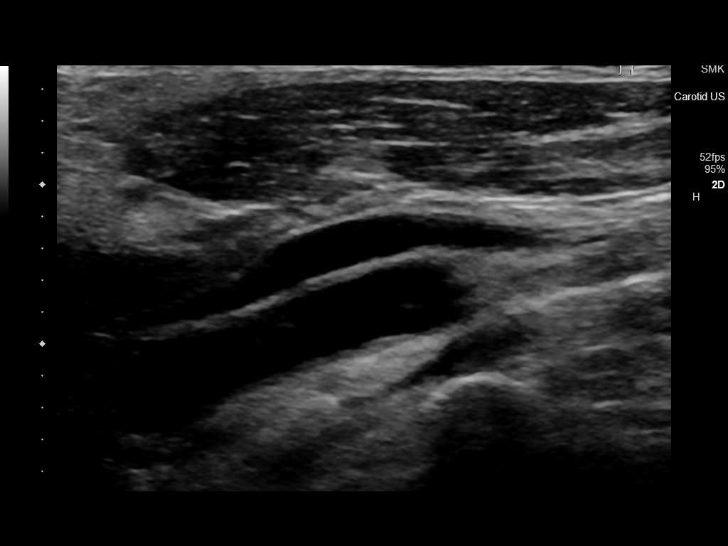
[im 32/62]
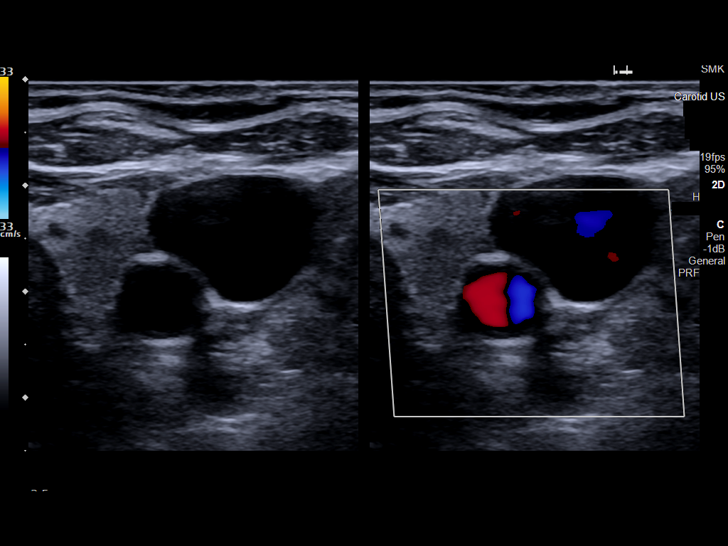
[im 35/62]
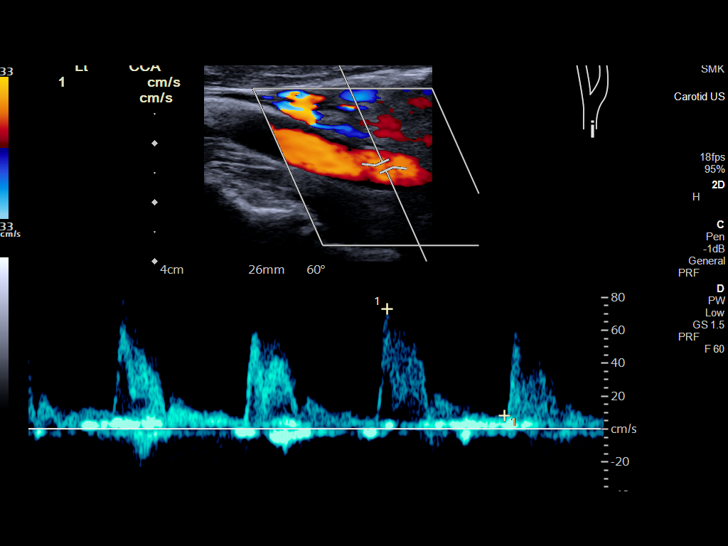
[im 40/62]
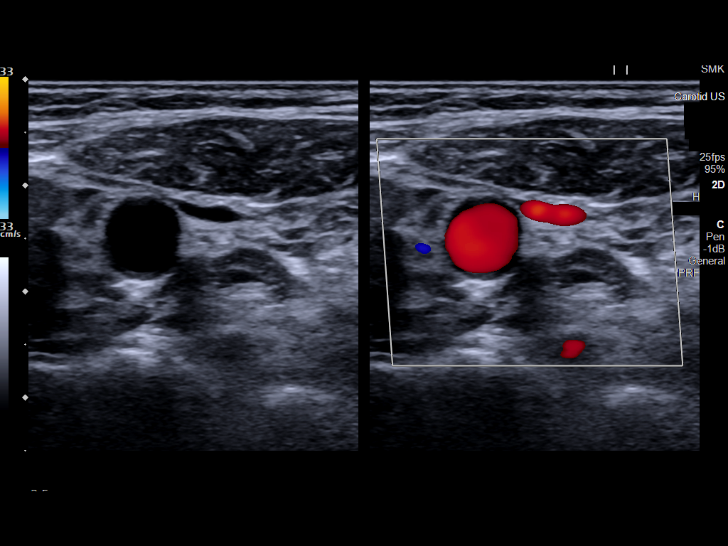
[im 46/62]
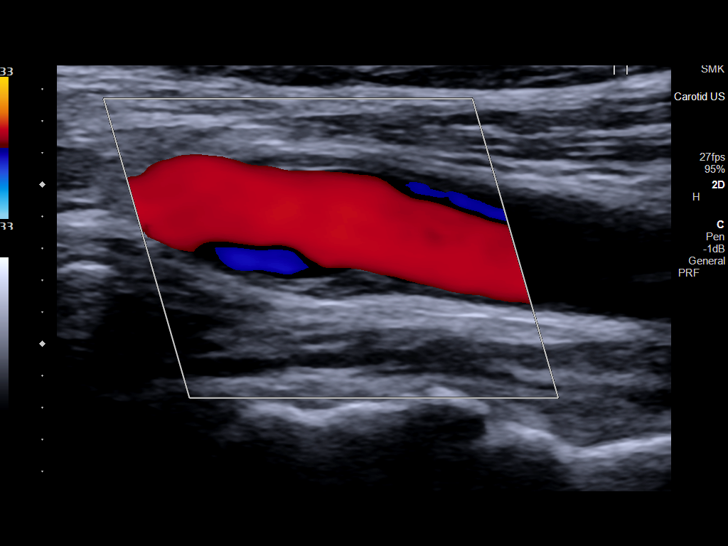
[im 51/62]
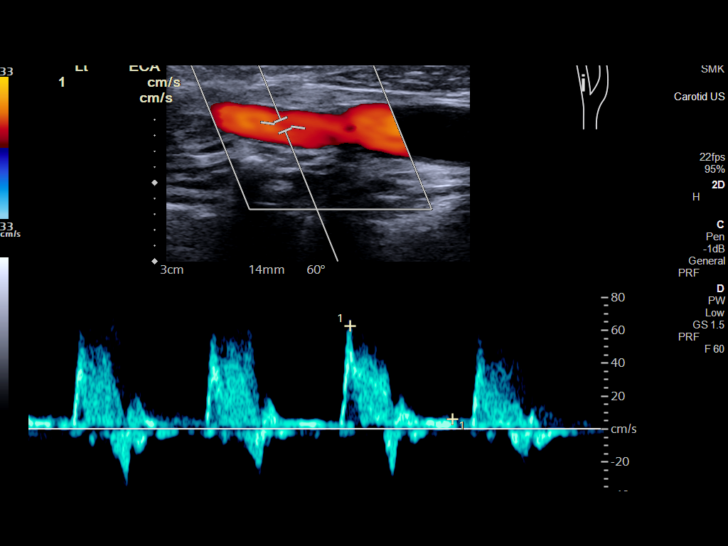
[im 56/62]
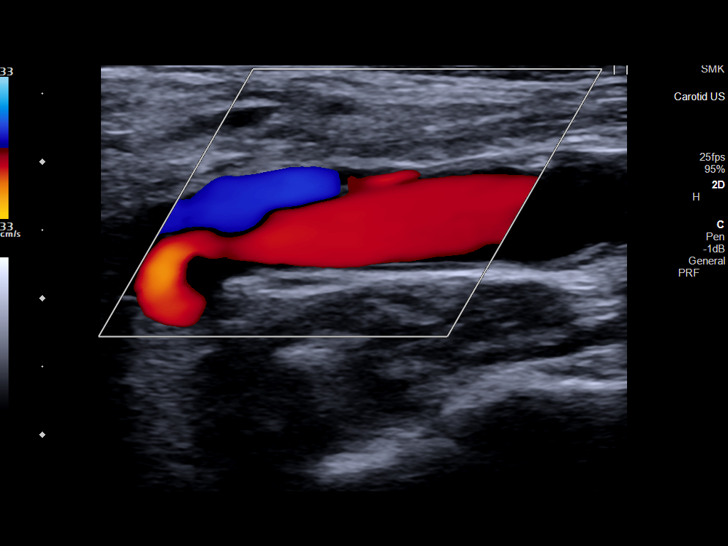
[im 62/62]
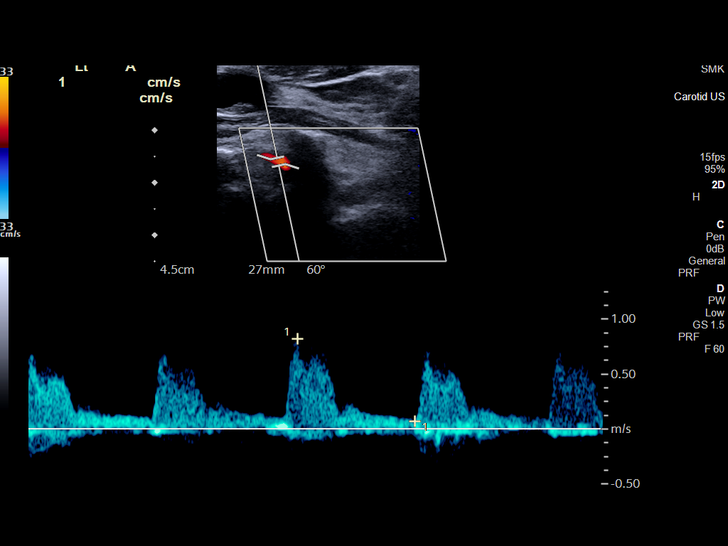

[13 of 24 positions shown; findings below may reference images not displayed]

FINDINGS: Criteria: Quantification of carotid stenosis is based on velocity
parameters that correlate the residual internal carotid diameter
with NASCET-based stenosis levels, using the diameter of the distal
internal carotid lumen as the denominator for stenosis measurement.

The following velocity measurements were obtained:

RIGHT

ICA: Peak systolic velocity 59 cm/sec, End diastolic velocity 9
cm/sec

CCA: Peak systolic velocity 74 cm/sec

SYSTOLIC ICA/CCA RATIO:

ECA: Peak systolic velocity 71 cm/sec

LEFT

ICA: Peak systolic velocity 69 cm/sec, End diastolic velocity 11
cm/sec

CCA: 58 cm/sec

SYSTOLIC ICA/CCA RATIO:

ECA: 63 cm/sec

RIGHT CAROTID ARTERY: Minimal atherosclerotic plaque formation at
the carotid bifurcation. No significant tortuosity. Normal low
resistance waveforms.

RIGHT VERTEBRAL ARTERY:  Antegrade flow.

LEFT CAROTID ARTERY: Minimal atherosclerotic plaque formation at the
carotid bifurcation. No significant tortuosity. Normal low
resistance waveforms.

LEFT VERTEBRAL ARTERY:  Antegrade flow.

Upper extremity non-invasive blood pressures:

Not obtained.
IMPRESSION: 1. Right carotid artery system: Patent without significant
atherosclerotic plaque formation.

2. Left carotid artery system: Patent without significant
atherosclerotic plaque formation.

3.  Vertebral artery system: Patent with antegrade flow bilaterally.

## 2020-04-23 MED ORDER — CHLORHEXIDINE GLUCONATE CLOTH 2 % EX PADS
6.0000 | MEDICATED_PAD | Freq: Every day | CUTANEOUS | Status: DC
  Administered 2020-04-23: 6 via TOPICAL

## 2020-04-23 MED ORDER — ASPIRIN 81 MG PO TBEC
81.0000 mg | DELAYED_RELEASE_TABLET | Freq: Every day | ORAL | 2 refills | Status: DC
Start: 2020-04-24 — End: 2020-05-03

## 2020-04-23 MED ORDER — ACETAMINOPHEN 325 MG PO TABS: 650.0000 mg | ORAL_TABLET | Freq: Four times a day (QID) | ORAL | Status: AC | PRN

## 2020-04-23 MED ORDER — AMLODIPINE BESYLATE 10 MG PO TABS: 10.0000 mg | ORAL_TABLET | Freq: Every day | ORAL | 0 refills | Status: DC

## 2020-04-23 MED ORDER — CLOPIDOGREL BISULFATE 75 MG PO TABS: 75.0000 mg | ORAL_TABLET | Freq: Every day | ORAL | 0 refills | Status: DC

## 2020-04-23 MED ORDER — CLOPIDOGREL BISULFATE 75 MG PO TABS
75.0000 mg | ORAL_TABLET | Freq: Every day | ORAL | Status: DC
  Administered 2020-04-23: 75 mg via ORAL
  Filled 2020-04-23: qty 1

## 2020-04-23 NOTE — Discharge Summary (Signed)
Mariah Thornton:[id number] DOB: [birth year] DOA: 04/20/2020  PCP: Philmore Pali, NP  Admit date: 04/20/2020 Discharge date: 04/23/2020  Admitted From: home Disposition:  home  Recommendations for Outpatient Follow-up:  1. Follow up with PCP in 1 week 2. Please obtain BMP/CBC in one week 3. Dr. Mike Gip next week 4. Dr. Manuella Ghazi neurology in 1-2 weeks  Home Health:yes    Discharge Condition:Stable CODE STATUS:partial DNR  Diet recommendation:Regular    Brief/Interim Summary: Mariah Thornton is a 74 y.o. 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 Ammonia level 16.  CT head showed no acute abnormality.      Somnolence -likely multifactorial from pain meds, decrease po intake causing dehydration Metabolic encephalopahty. EEG done abnormal electroencephalogram secondary to the presence of frequent periods of slowing that are more prominent frontally (FIRDA) and triphasic waves. These findings may be seen with a diffusecerebraldisturbance that is etiologically nonspecific, but may include a metabolic encephalopathy, among other possibilities -CT head negative MRI brain- see full report. 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  Will need to follow neurology as outpatient -may need link monitor as outpt-follow-up with PCP for referral Carotid doppler no significant stenosis  Bilateral lower extremity edema LE doppler neg for dvt Given lasix in er.     Renal cell carcinoma of right  kidney (HCC) Bone metastases ([place]um[place] spine MRI revealed widespread osseous mets in spine and pelvis.  Mild pathologic compression fx of L2-new Oncology following Palliative care was consulted Pt is day 15 s/p cycle #1 pembrolizumab Pt began axitinib on 04/15/20 axitinib on hold 2/2 potential side effects May be elligible for palliative radiation to symptomatic bone mets, will f/u with oncology next tuesday  Hypertension -continue with amlodipine  Hyperkalemia Mild, and resolved with ivf and lasix   Low back pain -spoke to [place] husband, to give small doses of the pain med to prevent further somnolence and can take tylenol prn.  [facility]charge Diagnoses:  Principal Problem:   Somnolence Active Problems:   Renal cell carcinoma of right kidney (HCC)   Bone metastases (HCC)   Hypertension   Hyperkalemia   Bilateral lower extremity edema   Low back pain   Palliative care encounter    Discharge Instructions  Discharge Instructions    Call MD for:  persistant nausea and vomiting   Complete by: As directed    Call MD for:  severe uncontrolled pain   Complete by: As directed    Diet - low sodium heart healthy   Complete by: As directed    Discharge instructions   Complete by: As directed    Take aspirin and plavix toghter for 3 weeks , then only aspirin there after.  F/u with pcp next week F/u with oncology   Increase activity slowly   Complete by: As directed      Allergies as of 04/23/2020   No Known Allergies     Medication List    STOP taking these medications   hydrOXYzine  25 MG tablet Commonly known as: ATARAX/VISTARIL   traMADol 50 MG tablet Commonly known as: ULTRAM     TAKE these medications   acetaminophen 325 MG tablet Commonly known as: TYLENOL Take 2 tablets (650 mg total) by mouth every 6 (six) hours as needed for mild pain (or Fever >/= 101). Notes to patient: Not given while in hospital.     amLODipine 10 MG tablet Commonly known as: NORVASC Take 1 tablet (10 mg total) by mouth daily. Start taking on: April 24, 2020 Notes to patient: Last dose given 04/23/2020 at 08:07am   aspirin 81 MG EC tablet Take 1 tablet (81 mg total) by mouth daily. Swallow whole. Start taking on: April 24, 2020 Notes to patient: Last dose given 04/23/2020 at 08:07am   axitinib 5 MG tablet Commonly known as: INLYTA Take 1 tablet (5 mg total) by mouth 2 (two) times daily. Notes to patient: Not given while in hospital.    clopidogrel 75 MG tablet Commonly known as: PLAVIX Take 1 tablet (75 mg total) by mouth daily for 21 days. Start taking on: April 24, 2020 Notes to patient: Last dose given 91/63/8466 at 59:93TT   folic acid 1 MG tablet Commonly known as: FOLVITE Take 1 mg by mouth daily. Notes to patient: Not given while in hospital.    HYDROcodone-acetaminophen 5-325 MG tablet Commonly known as: NORCO/VICODIN Take 1/2 or 1 pill every 6 hours as needed for pain. Notes to patient: Not given while in hospital.    lidocaine-prilocaine cream Commonly known as: EMLA Apply to affected area once Notes to patient: Not given while in hospital.    ondansetron 8 MG tablet Commonly known as: Zofran Take 1 tablet (8 mg total) by mouth 2 (two) times daily as needed (Nausea or vomiting). Notes to patient: Not given while in hospital.             Durable Medical Equipment  (From admission, onward)         Start     Ordered   04/23/20 1303  For home use only DME Bedside commode  Once       Question:  Patient needs a bedside commode to treat with the following condition  Answer:  Weakness   04/23/20 1303          Follow-up Information    [facility].   Specialty: Emergency Medicine Why: If symptoms worsen Contact information: [place] [id number] [address] [place]       Lequita Asal, MD. Go in 1 day.   Specialty: Hematology and Oncology Contact information: [place] [address] [phone]        Vladimir Crofts, MD Follow up in 1 week(s).   Specialty: Neurology Contact information: [facility] [place] [address] [phone]        Philmore Pali, NP Follow up in 1 week(s).   Specialty: Nurse Practitioner Contact information: [place] [address] [phone]              No Known Allergies  Consultations:  Neurology, oncology   Procedures/Studies: EEG  Result Date: 04/22/2020 Alexis Goodell, MD     04/22/2020  7:10 PM ELECTROENCEPHALOGRAM REPORT Patient: TALAH COOKSTON       Room #: 129A-AA EEG No. ID: [id number] Age: 74 y.o.        Sex: female Requesting Physician: Kurtis Bushman Report Date:  04/22/2020       Interpreting Physician: Alexis Goodell History: JUNIA NYGREN is an 74 y.o. female with altered mental status Medications: Norvasc, ASA Conditions of Recording:  This is a 21 channel routine scalp EEG performed with bipolar and monopolar montages arranged in accordance to the international 10/20 system of electrode placement. One channel was dedicated to EKG recording. The patient is in the lethargic state. Description:  The background activity is not continuous.  There are short periods when an alpha background rhythm can be achieved but these alternate with frequent, generalized periods of polymorphic delta activity that is most prominent frontally and has the characteristics of FIRDA.  These periods last from 5-6 seconds.  There are also noted intermittent discharges of triphasic morphology.  Hyperventilation was not performed.  Intermittent photic stimulation was performed but failed to illicit any change in the tracing.  IMPRESSION: This is an abnormal electroencephalogram secondary to the presence of frequent periods of slowing that are more prominent  frontally (FIRDA) and triphasic waves.  These findings may be seen with a diffuse cerebral disturbance that is etiologically nonspecific, but may include a metabolic encephalopathy, among other possibilities.  Alexis Goodell, MD Neurology 04/22/2020, 7:05 PM   DG Chest 2 View  Result Date: 04/20/2020 CLINICAL DATA:  74 year old female with weakness. Bilateral lower extremity swelling. Metastatic renal cell carcinoma. EXAM: CHEST - 2 VIEW COMPARISON:  Chest CT 03/03/2020 and earlier. FINDINGS: Lower lung volumes. Right chest power port. Stable mediastinal contours. Small bilateral pleural effusions. Increased bilateral pulmonary interstitial opacity, with nodular pulmonary metastasis most visible at the left lung base. Paucity of bowel gas in the upper abdomen. Degenerative osseous changes. No destructive osseous lesion identified radiographically. IMPRESSION: 1. Pulmonary metastatic disease re-demonstrated with new small pleural effusions since August. 2. Paucity of bowel gas in the upper abdomen.  Query ascites. Electronically Signed   By: Genevie Ann M.D.   On: 04/20/2020 22:24   CT Head Wo Contrast  Result Date: 04/20/2020 CLINICAL DATA:  Mental status changes, history of renal cell carcinoma EXAM: CT HEAD WITHOUT CONTRAST TECHNIQUE: Contiguous axial images were obtained from the base of the skull through the vertex without intravenous contrast. COMPARISON:  02/26/2020 FINDINGS: Brain: Mild atrophic changes and chronic white matter ischemic changes are seen stable from the prior exam. No findings to suggest acute hemorrhage, acute infarction or space-occupying mass lesion are noted. Vascular: No hyperdense vessel or unexpected calcification. Skull: Normal. Negative for fracture or focal lesion. Sinuses/Orbits: No acute finding. Other: Scalp soft tissue nodule is noted on the left near the vertex stable from the prior exam. IMPRESSION: Chronic atrophic and ischemic changes without acute abnormality. Stable  soft tissue scalp lesion near the vertex on the left. Smaller nodule is noted in the midline anteriorly. Electronically Signed   By: Inez Catalina M.D.   On: 04/20/2020 23:03   MR BRAIN WO CONTRAST  Result Date: 04/21/2020 CLINICAL DATA:  Mental status change, unknown cause. 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 EXAM: MRI HEAD WITHOUT CONTRAST TECHNIQUE: Multiplanar, multiecho pulse sequences of the brain and surrounding structures were obtained without intravenous contrast. COMPARISON:  Noncontrast head CT 04/20/2020. FINDINGS: Brain: Stable, mild generalized cerebral atrophy. There are subcentimeter foci of restricted diffusion within the posterior right frontal lobe white matter (series 2, image 40), within the right frontoparietal white matter (series 2, image 38), within the left parietal  lobe (series 2, image 37). An additional subcentimeter focus of restricted diffusion is questioned within the left posterior frontal lobe white matter (series 2, image 37). Advanced multifocal T2/FLAIR hyperintensity within the cerebral white matter which is nonspecific, but consistent with chronic small vessel ischemic disease. 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 No chronic intracranial blood products. No extra-axial fluid collection. No midline shift. Vascular: Expected proximal arterial flow voids. Skull and upper cervical spine: No focal marrow lesion. Sinuses/Orbits: Visualized orbits show no acute finding. Mild ethmoid and maxillary sinus mucosal thickening. No significant mastoid effusion. Other: Again demonstrated is a 3.2 cm left parietal scalp mass (series 6, image 17). Also redemonstrated is a subcentimeter nodular lesion within the midline anterior scalp (series 6, image 12). IMPRESSION: Subcentimeter foci of restricted diffusion within  the right frontoparietal white matter and left parietal white matter (3 sites total). An additional subcentimeter focus of restricted diffusion is questioned within the posterior left frontal lobe white matter. These foci may reflect acute infarcts and are suspicious for an embolic process. Given the history of recently diagnosed renal cell carcinoma, tiny metastases cannot be excluded and follow-up contrast-enhanced MR imaging should be considered. Mild generalized cerebral atrophy. Advanced cerebral white matter chronic small vessel ischemic disease. 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 Redemonstrated 3.2 cm left parietal scalp mass as well as subcentimeter nodular lesion within the midline anterior scalp. Correlate with direct visualization and consider tissue sampling if not already performed. Electronically Signed   By: Kellie Simmering DO   On: 04/21/2020 11:39   MR BRAIN W CONTRAST  Result Date: 04/21/2020 CLINICAL DATA:  74 year old female with altered mental status. Metastatic renal cell carcinoma. Multiple small embolic infarcts versus less likely small hyperdense metastases, suspected in the hemispheres on MRI earlier today. EXAM: MRI HEAD WITH CONTRAST TECHNIQUE: Multiplanar, multiecho pulse sequences of the brain and surrounding structures were obtained with intravenous contrast. CONTRAST:  9m GADAVIST GADOBUTROL 1 MMOL/ML IV SOLN COMPARISON:  Noncontrast brain MRI 1120 hours today. FINDINGS: No abnormal intracranial enhancement identified. No enhancement corresponding to the multiple small DWI lesions seen earlier today. No dural thickening. No intracranial mass effect. No ventriculomegaly. Basilar cisterns remain normal. The major dural venous sinuses are enhancing and appear to be patent. Negative visible cervical spine and spinal cord. Heterogeneous bone marrow signal in the calvarium but no enhancing skull lesion identified. However, there is enhancement  associated with the lobulated left vertex scalp lesion (series 28, image 14). IMPRESSION: 1. No evidence of cerebral metastatic disease. No enhancement associated with the small DWI lesions demonstrated earlier today, which are therefore most compatible with multiple small infarcts. 2. The irregular 3.2 cm left vertex scalp soft mass is enhancing. Electronically Signed   By: HGenevie AnnM.D.   On: 04/21/2020 18:46   MR Lumbar Spine W Wo Contrast  Result Date: 04/21/2020 CLINICAL DATA:  74year old female with altered mental status. Metastatic renal cell carcinoma. Staging. EXAM: MRI LUMBAR SPINE WITHOUT AND WITH CONTRAST TECHNIQUE: Multiplanar and multiecho pulse sequences of the lumbar spine were obtained without and with intravenous contrast. CONTRAST:  677mGADAVIST GADOBUTROL 1 MMOL/ML IV SOLN COMPARISON:  CT Abdomen and Pelvis 03/04/2020. FINDINGS: Segmentation:  Normal on the comparison CT. Alignment:  Stable lumbar lordosis since August. Vertebrae: Widespread spinal vertebral metastases. And much of the bony metastatic disease seems to be poorly enhancing (such as the expansile lesion in the right L1 posterior elements seen on series  13, image 7 and postcontrast series 31, image 7). The T12 vertebra appear spared. The L4 and L5 vertebra appear spared. (There is L3 spinous process tumor suspected). Scattered involvement of the visible sacrum and medial iliac bones. Mild pathologic compression fracture of L2 (series 11, image 8 is new since August. Conus medullaris and cauda equina: Conus extends to the T12 level. No lower spinal cord or conus signal abnormality. No abnormal intradural enhancement. There is mild dural thickening and/or early epidural tumor along the right posterior thecal sac at L1. No other lumbar epidural tumor is identified. The posterior element tumor at L1 is associated with some extraosseous extension into the muscle, as well as regional erector spinae muscle edema (series 11, image 8).  Paraspinal and other soft tissues: Abnormal right kidney. Abnormally thickened and nodular visible right hemidiaphragm. Bulky gallstones (series 11, image 1). Large benign left renal cyst. Evidence of generalized intra-abdominal and subcutaneous edema. Diverticulosis of large bowel in the pelvis. Disc levels: Moderate for age lumbar spine degeneration. There is mild degenerative spinal stenosis (such as at L2-L3). There is no malignant neural impingement at this time. IMPRESSION: 1. Widespread osseous metastatic disease in the visible spine and pelvis. A mild pathologic compression fracture of L2 is new since August. 2. Isolated early epidural tumor along the right posterior thecal sac at L1. No other lumbar epidural tumor. Extraosseous extension of tumor into the right erector spinae muscle at L1. No malignant neural impingement at this time. 3. Superimposed lumbar spine degeneration. Electronically Signed   By: Genevie Ann M.D.   On: 04/21/2020 18:55   NM Bone Scan Whole Body  Result Date: 04/06/2020 CLINICAL DATA:  Lower back and pelvic pain for couple weeks, history of renal cell carcinoma EXAM: NUCLEAR MEDICINE WHOLE BODY BONE SCAN TECHNIQUE: Whole body anterior and posterior images were obtained approximately 3 hours after intravenous injection of radiopharmaceutical. RADIOPHARMACEUTICALS:  20.917 mCi Technetium-49mMDP IV COMPARISON:  None Correlation: CT abdomen and pelvis 03/04/2020, CT chest 03/03/2020 FINDINGS: Abnormal tracer uptake identified at inferior RIGHT scapula, L1 vertebral body, RIGHT L2 vertebra, and distal LEFT femoral metaphysis suspicious for osseous metastatic disease. Degenerative type uptake at shoulders, sternoclavicular joints, and knees. Soft tissue distribution of tracer unremarkable. Minimal RIGHT renal tracer corresponding to known large RIGHT renal neoplasm decreased RIGHT renal function. Photopenic defect at mid LEFT kidney corresponding to large cyst on CT. IMPRESSION: Foci of  abnormal tracer uptake at the inferior RIGHT scapula, L1 and L2 vertebral bodies, and distal LEFT femoral metaphysis consistent with osseous metastases. Large LEFT renal cyst and known large RIGHT renal neoplasm with poor RIGHT renal function. Electronically Signed   By: MLavonia DanaM.D.   On: 04/06/2020 11:23   UKoreaCarotid Bilateral  Result Date: 04/23/2020 CLINICAL DATA:  74year old female with stroke-like symptoms. EXAM: BILATERAL CAROTID DUPLEX ULTRASOUND TECHNIQUE: GPearline Cablesscale imaging, color Doppler and duplex ultrasound were performed of bilateral carotid and vertebral arteries in the neck. COMPARISON:  None. FINDINGS: Criteria: Quantification of carotid stenosis is based on velocity parameters that correlate the residual internal carotid diameter with NASCET-based stenosis levels, using the diameter of the distal internal carotid lumen as the denominator for stenosis measurement. The following velocity measurements were obtained: RIGHT ICA: Peak systolic velocity 59 cm/sec, End diastolic velocity 9 cm/sec CCA: Peak systolic velocity 74 cm/sec SYSTOLIC ICA/CCA RATIO:  0.8 ECA: Peak systolic velocity 71 cm/sec LEFT ICA: Peak systolic velocity 69 cm/sec, End diastolic velocity 11 cm/sec CCA: 58 cm/sec SYSTOLIC ICA/CCA RATIO:  1.2 ECA: 63 cm/sec RIGHT CAROTID ARTERY: Minimal atherosclerotic plaque formation at the carotid bifurcation. No significant tortuosity. Normal low resistance waveforms. RIGHT VERTEBRAL ARTERY:  Antegrade flow. LEFT CAROTID ARTERY: Minimal atherosclerotic plaque formation at the carotid bifurcation. No significant tortuosity. Normal low resistance waveforms. LEFT VERTEBRAL ARTERY:  Antegrade flow. Upper extremity non-invasive blood pressures: Not obtained. IMPRESSION: 1. Right carotid artery system: Patent without significant atherosclerotic plaque formation. 2. Left carotid artery system: Patent without significant atherosclerotic plaque formation. 3.  Vertebral artery system: Patent  with antegrade flow bilaterally. Ruthann Cancer, MD [facility] Electronically Signed   By: Ruthann Cancer MD   On: 04/23/2020 11:28   PERIPHERAL VASCULAR CATHETERIZATION  Result Date: 03/28/2020 See op note  US Venous Img Lower Bilateral  Result Date: 04/21/2020 CLINICAL DATA:  Bilateral lower extremity edema, concern for deep venous thrombosis. History of right renal cell carcinoma. No injury. No pain. EXAM: BILATERAL LOWER EXTREMITY VENOUS DOPPLER ULTRASOUND TECHNIQUE: Gray-scale sonography with compression, as well as color and duplex ultrasound, were performed to evaluate the deep venous system(s) from the level of the common femoral vein through the popliteal and proximal calf veins. COMPARISON:  CT abdomen pelvis 03/04/2020. FINDINGS: VENOUS Normal compressibility of the common femoral, superficial femoral, and popliteal veins, as well as the visualized calf veins. Visualized portions of profunda femoral vein and great saphenous vein unremarkable. No filling defects to suggest DVT on grayscale or color Doppler imaging. Doppler waveforms show normal direction of venous flow, normal respiratory plasticity and response to augmentation. Limited views of the contralateral common femoral vein are unremarkable. OTHER None. Limitations: none IMPRESSION: No femoropopliteal DVT nor evidence of DVT within the visualized calf veins. If clinical symptoms are inconsistent or if there are persistent or worsening symptoms, further imaging (possibly involving the iliac veins) may be warranted. Electronically Signed   By: Iven Finn M.D.   On: 04/21/2020 02:14   ECHOCARDIOGRAM COMPLETE  Result Date: 04/06/2020    ECHOCARDIOGRAM REPORT   Patient Name:   SEANA UNDERWOOD Date of Exam: 04/06/2020 Medical Rec #:  [id number]     Height:       62.0 in Accession #:    [id number]    Weight:       150.1 lb Date of Birth:  1945/08/05      BSA:          1.692 m Patient  Age:    68 years      BP:           119/68 mmHg Patient Gender: F             HR:           88 bpm. Exam Location:  Outpatient Procedure: 2D Echo, Cardiac Doppler and Color Doppler Indications:    Chemo evaluation  History:        Patient has no prior history of Echocardiogram examinations.                 Renal cell carcinoma.  Sonographer:    Dustin Flock Referring Phys: [id number] [place] Comments: Patient is morbidly obese. Global longitudinal strain was attempted. IMPRESSIONS  1. Left ventricular ejection fraction, by estimation, is 70 to 75%. The left ventricle has hyperdynamic function. The left ventricle has no regional wall motion abnormalities. There is mild left ventricular hypertrophy. Left ventricular diastolic parameters are consistent with Grade I diastolic dysfunction (impaired relaxation).  2. Right ventricular systolic  function is normal. The right ventricular size is normal. There is normal pulmonary artery systolic pressure. The estimated right ventricular systolic pressure is 81.1 mmHg.  3. The mitral valve is grossly normal. No evidence of mitral valve regurgitation.  4. The aortic valve is tricuspid. Aortic valve regurgitation is not visualized. Aortic valve mean gradient measures 9.0 mmHg.  5. The inferior vena cava is normal in size with greater than 50% respiratory variability, suggesting right atrial pressure of 3 mmHg. Comparison(s): No prior Echocardiogram. FINDINGS  Left Ventricle: Left ventricular ejection fraction, by estimation, is 70 to 75%. The left ventricle has hyperdynamic function. The left ventricle has no regional wall motion abnormalities. The left ventricular internal cavity size was normal in size. There is mild left ventricular hypertrophy. Left ventricular diastolic parameters are consistent with Grade I diastolic dysfunction (impaired relaxation). Indeterminate filling pressures. Right Ventricle: The right ventricular size is normal. No  increase in right ventricular wall thickness. Right ventricular systolic function is normal. There is normal pulmonary artery systolic pressure. The tricuspid regurgitant velocity is 1.59 m/s, and  with an assumed right atrial pressure of 3 mmHg, the estimated right ventricular systolic pressure is 91.4 mmHg. Left Atrium: Left atrial size was normal in size. Right Atrium: Right atrial size was normal in size. Pericardium: There is no evidence of pericardial effusion. Mitral Valve: The mitral valve is grossly normal. No evidence of mitral valve regurgitation. Tricuspid Valve: The tricuspid valve is grossly normal. Tricuspid valve regurgitation is trivial. Aortic Valve: The aortic valve is tricuspid. Aortic valve regurgitation is not visualized. Aortic valve mean gradient measures 9.0 mmHg. Aortic valve peak gradient measures 18.7 mmHg. Aortic valve area, by VTI measures 2.61 cm. Pulmonic Valve: The pulmonic valve was normal in structure. Pulmonic valve regurgitation is not visualized. Aorta: The aortic root and ascending aorta are structurally normal, with no evidence of dilitation. Venous: The inferior vena cava is normal in size with greater than 50% respiratory variability, suggesting right atrial pressure of 3 mmHg. IAS/Shunts: No atrial level shunt detected by color flow Doppler.  LEFT VENTRICLE PLAX 2D LVIDd:         3.90 cm  Diastology LVIDs:         2.30 cm  LV e' medial:    6.64 cm/s LV PW:         1.20 cm  LV E/e' medial:  10.9 LV IVS:        1.10 cm  LV e' lateral:   8.70 cm/s LVOT diam:     2.00 cm  LV E/e' lateral: 8.3 LV SV:         98 LV SV Index:   58 LVOT Area:     3.14 cm  RIGHT VENTRICLE             IVC RV Basal diam:  2.00 cm     IVC diam: 1.20 cm RV S prime:     13.70 cm/s TAPSE (M-mode): 1.9 cm LEFT ATRIUM             Index       RIGHT ATRIUM          Index LA diam:        3.40 cm 2.01 cm/m  RA Area:     9.56 cm LA Vol (A2C):   61.7 ml 36.46 ml/m RA Volume:   14.20 ml 8.39 ml/m LA Vol  (A4C):   26.9 ml 15.90 ml/m LA Biplane Vol: 43.9 ml 25.94 ml/m  AORTIC  VALVE AV Area (Vmax):    2.08 cm AV Area (Vmean):   2.41 cm AV Area (VTI):     2.61 cm AV Vmax:           216.00 cm/s AV Vmean:          141.000 cm/s AV VTI:            0.375 m AV Peak Grad:      18.7 mmHg AV Mean Grad:      9.0 mmHg LVOT Vmax:         143.00 cm/s LVOT Vmean:        108.000 cm/s LVOT VTI:          0.312 m LVOT/AV VTI ratio: 0.83  AORTA Ao Root diam: 3.20 cm Ao Asc diam:  3.20 cm MITRAL VALVE               TRICUSPID VALVE MV Area (PHT): 2.99 cm    TR Peak grad:   10.1 mmHg MV Decel Time: 254 msec    TR Vmax:        159.00 cm/s MV E velocity: 72.40 cm/s MV A velocity: 88.70 cm/s  SHUNTS MV E/A ratio:  0.82        Systemic VTI:  0.31 m                            Systemic Diam: 2.00 cm Lyman Bishop MD Electronically signed by Lyman Bishop MD Signature Date/Time: 04/06/2020/3:52:03 PM    Final    ECHO TEE  Result Date: 04/22/2020    TRANSESOPHOGEAL ECHO REPORT   Patient Name:   OAKLIE DURRETT Date of Exam: 04/22/2020 Medical Rec #:  [id number]     Height:       62.0 in Accession #:    [id number]    Weight:       145.0 lb Date of Birth:  July 06, 1946      BSA:          1.667 m Patient Age:    60 years      BP:           144/129 mmHg Patient Gender: F             HR:           84 bpm. Exam Location:  [facility] Procedure: Transesophageal Echo, Cardiac Doppler and Color Doppler Indications:     Not listed on check-in form  History:         Patient has prior history of Echocardiogram examinations, most                  recent 04/06/2020. No medical history on file.  Sonographer:     Sherrie Sport RDCS (AE) Referring Phys:  [place] Diagnosing Phys: Ida Rogue MD PROCEDURE: After discussion of the risks and benefits of a TEE, an informed consent was obtained from a family member. The transesophogeal probe was passed without difficulty through the esophogus of the patient. Local oropharyngeal anesthetic was provided  with Cetacaine and viscous lidocaine. Sedation performed by performing physician. Patients was under conscious sedation during this procedure. Anesthetic administered: 71mg of Fentanyl, 2.031mof Versed. Image quality was excellent. The patient's vital signs; including heart rate, blood pressure, and oxygen saturation; remained stable throughout the procedure. The patient developed no complications during the procedure. IMPRESSIONS  1. No thrombus noted in the left atrium or left  atrial appendage.  2. Left ventricular ejection fraction, by estimation, is 60 to 65%. The left ventricle has normal function. The left ventricle has no regional wall motion abnormalities. There is mild left ventricular hypertrophy.  3. Right ventricular systolic function is normal. The right ventricular size is normal.  4. The mitral valve is normal in structure. Mild mitral valve regurgitation.  5. There is Moderate (Grade III) atheroma plaque involving the transverse and descending aorta.  6. No valve vegetation Conclusion(s)/Recommendation(s): Normal biventricular function without evidence of hemodynamically significant valvular heart disease. FINDINGS  Left Ventricle: Left ventricular ejection fraction, by estimation, is 60 to 65%. The left ventricle has normal function. The left ventricle has no regional wall motion abnormalities. The left ventricular internal cavity size was normal in size. There is  mild left ventricular hypertrophy. Right Ventricle: The right ventricular size is normal. No increase in right ventricular wall thickness. Right ventricular systolic function is normal. Left Atrium: Left atrial size was normal in size. No left atrial/left atrial appendage thrombus was detected. Right Atrium: Right atrial size was normal in size. Pericardium: There is no evidence of pericardial effusion. Mitral Valve: The mitral valve is normal in structure. Mild mitral valve regurgitation. No evidence of mitral valve stenosis. Tricuspid  Valve: The tricuspid valve is normal in structure. Tricuspid valve regurgitation is mild . No evidence of tricuspid stenosis. Aortic Valve: The aortic valve is normal in structure. Aortic valve regurgitation is not visualized. No aortic stenosis is present. Pulmonic Valve: The pulmonic valve was normal in structure. Pulmonic valve regurgitation is not visualized. No evidence of pulmonic stenosis. Aorta: The aortic root is normal in size and structure. There is moderate (Grade III) atheroma plaque involving the transverse and descending aorta. Venous: The inferior vena cava is normal in size with greater than 50% respiratory variability, suggesting right atrial pressure of 3 mmHg. IAS/Shunts: No atrial level shunt detected by color flow Doppler. Agitated saline contrast was given intravenously to evaluate for intracardiac shunting. Ida Rogue MD Electronically signed by Ida Rogue MD Signature Date/Time: 04/22/2020/1:06:47 PM    Final    DG FEMUR MIN 2 VIEWS LEFT  Result Date: 04/08/2020 CLINICAL DATA:  Left femur met seen on bone scan. Evaluate bone integrity. EXAM: LEFT FEMUR 2 VIEWS COMPARISON:  Bone scan 04/05/2020 FINDINGS: Two view exam of the left femur shows no evidence for worrisome lytic or sclerotic osseous abnormality. Patient has substantial tricompartmental degenerative change in the knee without metaphyseal lesion. Uptake on the bone scan could potentially have been degenerative. IMPRESSION: No suspicious lytic or sclerotic osseous abnormality in the metaphysis of the distal left femur to suggest metastatic disease. Marked tricompartmental degenerative changes. Degenerative disease could account for some of the uptake seen in the distal left femur. Electronically Signed   By: Misty Stanley M.D.   On: 04/08/2020 15:05      Subjective: Much more awake today and interactive.  Even cracks jokes.  Very pleasant family at bedside  Discharge Exam: Vitals:   04/23/20 0419 04/23/20 0820   BP: (!) 158/63 (!) 158/54  Pulse: 78 77  Resp: 18 20  Temp: 97.8 F (36.6 C) 98.3 F (36.8 C)  SpO2: 93% 92%   Vitals:   04/22/20 2100 04/23/20 0005 04/23/20 0419 04/23/20 0820  BP:  (!) 155/122 (!) 158/63 (!) 158/54  Pulse:  76 78 77  Resp:  _0 Temp:  98 F (36.7 C) 97.8 F (36.6 C) 98.3 F (36.8 C)  TempSrc:  Oral Oral Oral  SpO2: 95%  93% 92%  Weight:      Height:        General: Pt is alert, awake, oriented x3 not in acute distress more awake and interactive Cardiovascular: RRR, S1/S2 +, no rubs, no gallops Respiratory: CTA bilaterally, no wheezing, no rhonchi Abdominal: Soft, NT, ND, bowel sounds + Extremities: no edema, no cyanosis    The results of significant diagnostics from this hospitalization (including imaging, microbiology, ancillary and laboratory) are listed below for reference.     Microbiology: Recent Results (from the past 240 hour(s))  Respiratory Panel by RT PCR (Flu A&B, Covid) - Nasopharyngeal Swab     Status: None   Collection Time: 04/21/20  7:40 AM   Specimen: Nasopharyngeal Swab  Result Value Ref Range Status   SARS Coronavirus 2 by RT PCR NEGATIVE NEGATIVE Final    Comment: (NOTE) SARS-CoV-2 target nucleic acids are NOT DETECTED.  The SARS-CoV-2 RNA is generally detectable in upper respiratoy specimens during the acute phase of infection. The lowest concentration of SARS-CoV-2 viral copies this assay can detect is 131 copies/mL. A negative result does not preclude SARS-Cov-2 infection and should not be used as the sole basis for treatment or other patient management decisions. A negative result may occur with  improper specimen collection/handling, submission of specimen other than nasopharyngeal swab, presence of viral mutation(s) within the areas targeted by this assay, and inadequate number of viral copies (<131 copies/mL). A negative result must be combined with clinical observations, patient history, and epidemiological  information. The expected result is Negative.  Fact Sheet for Patients:  PinkCheek.be  Fact Sheet for Healthcare Providers:  GravelBags.it  This test is no t yet approved or cleared by the [place] FDA and  has been authorized for detection and/or diagnosis of SARS-CoV-2 by FDA under an Emergency Use Authorization (EUA). This EUA will remain  in effect (meaning this test can be used) for the duration of the COVID-19 declaration under Section 564(b)(1) of the Act, 21 U.S.C. section 360bbb-3(b)(1), unless the authorization is terminated or revoked sooner.     Influenza A by PCR NEGATIVE NEGATIVE Final   Influenza B by PCR NEGATIVE NEGATIVE Final    Comment: (NOTE) The Xpert Xpress SARS-CoV-2/FLU/RSV assay is intended as an aid in  the diagnosis of influenza from Nasopharyngeal swab specimens and  should not be used as a sole basis for treatment. Nasal washings and  aspirates are unacceptable for Xpert Xpress SARS-CoV-2/FLU/RSV  testing.  Fact Sheet for Patients: PinkCheek.be  Fact Sheet for Healthcare Providers: GravelBags.it  This test is not yet approved or cleared by the [place] FDA and  has been authorized for detection and/or diagnosis of SARS-CoV-2 by  FDA under an Emergency Use Authorization (EUA). This EUA will remain  in effect (meaning this test can be used) for the duration of the  Covid-19 declaration under Section 564(b)(1) of the Act, 21  U.S.C. section 360bbb-3(b)(1), unless the authorization is  terminated or revoked. Performed at [facility], [address] [place], [place] [address]      Labs: BNP (last 3 results) No results for input(s): BNP in the last 8760 hours. Basic Metabolic Panel: Recent Labs  Lab 04/20/20 1528 04/20/20 1813 04/22/20 1808 04/23/20 1027  NA 129* 132* 132* 134*  K 5.5* 5.3* 5.2* 4.3  CL  101 102 105 105  CO2 20* 20* 19* 20*  GLUCOSE 137* 114* 79 101*  BUN _0 19  CREATININE 0.91 0.85 0.83 0.86  CALCIUM 7.8* 8.2* 7.5* 7.7*   Liver Function Tests: Recent Labs  Lab 04/20/20 1528 04/20/20 1813  AST 56* 58*  ALT 12 13  ALKPHOS 118 127*  BILITOT 0.8 1.2  PROT 6.7 7.0  ALBUMIN 2.0* 2.2*   Recent Labs  Lab 04/20/20 1813  LIPASE 26   Recent Labs  Lab 04/21/20 0300  AMMONIA 16   CBC: Recent Labs  Lab 04/20/20 1528 04/20/20 1813  WBC 8.1 9.0  NEUTROABS 5.7  --   HGB 9.2* 9.6*  HCT 30.5* 31.5*  MCV 82.9 82.2  PLT 142* 158   Cardiac Enzymes: No results for input(s): CKTOTAL, CKMB, CKMBINDEX, TROPONINI in the last 168 hours. BNP: Invalid input(s): POCBNP CBG: No results for input(s): GLUCAP in the last 168 hours. D-Dimer No results for input(s): DDIMER in the last 72 hours. Hgb A1c No results for input(s): HGBA1C in the last 72 hours. Lipid Profile Recent Labs    04/23/20 0319  CHOL 126  HDL 13*  LDLCALC 83  TRIG 151*  CHOLHDL 9.7   Thyroid function studies Recent Labs    04/23/20 0319  TSH 11.410*   Anemia work up No results for input(s): VITAMINB12, FOLATE, FERRITIN, TIBC, IRON, RETICCTPCT in the last 72 hours. Urinalysis    Component Value Date/Time   COLORURINE YELLOW (A) 04/20/2020 1813   APPEARANCEUR HAZY (A) 04/20/2020 1813   LABSPEC 1.006 04/20/2020 1813   PHURINE 5.0 04/20/2020 1813   GLUCOSEU NEGATIVE 04/20/2020 1813   HGBUR SMALL (A) 04/20/2020 1813   BILIRUBINUR NEGATIVE 04/20/2020 1813   KETONESUR NEGATIVE 04/20/2020 1813   PROTEINUR NEGATIVE 04/20/2020 1813   NITRITE NEGATIVE 04/20/2020 1813   LEUKOCYTESUR NEGATIVE 04/20/2020 1813   Sepsis Labs Invalid input(s): PROCALCITONIN,  WBC,  LACTICIDVEN Microbiology Recent Results (from the past 240 hour(s))  Respiratory Panel by RT PCR (Flu A&B, Covid) - Nasopharyngeal Swab     Status: None   Collection Time: 04/21/20  7:40 AM   Specimen: Nasopharyngeal Swab   Result Value Ref Range Status   SARS Coronavirus 2 by RT PCR NEGATIVE NEGATIVE Final    Comment: (NOTE) SARS-CoV-2 target nucleic acids are NOT DETECTED.  The SARS-CoV-2 RNA is generally detectable in upper respiratoy specimens during the acute phase of infection. The lowest concentration of SARS-CoV-2 viral copies this assay can detect is 131 copies/mL. A negative result does not preclude SARS-Cov-2 infection and should not be used as the sole basis for treatment or other patient management decisions. A negative result may occur with  improper specimen collection/handling, submission of specimen other than nasopharyngeal swab, presence of viral mutation(s) within the areas targeted by this assay, and inadequate number of viral copies (<131 copies/mL). A negative result must be combined with clinical observations, patient history, and epidemiological information. The expected result is Negative.  Fact Sheet for Patients:  PinkCheek.be  Fact Sheet for Healthcare Providers:  GravelBags.it  This test is no t yet approved or cleared by the [place] FDA and  has been authorized for detection and/or diagnosis of SARS-CoV-2 by FDA under an Emergency Use Authorization (EUA). This EUA will remain  in effect (meaning this test can be used) for the duration of the COVID-19 declaration under Section 564(b)(1) of the Act, 21 U.S.C. section 360bbb-3(b)(1), unless the authorization is terminated or revoked sooner.     Influenza A by PCR NEGATIVE NEGATIVE Final   Influenza B by PCR NEGATIVE NEGATIVE Final    Comment: (NOTE) The  Xpert Xpress SARS-CoV-2/FLU/RSV assay is intended as an aid in  the diagnosis of influenza from Nasopharyngeal swab specimens and  should not be used as a sole basis for treatment. Nasal washings and  aspirates are unacceptable for Xpert Xpress SARS-CoV-2/FLU/RSV  testing.  Fact Sheet for  Patients: PinkCheek.be  Fact Sheet for Healthcare Providers: GravelBags.it  This test is not yet approved or cleared by the [place] FDA and  has been authorized for detection and/or diagnosis of SARS-CoV-2 by  FDA under an Emergency Use Authorization (EUA). This EUA will remain  in effect (meaning this test can be used) for the duration of the  Covid-19 declaration under Section 564(b)(1) of the Act, 21  U.S.C. section 360bbb-3(b)(1), unless the authorization is  terminated or revoked. Performed at [facility], [address] [place], [place] [address]      Time coordinating discharge: Over 30 minutes  SIGNED:   Nolberto Hanlon, MD  [facility] 04/23/2020, 1:32 PM Pager   If 7PM-7AM, please contact night-coverage [email or web address] Password [email or web address]

## 2020-04-23 NOTE — Progress Notes (Signed)
PT Cancellation Note  Patient Details Name: Mariah Thornton MRN: [id number] DOB: March 31, 1946   Cancelled Treatment:    Reason Eval/Treat Not Completed: Other (comment) (Pt with transport in room, PT to re-attempt as able.)  Lieutenant Diego PT, DPT 10:35 AM,04/23/20

## 2020-04-23 NOTE — Evaluation (Signed)
Physical Therapy Evaluation Patient Details Name: EVERLENA MACKLEY MRN: [id number] DOB: 30-Oct-1945 Today's Date: 04/23/2020   History of Present Illness  Pt is a 74 y.o. 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 Recently admitted for PNA, weakness, potential confusion and difficulty walking.  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    Clinical Impression  Pt alert, oriented x4, intermittently with eyes closed during session, and distractible, but easily re-directed. Per family/pt, at baseline pt is ambulatory with RW, needs assistance with ADLs, family/friends perform most cooking/cleaning/meds management. Pt has had 4 or so falls in the last 6 months.  The patient was able to perform log rolling with minA to come sit EOB, cueing for step by step sequencing to attempt to minimize back pain. Sit <> stand several times during session, with repetition and RW, able to perform with CGA. Pt's husband instructed in gait belt use and positioning for transfers, verbalized understanding. The patient was able to ambulate ~35ft with RW and CGA, tactile cues for RW management as well as upright posture and positioning of RW. Tended to ambulate to the R, difficulty navigating environment without supervision.  Overall the patient demonstrated deficits (see "PT Problem List") that impede the patient's functional abilities, safety, and mobility and would benefit from skilled PT intervention. Recommendation is HHPT with supervision/assistance for  mobility/OOB, family and pt verbalized understanding.     Follow Up Recommendations Home health PT;Supervision for mobility/OOB    Equipment Recommendations  3in1 (PT)    Recommendations for Other Services       Precautions / Restrictions Precautions Precautions: Fall Restrictions Weight Bearing Restrictions: No      Mobility  Bed Mobility Overal bed mobility: Needs Assistance Bed Mobility: Sit to Supine;Rolling;Sidelying to Sit Rolling: Min assist Sidelying to sit: Min assist;HOB elevated          Transfers Overall transfer level: Needs assistance Equipment used: Rolling walker (2 wheeled) Transfers: Sit to/from Stand Sit to Stand: Min guard;Min assist            Ambulation/Gait Ambulation/Gait assistance: Min guard Gait Distance (Feet): 70 Feet (chair follow) Assistive device: Rolling walker (2 wheeled)       General Gait Details: Pt tended to ambulate with RW forward outside BOS. cueing and tactile cues to assist with RW management. Pt needed a few self directed/PT directed standing breaks  Stairs            Wheelchair Mobility    Modified Rankin (Stroke Patients Only)       Balance Overall balance assessment: Needs assistance Sitting-balance support: Feet supported Sitting balance-Leahy Scale: Fair       Standing balance-Leahy Scale: Fair                               Pertinent Vitals/Pain Pain Assessment: Faces Faces Pain Scale: Hurts even more Pain Location: with mobility, in back per pt Pain Descriptors / Indicators: Aching;Guarding;Grimacing;Moaning Pain Intervention(s): Limited activity within patient's tolerance;Monitored during session;Repositioned  Home Living Family/patient expects to be discharged to:: Private residence Living Arrangements: Spouse/significant other;Non-relatives/Friends Available Help at Discharge: Family;Friend(s) Type of Home: House Home Access: Stairs to enter   [employer] of Steps: 3 in front, 1 in back, no handrails on back Home Layout: One level Home Equipment: Walker - 2 wheels Additional Comments: no more than 4 falls in the last 6 months    Prior Function Level of Independence: Needs assistance   Gait / Transfers Assistance Needed: recently ambulating with RW  ADL's / Homemaking Assistance Needed: recent help in teh last week or so for dressing, supervision for bathing, fmaily/husband assist with med management, household activities        Hand Dominance   Dominant Hand: Right    Extremity/Trunk Assessment   Upper Extremity Assessment Upper Extremity Assessment: Generalized weakness    Lower Extremity Assessment Lower Extremity Assessment: Generalized weakness    Cervical / Trunk Assessment Cervical / Trunk Assessment: Kyphotic  Communication   Communication: HOH  Cognition Arousal/Alertness: Awake/alert Behavior During Therapy: WFL for tasks assessed/performed Overall Cognitive Status: Within Functional Limits for tasks assessed                                        General Comments      Exercises     Assessment/Plan    PT Assessment Patient needs continued PT services  PT Problem List Decreased strength;Decreased mobility;Decreased safety awareness;Decreased range of motion;Decreased activity tolerance;Decreased balance;Decreased knowledge of use of DME;Pain       PT Treatment Interventions DME instruction;Therapeutic exercise;Gait training;Balance training;Stair training;Neuromuscular re-education;Functional mobility training;Therapeutic activities;Patient/family education    PT Goals (Current goals can be found in the Care Plan section)  Acute Rehab PT Goals Patient Stated Goal: to go home PT Goal Formulation: With patient Time For Goal Achievement: 05/07/20 Potential to Achieve Goals: Good    Frequency Min 2X/week   Barriers to discharge        Co-evaluation                AM-PAC PT "6 Clicks" Mobility  Outcome Measure Help needed turning from your back to your side while in a flat bed without using bedrails?: A Little Help needed moving from lying on your back to sitting on the side of a flat bed without using bedrails?: A Little Help needed moving to and from a bed to a chair (including a wheelchair)?: A Little Help needed standing up from a chair using your arms (e.g., wheelchair or bedside chair)?: A Little Help needed to walk in hospital room?: A Little Help needed climbing 3-5 steps with a railing? : A Lot 6 Click Score: 17    End of Session Equipment Utilized During Treatment: Gait belt Activity Tolerance: Patient tolerated treatment well;Patient limited by fatigue Patient left: with family/visitor present;Other (comment);in chair (in chair with OT) Nurse Communication: Mobility status PT Visit Diagnosis: Other abnormalities of gait and mobility (R26.89);Muscle weakness (generalized) (M62.81);Difficulty in walking, not elsewhere classified (R26.2);Pain Pain - Right/Left:  (midline) Pain - part of body:  (low back pain)    Time: 2505-3976 PT Time Calculation (min) (ACUTE ONLY): 32 min   Charges:   PT Evaluation $PT Eval Moderate Complexity: 1 Mod PT Treatments $Gait Training: 8-22 mins $Therapeutic Exercise: 8-22 mins       Lieutenant Diego PT, DPT 12:55 PM,04/23/20

## 2020-04-23 NOTE — TOC Transition Note (Signed)
Transition of Care [facility]) - CM/SW Discharge Note   Patient Details  Name: Mariah Thornton MRN: [id number] Date of Birth: 06-09-1946  [facility] CM/SW Contact:  Boris Sharper, LCSW Phone Number: 04/23/2020, 2:50 PM   Clinical Narrative:    Pt medically stable for discharge per MD. Pt will be transported home by her family. Pt will be followed for [facility] PT, OT, RN and Aide by [facility]. CSW arranged for 3n1 to be delivered to pt's room through [facility].    Final next level of care: Home w Home Health Services Barriers to Discharge: No Barriers Identified   Patient Goals and CMS Choice        Discharge Placement                  Name of family member notified: [name] Patient and family notified of of transfer: 04/23/20  Discharge Plan and Services                DME Arranged: 3-N-1 DME Agency: [employer] Date DME Agency Contacted: 04/23/20 Time DME Agency Contacted: [phone] Representative spoke with at DME Agency: [place]: PT, OT, RN, Nurse's Aide HH Agency: [place] Date [place]: 04/23/20 Time [place]: 1448 Representative spoke with at [place]: [place] ([place]) Interventions     Readmission Risk Interventions No flowsheet data found.

## 2020-04-23 NOTE — Progress Notes (Signed)
Pt d/c home via private vehicle.  D/c paperwork was reviewed with pt and family member [name] who expressed understanding.  IV removed from pts R AC without issue.  NAD noted at d/c.  All belongings taken at time of d/c.

## 2020-04-23 NOTE — Evaluation (Signed)
Occupational Therapy Evaluation Patient Details Name: Mariah Thornton MRN: [id number] DOB: 1945-11-24 Today's Date: 04/23/2020    History of Present Illness Pt is a 74 y.o. 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 Recently admitted for PNA, weakness, potential confusion and difficulty walking.  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   Clinical Impression   Mariah Thornton was seen for OT evaluation this date. Prior to hospital admission, pt was MOD I for mobility, requiring assist for IADLs. Pt lives c husband and family friends available 24/7. Pt presents to acute OT demonstrating impaired ADL performance and functional mobility 2/2 decreased LB access, functional strength/balance deficts. Pt currently requires MAX A for LBD seated EOB. MIN A for ADL t/f. MOD I for grooming at bed level. Extensive caregiver education in DME recs, d/c recs, home/routines modifications, importance of mobility for funcitonal strengthening, toileting schedule, falls prevention, and ECS. Pt would benefit from skilled OT to address noted impairments and functional limitations (see below for any additional details) in order to maximize safety and independence while minimizing falls risk and caregiver burden. Upon hospital discharge, recommend HHOT to maximize pt safety and return to functional independence during meaningful occupations of daily life.     Follow Up Recommendations  Home health OT;Supervision/Assistance - 24 hour    Equipment Recommendations   3 in 1 bedside commode    Recommendations for Other Services       Precautions / Restrictions Precautions Precautions: Fall Restrictions Weight Bearing Restrictions: No      Mobility Bed Mobility Overal bed mobility: Needs Assistance   Sit to supine: Mod assist   General bed mobility comments: assist for BLE return to bed  Transfers Overall transfer level: Needs assistance Equipment used: Rolling walker (2 wheeled) Transfers: Sit to/from Stand Sit to Stand: Min assist         Balance Overall balance assessment: Needs assistance Sitting-balance support: Feet supported Sitting balance-Mariah Thornton: Fair     Standing balance support: Bilateral upper extremity supported Standing balance-Mariah Thornton: Poor Standing balance comment: multiple minor posterior LOBs noted, able to self correct            ADL either performed or assessed with clinical judgement   ADL Overall ADL's : Needs assistance/impaired        General ADL Comments: MAX A for LBD seated EOB. MIN A for ADL t/f. MOD I for grooming at bed level                   Pertinent Vitals/Pain Pain Assessment: Faces Faces Pain Thornton: Hurts little more Pain Location: with mobility, in back per pt Pain Descriptors / Indicators: Aching;Guarding;Grimacing;Moaning Pain Intervention(s): Repositioned;Limited activity within patient's tolerance;Monitored during session     Hand Dominance Right   Extremity/Trunk Assessment Upper Extremity Assessment Upper Extremity Assessment: Generalized weakness   Lower Extremity Assessment Lower Extremity Assessment: Generalized weakness   Cervical / Trunk Assessment Cervical / Trunk Assessment: Kyphotic   Communication Communication Communication: HOH   Cognition Arousal/Alertness: Awake/alert Behavior During Therapy: WFL for tasks  assessed/performed Overall Cognitive Status: Within Functional Limits for tasks assessed             General Comments        Exercises Exercises: Other exercises Other Exercises Other Exercises: Pt and family educated re: OT role, DME recs, d/c recs, home/routines modifications, importance of mobility for funcitonal strengthening, toileting schedule, falls prevention, ECS Other Exercises: sit<>stand, sit>sup, sitting/standing balance/tolerance, LBD, simulated UBD/hair brushing   Shoulder Instructions      Home Living Family/patient expects to be discharged to:: Private residence Living Arrangements: Spouse/significant other;Non-relatives/Friends Available Help at Discharge: Family;Friend(s) Type of Home: House Home Access: Stairs to enter [employer] of Steps: 3 in front, 1 in back, no handrails on back   Home Layout: One level     Bathroom Shower/Tub: Occupational psychologist: Standard Bathroom Accessibility: Yes   Home Equipment: Environmental consultant - 2 wheels   Additional Comments: no more than 4 falls in the last 6 months      Prior Functioning/Environment Level of Independence: Needs assistance  Gait / Transfers Assistance Needed: recently ambulating with RW ADL's / Homemaking Assistance Needed: recent help in the last week for dressing, supervision for bathing, fmaily/husband assist with med management, household activities            OT Problem List: Decreased strength;Decreased activity tolerance;Decreased range of motion;Impaired balance (sitting and/or standing)      OT Treatment/Interventions: Self-care/ADL training;Therapeutic exercise;DME and/or AE instruction;Energy conservation;Therapeutic activities;Patient/family education;Balance training    OT Goals(Current goals can be found in the care plan section) Acute Rehab OT Goals Patient Stated Goal: to go home OT Goal Formulation: With patient/family Time For Goal Achievement: 05/07/20 Potential to Achieve Goals: Good ADL Goals Pt Will Perform Grooming: with min assist;standing (c LRAD PRN) Pt Will Transfer to Toilet:  ambulating;with supervision;bedside commode (c LRAD PRN)  OT Frequency: Min 1X/week    AM-PAC OT "6 Clicks" Daily Activity     Outcome Measure Help from another person eating meals?: None Help from another person taking care of personal grooming?: A Little Help from another person toileting, which includes using toliet, bedpan, or urinal?: A Little Help from another person bathing (including washing, rinsing, drying)?: A Lot Help from another person to put on and taking off regular upper body clothing?: None Help from another person to put on and taking off regular lower body clothing?: A Lot 6 Click Score: 18   End of Session Equipment Utilized During Treatment: Rolling walker Nurse Communication: Mobility status  Activity Tolerance: Patient tolerated treatment well Patient left: in bed;with call bell/phone within reach;with bed alarm set;with family/visitor present  OT Visit Diagnosis: Other abnormalities of gait and mobility (R26.89);Muscle weakness (generalized) (M62.81)                Time: 7858-8502 OT Time Calculation (min): 21 min Charges:  OT General Charges $OT Visit: 1 Visit OT Evaluation $OT Eval Low Complexity: 1 Low OT Treatments $Self Care/Home Management : 8-22 mins  Dessie Coma, M.S. OTR/L  04/23/20, 1:37 PM  ascom [phone]

## 2020-04-23 NOTE — Plan of Care (Signed)
Pt's husband is hard of hearing and wants to be here with wife to visit, however, a family member must come with him to help explain to him what is going on.  Family would like permission to do this please. Problem: Clinical Measurements: Goal: Ability to maintain clinical measurements within normal limits will improve Outcome: Progressing Goal: Will remain free from infection Outcome: Progressing Goal: Diagnostic test results will improve Outcome: Progressing Goal: Respiratory complications will improve Outcome: Progressing Goal: Cardiovascular complication will be avoided Outcome: Progressing   Problem: Activity: Goal: Risk for activity intolerance will decrease Outcome: Progressing   Problem: Nutrition: Goal: Adequate nutrition will be maintained Outcome: Progressing   Problem: Coping: Goal: Level of anxiety will decrease Outcome: Progressing   Problem: Elimination: Goal: Will not experience complications related to bowel motility Outcome: Progressing Goal: Will not experience complications related to urinary retention Outcome: Progressing   Problem: Pain Managment: Goal: General experience of comfort will improve Outcome: Progressing   Problem: Safety: Goal: Ability to remain free from injury will improve Outcome: Progressing   Problem: Skin Integrity: Goal: Risk for impaired skin integrity will decrease Outcome: Progressing

## 2020-04-23 NOTE — Progress Notes (Signed)
Subjective:  Much more awake today. Following commands.  Objective: Current vital signs: BP (!) 158/54 (BP Location: Left Arm)   Pulse 77   Temp 98.3 F (36.8 C) (Oral)   Resp 20   Ht 5\' 2"  (1.575 m)   Wt 65.8 kg   SpO2 92%   BMI 26.52 kg/m  Vital signs in last 24 hours: Temp:  [97.5 F (36.4 C)-98.4 F (36.9 C)] 98.3 F (36.8 C) (10/16 0820) Pulse Rate:  [67-84] 77 (10/16 0820) Resp:  [18-25] 20 (10/16 0820) BP: (127-177)/(53-122) 158/54 (10/16 0820) SpO2:  [92 %-100 %] 92 % (10/16 0820)  Intake/Output from previous day: 10/15 0701 - 10/16 0700 In: 240 [P.O.:240] Out: -  Intake/Output this shift: No intake/output data recorded. Nutritional status:  Diet Order            Diet Heart Room service appropriate? Yes; Fluid consistency: Thin  Diet effective now                 Neurologic Exam: Mental Status: arrousable opens her eyes  Cranial Nerves: II: Unable to test III,IV, VI: Corneal response intact V,VII: face symmetric VIII: hearing normal bilaterally IX,X: gag reflex present XI: Unable to test XII: Unable to test Motor: Some generalized weakness noted.  Able to lift both upper extremities but does not lift the lower extremities  Sensory: Responds to light noxious stimuli througout Deep Tendon Reflexes: Symmetric throughout Plantars: Right: mute                              Left: mute Cerebellar: Unable to test  Lab Results: Basic Metabolic Panel: Recent Labs  Lab 04/20/20 1528 04/20/20 1528 04/20/20 1813 04/22/20 1808 04/23/20 1027  NA 129*  --  132* 132* 134*  K 5.5*  --  5.3* 5.2* 4.3  CL 101  --  102 105 105  CO2 20*  --  20* 19* 20*  GLUCOSE 137*  --  114* 79 101*  BUN 22  --  21 22 19   CREATININE 0.91  --  0.85 0.83 0.86  CALCIUM 7.8*   < > 8.2* 7.5* 7.7*   < > = values in this interval not displayed.    Liver Function Tests: Recent Labs  Lab 04/20/20 1528 04/20/20 1813  AST 56* 58*  ALT 12 13  ALKPHOS 118 127*  BILITOT  0.8 1.2  PROT 6.7 7.0  ALBUMIN 2.0* 2.2*   Recent Labs  Lab 04/20/20 1813  LIPASE 26   Recent Labs  Lab 04/21/20 0300  AMMONIA 16    CBC: Recent Labs  Lab 04/20/20 1528 04/20/20 1813  WBC 8.1 9.0  NEUTROABS 5.7  --   HGB 9.2* 9.6*  HCT 30.5* 31.5*  MCV 82.9 82.2  PLT 142* 158    Cardiac Enzymes: No results for input(s): CKTOTAL, CKMB, CKMBINDEX, TROPONINI in the last 168 hours.  Lipid Panel: Recent Labs  Lab 04/23/20 0319  CHOL 126  TRIG 151*  HDL 13*  CHOLHDL 9.7  VLDL 30  LDLCALC 83    CBG: No results for input(s): GLUCAP in the last 168 hours.  Microbiology: Results for orders placed or performed during the hospital encounter of 04/20/20  Respiratory Panel by RT PCR (Flu A&B, Covid) - Nasopharyngeal Swab     Status: None   Collection Time: 04/21/20  7:40 AM   Specimen: Nasopharyngeal Swab  Result Value Ref Range Status   SARS Coronavirus  2 by RT PCR NEGATIVE NEGATIVE Final    Comment: (NOTE) SARS-CoV-2 target nucleic acids are NOT DETECTED.  The SARS-CoV-2 RNA is generally detectable in upper respiratoy specimens during the acute phase of infection. The lowest concentration of SARS-CoV-2 viral copies this assay can detect is 131 copies/mL. A negative result does not preclude SARS-Cov-2 infection and should not be used as the sole basis for treatment or other patient management decisions. A negative result may occur with  improper specimen collection/handling, submission of specimen other than nasopharyngeal swab, presence of viral mutation(s) within the areas targeted by this assay, and inadequate number of viral copies (<131 copies/mL). A negative result must be combined with clinical observations, patient history, and epidemiological information. The expected result is Negative.  Fact Sheet for Patients:  PinkCheek.be  Fact Sheet for Healthcare Providers:  GravelBags.it  This test  is no t yet approved or cleared by the [place] FDA and  has been authorized for detection and/or diagnosis of SARS-CoV-2 by FDA under an Emergency Use Authorization (EUA). This EUA will remain  in effect (meaning this test can be used) for the duration of the COVID-19 declaration under Section 564(b)(1) of the Act, 21 U.S.C. section 360bbb-3(b)(1), unless the authorization is terminated or revoked sooner.     Influenza A by PCR NEGATIVE NEGATIVE Final   Influenza B by PCR NEGATIVE NEGATIVE Final    Comment: (NOTE) The Xpert Xpress SARS-CoV-2/FLU/RSV assay is intended as an aid in  the diagnosis of influenza from Nasopharyngeal swab specimens and  should not be used as a sole basis for treatment. Nasal washings and  aspirates are unacceptable for Xpert Xpress SARS-CoV-2/FLU/RSV  testing.  Fact Sheet for Patients: PinkCheek.be  Fact Sheet for Healthcare Providers: GravelBags.it  This test is not yet approved or cleared by the [place] FDA and  has been authorized for detection and/or diagnosis of SARS-CoV-2 by  FDA under an Emergency Use Authorization (EUA). This EUA will remain  in effect (meaning this test can be used) for the duration of the  Covid-19 declaration under Section 564(b)(1) of the Act, 21  U.S.C. section 360bbb-3(b)(1), unless the authorization is  terminated or revoked. Performed at [facility], [address] [place], [place] [address]     Coagulation Studies: No results for input(s): LABPROT, INR in the last 72 hours.  Imaging: EEG  Result Date: 04/22/2020 Alexis Goodell, MD     04/22/2020  7:10 PM ELECTROENCEPHALOGRAM REPORT Patient: LEXIA VANDEVENDER       Room #: 129A-AA EEG No. ID: [id number] Age: 74 y.o.        Sex: female Requesting Physician: Amery Report Date:  04/22/2020       Interpreting Physician: Alexis Goodell History: NEVE BRANSCOMB is an 74 y.o. female with altered  mental status Medications: Norvasc, ASA Conditions of Recording:  This is a 21 channel routine scalp EEG performed with bipolar and monopolar montages arranged in accordance to the international 10/20 system of electrode placement. One channel was dedicated to EKG recording. The patient is in the lethargic state. Description:  The background activity is not continuous.  There are short periods when an alpha background rhythm can be achieved but these alternate with frequent, generalized periods of polymorphic delta activity that is most prominent frontally and has the characteristics of FIRDA.  These periods last from 5-6 seconds.  There are also noted intermittent discharges of triphasic morphology.  Hyperventilation was not performed.  Intermittent photic stimulation was performed  but failed to illicit any change in the tracing.  IMPRESSION: This is an abnormal electroencephalogram secondary to the presence of frequent periods of slowing that are more prominent frontally (FIRDA) and triphasic waves.  These findings may be seen with a diffuse cerebral disturbance that is etiologically nonspecific, but may include a metabolic encephalopathy, among other possibilities.  Alexis Goodell, MD Neurology 04/22/2020, 7:05 PM   MR BRAIN W CONTRAST  Result Date: 04/21/2020 CLINICAL DATA:  74 year old female with altered mental status. Metastatic renal cell carcinoma. Multiple small embolic infarcts versus less likely small hyperdense metastases, suspected in the hemispheres on MRI earlier today. EXAM: MRI HEAD WITH CONTRAST TECHNIQUE: Multiplanar, multiecho pulse sequences of the brain and surrounding structures were obtained with intravenous contrast. CONTRAST:  30mL GADAVIST GADOBUTROL 1 MMOL/ML IV SOLN COMPARISON:  Noncontrast brain MRI 1120 hours today. FINDINGS: No abnormal intracranial enhancement identified. No enhancement corresponding to the multiple small DWI lesions seen earlier today. No dural thickening. No  intracranial mass effect. No ventriculomegaly. Basilar cisterns remain normal. The major dural venous sinuses are enhancing and appear to be patent. Negative visible cervical spine and spinal cord. Heterogeneous bone marrow signal in the calvarium but no enhancing skull lesion identified. However, there is enhancement associated with the lobulated left vertex scalp lesion (series 28, image 14). IMPRESSION: 1. No evidence of cerebral metastatic disease. No enhancement associated with the small DWI lesions demonstrated earlier today, which are therefore most compatible with multiple small infarcts. 2. The irregular 3.2 cm left vertex scalp soft mass is enhancing. Electronically Signed   By: Genevie Ann M.D.   On: 04/21/2020 18:46   MR Lumbar Spine W Wo Contrast  Result Date: 04/21/2020 CLINICAL DATA:  74 year old female with altered mental status. Metastatic renal cell carcinoma. Staging. EXAM: MRI LUMBAR SPINE WITHOUT AND WITH CONTRAST TECHNIQUE: Multiplanar and multiecho pulse sequences of the lumbar spine were obtained without and with intravenous contrast. CONTRAST:  67mL GADAVIST GADOBUTROL 1 MMOL/ML IV SOLN COMPARISON:  CT Abdomen and Pelvis 03/04/2020. FINDINGS: Segmentation:  Normal on the comparison CT. Alignment:  Stable lumbar lordosis since August. Vertebrae: Widespread spinal vertebral metastases. And much of the bony metastatic disease seems to be poorly enhancing (such as the expansile lesion in the right L1 posterior elements seen on series 13, image 7 and postcontrast series 31, image 7). The T12 vertebra appear spared. The L4 and L5 vertebra appear spared. (There is L3 spinous process tumor suspected). Scattered involvement of the visible sacrum and medial iliac bones. Mild pathologic compression fracture of L2 (series 11, image 8 is new since August. Conus medullaris and cauda equina: Conus extends to the T12 level. No lower spinal cord or conus signal abnormality. No abnormal intradural  enhancement. There is mild dural thickening and/or early epidural tumor along the right posterior thecal sac at L1. No other lumbar epidural tumor is identified. The posterior element tumor at L1 is associated with some extraosseous extension into the muscle, as well as regional erector spinae muscle edema (series 11, image 8). Paraspinal and other soft tissues: Abnormal right kidney. Abnormally thickened and nodular visible right hemidiaphragm. Bulky gallstones (series 11, image 1). Large benign left renal cyst. Evidence of generalized intra-abdominal and subcutaneous edema. Diverticulosis of large bowel in the pelvis. Disc levels: Moderate for age lumbar spine degeneration. There is mild degenerative spinal stenosis (such as at L2-L3). There is no malignant neural impingement at this time. IMPRESSION: 1. Widespread osseous metastatic disease in the visible spine and pelvis. A mild  pathologic compression fracture of L2 is new since August. 2. Isolated early epidural tumor along the right posterior thecal sac at L1. No other lumbar epidural tumor. Extraosseous extension of tumor into the right erector spinae muscle at L1. No malignant neural impingement at this time. 3. Superimposed lumbar spine degeneration. Electronically Signed   By: Genevie Ann M.D.   On: 04/21/2020 18:55   US Carotid Bilateral  Result Date: 04/23/2020 CLINICAL DATA:  74 year old female with stroke-like symptoms. EXAM: BILATERAL CAROTID DUPLEX ULTRASOUND TECHNIQUE: Pearline Cables scale imaging, color Doppler and duplex ultrasound were performed of bilateral carotid and vertebral arteries in the neck. COMPARISON:  None. FINDINGS: Criteria: Quantification of carotid stenosis is based on velocity parameters that correlate the residual internal carotid diameter with NASCET-based stenosis levels, using the diameter of the distal internal carotid lumen as the denominator for stenosis measurement. The following velocity measurements were obtained: RIGHT ICA:  Peak systolic velocity 59 cm/sec, End diastolic velocity 9 cm/sec CCA: Peak systolic velocity 74 cm/sec SYSTOLIC ICA/CCA RATIO:  0.8 ECA: Peak systolic velocity 71 cm/sec LEFT ICA: Peak systolic velocity 69 cm/sec, End diastolic velocity 11 cm/sec CCA: 58 cm/sec SYSTOLIC ICA/CCA RATIO:  1.2 ECA: 63 cm/sec RIGHT CAROTID ARTERY: Minimal atherosclerotic plaque formation at the carotid bifurcation. No significant tortuosity. Normal low resistance waveforms. RIGHT VERTEBRAL ARTERY:  Antegrade flow. LEFT CAROTID ARTERY: Minimal atherosclerotic plaque formation at the carotid bifurcation. No significant tortuosity. Normal low resistance waveforms. LEFT VERTEBRAL ARTERY:  Antegrade flow. Upper extremity non-invasive blood pressures: Not obtained. IMPRESSION: 1. Right carotid artery system: Patent without significant atherosclerotic plaque formation. 2. Left carotid artery system: Patent without significant atherosclerotic plaque formation. 3.  Vertebral artery system: Patent with antegrade flow bilaterally. Ruthann Cancer, MD [facility] Electronically Signed   By: Ruthann Cancer MD   On: 04/23/2020 11:28   ECHO TEE  Result Date: 04/22/2020    TRANSESOPHOGEAL ECHO REPORT   Patient Name:   GRADY LUCCI Date of Exam: 04/22/2020 Medical Rec #:  [id number]     Height:       62.0 in Accession #:    [id number]    Weight:       145.0 lb Date of Birth:  27-Feb-1946      BSA:          1.667 m Patient Age:    63 years      BP:           144/129 mmHg Patient Gender: F             HR:           84 bpm. Exam Location:  [facility] Procedure: Transesophageal Echo, Cardiac Doppler and Color Doppler Indications:     Not listed on check-in form  History:         Patient has prior history of Echocardiogram examinations, most                  recent 04/06/2020. No medical history on file.  Sonographer:     Sherrie Sport RDCS (AE) Referring Phys:  [place] Diagnosing Phys:  Ida Rogue MD PROCEDURE: After discussion of the risks and benefits of a TEE, an informed consent was obtained from a family member. The transesophogeal probe was passed without difficulty through the esophogus of the patient. Local oropharyngeal anesthetic was provided with Cetacaine and viscous lidocaine. Sedation performed by performing physician. Patients was under conscious sedation during this procedure. Anesthetic  administered: 52mcg of Fentanyl, 2.0mg  of Versed. Image quality was excellent. The patient's vital signs; including heart rate, blood pressure, and oxygen saturation; remained stable throughout the procedure. The patient developed no complications during the procedure. IMPRESSIONS  1. No thrombus noted in the left atrium or left atrial appendage.  2. Left ventricular ejection fraction, by estimation, is 60 to 65%. The left ventricle has normal function. The left ventricle has no regional wall motion abnormalities. There is mild left ventricular hypertrophy.  3. Right ventricular systolic function is normal. The right ventricular size is normal.  4. The mitral valve is normal in structure. Mild mitral valve regurgitation.  5. There is Moderate (Grade III) atheroma plaque involving the transverse and descending aorta.  6. No valve vegetation Conclusion(s)/Recommendation(s): Normal biventricular function without evidence of hemodynamically significant valvular heart disease. FINDINGS  Left Ventricle: Left ventricular ejection fraction, by estimation, is 60 to 65%. The left ventricle has normal function. The left ventricle has no regional wall motion abnormalities. The left ventricular internal cavity size was normal in size. There is  mild left ventricular hypertrophy. Right Ventricle: The right ventricular size is normal. No increase in right ventricular wall thickness. Right ventricular systolic function is normal. Left Atrium: Left atrial size was normal in size. No left atrial/left atrial  appendage thrombus was detected. Right Atrium: Right atrial size was normal in size. Pericardium: There is no evidence of pericardial effusion. Mitral Valve: The mitral valve is normal in structure. Mild mitral valve regurgitation. No evidence of mitral valve stenosis. Tricuspid Valve: The tricuspid valve is normal in structure. Tricuspid valve regurgitation is mild . No evidence of tricuspid stenosis. Aortic Valve: The aortic valve is normal in structure. Aortic valve regurgitation is not visualized. No aortic stenosis is present. Pulmonic Valve: The pulmonic valve was normal in structure. Pulmonic valve regurgitation is not visualized. No evidence of pulmonic stenosis. Aorta: The aortic root is normal in size and structure. There is moderate (Grade III) atheroma plaque involving the transverse and descending aorta. Venous: The inferior vena cava is normal in size with greater than 50% respiratory variability, suggesting right atrial pressure of 3 mmHg. IAS/Shunts: No atrial level shunt detected by color flow Doppler. Agitated saline contrast was given intravenously to evaluate for intracardiac shunting. Ida Rogue MD Electronically signed by Ida Rogue MD Signature Date/Time: 04/22/2020/1:06:47 PM    Final     Medications:  I have reviewed the patient's current medications. Scheduled: . amLODipine  5 mg Oral Daily  . aspirin EC  81 mg Oral Daily  . Chlorhexidine Gluconate Cloth  6 each Topical Daily  . clopidogrel  75 mg Oral Daily  . enoxaparin (LOVENOX) injection  40 mg Subcutaneous Q24H    Assessment/Plan: 74 y.o. female with medical history significant forrecently diagnosed renal cell carcinoma metastatic to bone and lung on chemotherapy, on hydrocodone for bone pain who was sent in by her oncologist for further work-up of persistent somnolence in spite of discontinuation of hydrocodone.  MRI of the brain personally reviewed and shows evidence of small, acute infarcts bilaterally.   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  MRI of the lumbar spine was performed as well showing widespread metastatic disease and an early epidural tumor at L1.   Mental status abnormalities likely multifactorial in etiology and related to acute infarcts and metabolic abnormalities.   TEE pending.       - TEE pending - EEG no seizures activity - pt is much more awake today - dual anti platelet therapy post  TEE with ASA 81mg  and Plavix 75mg  x 3 weeks.

## 2020-04-24 ENCOUNTER — Encounter: Payer: Self-pay | Admitting: Cardiovascular Disease

## 2020-04-25 ENCOUNTER — Telehealth: Payer: Self-pay

## 2020-04-25 LAB — HEMOGLOBIN A1C
Hgb A1c MFr Bld: 5.1 % (ref 4.8–5.6)
Mean Plasma Glucose: 100 mg/dL

## 2020-04-25 NOTE — Progress Notes (Signed)
[facility]  [address], [address] [place], [place] [address] Phone: [phone]  Fax: [phone]   Clinic Day:  04/26/2020  Referring physician: Philmore Pali, NP  Chief Complaint: Mariah Thornton is a 74 y.o. female with metastatic clear cell renal cell carcinoma seen for reassessment after interval hospitalization and discussion regarding direction of therapy.  HPI:  The patient was last seen in the medical oncology clinic on 04/20/2020. At that time, she was groggy after taking hydrocodone/acetaminophen 5/325 every 6 hours. Blood pressure was high (206/166; repeat 181/74).  She had lower extremity edema.  Hematocrit was 30.5, hemoglobin 9.2, MCV 82.9, platelets 142,000, WBC 8,100. Sodium was 129 and potassium 5.5.  Calcium was 7.8 with an albumin of 2.0 (corrected calcium 9.5).  She was referred to the [facility] for evaluation of somnolence, hypertension, and electrolyte abnormalities (hyponatremia and hyperkalemia). She was admitted from 04/20/2020 - 04/23/2020. She received Lasix and IVF. 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 Neurology recommended baby aspirin with Plavix 75 mg daily x 3 weeks, then baby aspirin there after.  CXR revealed pulmonary metastatic disease re-demonstrated with new small pleural effusions since 02/2020.  Bilateral lower extremity duplex revealed no DVT.  Head CT without contrast revealed chronic atrophic and ischemic changes without acute abnormality.  There was a stable soft tissue scalp lesion near the vertex on the left.  A smaller nodule was noted in the midline anteriorly.  Brain MRI without contrast on 04/21/2020 revealed a subcentimeter foci of restricted diffusion within the right frontoparietal white matter and left parietal white matter (3 sites total). An additional subcentimeter focus of restricted diffusion was questioned within the posterior left frontal lobe white matter. These foci may reflect acute infarcts and  were suspicious for an embolic process. Given the history of recently diagnosed renal cell carcinoma, tiny metastases could not be excluded and follow-up contrast-enhanced MR imaging should be considered. There was mild generalized cerebral atrophy. There was advanced cerebral white matter chronic small vessel ischemic disease. There were chronic lacunar infarcts within the left basal ganglia and right thalamus. There was a redemonstrated 3.2 cm left parietal scalp mass as well as subcentimeter nodular lesion within the midline anterior scalp. Correlate with direct visualization and consider tissue sampling if not already performed.  Brain MRI with contrast on 04/21/2020 showed no evidence of cerebral metastatic disease. There was no enhancement associated with the small DWI lesions demonstrated earlier that day, which were therefore most c/w multiple small infarcts. The irregular 3.2 cm left vertex scalp soft mass was enhancing.  Lumbar spine MRI with and without contrast on 04/21/2020 revealed widespread osseous metastatic disease in the visible spine and pelvis. There was a mild pathologic compression fracture of L2 , new since 02/2020. There was an isolated early epidural tumor along the right posterior thecal sac at L1. There was no other lumbar epidural tumor. There was extraosseous extension of tumor into the right erector spinae muscle at L1. There was no malignant neural impingement at that time. There was superimposed lumbar spine degeneration.  EEG on 04/22/2020 was abnormal secondary to the presence of frequent periods of slowing that are more prominent frontally (FIRDA) and triphasic waves.  These findings may be seen with diffuse cerebral disturbance that is etiologically nonspecific, but may include a metabolic encephalopathy, among other possibilities.    She was seen by Altha Harm, NP from palliative care.  She was seen by Alexis Goodell of neurology. 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  Transesophageal echocardiogram on 04/22/2020 revealed an EF of 60-65%.  There was no thrombus in the left atrium or left atrial appendage.  There was no PFO.  There were no vegetations.  Carotid ultrasound on 04/23/2020 revealed no significant atherosclerotic plaque formation in the right and left carotid artery systems. There was antegrade flow bilaterally in the vertebral artery system.  During the interim, she has been "fine".  She still has some soreness in her groin. She has been eating and drinking more. She denies headaches, vision changes, numbness, and weakness. Her balance has been "on and off." Her leg swelling has improved. She found another lesion on her arm. Her back is stiff. She has not taken any Tramadol yet.  Her friend, Juliann Pulse, states that she is not completely back to normal, but she is almost there.  She states that the pain in her back is not that bad, but will let us know if she decides that she wants radiation.   History reviewed. No pertinent past medical history.  Past Surgical History:  Procedure Laterality Date  . PORTA CATH INSERTION N/A 03/28/2020   Procedure: PORTA CATH INSERTION;  Surgeon: Algernon Huxley, MD;  Location: [facility];  Service: Cardiovascular;  Laterality: N/A;  . TEE WITHOUT CARDIOVERSION N/A 04/22/2020   Procedure: TRANSESOPHAGEAL ECHOCARDIOGRAM (TEE);  Surgeon: Minna Merritts, MD;  Location: [facility];  Service: Cardiovascular;  Laterality: N/A;    History reviewed. No pertinent family history.  Social History:  reports that she has never smoked. She has never used smokeless tobacco. She reports current alcohol use of about 1.0 standard drink of alcohol per week. She reports previous drug use. reports that she has never smoked. She has never used smokeless tobacco. She reports previous alcohol use. She reports  previous drug use.  The patient denies any exposure to radiation or toxins.  She states that she is retired.  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  Her husband's name is [name].  The patient lives in [place], [place]. The patient is accompanied by her friend [name] (in person) today.  Allergies: No Known Allergies  Current Medications: Current Outpatient Medications  Medication Sig Dispense Refill  . amLODipine (NORVASC) 10 MG tablet Take 1 tablet (10 mg total) by mouth daily. 30 tablet 0  . aspirin EC 81 MG EC tablet Take 1 tablet (81 mg total) by mouth daily. Swallow whole. 30 tablet 2  . clopidogrel (PLAVIX) 75 MG tablet Take 1 tablet (75 mg total) by mouth daily for 21 days. 21 tablet 0  . folic acid (FOLVITE) 1 MG tablet Take 1 mg by mouth daily.    . traMADol (ULTRAM) 50 MG tablet Take 50 mg by mouth every 6 (six) hours as needed. Take 1/2 tab Q 6 hrs , prn    . acetaminophen (TYLENOL) 325 MG tablet Take 2 tablets (650 mg total) by mouth every 6 (six) hours as needed for mild pain (or Fever >/= 101). (Patient not taking: Reported on 04/26/2020)    . axitinib (INLYTA) 5 MG tablet Take 1 tablet (5 mg total) by mouth 2 (two) times daily. (Patient not taking: Reported on 04/21/2020) 60 tablet 0  . HYDROcodone-acetaminophen (NORCO/VICODIN) 5-325 MG tablet Take 1/2 or 1 pill every 6 hours as needed for pain. (Patient not taking: Reported on 04/21/2020) 30 tablet 0  . lidocaine-prilocaine (EMLA) cream Apply to  affected area once (Patient not taking: Reported on 04/04/2020) 30 g 3  . ondansetron (ZOFRAN) 8 MG tablet Take 1 tablet (8 mg total) by mouth 2 (two) times daily as needed (Nausea or vomiting). (Patient not taking: Reported on 04/04/2020) 30 tablet 1   No current facility-administered medications for this visit.   Review of Systems  Constitutional: Negative for chills, diaphoresis, fever, malaise/fatigue and weight loss (up 3 lbs).       Feels "fine."  HENT: Negative  for congestion, ear discharge, ear pain, hearing loss, nosebleeds, sinus pain, sore throat and tinnitus.   Eyes: Negative for blurred vision.  Respiratory: Negative for cough, hemoptysis, sputum production and shortness of breath.   Cardiovascular: Negative for chest pain, palpitations and leg swelling.  Gastrointestinal: Negative for abdominal pain, blood in stool, constipation, diarrhea, heartburn, melena, nausea and vomiting.       Eating and drinking more.  Genitourinary: Negative for dysuria, frequency, hematuria and urgency.  Musculoskeletal: Positive for back pain (stiffness). Negative for joint pain, myalgias and neck pain.       Groin pain.  Skin: Negative for itching and rash.       New lesion on arm.  Neurological: Negative for dizziness, tingling, sensory change, weakness and headaches.       Occasional poor balance.  Endo/Heme/Allergies: Does not bruise/bleed easily.  Psychiatric/Behavioral: Negative for depression and memory loss. The patient is not nervous/anxious and does not have insomnia.   All other systems reviewed and are negative.  Performance status (ECOG):  2  Vitals Blood pressure (!) 110/46, pulse 86, temperature (!) 97.4 F (36.3 C), temperature source Tympanic, resp. rate 18, height 5\' 2"  (1.575 m), weight 154 lb 9.6 oz (70.1 kg), SpO2 99 %.  Physical Exam Vitals and nursing note reviewed.  Constitutional:      Comments: Patient sitting in a wheelchair in no acute distress. She was examined in the wheelchair.  HENT:     Head: Normocephalic and atraumatic.     Comments: Three spots on scalp: 3.7 x 4.5 cm, 8 x 4 mm, and 9 x 6 mm    Mouth/Throat:     Mouth: Mucous membranes are moist.     Pharynx: Oropharynx is clear.  Eyes:     General: No scleral icterus.    Extraocular Movements: Extraocular movements intact.     Conjunctiva/sclera: Conjunctivae normal.     Pupils: Pupils are equal, round, and reactive to light.  Neck:     Comments: 7 cm x 4.5 cm  mass on base of posterior neck (stable size since last visit) Cardiovascular:     Rate and Rhythm: Normal rate and regular rhythm.     Heart sounds: Normal heart sounds. No murmur heard.   Pulmonary:     Effort: Pulmonary effort is normal. No respiratory distress.     Breath sounds: Normal breath sounds. No wheezing or rales.  Abdominal:     General: Bowel sounds are normal.     Palpations: There is no mass.     Tenderness: There is no abdominal tenderness.  Musculoskeletal:        General: No swelling. Normal range of motion.     Cervical back: Normal range of motion and neck supple.     Right lower leg: Edema (improved) present.     Left lower leg: Edema (improved) present.  Skin:    General: Skin is warm and dry.     Comments: 13 x 13 mm spot on left  shoulder.  Neurological:     Mental Status: She is alert and oriented to person, place, and time. Mental status is at baseline.  Psychiatric:        Behavior: Behavior normal.        Thought Content: Thought content normal.        Judgment: Judgment normal.      03/16/2020      04/04/2020      04/26/2020           Left shoulder         Appointment on 04/26/2020  Component Date Value Ref Range Status  . Sodium 04/26/2020 126* 135 - 145 mmol/L Final  . Potassium 04/26/2020 4.7  3.5 - 5.1 mmol/L Final  . Chloride 04/26/2020 99  98 - 111 mmol/L Final  . CO2 04/26/2020 18* 22 - 32 mmol/L Final  . Glucose, Bld 04/26/2020 153* 70 - 99 mg/dL Final   Glucose reference range applies only to samples taken after fasting for at least 8 hours.  . BUN 04/26/2020 17  8 - 23 mg/dL Final  . Creatinine, Ser 04/26/2020 0.93  0.44 - 1.00 mg/dL Final  . Calcium 04/26/2020 7.3* 8.9 - 10.3 mg/dL Final  . GFR, Estimated 04/26/2020 >60  >60 mL/min Final  . Anion gap 04/26/2020 9  5 - 15 Final   Performed at [facility], [address], [place], [place] [address]    Assessment:  Mariah Thornton is a 74 y.o. female with  metastatic renal cell carcinoma s/p CT guided biopsy of a 5.6 x 4.0 cm soft tissue mass in the posterior neck on 03/08/2020.  Pathology revealed metastatic carcinoma with clear cell and oncocytic features.  Cytokeratin 7 was negative, cytokeratin 20, focally positive, PAX 8 positive, GATA3 negative and p63 negative.  Based on the pattern of immunoreactivity, metastatic carcinoma is compatible with metastatic renal cell carcinoma.  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 She presented with hypercalcemia.   LDH was 245 on 03/05/2020.  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  There was a possible lytic lesion within the L2 vertebral body.  Abdomen and pelvis CT on 03/04/2020 revealed an 8.8 x 8.5 x 7.5 cm heterogeneous mass arising from the anterior right kidney.  There were numerous and large retroperitoneal lymph nodes.  There are multiple lytic osseous lesions involving the L2 and L3 vertebral bodies with clear cortical and medullary destruction.  There are multiple additional subtle osseous lucencies throughout the spine and pelvis were suspicious for subtle metastatic disease.  There is a small right and trace left pleural effusion and multiple bilateral pulmonary nodules.  There was stigmata of cirrhosis and splenomegaly with trace ascites.  Bone scan on 04/05/2020 revealed foci of abnormal tracer uptake at the inferior RIGHT scapula, L1 and L2 vertebral bodies, and distal LEFT femoral metaphysis c/w osseous metastases.  Left femur films on 04/07/2020 revealed no suspicious lytic or sclerotic osseous abnormality in the metaphysis of the distal left femur .  Lumbar spine MRI with and without contrast on 04/21/2020 revealed widespread osseous metastatic disease in the visible spine and pelvis. There was a mild pathologic compression fracture of  L2 , new since 02/2020. There was an isolated early epidural tumor along the right posterior thecal sac at L1. There was no other lumbar epidural tumor. There was extraosseous extension of tumor into the right erector  spinae muscle at L1. There was no malignant neural impingement at that time. There was superimposed lumbar spine degeneration.  She is day 20 of cycle #1 pembrolizumab (began 04/07/2020).  She began axitinib on 04/15/2020.  Axitinib discontinued on 04/20/2020.  She has a history of hypercalcemia.   She received Zometa on 03/04/2020 and 04/04/2020.  Echo on 04/06/2020 revealed an EF of 70-75%.  She has folate deficiency.  Folate was 4.8 on 03/06/2020.  She is on oral folic acid.  B12 was 2351 on 03/06/2020.  She was admitted to [facility] from 04/20/2020 - 04/23/2020 with somnolence, hypertension, and electrolyte abnormalities (hyponatremia and hyperkalemia). She received Lasix and IVF.  Head MRI with contrast on 04/21/2020 showed no evidence of cerebral metastatic disease. There was no enhancement associated with the small DWI lesions demonstrated earlier that day, which were therefore most c/w multiple small infarcts. The irregular 3.2 cm left vertex scalp soft mass was enhancing. EEG on 04/22/2020 was abnormal secondary to the presence of frequent periods of slowing that are more prominent frontally (FIRDA) and triphasic waves.  These findings may be seen with diffuse cerebral disturbance that is etiologically nonspecific, but may include a metabolic encephalopathy, among other possibilities.  She received the [place] COVID-19 vaccine on 11/11/2019 and 12/08/2019.  Symptomatically, she is near baseline.  She notes soreness in her groin and a stiff back.  Exam reveals smaller cutaneous lesions.   Plan: 1.Labs today: BMP, folate, T4. 2.  Metastatic clear cell renal cell carcinoma She has a large right renal mass, multiple lytic bone lesions, and a mass on her posterior neck.              She is in a poor prognostic group based on [facility] criteria (metastatic disease, KPS < 80%, hypercalcemia,elevated LDH,and anemia). She is 20 day of cycle #1 pembrolizumab. Axitinib is on hold.                         Cutaneous lesions are smaller.  Discuss plan to continue pembrolizumab and restart axitinib in the future at a decreased dose.  Re-review goal of therapy (palliative). 3. Hypercalcemia Calcium is 7.2 (corrected 8.9).             She has hypercalcemia associated with malignancy. Continue Zometa monthly (last 04/04/2020). 4. Normocytic anemia Hematocrit 31.6.  Hemoglobin 9.5.  MCV 83.6.             Etiology likely secondary to renal cell carcinoma. She denies any bleeding. Ferritin 660 with an iron saturation of 11% and a TIBC of 150. Folate was 4.8 (low) with a B12 of 2351.                         She is on folic acid.   Folate is 22.0 today.             Continue to monitor. 5. Cancer-related pain             She notes a stiff back and soreness in her groin.              Lumbar spine MRI on 04/21/2020 was personally reviewed.  Agree with radiology interpretation.   There was a mild pathologic compression fracture of L2 , new since 02/2020.    There was an isolated early epidural tumor along the right posterior thecal sac at L1.    There was extraosseous extension of tumor into the  right erector spinae muscle at L1.    There was no malignant neural impingement at that time.              She declines evaluation by radiation therapy at this time.  Pain is well managed with Tylenol.                         Family is hesitant about 1/2 Tramadol given her recent experience with hydrocodone with Tylenol.             Continue to monitor. 6.   Hypertension, resolved              BP 110/46 (back to normal) after discontinuation of axitinib.             Continue to monitor. 7.    Bilateral lower extremity edema             Discuss bilateral lower extremity duplex to r/o DVT.             Consider Lasix for diuresis. 8.  Hyponatremia             Work-up initiated with blood and urine.  Patient to drink fluids with electrolytes. 9.   Somnolence  Clinically, she is near baseline.  Hospitalization reviewed.  Etiology of somnolence felt multi-factorial (pain meds and CVA).  Consult Dr Manuella Ghazi in OPD. 10.   RTC on 04/28/2020 for labs (BMP) and cycle #2 pembrolizumab. 11.   RTC on 05/06/2020 for MD assessment, labs (BMP), and +/- Zometa.  I discussed the assessment and treatment plan with the patient.  The patient was provided an opportunity to ask questions and all were answered.  The patient agreed with the plan and demonstrated an understanding of the instructions.  The patient was advised to call back if the symptoms worsen or if the condition fails to improve as anticipated.  I provided 27 minutes of face-to-face time during this this encounter and > 50% was spent counseling as documented under my assessment and plan.  An additional 20 minutes were spent reviewing her chart (Epic and Care Everywhere) including notes, labs, and imaging studies.    Rocky Rishel C. Mike Gip, MD, PhD    04/26/2020, 10:31 AM  I, Mirian Mo Tufford, am acting as Education administrator for [employer]. Mike Gip, MD, PhD.  I, Juliani Laduke C. Mike Gip, MD, have reviewed the above documentation for accuracy and completeness, and I agree with the above.

## 2020-04-25 NOTE — Telephone Encounter (Signed)
-----   Message from Lequita Asal, MD sent at 04/24/2020  9:02 AM EDT ----- Regarding: Please call patient's husband  Confirm she is NOT taking axitinib (chemo pill) at this time.  Bring all medications when she comes to clinic on Tuesday.  M

## 2020-04-25 NOTE — Telephone Encounter (Signed)
Left a message to inform the patient if she can bring all medication to her visit tomorrow. To confirm she is not currently taking Axitinib ( chemo pills)

## 2020-04-26 ENCOUNTER — Inpatient Hospital Stay (HOSPITAL_BASED_OUTPATIENT_CLINIC_OR_DEPARTMENT_OTHER): Payer: Medicare Other | Admitting: Hematology and Oncology

## 2020-04-26 ENCOUNTER — Other Ambulatory Visit: Payer: Self-pay

## 2020-04-26 ENCOUNTER — Inpatient Hospital Stay: Payer: Medicare Other

## 2020-04-26 ENCOUNTER — Encounter: Payer: Self-pay | Admitting: Hematology and Oncology

## 2020-04-26 ENCOUNTER — Telehealth: Payer: Self-pay

## 2020-04-26 VITALS — BP 110/46 | HR 86 | Temp 97.4°F | Resp 18 | Ht 62.0 in | Wt 154.6 lb

## 2020-04-26 DIAGNOSIS — C641 Malignant neoplasm of right kidney, except renal pelvis: Secondary | ICD-10-CM

## 2020-04-26 DIAGNOSIS — G893 Neoplasm related pain (acute) (chronic): Secondary | ICD-10-CM

## 2020-04-26 DIAGNOSIS — D649 Anemia, unspecified: Secondary | ICD-10-CM | POA: Diagnosis not present

## 2020-04-26 DIAGNOSIS — E871 Hypo-osmolality and hyponatremia: Secondary | ICD-10-CM

## 2020-04-26 DIAGNOSIS — A4181 Sepsis due to Enterococcus: Secondary | ICD-10-CM | POA: Diagnosis not present

## 2020-04-26 DIAGNOSIS — R443 Hallucinations, unspecified: Secondary | ICD-10-CM | POA: Diagnosis not present

## 2020-04-26 DIAGNOSIS — I639 Cerebral infarction, unspecified: Secondary | ICD-10-CM

## 2020-04-26 DIAGNOSIS — R7989 Other specified abnormal findings of blood chemistry: Secondary | ICD-10-CM

## 2020-04-26 DIAGNOSIS — I6340 Cerebral infarction due to embolism of unspecified cerebral artery: Secondary | ICD-10-CM

## 2020-04-26 DIAGNOSIS — C7951 Secondary malignant neoplasm of bone: Secondary | ICD-10-CM

## 2020-04-26 DIAGNOSIS — Z7189 Other specified counseling: Secondary | ICD-10-CM

## 2020-04-26 DIAGNOSIS — E875 Hyperkalemia: Secondary | ICD-10-CM

## 2020-04-26 DIAGNOSIS — E538 Deficiency of other specified B group vitamins: Secondary | ICD-10-CM

## 2020-04-26 DIAGNOSIS — R600 Localized edema: Secondary | ICD-10-CM

## 2020-04-26 LAB — COMPREHENSIVE METABOLIC PANEL
ALT: 14 U/L (ref 0–44)
AST: 40 U/L (ref 15–41)
Albumin: 2 g/dL — ABNORMAL LOW (ref 3.5–5.0)
Alkaline Phosphatase: 91 U/L (ref 38–126)
Anion gap: 9 (ref 5–15)
BUN: 18 mg/dL (ref 8–23)
CO2: 19 mmol/L — ABNORMAL LOW (ref 22–32)
Calcium: 7.2 mg/dL — ABNORMAL LOW (ref 8.9–10.3)
Chloride: 98 mmol/L (ref 98–111)
Creatinine, Ser: 0.9 mg/dL (ref 0.44–1.00)
GFR, Estimated: >60 mL/min (ref >60)
Glucose, Bld: 98 mg/dL (ref 70–99)
Potassium: 4.6 mmol/L (ref 3.5–5.1)
Sodium: 126 mmol/L — ABNORMAL LOW (ref 135–145)
Total Bilirubin: 0.6 mg/dL (ref 0.3–1.2)
Total Protein: 6.6 g/dL (ref 6.5–8.1)

## 2020-04-26 LAB — CBC WITH DIFFERENTIAL/PLATELET
Abs Immature Granulocytes: 0.14 10*3/uL — ABNORMAL HIGH (ref 0.00–0.07)
Basophils Absolute: 0.1 10*3/uL (ref 0.0–0.1)
Basophils Relative: 1 %
Eosinophils Absolute: 0.3 10*3/uL (ref 0.0–0.5)
Eosinophils Relative: 3 %
HCT: 31.6 % — ABNORMAL LOW (ref 36.0–46.0)
Hemoglobin: 9.5 g/dL — ABNORMAL LOW (ref 12.0–15.0)
Immature Granulocytes: 1 %
Lymphocytes Relative: 10 %
Lymphs Abs: 1.1 10*3/uL (ref 0.7–4.0)
MCH: 25.1 pg — ABNORMAL LOW (ref 26.0–34.0)
MCHC: 30.1 g/dL (ref 30.0–36.0)
MCV: 83.6 fL (ref 80.0–100.0)
Monocytes Absolute: 1.2 10*3/uL — ABNORMAL HIGH (ref 0.1–1.0)
Monocytes Relative: 11 %
Neutro Abs: 8.4 10*3/uL — ABNORMAL HIGH (ref 1.7–7.7)
Neutrophils Relative %: 74 %
Platelets: 231 10*3/uL (ref 150–400)
RBC: 3.78 MIL/uL — ABNORMAL LOW (ref 3.87–5.11)
RDW: 20.3 % — ABNORMAL HIGH (ref 11.5–15.5)
WBC: 11.1 10*3/uL — ABNORMAL HIGH (ref 4.0–10.5)
nRBC: 0 % (ref 0.0–0.2)

## 2020-04-26 LAB — URIC ACID: Uric Acid, Serum: 8.7 mg/dL — ABNORMAL HIGH (ref 2.5–7.1)

## 2020-04-26 LAB — BASIC METABOLIC PANEL
Anion gap: 9 (ref 5–15)
BUN: 17 mg/dL (ref 8–23)
CO2: 18 mmol/L — ABNORMAL LOW (ref 22–32)
Calcium: 7.3 mg/dL — ABNORMAL LOW (ref 8.9–10.3)
Chloride: 99 mmol/L (ref 98–111)
Creatinine, Ser: 0.93 mg/dL (ref 0.44–1.00)
GFR, Estimated: >60 mL/min (ref >60)
Glucose, Bld: 153 mg/dL — ABNORMAL HIGH (ref 70–99)
Potassium: 4.7 mmol/L (ref 3.5–5.1)
Sodium: 126 mmol/L — ABNORMAL LOW (ref 135–145)

## 2020-04-26 LAB — T4, FREE: Free T4: 0.84 ng/dL (ref 0.61–1.12)

## 2020-04-26 LAB — OSMOLALITY: Osmolality: 278 mOsm/kg (ref 275–295)

## 2020-04-26 NOTE — Patient Instructions (Signed)
  No axitinib.  May try Tramadol 1/2 tablet once if pain not controlled with Tylenol.

## 2020-04-26 NOTE — Progress Notes (Signed)
The patient c/o pain noted to her groin area (6-7) today increase with movement

## 2020-04-26 NOTE — Telephone Encounter (Signed)
New patient referral faxed to Dr Manuella Ghazi office. Fax conformation confirmed.

## 2020-04-27 ENCOUNTER — Other Ambulatory Visit: Payer: Self-pay

## 2020-04-27 ENCOUNTER — Inpatient Hospital Stay
Admission: EM | Admit: 2020-04-27 | Discharge: 2020-05-03 | DRG: 871 | Disposition: A | Discharge status: Hospice | Payer: Medicare Other | Attending: Internal Medicine | Admitting: Internal Medicine

## 2020-04-27 ENCOUNTER — Emergency Department: Payer: Medicare Other

## 2020-04-27 ENCOUNTER — Inpatient Hospital Stay (HOSPITAL_BASED_OUTPATIENT_CLINIC_OR_DEPARTMENT_OTHER): Payer: Medicare Other | Admitting: Oncology

## 2020-04-27 ENCOUNTER — Inpatient Hospital Stay: Payer: Medicare Other

## 2020-04-27 ENCOUNTER — Other Ambulatory Visit: Payer: Self-pay | Admitting: Oncology

## 2020-04-27 ENCOUNTER — Other Ambulatory Visit: Payer: Self-pay | Admitting: *Deleted

## 2020-04-27 VITALS — BP 138/56 | HR 83 | Temp 97.1°F | Resp 20

## 2020-04-27 DIAGNOSIS — B952 Enterococcus as the cause of diseases classified elsewhere: Secondary | ICD-10-CM | POA: Diagnosis present

## 2020-04-27 DIAGNOSIS — R410 Disorientation, unspecified: Secondary | ICD-10-CM

## 2020-04-27 DIAGNOSIS — R443 Hallucinations, unspecified: Secondary | ICD-10-CM | POA: Diagnosis not present

## 2020-04-27 DIAGNOSIS — R4182 Altered mental status, unspecified: Secondary | ICD-10-CM

## 2020-04-27 DIAGNOSIS — C7951 Secondary malignant neoplasm of bone: Secondary | ICD-10-CM | POA: Diagnosis present

## 2020-04-27 DIAGNOSIS — I10 Essential (primary) hypertension: Secondary | ICD-10-CM | POA: Diagnosis present

## 2020-04-27 DIAGNOSIS — A419 Sepsis, unspecified organism: Secondary | ICD-10-CM | POA: Insufficient documentation

## 2020-04-27 DIAGNOSIS — D649 Anemia, unspecified: Secondary | ICD-10-CM

## 2020-04-27 DIAGNOSIS — I639 Cerebral infarction, unspecified: Secondary | ICD-10-CM

## 2020-04-27 DIAGNOSIS — Z20822 Contact with and (suspected) exposure to covid-19: Secondary | ICD-10-CM | POA: Diagnosis present

## 2020-04-27 DIAGNOSIS — C649 Malignant neoplasm of unspecified kidney, except renal pelvis: Secondary | ICD-10-CM | POA: Diagnosis present

## 2020-04-27 DIAGNOSIS — Z8673 Personal history of transient ischemic attack (TIA), and cerebral infarction without residual deficits: Secondary | ICD-10-CM

## 2020-04-27 DIAGNOSIS — C641 Malignant neoplasm of right kidney, except renal pelvis: Secondary | ICD-10-CM

## 2020-04-27 DIAGNOSIS — R6520 Severe sepsis without septic shock: Secondary | ICD-10-CM | POA: Diagnosis present

## 2020-04-27 DIAGNOSIS — E860 Dehydration: Secondary | ICD-10-CM

## 2020-04-27 DIAGNOSIS — E538 Deficiency of other specified B group vitamins: Secondary | ICD-10-CM | POA: Diagnosis present

## 2020-04-27 DIAGNOSIS — G9341 Metabolic encephalopathy: Secondary | ICD-10-CM | POA: Diagnosis present

## 2020-04-27 DIAGNOSIS — R441 Visual hallucinations: Secondary | ICD-10-CM | POA: Diagnosis present

## 2020-04-27 DIAGNOSIS — F05 Delirium due to known physiological condition: Secondary | ICD-10-CM | POA: Diagnosis present

## 2020-04-27 DIAGNOSIS — R54 Age-related physical debility: Secondary | ICD-10-CM | POA: Diagnosis present

## 2020-04-27 DIAGNOSIS — E871 Hypo-osmolality and hyponatremia: Secondary | ICD-10-CM

## 2020-04-27 DIAGNOSIS — N3090 Cystitis, unspecified without hematuria: Secondary | ICD-10-CM

## 2020-04-27 DIAGNOSIS — Z9181 History of falling: Secondary | ICD-10-CM

## 2020-04-27 DIAGNOSIS — R946 Abnormal results of thyroid function studies: Secondary | ICD-10-CM | POA: Diagnosis present

## 2020-04-27 DIAGNOSIS — Z7982 Long term (current) use of aspirin: Secondary | ICD-10-CM

## 2020-04-27 DIAGNOSIS — N390 Urinary tract infection, site not specified: Secondary | ICD-10-CM | POA: Diagnosis present

## 2020-04-27 DIAGNOSIS — E872 Acidosis: Secondary | ICD-10-CM | POA: Diagnosis present

## 2020-04-27 DIAGNOSIS — Z7902 Long term (current) use of antithrombotics/antiplatelets: Secondary | ICD-10-CM

## 2020-04-27 DIAGNOSIS — R5381 Other malaise: Secondary | ICD-10-CM | POA: Diagnosis present

## 2020-04-27 DIAGNOSIS — Z515 Encounter for palliative care: Secondary | ICD-10-CM

## 2020-04-27 DIAGNOSIS — Z66 Do not resuscitate: Secondary | ICD-10-CM | POA: Diagnosis not present

## 2020-04-27 DIAGNOSIS — Z79899 Other long term (current) drug therapy: Secondary | ICD-10-CM

## 2020-04-27 DIAGNOSIS — I959 Hypotension, unspecified: Secondary | ICD-10-CM | POA: Diagnosis present

## 2020-04-27 DIAGNOSIS — A4181 Sepsis due to Enterococcus: Principal | ICD-10-CM | POA: Diagnosis present

## 2020-04-27 DIAGNOSIS — D630 Anemia in neoplastic disease: Secondary | ICD-10-CM | POA: Diagnosis present

## 2020-04-27 DIAGNOSIS — E8809 Other disorders of plasma-protein metabolism, not elsewhere classified: Secondary | ICD-10-CM

## 2020-04-27 LAB — COMPREHENSIVE METABOLIC PANEL
ALT: 14 U/L (ref 0–44)
AST: 38 U/L (ref 15–41)
Albumin: 2.1 g/dL — ABNORMAL LOW (ref 3.5–5.0)
Alkaline Phosphatase: 88 U/L (ref 38–126)
Anion gap: 10 (ref 5–15)
BUN: 19 mg/dL (ref 8–23)
CO2: 17 mmol/L — ABNORMAL LOW (ref 22–32)
Calcium: 7.4 mg/dL — ABNORMAL LOW (ref 8.9–10.3)
Chloride: 101 mmol/L (ref 98–111)
Creatinine, Ser: 0.78 mg/dL (ref 0.44–1.00)
GFR, Estimated: >60 mL/min (ref >60)
Glucose, Bld: 90 mg/dL (ref 70–99)
Potassium: 4.8 mmol/L (ref 3.5–5.1)
Sodium: 128 mmol/L — ABNORMAL LOW (ref 135–145)
Total Bilirubin: 0.8 mg/dL (ref 0.3–1.2)
Total Protein: 6.5 g/dL (ref 6.5–8.1)

## 2020-04-27 LAB — URINE DRUG SCREEN, QUALITATIVE (ARMC ONLY)
Amphetamines, Ur Screen: NOT DETECTED
Barbiturates, Ur Screen: NOT DETECTED
Benzodiazepine, Ur Scrn: NOT DETECTED
Cannabinoid 50 Ng, Ur ~~LOC~~: NOT DETECTED
Cocaine Metabolite,Ur ~~LOC~~: NOT DETECTED
MDMA (Ecstasy)Ur Screen: NOT DETECTED
Methadone Scn, Ur: NOT DETECTED
Opiate, Ur Screen: NOT DETECTED
Phencyclidine (PCP) Ur S: NOT DETECTED
Tricyclic, Ur Screen: NOT DETECTED

## 2020-04-27 LAB — CBC WITH DIFFERENTIAL/PLATELET
Abs Immature Granulocytes: 0.13 10*3/uL — ABNORMAL HIGH (ref 0.00–0.07)
Basophils Absolute: 0 10*3/uL (ref 0.0–0.1)
Basophils Relative: 0 %
Eosinophils Absolute: 0.2 10*3/uL (ref 0.0–0.5)
Eosinophils Relative: 2 %
HCT: 29.5 % — ABNORMAL LOW (ref 36.0–46.0)
Hemoglobin: 9 g/dL — ABNORMAL LOW (ref 12.0–15.0)
Immature Granulocytes: 1 %
Lymphocytes Relative: 9 %
Lymphs Abs: 1.1 10*3/uL (ref 0.7–4.0)
MCH: 25.4 pg — ABNORMAL LOW (ref 26.0–34.0)
MCHC: 30.5 g/dL (ref 30.0–36.0)
MCV: 83.1 fL (ref 80.0–100.0)
Monocytes Absolute: 1.1 10*3/uL — ABNORMAL HIGH (ref 0.1–1.0)
Monocytes Relative: 9 %
Neutro Abs: 10 10*3/uL — ABNORMAL HIGH (ref 1.7–7.7)
Neutrophils Relative %: 79 %
Platelets: 249 10*3/uL (ref 150–400)
RBC: 3.55 MIL/uL — ABNORMAL LOW (ref 3.87–5.11)
RDW: 20 % — ABNORMAL HIGH (ref 11.5–15.5)
WBC: 12.7 10*3/uL — ABNORMAL HIGH (ref 4.0–10.5)
nRBC: 0 % (ref 0.0–0.2)

## 2020-04-27 LAB — RESPIRATORY PANEL BY RT PCR (FLU A&B, COVID)
Influenza A by PCR: NEGATIVE
Influenza B by PCR: NEGATIVE
SARS Coronavirus 2 by RT PCR: NEGATIVE

## 2020-04-27 LAB — FOLATE: Folate: 22 ng/mL (ref >5.9)

## 2020-04-27 LAB — URINALYSIS, COMPLETE (UACMP) WITH MICROSCOPIC
Bilirubin Urine: NEGATIVE
Glucose, UA: NEGATIVE mg/dL
Hgb urine dipstick: NEGATIVE
Ketones, ur: NEGATIVE mg/dL
Nitrite: NEGATIVE
Protein, ur: NEGATIVE mg/dL
Specific Gravity, Urine: 1.012 (ref 1.005–1.030)
pH: 5 (ref 5.0–8.0)

## 2020-04-27 LAB — CBC
HCT: 27.6 % — ABNORMAL LOW (ref 36.0–46.0)
Hemoglobin: 8.6 g/dL — ABNORMAL LOW (ref 12.0–15.0)
MCH: 25.5 pg — ABNORMAL LOW (ref 26.0–34.0)
MCHC: 31.2 g/dL (ref 30.0–36.0)
MCV: 81.9 fL (ref 80.0–100.0)
Platelets: 220 10*3/uL (ref 150–400)
RBC: 3.37 MIL/uL — ABNORMAL LOW (ref 3.87–5.11)
RDW: 20.1 % — ABNORMAL HIGH (ref 11.5–15.5)
WBC: 10.3 10*3/uL (ref 4.0–10.5)
nRBC: 0 % (ref 0.0–0.2)

## 2020-04-27 LAB — PROTIME-INR
INR: 1.4 — ABNORMAL HIGH (ref 0.8–1.2)
Prothrombin Time: 16.8 seconds — ABNORMAL HIGH (ref 11.4–15.2)

## 2020-04-27 LAB — BRAIN NATRIURETIC PEPTIDE: B Natriuretic Peptide: 76.6 pg/mL (ref 0.0–100.0)

## 2020-04-27 LAB — LACTIC ACID, PLASMA
Lactic Acid, Venous: 2.2 mmol/L (ref 0.5–1.9)
Lactic Acid, Venous: 2.4 mmol/L (ref 0.5–1.9)
Lactic Acid, Venous: 1.9 mmol/L (ref 0.5–1.9)

## 2020-04-27 LAB — APTT: aPTT: 45 seconds — ABNORMAL HIGH (ref 24–36)

## 2020-04-27 LAB — AMMONIA: Ammonia: 24 umol/L (ref 9–35)

## 2020-04-27 IMAGING — CR DG CHEST 2V
1 series · 2 of 2 positions shown · non-contrast
Comparison: [DATE], [DATE]

CLINICAL DATA: Altered mental status

EXAM:
CHEST - 2 VIEW

[Series 1: dg chest 2 view · 0.14mm/px · 2 of 2 slices shown]
[im 1/2]
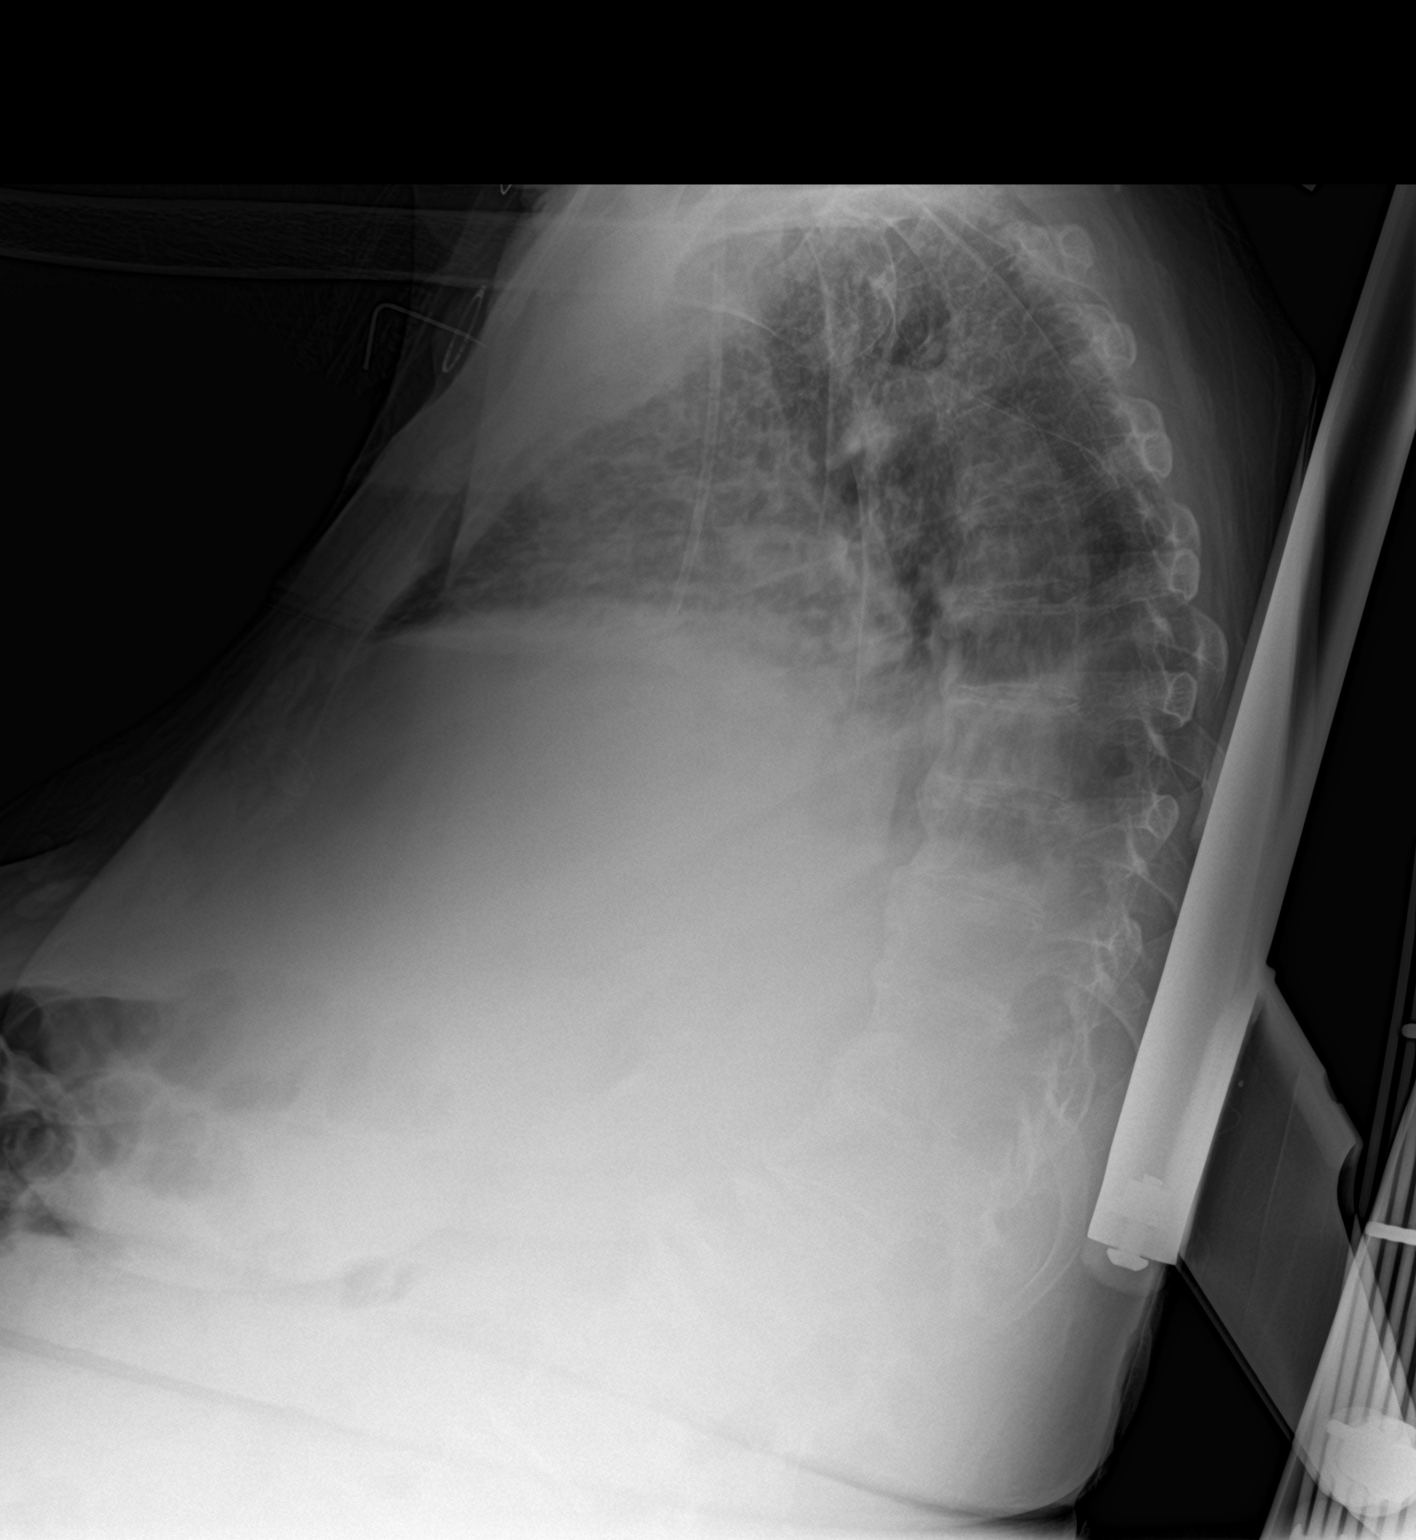
[im 2/2]
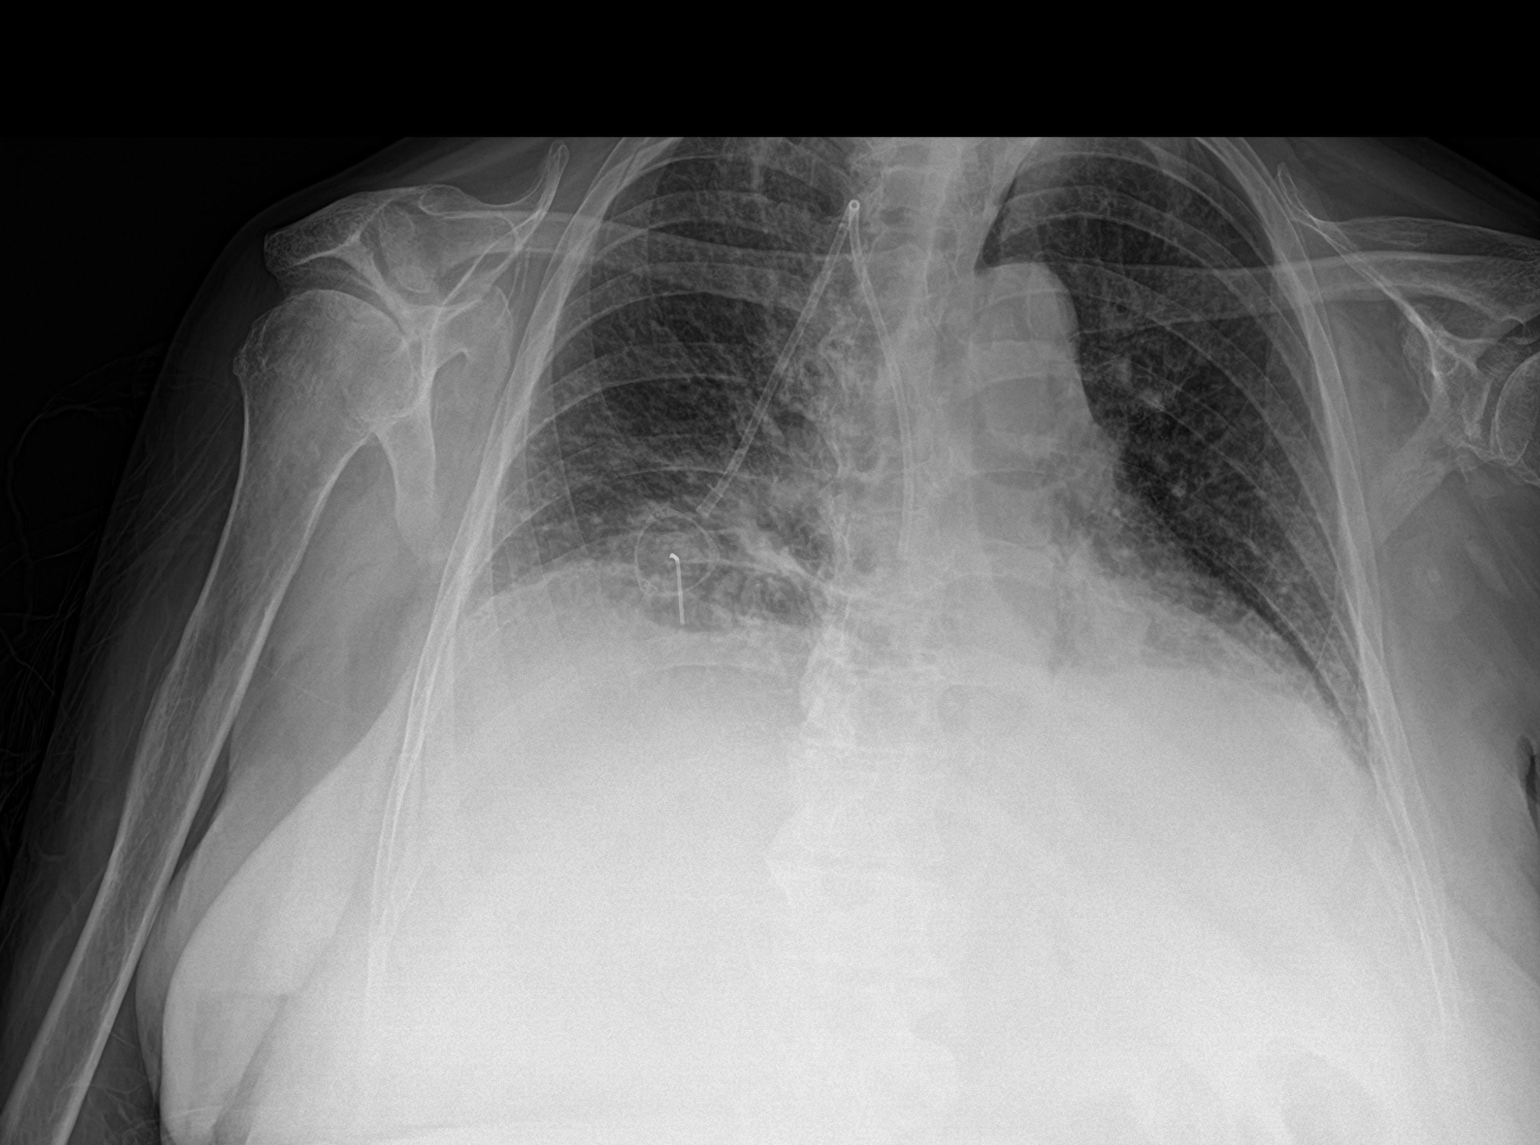

[2 of 2 positions shown; findings below may reference images not displayed]

FINDINGS: Right-sided Port-A-Cath remains in place. Stable heart size.
Atherosclerotic calcification of the aortic knob. Scattered nodular
densities most prevalent within the left lung base, better
characterized on recent CT. Small bilateral pleural effusions with
associated streaky bibasilar opacities, likely atelectasis. No
pneumothorax.
IMPRESSION: Small bilateral pleural effusions with associated streaky bibasilar
opacities, likely atelectasis.

## 2020-04-27 IMAGING — CT CT HEAD W/O CM
3 of 6 series · 16 of 47 positions shown, 19 images · non-contrast
Comparison: MRI [DATE], CT head [DATE].

CLINICAL DATA: Mental status change, unknown cause.

EXAM:
CT HEAD WITHOUT CONTRAST
TECHNIQUE: Contiguous axial images were obtained from the base of the skull
through the vertex without intravenous contrast.

[Series 2: head wo · axial · 0.41mm/px · z∈[-139,-19]mm · 11 of 29 slices shown, 14 images]
[im 3/29  brain]
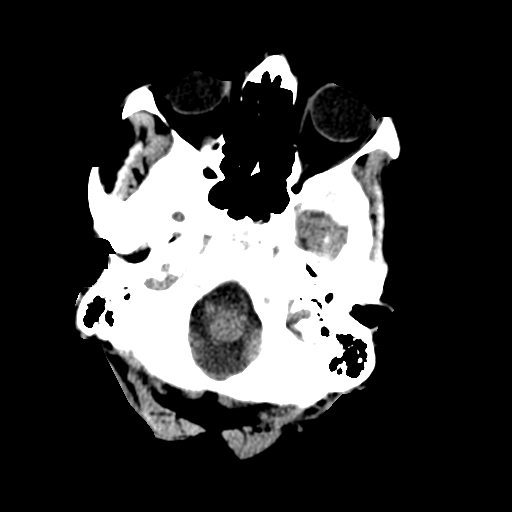
[im 3/29  bone]
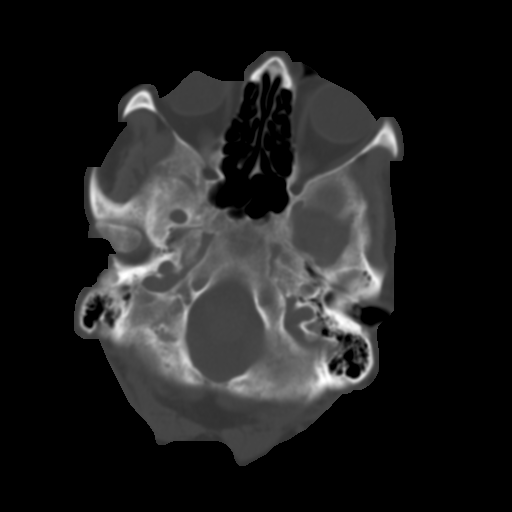
[im 5/29  brain]
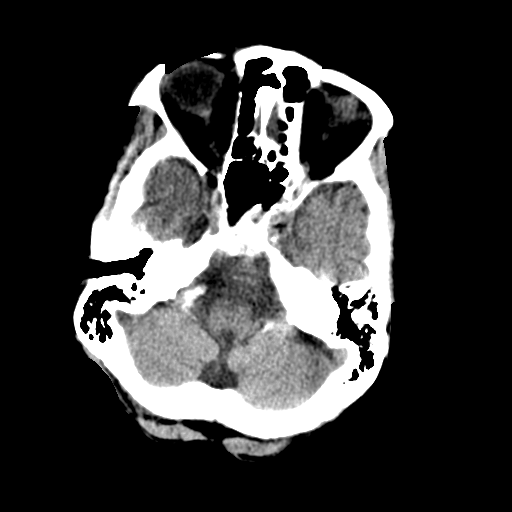
[im 7/29  brain]
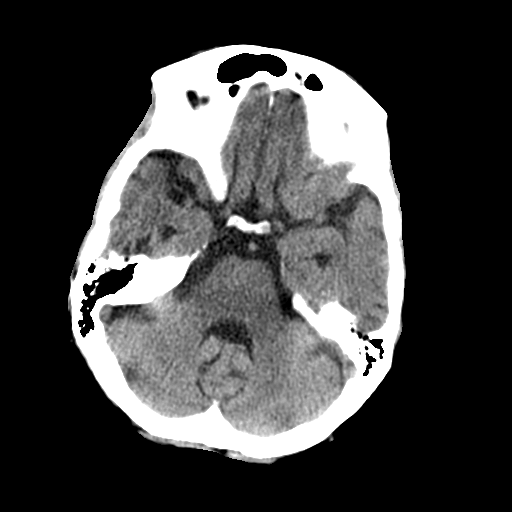
[im 11/29  brain]
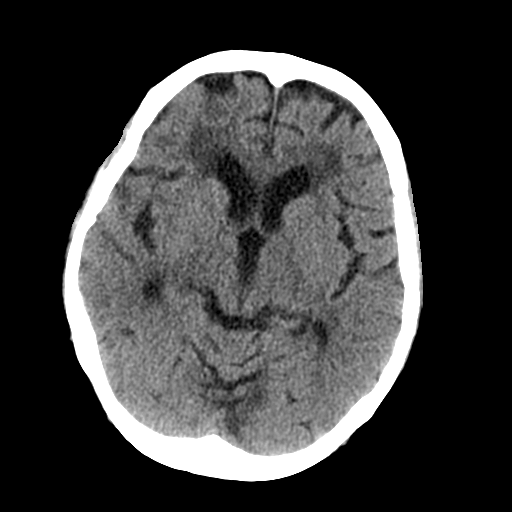
[im 13/29  brain]
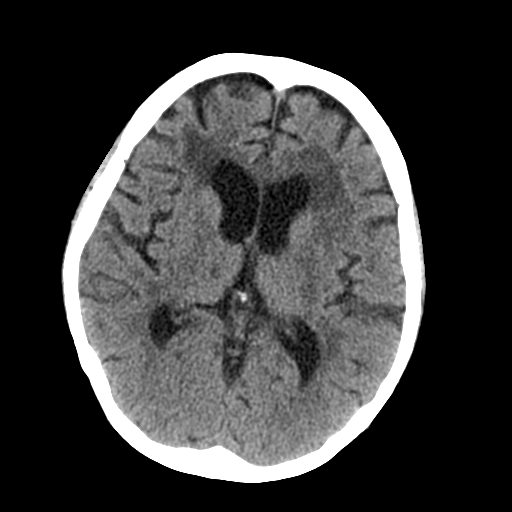
[im 13/29  bone]
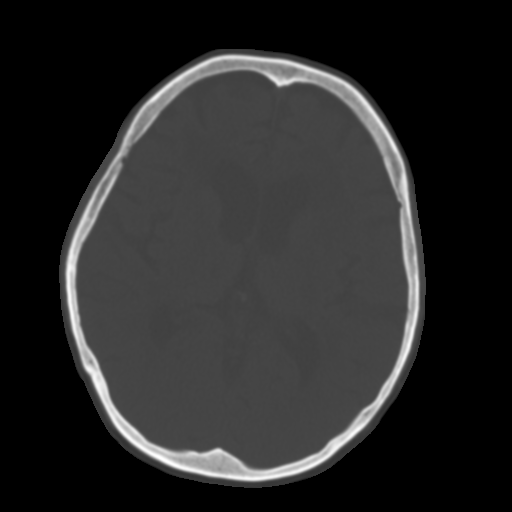
[im 15/29  brain]
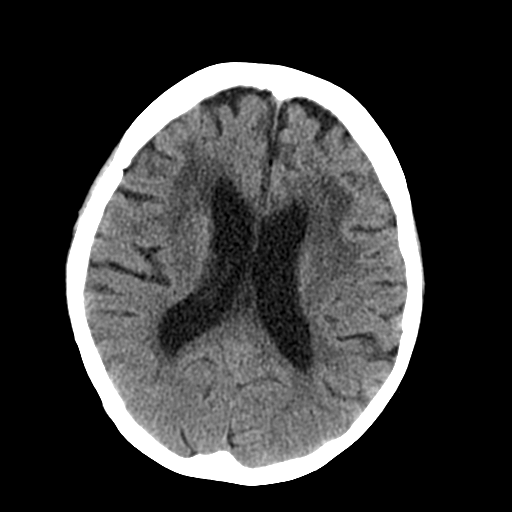
[im 17/29  brain]
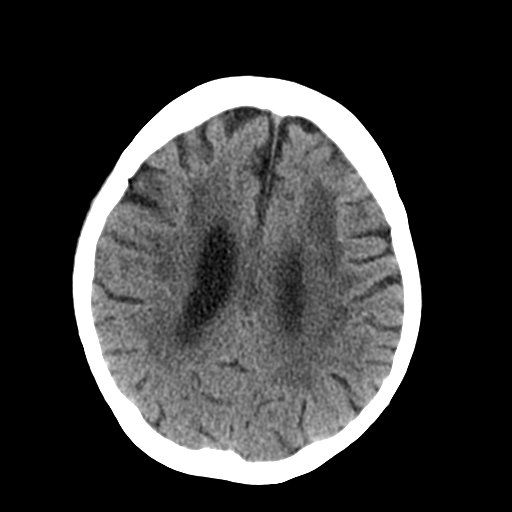
[im 19/29  brain]
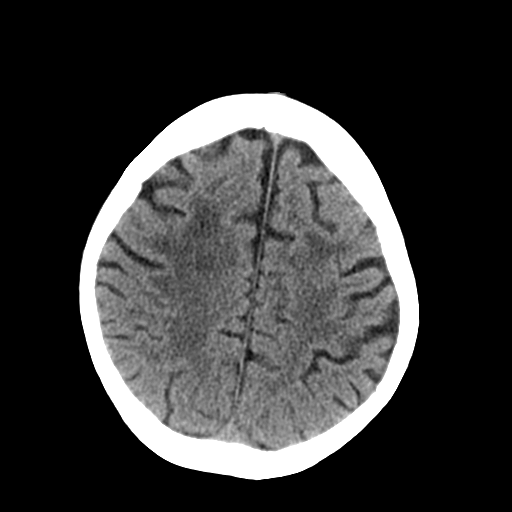
[im 23/29  brain]
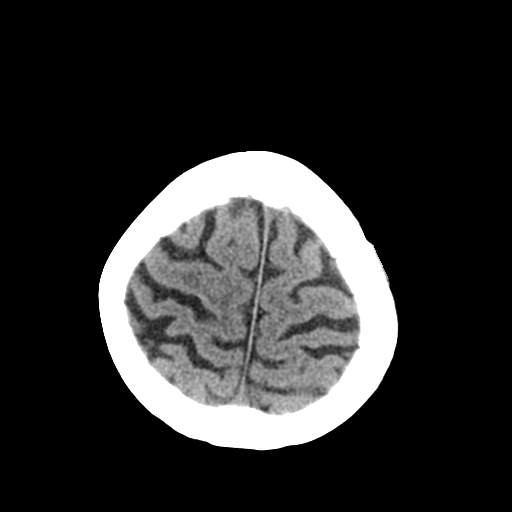
[im 23/29  bone]
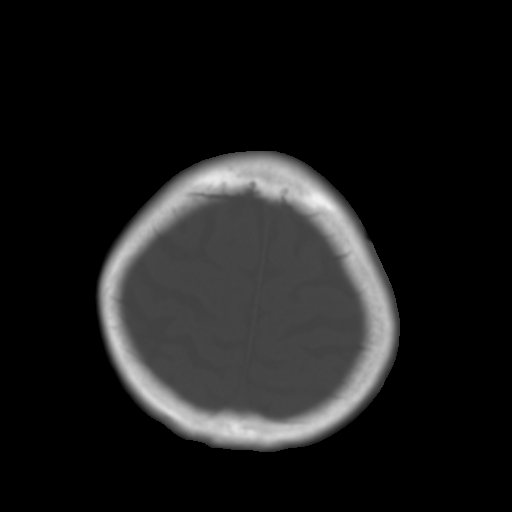
[im 25/29  brain]
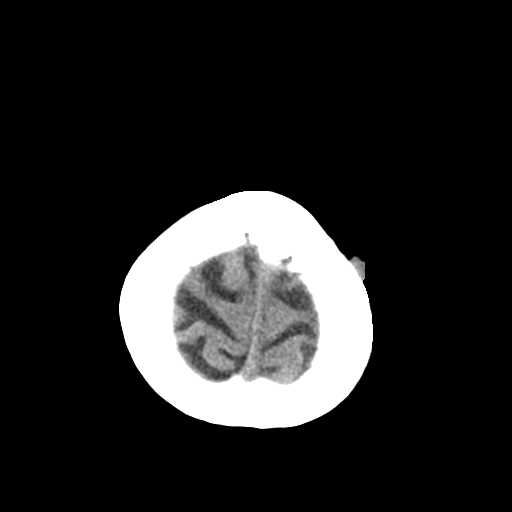
[im 27/29  brain]
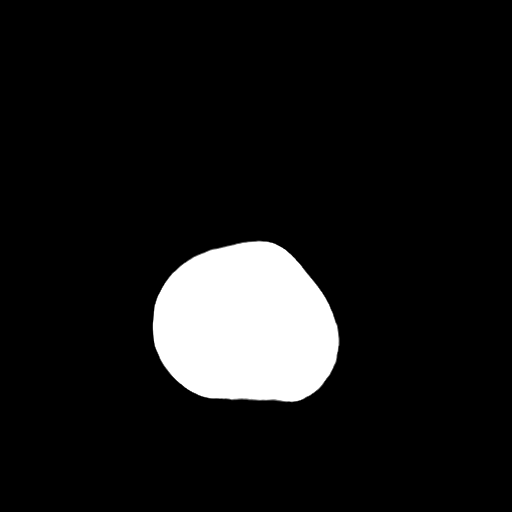

[Series 4: coronal soft tissue · coronal · 0.31mm/px · 3 of 59 slices shown]
[im 15/59  brain]
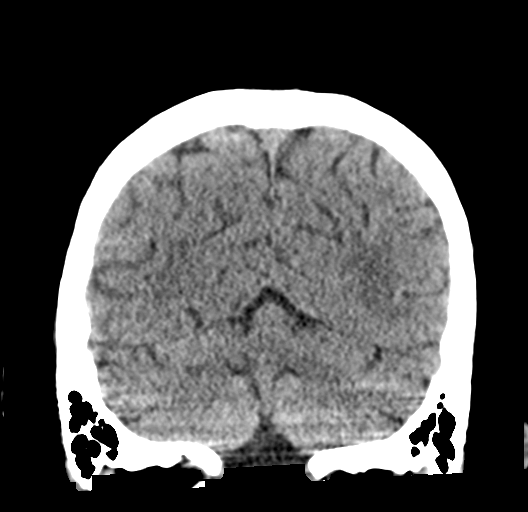
[im 30/59  brain]
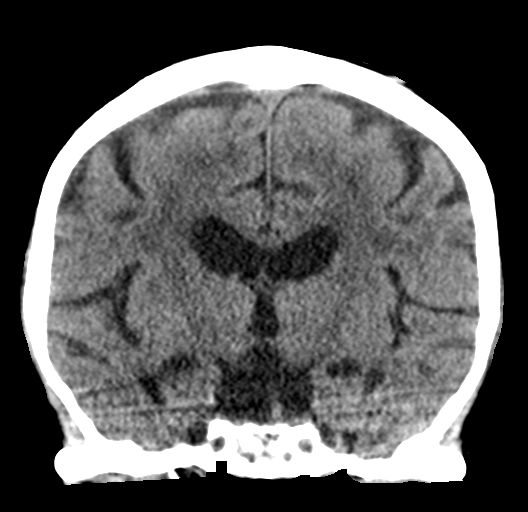
[im 44/59  brain]
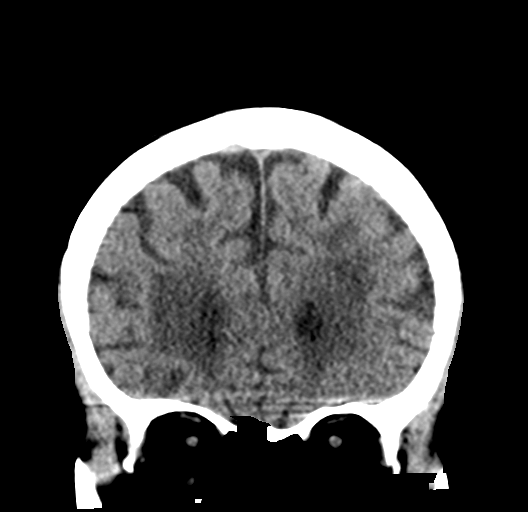

[Series 9: sagittal soft tissue · sagittal · 0.30mm/px · 2 of 51 slices shown]
[im 17/51  brain]
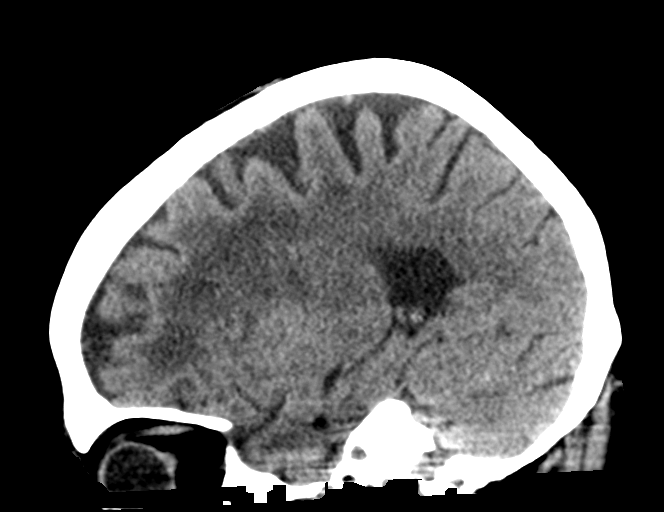
[im 34/51  brain]
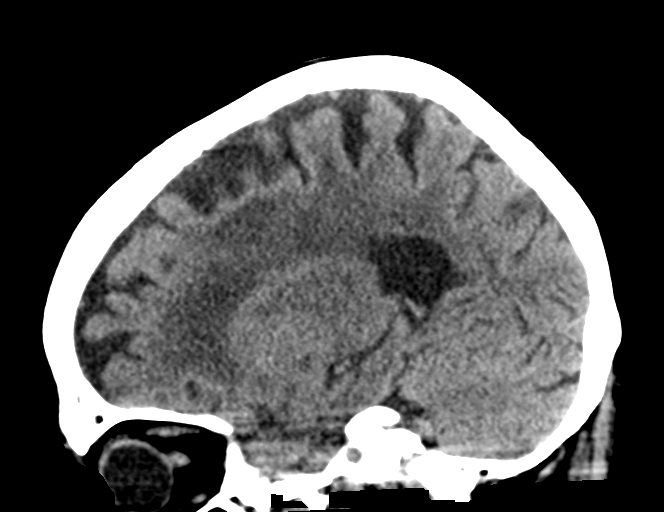

[16 of 47 positions shown; findings below may reference images not displayed]

FINDINGS: Brain: No evidence of acute large vascular territory infarction,
hemorrhage, hydrocephalus, extra-axial collection or mass
lesion/mass effect. Patchy white matter hypoattenuation appears
similar to prior and most likely represents chronic microvascular
ischemic disease.

Vascular: Calcific atherosclerosis.

Skull: Similar left frontal scalp lesion near the vertex and smaller
nodule at the midline anteriorly. No acute fracture.

Sinuses/Orbits: Clear sinuses.  Unremarkable orbits.

Other: No mastoid effusions.
IMPRESSION: 1. No evidence of acute intracranial abnormality.
2. aChronic microvascular ischemic disease and generalized atrophy.
3. Similar left frontal scalp lesion near the vertex and smaller
nodule at the midline anteriorly.

## 2020-04-27 MED ORDER — SODIUM CHLORIDE 0.9% FLUSH
10.0000 mL | INTRAVENOUS | Status: DC | PRN
  Administered 2020-04-27: 10 mL via INTRAVENOUS
  Filled 2020-04-27: qty 10

## 2020-04-27 MED ORDER — ENOXAPARIN SODIUM 40 MG/0.4ML ~~LOC~~ SOLN
40.0000 mg | SUBCUTANEOUS | Status: DC
  Administered 2020-04-27 – 2020-04-29 (×3): 40 mg via SUBCUTANEOUS
  Filled 2020-04-27 (×3): qty 0.4

## 2020-04-27 MED ORDER — SODIUM CHLORIDE 0.9 % IV SOLN
1.0000 g | INTRAVENOUS | Status: DC
  Administered 2020-04-28: 1 g via INTRAVENOUS
  Filled 2020-04-27 (×2): qty 10

## 2020-04-27 MED ORDER — ACETAMINOPHEN 650 MG RE SUPP: 650.0000 mg | Freq: Four times a day (QID) | RECTAL | Status: DC | PRN

## 2020-04-27 MED ORDER — ONDANSETRON HCL 4 MG/2ML IJ SOLN: 4.0000 mg | Freq: Four times a day (QID) | INTRAMUSCULAR | Status: DC | PRN

## 2020-04-27 MED ORDER — LACTATED RINGERS IV BOLUS
1000.0000 mL | Freq: Once | INTRAVENOUS | Status: AC
  Administered 2020-04-27: 1000 mL via INTRAVENOUS

## 2020-04-27 MED ORDER — SODIUM CHLORIDE 0.9 % IV SOLN: INTRAVENOUS | Status: DC

## 2020-04-27 MED ORDER — ACETAMINOPHEN 325 MG PO TABS
650.0000 mg | ORAL_TABLET | Freq: Four times a day (QID) | ORAL | Status: DC | PRN
  Administered 2020-05-01: 650 mg via ORAL
  Filled 2020-04-27 (×2): qty 2

## 2020-04-27 MED ORDER — SODIUM CHLORIDE 0.9 % IV SOLN
1.0000 g | Freq: Once | INTRAVENOUS | Status: AC
  Administered 2020-04-27: 1 g via INTRAVENOUS
  Filled 2020-04-27: qty 10

## 2020-04-27 MED ORDER — ONDANSETRON HCL 4 MG PO TABS: 4.0000 mg | ORAL_TABLET | Freq: Four times a day (QID) | ORAL | Status: DC | PRN

## 2020-04-27 MED ORDER — SODIUM CHLORIDE 0.9 % IV SOLN
Freq: Once | INTRAVENOUS | Status: AC
  Filled 2020-04-27: qty 250

## 2020-04-27 MED ORDER — HEPARIN SOD (PORK) LOCK FLUSH 100 UNIT/ML IV SOLN
500.0000 [IU] | Freq: Once | INTRAVENOUS | Status: DC
  Filled 2020-04-27: qty 5

## 2020-04-27 NOTE — ED Triage Notes (Signed)
Pt comes POV from cancer center with hallucinations, new AMS starting last night. Pt active cancer pt for renal carcinoma. Pt is having active hallucinations at this time but is able to follow basic commands and answer basic questions. Port accessed by cancer center.

## 2020-04-27 NOTE — Progress Notes (Signed)
Pt hallucinating. Reaching for things that are not there. Seeing "bodies" in the exam room. Pt received 1 liter of NS. Pt is weak unable to bear weight. Denies pain. Pt being sent to ED RN reported off to another RN in triage area. Port remains accessed in R chest area.

## 2020-04-27 NOTE — H&P (Signed)
History and Physical    Mariah Thornton MVH:[id number] DOB: Dec 10, 1945 DOA: 04/27/2020  PCP: Philmore Pali, NP   Patient coming from: home  I have personally briefly reviewed patient's old medical records in [place]  Chief Complaint: Hallucinations  HPI: Mariah Thornton is a 74 y.o. female with medical history significant for stage IV renal cell carcinoma (followed by Dr. Mike Gip) s/p 1 cycle of Keytruda on 04/07/2020 and onoral Inlyta  as well as history of HTN, CVA, chronic anemia, recently hospitalized from 10/13-10/16 with excessive somnolence, of multifactorial etiology including small infarcts seen on MRI brain, pain medications and dehydration from poor oral intake who was sent by the oncology clinic for admission due to complaints of hallucinations consisting of seeing animals around her.  Patient denies headache, visual disturbance or one-sided weakness numbness or tingling.  Caregiver neighbor at bedside contributes to history ED Course: On arrival, vitals were within normal limits with somewhat soft blood pressure of 117/51.  Blood work mostly notable for hemoglobin of 8.6, which is about her baseline, normal WBC of 10.3 but with elevated lactic acid of 2.2, sodium 128.  Urinalysis showed moderate leukocyte esterase. EKG as reviewed by me : Normal sinus rhythm with no acute ST-T wave changes Patient started on IV fluids, Rocephin.  Hospitalist consulted for admission for possible sepsis secondary to UTI   Review of Systems: Unable to obtain due to active hallucinations   History reviewed. No pertinent past medical history.  Past Surgical History:  Procedure Laterality Date  . PORTA CATH INSERTION N/A 03/28/2020   Procedure: PORTA CATH INSERTION;  Surgeon: Algernon Huxley, MD;  Location: [facility];  Service: Cardiovascular;  Laterality: N/A;  . TEE WITHOUT CARDIOVERSION N/A 04/22/2020   Procedure: TRANSESOPHAGEAL ECHOCARDIOGRAM (TEE);  Surgeon: Minna Merritts, MD;   Location: [facility];  Service: Cardiovascular;  Laterality: N/A;     reports that she has never smoked. She has never used smokeless tobacco. She reports current alcohol use of about 1.0 standard drink of alcohol per week. She reports previous drug use.  No Known Allergies  History reviewed. No pertinent family history.    Prior to Admission medications   Medication Sig Start Date End Date Taking? Authorizing Provider  amLODipine (NORVASC) 10 MG tablet Take 1 tablet (10 mg total) by mouth daily. 04/24/20 05/24/20 Yes Nolberto Hanlon, MD  aspirin EC 81 MG EC tablet Take 1 tablet (81 mg total) by mouth daily. Swallow whole. 04/24/20  Yes Nolberto Hanlon, MD  clopidogrel (PLAVIX) 75 MG tablet Take 1 tablet (75 mg total) by mouth daily for 21 days. 04/24/20 05/15/20 Yes Nolberto Hanlon, MD  folic acid (FOLVITE) 1 MG tablet Take 1 mg by mouth daily.   Yes [provider]  acetaminophen (TYLENOL) 325 MG tablet Take 2 tablets (650 mg total) by mouth every 6 (six) hours as needed for mild pain (or Fever >/= 101). Patient not taking: Reported on 04/26/2020 04/23/20   Nolberto Hanlon, MD  axitinib (INLYTA) 5 MG tablet Take 1 tablet (5 mg total) by mouth 2 (two) times daily. Patient not taking: Reported on 04/21/2020 03/22/20   Lequita Asal, MD  HYDROcodone-acetaminophen (NORCO/VICODIN) 5-325 MG tablet Take 1/2 or 1 pill every 6 hours as needed for pain. Patient not taking: Reported on 04/21/2020 04/14/20   Lequita Asal, MD  lidocaine-prilocaine (EMLA) cream Apply to affected area once Patient taking differently: Apply 1 application topically daily as needed. Apply to affected area  once 04/01/20   Lequita Asal, MD  ondansetron (ZOFRAN) 8 MG tablet Take 1 tablet (8 mg total) by mouth 2 (two) times daily as needed (Nausea or vomiting). Patient not taking: Reported on 04/04/2020 04/01/20   Lequita Asal, MD    Physical Exam: Vitals:   04/27/20 1755 04/27/20 1820 04/27/20 1900  04/27/20 2000  BP: 137/60 105/65 (!) 125/47 125/68  Pulse: 84 87 86 89  Resp: (!) 22 (!) 23 (!) 23 17  Temp:      TempSrc:      SpO2: 96% 96% 93% 94%     Vitals:   04/27/20 1755 04/27/20 1820 04/27/20 1900 04/27/20 2000  BP: 137/60 105/65 (!) 125/47 125/68  Pulse: 84 87 86 89  Resp: (!) 22 (!) 23 (!) 23 17  Temp:      TempSrc:      SpO2: 96% 96% 93% 94%      Constitutional: Alert and oriented x 2 . Not in any apparent distress HEENT:      Head: Normocephalic and atraumatic.         Eyes: PERLA, EOMI, Conjunctivae are normal. Sclera is non-icteric.       Mouth/Throat: Mucous membranes are moist.       Neck: Supple with no signs of meningismus. Cardiovascular: Regular rate and rhythm. No murmurs, gallops, or rubs. 2+ symmetrical distal pulses are present . No JVD. No LE edema Respiratory: Respiratory effort normal .Lungs sounds clear bilaterally. No wheezes, crackles, or rhonchi.  Gastrointestinal: Soft, non tender, and non distended with positive bowel sounds. No rebound or guarding. Genitourinary: No CVA tenderness. Musculoskeletal: Nontender with normal range of motion in all extremities. No cyanosis, or erythema of extremities. Neurologic:  Face is symmetric. Moving all extremities. No gross focal neurologic deficits . Skin: Skin is warm, dry.  No rash or ulcers Psychiatric: Actively hallucinating.  Patient very talkative   Labs on Admission: I have personally reviewed following labs and imaging studies  CBC: Recent Labs  Lab 04/26/20 1115 04/27/20 0923 04/27/20 1315  WBC 11.1* 12.7* 10.3  NEUTROABS 8.4* 10.0*  --   HGB 9.5* 9.0* 8.6*  HCT 31.6* 29.5* 27.6*  MCV 83.6 83.1 81.9  PLT 231 249 088   Basic Metabolic Panel: Recent Labs  Lab 04/22/20 1808 04/23/20 1027 04/26/20 0914 04/26/20 1115 04/27/20 1315  NA 132* 134* 126* 126* 128*  K 5.2* 4.3 4.7 4.6 4.8  CL 105 105 99 98 101  CO2 19* 20* 18* 19* 17*  GLUCOSE 79 101* 153* 98 90  BUN 22 19 17 18 19     CREATININE 0.83 0.86 0.93 0.90 0.78  CALCIUM 7.5* 7.7* 7.3* 7.2* 7.4*   GFR: Estimated Creatinine Clearance: 55.7 mL/min (by C-G formula based on SCr of 0.78 mg/dL). Liver Function Tests: Recent Labs  Lab 04/26/20 1115 04/27/20 1315  AST 40 38  ALT 14 14  ALKPHOS 91 88  BILITOT 0.6 0.8  PROT 6.6 6.5  ALBUMIN 2.0* 2.1*   No results for input(s): LIPASE, AMYLASE in the last 168 hours. Recent Labs  Lab 04/21/20 0300 04/27/20 1329  AMMONIA 16 24   Coagulation Profile: Recent Labs  Lab 04/27/20 1315  INR 1.4*   Cardiac Enzymes: No results for input(s): CKTOTAL, CKMB, CKMBINDEX, TROPONINI in the last 168 hours. BNP (last 3 results) No results for input(s): PROBNP in the last 8760 hours. HbA1C: No results for input(s): HGBA1C in the last 72 hours. CBG: No results for input(s): GLUCAP in  the last 168 hours. Lipid Profile: No results for input(s): CHOL, HDL, LDLCALC, TRIG, CHOLHDL, LDLDIRECT in the last 72 hours. Thyroid Function Tests: Recent Labs    04/26/20 0914  FREET4 0.84   Anemia Panel: Recent Labs    04/26/20 0914  FOLATE 22.0   Urine analysis:    Component Value Date/Time   COLORURINE YELLOW (A) 04/27/2020 0923   APPEARANCEUR HAZY (A) 04/27/2020 0923   LABSPEC 1.012 04/27/2020 0923   PHURINE 5.0 04/27/2020 0923   GLUCOSEU NEGATIVE 04/27/2020 0923   HGBUR NEGATIVE 04/27/2020 0923   BILIRUBINUR NEGATIVE 04/27/2020 0923   KETONESUR NEGATIVE 04/27/2020 0923   PROTEINUR NEGATIVE 04/27/2020 0923   NITRITE NEGATIVE 04/27/2020 0923   LEUKOCYTESUR MODERATE (A) 04/27/2020 0923    Radiological Exams on Admission: DG Chest 2 View  Result Date: 04/27/2020 CLINICAL DATA:  Altered mental status EXAM: CHEST - 2 VIEW COMPARISON:  04/20/2020, 03/03/2020 FINDINGS: Right-sided Port-A-Cath remains in place. Stable heart size. Atherosclerotic calcification of the aortic knob. Scattered nodular densities most prevalent within the left lung base, better characterized  on recent CT. Small bilateral pleural effusions with associated streaky bibasilar opacities, likely atelectasis. No pneumothorax. IMPRESSION: Small bilateral pleural effusions with associated streaky bibasilar opacities, likely atelectasis. Electronically Signed   By: Davina Poke D.O.   On: 04/27/2020 16:49   CT Head Wo Contrast  Result Date: 04/27/2020 CLINICAL DATA:  Mental status change, unknown cause. EXAM: CT HEAD WITHOUT CONTRAST TECHNIQUE: Contiguous axial images were obtained from the base of the skull through the vertex without intravenous contrast. COMPARISON:  MRI 04/21/2020, CT head 04/20/2020. FINDINGS: Brain: No evidence of acute large vascular territory infarction, hemorrhage, hydrocephalus, extra-axial collection or mass lesion/mass effect. Patchy white matter hypoattenuation appears similar to prior and most likely represents chronic microvascular ischemic disease. Vascular: Calcific atherosclerosis. Skull: Similar left frontal scalp lesion near the vertex and smaller nodule at the midline anteriorly. No acute fracture. Sinuses/Orbits: Clear sinuses.  Unremarkable orbits. Other: No mastoid effusions. IMPRESSION: 1. No evidence of acute intracranial abnormality. 2. 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 3. Similar left frontal scalp lesion near the vertex and smaller nodule at the midline anteriorly. Electronically Signed   By: Margaretha Sheffield MD   On: 04/27/2020 13:59    Assessment/Plan 74 year old female with history of stage IV renal cell carcinoma (followed by Dr. Mike Gip) s/p 1 cycle of Keytruda on 04/07/2020 and onoral Inlyta  as well as history of HTN, CVA, chronic anemia, recently hospitalized from 10/13-10/16 with excessive somnolence, of multifactorial etiology including small infarcts seen on MRI brain, pain medications and dehydration from poor oral intake who was sent by the oncology clinic for admission due to complaints of hallucinations      Hallucinations, suspect acute metabolic encephalopathy -Uncertain etiology -Possibly related to UTI with possible sepsis -Head CT showing frontal scalp nodule x2, but no acute finding and brain parenchyma -Neurologic checks with fall and aspiration precautions -Patient may need placement  Possible sepsis ([place])   UTI (urinary tract infection) -Soft criteria but urinalysis mildly abnormal, WBC normal patient afebrile but lactic acid 2.2 but responding to IV fluids -Continue IV fluids -Continue Rocephin and follow urine cultures  Recent embolic CVA -Continue antiplatelet and statins    Metastatic renal cell carcinoma ([place]) to bone and lung -Consider oncology consult in the a.m. -Hydrocodone as needed for pain    DVT prophylaxis: Lovenox  Code Status: full code  Family Communication:  none  Disposition Plan: May need to go  to the SNF for assisted living., Unable to care for self -Caregiver at bedside states that they look in on her but it is becoming increasingly difficult for them to care for patient who lives with her elderly husband who is hearing impaired Consults called: none  Status: Observation .  Caregiver    Athena Masse MD [facility]     04/27/2020, 8:52 PM

## 2020-04-27 NOTE — Progress Notes (Signed)
Symptom Management Consult note [facility]  [phone] [phone]  Patient Care Team: Philmore Pali, NP as PCP - General (Nurse Practitioner)   Name of the patient: Mariah Thornton  [id number]  1945/12/30   Date of visit: 04/27/2020   Diagnosis-kidney cancer  Chief complaint/ Reason for visit-confusion/hallucination  Heme/Onc history:  Oncology History  Renal cell carcinoma of right kidney ([place])  03/16/2020 Initial Diagnosis   Renal cell carcinoma of right kidney ([place])   04/07/2020 -  Chemotherapy   The patient had pembrolizumab (KEYTRUDA) 200 mg in sodium chloride 0.9 % 50 mL chemo infusion, 200 mg, Intravenous, Once, 1 of 6 cycles Administration: 200 mg (04/07/2020)  for chemotherapy treatment.     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  She is followed by Dr. Mike Gip.  She is status post 1 cycle of Keytruda on 04/07/2020 and on oral Inlyta.  She was evaluated by Dr. Mike Gip after her first cycle on 04/14/2020 and 04/20/2020 and appeared to have tolerated first treatment well. She continued Inlyta.   She was admitted to the hospital on 04/21/2020 for altered mental status and somnolence thought to be secondary to pain medication and treatment.  Altered mental status persisted after stopping tramadol.  She was sent to the emergency room for further evaluation.  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  Patient also had an MRI of the lumbar spine revealing widespread metastatic disease and epidural tumor at L1.  Diagnosis at discharge was metabolic encephalopathy secondary to poor oral intake. She was started on Plavix for 3 weeks.  She was offered to go to an assisted living facility but husband declined.  She  was taken home.  Her Inlyta was discontinued.   She had follow-up with Dr. Mike Gip yesterday where she had lab work showing a sodium level of 126 and a calcium level of 7.3.  She was asked to hold her Bartholomew Boards on return to clinic on Thursday for Keytruda.  Today, she presents with her family friend Mariah Thornton for concerns of confusion and delirium that started yesterday evening.  Mariah Thornton states patient has not slept at all.  She was up all night hollering out and attempting to get up and down out of her chair. She is seeing people and objects and attempting to touch them.  Patient has acute back pain with movement but otherwise is comfortable.  Mariah Thornton states she has eaten very little over the past 2 days.  It was very hard to get her to drink any fluids last night.  She is unsteady on her feet and they are afraid that she is going to fall.  The husband refuses to be left alone with her d/t fear of her falling. They deny any fever, cough or respiratory concerns.  ECOG FS:3 - Symptomatic, >50% confined to bed  Review of systems- Review of Systems  Constitutional: Negative.  Negative for chills, fever, malaise/fatigue and weight loss.  HENT: Negative for congestion, ear pain and tinnitus.   Eyes: Negative.  Negative for blurred vision and double vision.  Respiratory: Negative.  Negative for cough, sputum production and shortness of breath.   Cardiovascular: Positive for leg swelling. Negative for chest pain and palpitations.  Gastrointestinal: Negative.  Negative for abdominal pain, constipation, diarrhea, nausea and vomiting.  Genitourinary: Negative for  dysuria, frequency and urgency.  Musculoskeletal: Negative for back pain and falls.  Skin: Negative.  Negative for rash.  Neurological: Positive for weakness. Negative for headaches.  Endo/Heme/Allergies: Negative.  Does not bruise/bleed easily.  Psychiatric/Behavioral: Positive for hallucinations. Negative for depression. The patient has insomnia. The  patient is not nervous/anxious.      Current treatment- s/p 1 cycle of keytruda  No Known Allergies   No past medical history on file.   Past Surgical History:  Procedure Laterality Date   PORTA CATH INSERTION N/A 03/28/2020   Procedure: PORTA CATH INSERTION;  Surgeon: Algernon Huxley, MD;  Location: [facility];  Service: Cardiovascular;  Laterality: N/A;   TEE WITHOUT CARDIOVERSION N/A 04/22/2020   Procedure: TRANSESOPHAGEAL ECHOCARDIOGRAM (TEE);  Surgeon: Minna Merritts, MD;  Location: [facility];  Service: Cardiovascular;  Laterality: N/A;    Social History   Socioeconomic History   Marital status: Married    Spouse name: Not on file   Number of children: Not on file   Years of education: Not on file   Highest education level: Not on file  Occupational History   Not on file  Tobacco Use   Smoking status: Never Smoker   Smokeless tobacco: Never Used  Vaping Use   Vaping Use: Never used  Substance and Sexual Activity   Alcohol use: Yes    Alcohol/week: 1.0 standard drink    Types: 1 Cans of beer per week    Comment: occasional   Drug use: Not Currently   Sexual activity: Not on file  Other Topics Concern   Not on file  Social History Narrative   Lives at home with husband   Social Determinants of Health   Financial Resource Strain:    Difficulty of Paying Living Expenses: Not on file  Food Insecurity:    Worried About Charity fundraiser in the Last Year: Not on file   [employer] of Food in the Last Year: Not on file  Transportation Needs:    Lack of Transportation (Medical): Not on file   Lack of Transportation (Non-Medical): Not on file  Physical Activity:    Days of Exercise per Week: Not on file   Minutes of Exercise per Session: Not on file  Stress:    Feeling of Stress : Not on file  Social Connections:    Frequency of Communication with Friends and Family: Not on file   Frequency of Social Gatherings with Friends and  Family: Not on file   Attends Religious Services: Not on file   Active Member of Clubs or Organizations: Not on file   Attends Archivist Meetings: Not on file   Marital Status: Not on file  Intimate Partner Violence:    Fear of Current or Ex-Partner: Not on file   Emotionally Abused: Not on file   Physically Abused: Not on file   Sexually Abused: Not on file    No family history on file.   Current Outpatient Medications:    amLODipine (NORVASC) 10 MG tablet, Take 1 tablet (10 mg total) by mouth daily., Disp: 30 tablet, Rfl: 0   aspirin EC 81 MG EC tablet, Take 1 tablet (81 mg total) by mouth daily. Swallow whole., Disp: 30 tablet, Rfl: 2   clopidogrel (PLAVIX) 75 MG tablet, Take 1 tablet (75 mg total) by mouth daily for 21 days., Disp: 21 tablet, Rfl: 0   folic acid (FOLVITE) 1 MG tablet, Take 1 mg by mouth daily.,  Disp: , Rfl:    acetaminophen (TYLENOL) 325 MG tablet, Take 2 tablets (650 mg total) by mouth every 6 (six) hours as needed for mild pain (or Fever >/= 101). (Patient not taking: Reported on 04/26/2020), Disp: , Rfl:    axitinib (INLYTA) 5 MG tablet, Take 1 tablet (5 mg total) by mouth 2 (two) times daily. (Patient not taking: Reported on 04/21/2020), Disp: 60 tablet, Rfl: 0   HYDROcodone-acetaminophen (NORCO/VICODIN) 5-325 MG tablet, Take 1/2 or 1 pill every 6 hours as needed for pain. (Patient not taking: Reported on 04/21/2020), Disp: 30 tablet, Rfl: 0   lidocaine-prilocaine (EMLA) cream, Apply to affected area once (Patient not taking: Reported on 04/04/2020), Disp: 30 g, Rfl: 3   ondansetron (ZOFRAN) 8 MG tablet, Take 1 tablet (8 mg total) by mouth 2 (two) times daily as needed (Nausea or vomiting). (Patient not taking: Reported on 04/04/2020), Disp: 30 tablet, Rfl: 1   traMADol (ULTRAM) 50 MG tablet, Take 50 mg by mouth every 6 (six) hours as needed. Take 1/2 tab Q 6 hrs , prn (Patient not taking: Reported on 04/27/2020), Disp: , Rfl:  No current  facility-administered medications for this visit.  Facility-Administered Medications Ordered in Other Visits:    heparin lock flush 100 unit/mL, 500 Units, Intravenous, Once, Kalvyn Desa, Anderson Malta E, NP   sodium chloride flush (NS) 0.9 % injection 10 mL, 10 mL, Intravenous, PRN, Jacquelin Hawking, NP, 10 mL at 04/27/20 1046  Physical exam:  Vitals:   04/27/20 1054  BP: (!) 138/56  Thornton: 83  Resp: 20  Temp: (!) 97.1 F (36.2 C)  TempSrc: Tympanic  SpO2: 99%   Physical Exam Constitutional:      Appearance: Normal appearance.  HENT:     Head: Normocephalic and atraumatic.  Eyes:     Pupils: Pupils are equal, round, and reactive to light.  Cardiovascular:     Rate and Rhythm: Normal rate and regular rhythm.     Heart sounds: Normal heart sounds. No murmur heard.   Pulmonary:     Effort: Pulmonary effort is normal.     Breath sounds: Normal breath sounds. No wheezing.  Abdominal:     General: Bowel sounds are normal. There is no distension.     Palpations: Abdomen is soft.     Tenderness: There is no abdominal tenderness.  Musculoskeletal:        General: Normal range of motion.     Cervical back: Normal range of motion.  Skin:    General: Skin is warm and dry.     Findings: No rash.  Neurological:     Mental Status: She is alert and oriented to person, place, and time.  Psychiatric:        Judgment: Judgment normal.      CMP Latest Ref Rng & Units 04/26/2020  Glucose 70 - 99 mg/dL 98  BUN 8 - 23 mg/dL 18  Creatinine 0.44 - 1.00 mg/dL 0.90  Sodium 135 - 145 mmol/L 126(L)  Potassium 3.5 - 5.1 mmol/L 4.6  Chloride 98 - 111 mmol/L 98  CO2 22 - 32 mmol/L 19(L)  Calcium 8.9 - 10.3 mg/dL 7.2(L)  Total Protein 6.5 - 8.1 g/dL 6.6  Total Bilirubin 0.3 - 1.2 mg/dL 0.6  Alkaline Phos 38 - 126 U/L 91  AST 15 - 41 U/L 40  ALT 0 - 44 U/L 14   CBC Latest Ref Rng & Units 04/27/2020  WBC 4.0 - 10.5 K/uL 12.7(H)  Hemoglobin 12.0 - 15.0  g/dL 9.0(L)  Hematocrit 36 - 46 % 29.5(L)   Platelets 150 - 400 K/uL 249    No images are attached to the encounter.  EEG  Result Date: 04/22/2020 Alexis Goodell, MD     04/22/2020  7:10 PM ELECTROENCEPHALOGRAM REPORT Patient: LEYANI GARGUS       Room #: 129A-AA EEG No. ID: [id number] Age: 74 y.o.        Sex: female Requesting Physician: Amery Report Date:  04/22/2020       Interpreting Physician: Alexis Goodell History: AFIA MESSENGER is an 74 y.o. female with altered mental status Medications: Norvasc, ASA Conditions of Recording:  This is a 21 channel routine scalp EEG performed with bipolar and monopolar montages arranged in accordance to the international 10/20 system of electrode placement. One channel was dedicated to EKG recording. The patient is in the lethargic state. Description:  The background activity is not continuous.  There are short periods when an alpha background rhythm can be achieved but these alternate with frequent, generalized periods of polymorphic delta activity that is most prominent frontally and has the characteristics of FIRDA.  These periods last from 5-6 seconds.  There are also noted intermittent discharges of triphasic morphology.  Hyperventilation was not performed.  Intermittent photic stimulation was performed but failed to illicit any change in the tracing.  IMPRESSION: This is an abnormal electroencephalogram secondary to the presence of frequent periods of slowing that are more prominent frontally (FIRDA) and triphasic waves.  These findings may be seen with a diffuse cerebral disturbance that is etiologically nonspecific, but may include a metabolic encephalopathy, among other possibilities.  Alexis Goodell, MD Neurology 04/22/2020, 7:05 PM   DG Chest 2 View  Result Date: 04/20/2020 CLINICAL DATA:  74 year old female with weakness. Bilateral lower extremity swelling. Metastatic renal cell carcinoma. EXAM: CHEST - 2 VIEW COMPARISON:  Chest CT 03/03/2020 and earlier. FINDINGS: Lower lung volumes. Right  chest power port. Stable mediastinal contours. Small bilateral pleural effusions. Increased bilateral pulmonary interstitial opacity, with nodular pulmonary metastasis most visible at the left lung base. Paucity of bowel gas in the upper abdomen. Degenerative osseous changes. No destructive osseous lesion identified radiographically. IMPRESSION: 1. Pulmonary metastatic disease re-demonstrated with new small pleural effusions since August. 2. Paucity of bowel gas in the upper abdomen.  Query ascites. Electronically Signed   By: Genevie Ann M.D.   On: 04/20/2020 22:24   CT Head Wo Contrast  Result Date: 04/20/2020 CLINICAL DATA:  Mental status changes, history of renal cell carcinoma EXAM: CT HEAD WITHOUT CONTRAST TECHNIQUE: Contiguous axial images were obtained from the base of the skull through the vertex without intravenous contrast. COMPARISON:  02/26/2020 FINDINGS: Brain: Mild atrophic changes and chronic white matter ischemic changes are seen stable from the prior exam. No findings to suggest acute hemorrhage, acute infarction or space-occupying mass lesion are noted. Vascular: No hyperdense vessel or unexpected calcification. Skull: Normal. Negative for fracture or focal lesion. Sinuses/Orbits: No acute finding. Other: Scalp soft tissue nodule is noted on the left near the vertex stable from the prior exam. IMPRESSION: Chronic atrophic and ischemic changes without acute abnormality. Stable soft tissue scalp lesion near the vertex on the left. Smaller nodule is noted in the midline anteriorly. Electronically Signed   By: Inez Catalina M.D.   On: 04/20/2020 23:03   MR BRAIN WO CONTRAST  Result Date: 04/21/2020 CLINICAL DATA:  Mental status change, unknown cause. Additional history obtained from [place] with medical history  of recently diagnosed renal cell carcinoma metastatic to bone and lung on chemotherapy. EXAM: MRI HEAD WITHOUT CONTRAST TECHNIQUE: Multiplanar, multiecho Thornton  sequences of the brain and surrounding structures were obtained without intravenous contrast. COMPARISON:  Noncontrast head CT 04/20/2020. FINDINGS: Brain: Stable, mild generalized cerebral atrophy. There are subcentimeter foci of restricted diffusion within the posterior right frontal lobe white matter (series 2, image 40), within the right frontoparietal white matter (series 2, image 38), within the left parietal lobe (series 2, image 37). An additional subcentimeter focus of restricted diffusion is questioned within the left posterior frontal lobe white matter (series 2, image 37). Advanced multifocal T2/FLAIR hyperintensity within the cerebral white matter which is nonspecific, but consistent with chronic small vessel ischemic disease. 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 No chronic intracranial blood products. No extra-axial fluid collection. No midline shift. Vascular: Expected proximal arterial flow voids. Skull and upper cervical spine: No focal marrow lesion. Sinuses/Orbits: Visualized orbits show no acute finding. Mild ethmoid and maxillary sinus mucosal thickening. No significant mastoid effusion. Other: Again demonstrated is a 3.2 cm left parietal scalp mass (series 6, image 17). Also redemonstrated is a subcentimeter nodular lesion within the midline anterior scalp (series 6, image 12). IMPRESSION: Subcentimeter foci of restricted diffusion within the right frontoparietal white matter and left parietal white matter (3 sites total). An additional subcentimeter focus of restricted diffusion is questioned within the posterior left frontal lobe white matter. These foci may reflect acute infarcts and are suspicious for an embolic process. Given the history of recently diagnosed renal cell carcinoma, tiny metastases cannot be excluded and follow-up contrast-enhanced MR imaging should be considered. Mild generalized cerebral atrophy. Advanced cerebral white matter chronic  small vessel ischemic disease. 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 Redemonstrated 3.2 cm left parietal scalp mass as well as subcentimeter nodular lesion within the midline anterior scalp. Correlate with direct visualization and consider tissue sampling if not already performed. Electronically Signed   By: Kellie Simmering DO   On: 04/21/2020 11:39   MR BRAIN W CONTRAST  Result Date: 04/21/2020 CLINICAL DATA:  74 year old female with altered mental status. Metastatic renal cell carcinoma. Multiple small embolic infarcts versus less likely small hyperdense metastases, suspected in the hemispheres on MRI earlier today. EXAM: MRI HEAD WITH CONTRAST TECHNIQUE: Multiplanar, multiecho Thornton sequences of the brain and surrounding structures were obtained with intravenous contrast. CONTRAST:  26m GADAVIST GADOBUTROL 1 MMOL/ML IV SOLN COMPARISON:  Noncontrast brain MRI 1120 hours today. FINDINGS: No abnormal intracranial enhancement identified. No enhancement corresponding to the multiple small DWI lesions seen earlier today. No dural thickening. No intracranial mass effect. No ventriculomegaly. Basilar cisterns remain normal. The major dural venous sinuses are enhancing and appear to be patent. Negative visible cervical spine and spinal cord. Heterogeneous bone marrow signal in the calvarium but no enhancing skull lesion identified. However, there is enhancement associated with the lobulated left vertex scalp lesion (series 28, image 14). IMPRESSION: 1. No evidence of cerebral metastatic disease. No enhancement associated with the small DWI lesions demonstrated earlier today, which are therefore most compatible with multiple small infarcts. 2. The irregular 3.2 cm left vertex scalp soft mass is enhancing. Electronically Signed   By: HGenevie AnnM.D.   On: 04/21/2020 18:46   MR Lumbar Spine W Wo Contrast  Result Date: 04/21/2020 CLINICAL DATA:  74year old female with altered mental  status. Metastatic renal cell carcinoma. Staging. EXAM: MRI LUMBAR SPINE WITHOUT AND  WITH CONTRAST TECHNIQUE: Multiplanar and multiecho Thornton sequences of the lumbar spine were obtained without and with intravenous contrast. CONTRAST:  45m GADAVIST GADOBUTROL 1 MMOL/ML IV SOLN COMPARISON:  CT Abdomen and Pelvis 03/04/2020. FINDINGS: Segmentation:  Normal on the comparison CT. Alignment:  Stable lumbar lordosis since August. Vertebrae: Widespread spinal vertebral metastases. And much of the bony metastatic disease seems to be poorly enhancing (such as the expansile lesion in the right L1 posterior elements seen on series 13, image 7 and postcontrast series 31, image 7). The T12 vertebra appear spared. The L4 and L5 vertebra appear spared. (There is L3 spinous process tumor suspected). Scattered involvement of the visible sacrum and medial iliac bones. Mild pathologic compression fracture of L2 (series 11, image 8 is new since August. Conus medullaris and cauda equina: Conus extends to the T12 level. No lower spinal cord or conus signal abnormality. No abnormal intradural enhancement. There is mild dural thickening and/or early epidural tumor along the right posterior thecal sac at L1. No other lumbar epidural tumor is identified. The posterior element tumor at L1 is associated with some extraosseous extension into the muscle, as well as regional erector spinae muscle edema (series 11, image 8). Paraspinal and other soft tissues: Abnormal right kidney. Abnormally thickened and nodular visible right hemidiaphragm. Bulky gallstones (series 11, image 1). Large benign left renal cyst. Evidence of generalized intra-abdominal and subcutaneous edema. Diverticulosis of large bowel in the pelvis. Disc levels: Moderate for age lumbar spine degeneration. There is mild degenerative spinal stenosis (such as at L2-L3). There is no malignant neural impingement at this time. IMPRESSION: 1. Widespread osseous metastatic disease in the  visible spine and pelvis. A mild pathologic compression fracture of L2 is new since August. 2. Isolated early epidural tumor along the right posterior thecal sac at L1. No other lumbar epidural tumor. Extraosseous extension of tumor into the right erector spinae muscle at L1. No malignant neural impingement at this time. 3. Superimposed lumbar spine degeneration. Electronically Signed   By: HGenevie AnnM.D.   On: 04/21/2020 18:55   NM Bone Scan Whole Body  Result Date: 04/06/2020 CLINICAL DATA:  Lower back and pelvic pain for couple weeks, history of renal cell carcinoma EXAM: NUCLEAR MEDICINE WHOLE BODY BONE SCAN TECHNIQUE: Whole body anterior and posterior images were obtained approximately 3 hours after intravenous injection of radiopharmaceutical. RADIOPHARMACEUTICALS:  20.917 mCi Technetium-968mDP IV COMPARISON:  None Correlation: CT abdomen and pelvis 03/04/2020, CT chest 03/03/2020 FINDINGS: Abnormal tracer uptake identified at inferior RIGHT scapula, L1 vertebral body, RIGHT L2 vertebra, and distal LEFT femoral metaphysis suspicious for osseous metastatic disease. Degenerative type uptake at shoulders, sternoclavicular joints, and knees. Soft tissue distribution of tracer unremarkable. Minimal RIGHT renal tracer corresponding to known large RIGHT renal neoplasm decreased RIGHT renal function. Photopenic defect at mid LEFT kidney corresponding to large cyst on CT. IMPRESSION: Foci of abnormal tracer uptake at the inferior RIGHT scapula, L1 and L2 vertebral bodies, and distal LEFT femoral metaphysis consistent with osseous metastases. Large LEFT renal cyst and known large RIGHT renal neoplasm with poor RIGHT renal function. Electronically Signed   By: MaLavonia Dana.D.   On: 04/06/2020 11:23   [place]otid Bilateral  Result Date: 04/23/2020 CLINICAL DATA:  [age]ear old female with stroke-like symptoms. EXAM: BILATERAL CAROTID DUPLEX ULTRASOUND TECHNIQUE: GrPearline Cablescale imaging, color Doppler and duplex  ultrasound were performed of bilateral carotid and vertebral arteries in the neck. COMPARISON:  None. FINDINGS: Criteria: Quantification of carotid stenosis is based on  velocity parameters that correlate the residual internal carotid diameter with NASCET-based stenosis levels, using the diameter of the distal internal carotid lumen as the denominator for stenosis measurement. The following velocity measurements were obtained: RIGHT ICA: Peak systolic velocity 59 cm/sec, End diastolic velocity 9 cm/sec CCA: Peak systolic velocity 74 cm/sec SYSTOLIC ICA/CCA RATIO:  0.8 ECA: Peak systolic velocity 71 cm/sec LEFT ICA: Peak systolic velocity 69 cm/sec, End diastolic velocity 11 cm/sec CCA: 58 cm/sec SYSTOLIC ICA/CCA RATIO:  1.2 ECA: 63 cm/sec RIGHT CAROTID ARTERY: Minimal atherosclerotic plaque formation at the carotid bifurcation. No significant tortuosity. Normal low resistance waveforms. RIGHT VERTEBRAL ARTERY:  Antegrade flow. LEFT CAROTID ARTERY: Minimal atherosclerotic plaque formation at the carotid bifurcation. No significant tortuosity. Normal low resistance waveforms. LEFT VERTEBRAL ARTERY:  Antegrade flow. Upper extremity non-invasive blood pressures: Not obtained. IMPRESSION: 1. Right carotid artery system: Patent without significant atherosclerotic plaque formation. 2. Left carotid artery system: Patent without significant atherosclerotic plaque formation. 3.  Vertebral artery system: Patent with antegrade flow bilaterally. Ruthann Cancer, MD [facility] Electronically Signed   By: Ruthann Cancer MD   On: 04/23/2020 11:28   US Venous Img Lower Bilateral  Result Date: 04/21/2020 CLINICAL DATA:  Bilateral lower extremity edema, concern for deep venous thrombosis. History of right renal cell carcinoma. No injury. No pain. EXAM: BILATERAL LOWER EXTREMITY VENOUS DOPPLER ULTRASOUND TECHNIQUE: Gray-scale sonography with compression, as well as color and  duplex ultrasound, were performed to evaluate the deep venous system(s) from the level of the common femoral vein through the popliteal and proximal calf veins. COMPARISON:  CT abdomen pelvis 03/04/2020. FINDINGS: VENOUS Normal compressibility of the common femoral, superficial femoral, and popliteal veins, as well as the visualized calf veins. Visualized portions of profunda femoral vein and great saphenous vein unremarkable. No filling defects to suggest DVT on grayscale or color Doppler imaging. Doppler waveforms show normal direction of venous flow, normal respiratory plasticity and response to augmentation. Limited views of the contralateral common femoral vein are unremarkable. OTHER None. Limitations: none IMPRESSION: No femoropopliteal DVT nor evidence of DVT within the visualized calf veins. If clinical symptoms are inconsistent or if there are persistent or worsening symptoms, further imaging (possibly involving the iliac veins) may be warranted. Electronically Signed   By: Iven Finn M.D.   On: 04/21/2020 02:14   ECHOCARDIOGRAM COMPLETE  Result Date: 04/06/2020    ECHOCARDIOGRAM REPORT   Patient Name:   ROBEN TATSCH Date of Exam: 04/06/2020 Medical Rec #:  [id number]     Height:       62.0 in Accession #:    [id number]    Weight:       150.1 lb Date of Birth:  05/14/1946      BSA:          1.692 m Patient Age:    49 years      BP:           119/68 mmHg Patient Gender: F             HR:           88 bpm. Exam Location:  Outpatient Procedure: 2D Echo, Cardiac Doppler and Color Doppler Indications:    Chemo evaluation  History:        Patient has no prior history of Echocardiogram examinations.                 Renal cell carcinoma.  Sonographer:    Dustin Flock  Referring Phys: [id number] [place] Comments: Patient is morbidly obese. Global longitudinal strain was attempted. IMPRESSIONS  1. Left ventricular ejection fraction, by estimation, is 70 to 75%. The left ventricle has  hyperdynamic function. The left ventricle has no regional wall motion abnormalities. There is mild left ventricular hypertrophy. Left ventricular diastolic parameters are consistent with Grade I diastolic dysfunction (impaired relaxation).  2. Right ventricular systolic function is normal. The right ventricular size is normal. There is normal pulmonary artery systolic pressure. The estimated right ventricular systolic pressure is 65.4 mmHg.  3. The mitral valve is grossly normal. No evidence of mitral valve regurgitation.  4. The aortic valve is tricuspid. Aortic valve regurgitation is not visualized. Aortic valve mean gradient measures 9.0 mmHg.  5. The inferior vena cava is normal in size with greater than 50% respiratory variability, suggesting right atrial pressure of 3 mmHg. Comparison(s): No prior Echocardiogram. FINDINGS  Left Ventricle: Left ventricular ejection fraction, by estimation, is 70 to 75%. The left ventricle has hyperdynamic function. The left ventricle has no regional wall motion abnormalities. The left ventricular internal cavity size was normal in size. There is mild left ventricular hypertrophy. Left ventricular diastolic parameters are consistent with Grade I diastolic dysfunction (impaired relaxation). Indeterminate filling pressures. Right Ventricle: The right ventricular size is normal. No increase in right ventricular wall thickness. Right ventricular systolic function is normal. There is normal pulmonary artery systolic pressure. The tricuspid regurgitant velocity is 1.59 m/s, and  with an assumed right atrial pressure of 3 mmHg, the estimated right ventricular systolic pressure is 65.0 mmHg. Left Atrium: Left atrial size was normal in size. Right Atrium: Right atrial size was normal in size. Pericardium: There is no evidence of pericardial effusion. Mitral Valve: The mitral valve is grossly normal. No evidence of mitral valve regurgitation. Tricuspid Valve: The tricuspid valve is  grossly normal. Tricuspid valve regurgitation is trivial. Aortic Valve: The aortic valve is tricuspid. Aortic valve regurgitation is not visualized. Aortic valve mean gradient measures 9.0 mmHg. Aortic valve peak gradient measures 18.7 mmHg. Aortic valve area, by VTI measures 2.61 cm. Pulmonic Valve: The pulmonic valve was normal in structure. Pulmonic valve regurgitation is not visualized. Aorta: The aortic root and ascending aorta are structurally normal, with no evidence of dilitation. Venous: The inferior vena cava is normal in size with greater than 50% respiratory variability, suggesting right atrial pressure of 3 mmHg. IAS/Shunts: No atrial level shunt detected by color flow Doppler.  LEFT VENTRICLE PLAX 2D LVIDd:         3.90 cm  Diastology LVIDs:         2.30 cm  LV e' medial:    6.64 cm/s LV PW:         1.20 cm  LV E/e' medial:  10.9 LV IVS:        1.10 cm  LV e' lateral:   8.70 cm/s LVOT diam:     2.00 cm  LV E/e' lateral: 8.3 LV SV:         98 LV SV Index:   58 LVOT Area:     3.14 cm  RIGHT VENTRICLE             IVC RV Basal diam:  2.00 cm     IVC diam: 1.20 cm RV S prime:     13.70 cm/s TAPSE (M-mode): 1.9 cm LEFT ATRIUM             Index  RIGHT ATRIUM          Index LA diam:        3.40 cm 2.01 cm/m  RA Area:     9.56 cm LA Vol (A2C):   61.7 ml 36.46 ml/m RA Volume:   14.20 ml 8.39 ml/m LA Vol (A4C):   26.9 ml 15.90 ml/m LA Biplane Vol: 43.9 ml 25.94 ml/m  AORTIC VALVE AV Area (Vmax):    2.08 cm AV Area (Vmean):   2.41 cm AV Area (VTI):     2.61 cm AV Vmax:           216.00 cm/s AV Vmean:          141.000 cm/s AV VTI:            0.375 m AV Peak Grad:      18.7 mmHg AV Mean Grad:      9.0 mmHg LVOT Vmax:         143.00 cm/s LVOT Vmean:        108.000 cm/s LVOT VTI:          0.312 m LVOT/AV VTI ratio: 0.83  AORTA Ao Root diam: 3.20 cm Ao Asc diam:  3.20 cm MITRAL VALVE               TRICUSPID VALVE MV Area (PHT): 2.99 cm    TR Peak grad:   10.1 mmHg MV Decel Time: 254 msec    TR Vmax:         159.00 cm/s MV E velocity: 72.40 cm/s MV A velocity: 88.70 cm/s  SHUNTS MV E/A ratio:  0.82        Systemic VTI:  0.31 m                            Systemic Diam: 2.00 cm Lyman Bishop MD Electronically signed by Lyman Bishop MD Signature Date/Time: 04/06/2020/3:52:03 PM    Final    ECHO TEE  Result Date: 04/22/2020    TRANSESOPHOGEAL ECHO REPORT   Patient Name:   ANALISA SLEDD Date of Exam: 04/22/2020 Medical Rec #:  [id number]     Height:       62.0 in Accession #:    [id number]    Weight:       145.0 lb Date of Birth:  Jun 19, 1946      BSA:          1.667 m Patient Age:    35 years      BP:           144/129 mmHg Patient Gender: F             HR:           84 bpm. Exam Location:  [facility] Procedure: Transesophageal Echo, Cardiac Doppler and Color Doppler Indications:     Not listed on check-in form  History:         Patient has prior history of Echocardiogram examinations, most                  recent 04/06/2020. No medical history on file.  Sonographer:     Sherrie Sport RDCS (AE) Referring Phys:  [place] Diagnosing Phys: Ida Rogue MD PROCEDURE: After discussion of the risks and benefits of a TEE, an informed consent was obtained from a family member. The transesophogeal probe was passed without difficulty through the esophogus of the patient. Local oropharyngeal anesthetic  was provided with Cetacaine and viscous lidocaine. Sedation performed by performing physician. Patients was under conscious sedation during this procedure. Anesthetic administered: 33mg of Fentanyl, 2.021mof Versed. Image quality was excellent. The patient's vital signs; including heart rate, blood pressure, and oxygen saturation; remained stable throughout the procedure. The patient developed no complications during the procedure. IMPRESSIONS  1. No thrombus noted in the left atrium or left atrial appendage.  2. Left ventricular ejection fraction, by estimation, is 60 to 65%. The left ventricle has normal function.  The left ventricle has no regional wall motion abnormalities. There is mild left ventricular hypertrophy.  3. Right ventricular systolic function is normal. The right ventricular size is normal.  4. The mitral valve is normal in structure. Mild mitral valve regurgitation.  5. There is Moderate (Grade III) atheroma plaque involving the transverse and descending aorta.  6. No valve vegetation Conclusion(s)/Recommendation(s): Normal biventricular function without evidence of hemodynamically significant valvular heart disease. FINDINGS  Left Ventricle: Left ventricular ejection fraction, by estimation, is 60 to 65%. The left ventricle has normal function. The left ventricle has no regional wall motion abnormalities. The left ventricular internal cavity size was normal in size. There is  mild left ventricular hypertrophy. Right Ventricle: The right ventricular size is normal. No increase in right ventricular wall thickness. Right ventricular systolic function is normal. Left Atrium: Left atrial size was normal in size. No left atrial/left atrial appendage thrombus was detected. Right Atrium: Right atrial size was normal in size. Pericardium: There is no evidence of pericardial effusion. Mitral Valve: The mitral valve is normal in structure. Mild mitral valve regurgitation. No evidence of mitral valve stenosis. Tricuspid Valve: The tricuspid valve is normal in structure. Tricuspid valve regurgitation is mild . No evidence of tricuspid stenosis. Aortic Valve: The aortic valve is normal in structure. Aortic valve regurgitation is not visualized. No aortic stenosis is present. Pulmonic Valve: The pulmonic valve was normal in structure. Pulmonic valve regurgitation is not visualized. No evidence of pulmonic stenosis. Aorta: The aortic root is normal in size and structure. There is moderate (Grade III) atheroma plaque involving the transverse and descending aorta. Venous: The inferior vena cava is normal in size with greater  than 50% respiratory variability, suggesting right atrial pressure of 3 mmHg. IAS/Shunts: No atrial level shunt detected by color flow Doppler. Agitated saline contrast was given intravenously to evaluate for intracardiac shunting. TiIda RogueD Electronically signed by TiIda RogueD Signature Date/Time: 04/22/2020/1:06:47 PM    Final    DG FEMUR MIN 2 VIEWS LEFT  Result Date: 04/08/2020 CLINICAL DATA:  Left femur met seen on bone scan. Evaluate bone integrity. EXAM: LEFT FEMUR 2 VIEWS COMPARISON:  Bone scan 04/05/2020 FINDINGS: Two view exam of the left femur shows no evidence for worrisome lytic or sclerotic osseous abnormality. Patient has substantial tricompartmental degenerative change in the knee without metaphyseal lesion. Uptake on the bone scan could potentially have been degenerative. IMPRESSION: No suspicious lytic or sclerotic osseous abnormality in the metaphysis of the distal left femur to suggest metastatic disease. Marked tricompartmental degenerative changes. Degenerative disease could account for some of the uptake seen in the distal left femur. Electronically Signed   By: ErMisty Stanley.D.   On: 04/08/2020 15:05     Assessment and plan- Patient is a [age].o. female who presents for delirium and hallucinations X 1 day.   Stage IV renal cell carcinoma: Status post 1 cycle of Keytruda and Inlyta.  Inlyta currently on hold secondary  to poor tolerance.  Most recent imaging from the emergency room showed metastatic disease to the spine and a compression fracture of L2.  PET scan from initial work-up showed disease in scapula L2 and left femoral metaphysis along with large kidney mass.  Plan is for cycle 2 Keytruda tomorrow.   Delirium/hallucinations: Likely multifactorial.  Her sodium level is 126 and she has a low calcium level.  She also has poor oral intake.  Previously diagnosed with metabolic encephalopathy.  Ammonia level was normal.  Possibility of an infection with elevated  white count.  Urinalysis from ED a few days ago showed some blood. We will recheck UA today.  We also plan to give her some IV fluids and see if symptoms irmprove.  She does have a referral for neurology-Dr. Brigitte Thornton but has not been seen quite yet.  She had a CT scan of her head which was negative and a MRI of her brain which showed possible mini strokes a few days ago.  She was started on Plavix.  She has been compliant.  Weakness: Secondary to malnutrition and malignancy.  Husband does not feel comfortable taking care of her at home by himself.  They have had to have family and friends stay around-the-clock with him.  We spoke at length about her going home versus being hospitalized.  Mr. Bisig and Mariah Thornton the family friend do not feel comfortable taking her home at this time.  They are also interested in speaking with Altha Harm palliative care NP about future care.  Plan: Recheck labs today. (Mild leukocytosis) Vital signs are stable. Give a liter of normal saline. Repeat urinalysis-possible UTI???  Given she will be seen in the emergency room, will let them make decision for antibiotics.  Disposition: Patient was taken directly to the emergency room by Sharyn Lull, [place]. Her appointment for tomorrow will need to be canceled. I called and spoke to [facility] the charge RN in the emergency room who will place patient in family room while waiting to be seen.  Addendum: Spoke with Dr. Mike Gip who agrees with admission although patient needs to be seen by neurology.  As far as her cancer is concerned, she is doing great.  Dr. Mike Gip is unsure of her neurological changes.  Dr. Mike Gip has asked that I reach out to Dr. Alexis Goodell in neurology to inform her of what is going on with the patient.  Visit Diagnosis 1. Dehydration   2. Renal cell carcinoma of right kidney ([place])   3. Delirium   4. Hallucinations     Patient expressed understanding and was in agreement with this plan. She also  understands that She can call clinic at any time with any questions, concerns, or complaints.   Greater than 50% was spent in counseling and coordination of care with this patient including but not limited to discussion of the relevant topics above (See A&P) including, but not limited to diagnosis and management of acute and chronic medical conditions.   Thank you for allowing me to participate in the care of this very pleasant patient.    Jacquelin Hawking, NP [place] [facility] Cell - [phone] Pager- [phone] 04/27/2020 1:25 PM

## 2020-04-27 NOTE — ED Provider Notes (Signed)
[facility] Emergency Department Provider Note  ____________________________________________   First MD Initiated Contact with Patient 04/27/20 1750     (approximate)  I have reviewed the triage vital signs and the nursing notes.   HISTORY  Chief Complaint Altered Mental Status   HPI Mariah Thornton is a 74 y.o. female with past medical history of HTN, CVA, normocytic anemia, folate deficiency, bilateral lower extremity edema, chronic fatigue,  and stage IV renal cell carcinoma  (followed by Dr. Mike Gip) s/p 1 cycle of Keytruda on 04/07/2020 and on oral Inlyta presents for assessment of hallucinations that began last night.  Patient denies any acute symptoms but does endorse some hallucinations including seeing animals around her and endorsing some mild soreness from sitting still all day.  She denies any headache, chest pain, cough, shortness of breath, vomiting, diarrhea, or burning with urination.  She states she fell several weeks ago but no falls last couple days.  No clear alleviating or aggravating factors.  No prior similar cyst.  She is currently living with her husband who is not at bedside at this time.   History reviewed. No pertinent past medical history.  Patient Active Problem List   Diagnosis Date Noted  . Cerebral infarction due to embolism of cerebral artery ([place]) 04/26/2020  . Fatigue   . Palliative care encounter   . Somnolence 04/21/2020  . Low back pain 04/21/2020  . Hypertension 04/20/2020  . Hyperkalemia 04/20/2020  . Bilateral lower extremity edema 04/20/2020  . Bone metastases ([place]) 04/10/2020  . Cancer-related pain 04/10/2020  . Encounter for antineoplastic immunotherapy 04/07/2020  . Goals of care, counseling/discussion 03/22/2020  . Renal cell carcinoma of right kidney ([place]) 03/16/2020  . Normocytic anemia 03/16/2020  . Folate deficiency 03/16/2020  . Hypercalcemia 03/03/2020    Past Surgical History:  Procedure  Laterality Date  . PORTA CATH INSERTION N/A 03/28/2020   Procedure: PORTA CATH INSERTION;  Surgeon: Algernon Huxley, MD;  Location: [facility];  Service: Cardiovascular;  Laterality: N/A;  . TEE WITHOUT CARDIOVERSION N/A 04/22/2020   Procedure: TRANSESOPHAGEAL ECHOCARDIOGRAM (TEE);  Surgeon: Minna Merritts, MD;  Location: [facility];  Service: Cardiovascular;  Laterality: N/A;    Prior to Admission medications   Medication Sig Start Date End Date Taking? Authorizing Provider  acetaminophen (TYLENOL) 325 MG tablet Take 2 tablets (650 mg total) by mouth every 6 (six) hours as needed for mild pain (or Fever >/= 101). Patient not taking: Reported on 04/26/2020 04/23/20   Nolberto Hanlon, MD  amLODipine (NORVASC) 10 MG tablet Take 1 tablet (10 mg total) by mouth daily. 04/24/20 05/24/20  Nolberto Hanlon, MD  aspirin EC 81 MG EC tablet Take 1 tablet (81 mg total) by mouth daily. Swallow whole. 04/24/20   Nolberto Hanlon, MD  axitinib (INLYTA) 5 MG tablet Take 1 tablet (5 mg total) by mouth 2 (two) times daily. Patient not taking: Reported on 04/21/2020 03/22/20   Lequita Asal, MD  clopidogrel (PLAVIX) 75 MG tablet Take 1 tablet (75 mg total) by mouth daily for 21 days. 04/24/20 05/15/20  Nolberto Hanlon, MD  folic acid (FOLVITE) 1 MG tablet Take 1 mg by mouth daily.    [provider]  HYDROcodone-acetaminophen (NORCO/VICODIN) 5-325 MG tablet Take 1/2 or 1 pill every 6 hours as needed for pain. Patient not taking: Reported on 04/21/2020 04/14/20   Lequita Asal, MD  lidocaine-prilocaine (EMLA) cream Apply to affected area once Patient not taking: Reported on 04/04/2020  04/01/20   Lequita Asal, MD  ondansetron (ZOFRAN) 8 MG tablet Take 1 tablet (8 mg total) by mouth 2 (two) times daily as needed (Nausea or vomiting). Patient not taking: Reported on 04/04/2020 04/01/20   Lequita Asal, MD  traMADol (ULTRAM) 50 MG tablet Take 50 mg by mouth every 6 (six) hours as needed. Take 1/2  tab Q 6 hrs , prn Patient not taking: Reported on 04/27/2020    [provider]    Allergies Patient has no known allergies.  History reviewed. No pertinent family history.  Social History Social History   Tobacco Use  . Smoking status: Never Smoker  . Smokeless tobacco: Never Used  Vaping Use  . Vaping Use: Never used  Substance Use Topics  . Alcohol use: Yes    Alcohol/week: 1.0 standard drink    Types: 1 Cans of beer per week    Comment: occasional  . Drug use: Not Currently    Review of Systems  Review of Systems  Constitutional: Negative for chills and fever.  HENT: Negative for sore throat.   Eyes: Negative for pain.  Respiratory: Negative for cough and stridor.   Cardiovascular: Negative for chest pain.  Gastrointestinal: Negative for vomiting.  Genitourinary: Negative for dysuria.  Musculoskeletal: Positive for back pain ( "from sitting all day").  Skin: Negative for rash.  Neurological: Negative for seizures, loss of consciousness and headaches.  Psychiatric/Behavioral: Negative for suicidal ideas.  All other systems reviewed and are negative.     ____________________________________________   PHYSICAL EXAM:  VITAL SIGNS: ED Triage Vitals  Enc Vitals Group     BP 04/27/20 1310 (!) 117/51     Pulse Rate 04/27/20 1310 88     Resp 04/27/20 1310 18     Temp 04/27/20 1309 98 F (36.7 C)     Temp Source 04/27/20 1309 Axillary     SpO2 04/27/20 1310 95 %     Weight --      Height --      Head Circumference --      Peak Flow --      Pain Score 04/27/20 1310 0     Pain Loc --      Pain Edu? --      Excl. in [place]? --    Vitals:   04/27/20 1310 04/27/20 1645  BP: (!) 117/51 (!) 131/51  Pulse: 88 84  Resp: 18 18  Temp:    SpO2: 95% 96%   Physical Exam Vitals and nursing note reviewed.  Constitutional:      General: She is not in acute distress.    Appearance: She is well-developed.  HENT:     Head: Normocephalic and atraumatic.      Right Ear: External ear normal.     Left Ear: External ear normal.     Nose: Nose normal.  Eyes:     Conjunctiva/sclera: Conjunctivae normal.  Cardiovascular:     Rate and Rhythm: Normal rate and regular rhythm.     Heart sounds: No murmur heard.   Pulmonary:     Effort: Pulmonary effort is normal. No respiratory distress.     Breath sounds: Normal breath sounds.  Abdominal:     Palpations: Abdomen is soft.     Tenderness: There is no abdominal tenderness.  Musculoskeletal:     Cervical back: Neck supple.     Right lower leg: Edema present.     Left lower leg: Edema present.  Skin:  General: Skin is warm and dry.     Capillary Refill: Capillary refill takes less than 2 seconds.  Neurological:     General: No focal deficit present.     Mental Status: She is alert.     Patient is oriented to month and year but not date.  PERRLA.  EOMI.  She has symmetric strength in her bilateral upper and lower extremities.  Sensation is intact to light touch throughout her bilateral upper and lower extremities.  2+ bilateral radial pulse.  No CVA tenderness or tenderness over the T or L-spine.  Patient has an area of tenderness over the left and right paralumbar muscles.  No overlying skin changes. ____________________________________________   LABS (all labs ordered are listed, but only abnormal results are displayed)  Labs Reviewed  COMPREHENSIVE METABOLIC PANEL - Abnormal; Notable for the following components:      Result Value   Sodium 128 (*)    CO2 17 (*)    Calcium 7.4 (*)    Albumin 2.1 (*)    All other components within normal limits  CBC - Abnormal; Notable for the following components:   RBC 3.37 (*)    Hemoglobin 8.6 (*)    HCT 27.6 (*)    MCH 25.5 (*)    RDW 20.1 (*)    All other components within normal limits  PROTIME-INR - Abnormal; Notable for the following components:   Prothrombin Time 16.8 (*)    INR 1.4 (*)    All other components within normal limits  LACTIC  ACID, PLASMA - Abnormal; Notable for the following components:   Lactic Acid, Venous 2.2 (*)    All other components within normal limits  LACTIC ACID, PLASMA - Abnormal; Notable for the following components:   Lactic Acid, Venous 2.4 (*)    All other components within normal limits  URINE CULTURE  CULTURE, BLOOD (ROUTINE X 2)  CULTURE, BLOOD (ROUTINE X 2)  RESPIRATORY PANEL BY RT PCR (FLU A&B, COVID)  AMMONIA  URINE DRUG SCREEN, QUALITATIVE ([facility] ONLY)  APTT  LACTIC ACID, PLASMA  LACTIC ACID, PLASMA  BRAIN NATRIURETIC PEPTIDE  CBG MONITORING, ED   ____________________________________________ ____________________________________________  RADIOLOGY  ED MD interpretation: Chest x-ray shows no evidence of acute intracranial hemorrhage, mass-effect but does show evidence of chronic microvascular disease.  Chest x-ray shows evidence of small bilateral pleural effusions without focal consolidation, pneumothorax, significant edema, or other acute thoracic process.  Official radiology report(s): DG Chest 2 View  Result Date: 04/27/2020 CLINICAL DATA:  Altered mental status EXAM: CHEST - 2 VIEW COMPARISON:  04/20/2020, 03/03/2020 FINDINGS: Right-sided Port-A-Cath remains in place. Stable heart size. Atherosclerotic calcification of the aortic knob. Scattered nodular densities most prevalent within the left lung base, better characterized on recent CT. Small bilateral pleural effusions with associated streaky bibasilar opacities, likely atelectasis. No pneumothorax. IMPRESSION: Small bilateral pleural effusions with associated streaky bibasilar opacities, likely atelectasis. Electronically Signed   By: Davina Poke D.O.   On: 04/27/2020 16:49   CT Head Wo Contrast  Result Date: 04/27/2020 CLINICAL DATA:  Mental status change, unknown cause. EXAM: CT HEAD WITHOUT CONTRAST TECHNIQUE: Contiguous axial images were obtained from the base of the skull through the vertex without intravenous  contrast. COMPARISON:  MRI 04/21/2020, CT head 04/20/2020. FINDINGS: Brain: No evidence of acute large vascular territory infarction, hemorrhage, hydrocephalus, extra-axial collection or mass lesion/mass effect. Patchy white matter hypoattenuation appears similar to prior and most likely represents chronic microvascular ischemic disease. Vascular: Calcific atherosclerosis.  Skull: Similar left frontal scalp lesion near the vertex and smaller nodule at the midline anteriorly. No acute fracture. Sinuses/Orbits: Clear sinuses.  Unremarkable orbits. Other: No mastoid effusions. IMPRESSION: 1. No evidence of acute intracranial abnormality. 2. 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 3. Similar left frontal scalp lesion near the vertex and smaller nodule at the midline anteriorly. Electronically Signed   By: Margaretha Sheffield MD   On: 04/27/2020 13:59    ____________________________________________   PROCEDURES  Procedure(s) performed (including Critical Care):  .1-3 Lead EKG Interpretation Performed by: Lucrezia Starch, MD Authorized by: Lucrezia Starch, MD     Interpretation: normal     ECG rate assessment: normal     Rhythm: sinus rhythm     Ectopy: none     Conduction: normal       ____________________________________________   INITIAL IMPRESSION / ASSESSMENT AND PLAN / ED COURSE        Patient presents with above-stated history exam for assessment of visual hallucinations that began yesterday evening.  Patient is afebrile and hemodynamically stable on room air.  Patient does endorse hallucinations but otherwise has a nonfocal neuro exam no clear foci of infection on exam.  Differential includes but is not limited to encephalopathy secondary to toxic ingestion, metabolic derangement, acute infectious process, CVA, thyroid derangements, and other etiologies.  Patient denies any low suspicion for toxic ingestion.  Patient does not appear intoxicated.  CMP does  show evidence of hyponatremia with an NA of 128 although this appears to be close to patient's baseline and she had an NA of 132 5 days ago.  She does have a non-anion gap acidosis with a bicarb of 17.  Is also noted to have a low albumin at 2.1 but there are no other significant electrolyte or metabolic derangements identified on CMP.  Patient is noted to be anemic with a hemoglobin of 8.6.  However this is very close to patient's baseline and she has had multiple hemoglobins in the 9 range going back 7 days.  Ammonia is unremarkable.  Low suspicion for hepatic encephalopathy.  UDS is negative.  UA does show evidence of infection with moderate ileus, 11-20 WBCs and some rare bacteria.  There is no blood.  Is certainly possible this is causing patient's encephalopathy and hallucinations.  Is also possible her hyponatremia is contributing.  While patient does have no evidence of hypotension, fever, or elevated white blood cell count her lactic acid drawn in triage is elevated at 2.2.  Given elevated lactic acid and concern for infection I ordered blood and urine cultures and gave the patient Rocephin as well as IV fluids.  I will plan to admit to the medicine service for further evaluation management.  Do not believe hypertonic saline is indicated at this time.  ____________________________________________   FINAL CLINICAL IMPRESSION(S) / ED DIAGNOSES  Final diagnoses:  Altered mental status, unspecified altered mental status type  Cystitis  Hyponatremia  Low hemoglobin  Malignant neoplasm of kidney, unspecified laterality (HCC)  Hypoalbuminemia    Medications  lactated ringers bolus 1,000 mL (has no administration in time range)  cefTRIAXone (ROCEPHIN) 1 g in sodium chloride 0.9 % 100 mL IVPB (has no administration in time range)     ED Discharge Orders    None       Note:  This document was prepared using Dragon voice recognition software and may include unintentional dictation  errors.   Lucrezia Starch, MD 04/27/20 [phone]

## 2020-04-27 NOTE — Progress Notes (Signed)
Yesterday evening started hallucinating all night long. No one got any sleep. Pt picking at things, seeing things that are not there ie.. thinking she has tape in her hand. Thinks a body is on recliner with her. Ate an egg for breakfast. Drank some water here in clinic. Pt not walking much uses a walker but very unsteady at home. Pt was offered rehab after hospitalization on 10/16. Husband refused. It seems the family wishes she had gone. No one has slept since pt got home from hospital. Pt alert talkative. Oriented to herself and family member only. Denies pain at present. Rcving 1 liter of NS. Pt is not taking chemo pill (axitinib) has been off that for 1 week. Had multiple mini strokes during recent hospitalization.. Starts ""hollering " for help thinks she is falling. Family had to get a neighbor to help get the pt in the car to get here. Pt total care. Very little weight bearing if any getting into recliner.

## 2020-04-27 NOTE — ED Notes (Signed)
Pt placed on purwick at this time. Pt appears comfortable in bed, no further needs at this time. Pt confused and talking loudly, but does not seem disheveled or upset. Will leave door open and monitor frequently.

## 2020-04-28 ENCOUNTER — Inpatient Hospital Stay: Payer: Medicare Other

## 2020-04-28 ENCOUNTER — Encounter: Payer: Self-pay | Admitting: Hematology and Oncology

## 2020-04-28 ENCOUNTER — Telehealth: Payer: Self-pay | Admitting: Hematology and Oncology

## 2020-04-28 DIAGNOSIS — R4182 Altered mental status, unspecified: Secondary | ICD-10-CM | POA: Diagnosis not present

## 2020-04-28 DIAGNOSIS — Z20822 Contact with and (suspected) exposure to covid-19: Secondary | ICD-10-CM | POA: Diagnosis present

## 2020-04-28 DIAGNOSIS — E538 Deficiency of other specified B group vitamins: Secondary | ICD-10-CM | POA: Diagnosis present

## 2020-04-28 DIAGNOSIS — C649 Malignant neoplasm of unspecified kidney, except renal pelvis: Secondary | ICD-10-CM | POA: Diagnosis not present

## 2020-04-28 DIAGNOSIS — N3090 Cystitis, unspecified without hematuria: Secondary | ICD-10-CM | POA: Diagnosis not present

## 2020-04-28 DIAGNOSIS — N390 Urinary tract infection, site not specified: Secondary | ICD-10-CM | POA: Diagnosis not present

## 2020-04-28 DIAGNOSIS — B952 Enterococcus as the cause of diseases classified elsewhere: Secondary | ICD-10-CM | POA: Diagnosis present

## 2020-04-28 DIAGNOSIS — I10 Essential (primary) hypertension: Secondary | ICD-10-CM

## 2020-04-28 DIAGNOSIS — Z7982 Long term (current) use of aspirin: Secondary | ICD-10-CM | POA: Diagnosis not present

## 2020-04-28 DIAGNOSIS — Z79899 Other long term (current) drug therapy: Secondary | ICD-10-CM | POA: Diagnosis not present

## 2020-04-28 DIAGNOSIS — Z66 Do not resuscitate: Secondary | ICD-10-CM | POA: Diagnosis not present

## 2020-04-28 DIAGNOSIS — D630 Anemia in neoplastic disease: Secondary | ICD-10-CM | POA: Diagnosis present

## 2020-04-28 DIAGNOSIS — R443 Hallucinations, unspecified: Secondary | ICD-10-CM | POA: Diagnosis present

## 2020-04-28 DIAGNOSIS — R441 Visual hallucinations: Secondary | ICD-10-CM | POA: Diagnosis present

## 2020-04-28 DIAGNOSIS — C7951 Secondary malignant neoplasm of bone: Secondary | ICD-10-CM

## 2020-04-28 DIAGNOSIS — A4181 Sepsis due to Enterococcus: Secondary | ICD-10-CM | POA: Diagnosis present

## 2020-04-28 DIAGNOSIS — E871 Hypo-osmolality and hyponatremia: Secondary | ICD-10-CM | POA: Diagnosis present

## 2020-04-28 DIAGNOSIS — G9341 Metabolic encephalopathy: Secondary | ICD-10-CM | POA: Diagnosis present

## 2020-04-28 DIAGNOSIS — Z7902 Long term (current) use of antithrombotics/antiplatelets: Secondary | ICD-10-CM | POA: Diagnosis not present

## 2020-04-28 DIAGNOSIS — Z8673 Personal history of transient ischemic attack (TIA), and cerebral infarction without residual deficits: Secondary | ICD-10-CM | POA: Diagnosis not present

## 2020-04-28 DIAGNOSIS — Z9181 History of falling: Secondary | ICD-10-CM | POA: Diagnosis not present

## 2020-04-28 DIAGNOSIS — R54 Age-related physical debility: Secondary | ICD-10-CM | POA: Diagnosis present

## 2020-04-28 DIAGNOSIS — F05 Delirium due to known physiological condition: Secondary | ICD-10-CM | POA: Diagnosis present

## 2020-04-28 DIAGNOSIS — C641 Malignant neoplasm of right kidney, except renal pelvis: Secondary | ICD-10-CM | POA: Diagnosis present

## 2020-04-28 DIAGNOSIS — Z515 Encounter for palliative care: Secondary | ICD-10-CM | POA: Diagnosis not present

## 2020-04-28 DIAGNOSIS — R5381 Other malaise: Secondary | ICD-10-CM | POA: Diagnosis present

## 2020-04-28 DIAGNOSIS — E872 Acidosis: Secondary | ICD-10-CM | POA: Diagnosis present

## 2020-04-28 DIAGNOSIS — R6520 Severe sepsis without septic shock: Secondary | ICD-10-CM | POA: Diagnosis present

## 2020-04-28 LAB — BASIC METABOLIC PANEL
Anion gap: 10 (ref 5–15)
BUN: 17 mg/dL (ref 8–23)
CO2: 19 mmol/L — ABNORMAL LOW (ref 22–32)
Calcium: 7.5 mg/dL — ABNORMAL LOW (ref 8.9–10.3)
Chloride: 103 mmol/L (ref 98–111)
Creatinine, Ser: 0.71 mg/dL (ref 0.44–1.00)
GFR, Estimated: >60 mL/min (ref >60)
Glucose, Bld: 81 mg/dL (ref 70–99)
Potassium: 4.6 mmol/L (ref 3.5–5.1)
Sodium: 132 mmol/L — ABNORMAL LOW (ref 135–145)

## 2020-04-28 LAB — CBC
HCT: 27.3 % — ABNORMAL LOW (ref 36.0–46.0)
Hemoglobin: 8.2 g/dL — ABNORMAL LOW (ref 12.0–15.0)
MCH: 24.8 pg — ABNORMAL LOW (ref 26.0–34.0)
MCHC: 30 g/dL (ref 30.0–36.0)
MCV: 82.7 fL (ref 80.0–100.0)
Platelets: 216 10*3/uL (ref 150–400)
RBC: 3.3 MIL/uL — ABNORMAL LOW (ref 3.87–5.11)
RDW: 20.3 % — ABNORMAL HIGH (ref 11.5–15.5)
WBC: 8.9 10*3/uL (ref 4.0–10.5)
nRBC: 0 % (ref 0.0–0.2)

## 2020-04-28 LAB — PROCALCITONIN: Procalcitonin: 0.32 ng/mL

## 2020-04-28 LAB — PROTIME-INR
INR: 1.5 — ABNORMAL HIGH (ref 0.8–1.2)
Prothrombin Time: 17.3 seconds — ABNORMAL HIGH (ref 11.4–15.2)

## 2020-04-28 LAB — LACTIC ACID, PLASMA: Lactic Acid, Venous: 1.7 mmol/L (ref 0.5–1.9)

## 2020-04-28 LAB — CORTISOL-AM, BLOOD: Cortisol - AM: 20.6 ug/dL (ref 6.7–22.6)

## 2020-04-28 MED ORDER — AMLODIPINE BESYLATE 10 MG PO TABS
10.0000 mg | ORAL_TABLET | Freq: Every day | ORAL | Status: DC
  Administered 2020-04-28 – 2020-04-30 (×2): 10 mg via ORAL
  Filled 2020-04-28: qty 1
  Filled 2020-04-28: qty 2

## 2020-04-28 MED ORDER — FOLIC ACID 1 MG PO TABS
1.0000 mg | ORAL_TABLET | Freq: Every day | ORAL | Status: DC
  Administered 2020-04-28 – 2020-04-30 (×2): 1 mg via ORAL
  Filled 2020-04-28 (×2): qty 1

## 2020-04-28 MED ORDER — ACETAMINOPHEN 325 MG PO TABS: 650.0000 mg | ORAL_TABLET | Freq: Four times a day (QID) | ORAL | Status: DC | PRN

## 2020-04-28 MED ORDER — CHLORHEXIDINE GLUCONATE CLOTH 2 % EX PADS
6.0000 | MEDICATED_PAD | Freq: Every day | CUTANEOUS | Status: DC
  Administered 2020-04-28 – 2020-05-03 (×4): 6 via TOPICAL

## 2020-04-28 MED ORDER — ASPIRIN EC 81 MG PO TBEC
81.0000 mg | DELAYED_RELEASE_TABLET | Freq: Every day | ORAL | Status: DC
  Administered 2020-04-28 – 2020-04-30 (×2): 81 mg via ORAL
  Filled 2020-04-28 (×2): qty 1

## 2020-04-28 MED ORDER — CLOPIDOGREL BISULFATE 75 MG PO TABS
75.0000 mg | ORAL_TABLET | Freq: Every day | ORAL | Status: DC
  Administered 2020-04-28 – 2020-04-30 (×2): 75 mg via ORAL
  Filled 2020-04-28 (×2): qty 1

## 2020-04-28 NOTE — Plan of Care (Signed)
Patient still NPO to assess swallowing.  SLP order placed but has not been seen yet.

## 2020-04-28 NOTE — Consult Note (Signed)
NEURO HOSPITALIST CONSULT NOTE   Requestig physician: Dr. Louanne Belton  Reason for Consult: AMS  History obtained from:  Chart     HPI:                                                                                                                                          Mariah Thornton is an 74 y.o. female with a PMHx of stage IV renal cell carcinoma, HTN, CVA, chronic anemia, recently hospitalized from 10/13-10/16 with excessive somnolence and seen by Neurology at that time with diagnosis of AMS of multifactorial etiology including small infarcts on MRI brain in conjunction with pain medications and dehydration from poor oral intake, who represents to the ED today after she endorsed having hallucinations consisting of seeing animals around her at her Oncology clinic appointment today. On presentation to [facility], the patient denied headache, visual disturbance or one-sided weakness, numbness or tingling. Abnormal labs included elevated lactic acid of 2.2 and sodium of 128. The patient has been started on IV fluids and Rocephin for possible sepsis secondary to UTI.  On bedside evaluation, the patient is gregarious and high-spirited, but disoriented and actively having hallucinations of cats.   History reviewed. No pertinent past medical history.  Past Surgical History:  Procedure Laterality Date  . PORTA CATH INSERTION N/A 03/28/2020   Procedure: PORTA CATH INSERTION;  Surgeon: Algernon Huxley, MD;  Location: [facility];  Service: Cardiovascular;  Laterality: N/A;  . TEE WITHOUT CARDIOVERSION N/A 04/22/2020   Procedure: TRANSESOPHAGEAL ECHOCARDIOGRAM (TEE);  Surgeon: Minna Merritts, MD;  Location: [facility];  Service: Cardiovascular;  Laterality: N/A;    History reviewed. No pertinent family history.            Social History:  reports that she has never smoked. She has never used smokeless tobacco. She reports current alcohol use of about 1.0 standard drink of alcohol  per week. She reports previous drug use.  No Known Allergies  MEDICATIONS:                                                                                                                       Medications Prior to Admission  Medication Sig Dispense Refill Last Dose  . amLODipine (NORVASC) 10 MG  tablet Take 1 tablet (10 mg total) by mouth daily. 30 tablet 0   . aspirin EC 81 MG EC tablet Take 1 tablet (81 mg total) by mouth daily. Swallow whole. 30 tablet 2   . clopidogrel (PLAVIX) 75 MG tablet Take 1 tablet (75 mg total) by mouth daily for 21 days. 21 tablet 0   . folic acid (FOLVITE) 1 MG tablet Take 1 mg by mouth daily.     Marland Kitchen acetaminophen (TYLENOL) 325 MG tablet Take 2 tablets (650 mg total) by mouth every 6 (six) hours as needed for mild pain (or Fever >/= 101). (Patient not taking: Reported on 04/26/2020)   prn at prn  . axitinib (INLYTA) 5 MG tablet Take 1 tablet (5 mg total) by mouth 2 (two) times daily. (Patient not taking: Reported on 04/21/2020) 60 tablet 0 Not Taking at Unknown time  . HYDROcodone-acetaminophen (NORCO/VICODIN) 5-325 MG tablet Take 1/2 or 1 pill every 6 hours as needed for pain. (Patient not taking: Reported on 04/21/2020) 30 tablet 0 Not Taking at Unknown time  . lidocaine-prilocaine (EMLA) cream Apply to affected area once (Patient taking differently: Apply 1 application topically daily as needed. Apply to affected area once) 30 g 3 prn at prn  . ondansetron (ZOFRAN) 8 MG tablet Take 1 tablet (8 mg total) by mouth 2 (two) times daily as needed (Nausea or vomiting). (Patient not taking: Reported on 04/04/2020) 30 tablet 1 Not Taking at Unknown time   Scheduled: . amLODipine  10 mg Oral Daily  . aspirin EC  81 mg Oral Daily  . Chlorhexidine Gluconate Cloth  6 each Topical Daily  . clopidogrel  75 mg Oral Daily  . enoxaparin (LOVENOX) injection  40 mg Subcutaneous Q24H  . folic acid  1 mg Oral Daily   Continuous: . sodium chloride 100 mL/hr at 04/28/20 1705  .  cefTRIAXone (ROCEPHIN)  IV 1 g (04/28/20 1802)     ROS:                                                                                                                                       Unable to obtain due to AMS.    Blood pressure (!) 137/58, pulse 84, temperature 97.7 F (36.5 C), temperature source Oral, resp. rate 20, SpO2 95 %.   General Examination:  Physical Exam  HEENT-  [place]/AT   Lungs- Respirations unlabored Extremities- No edema   Neurological Examination Mental Status: Awake with good eye contact. Decreased attention. Affect is somewhat manic. The patient has fluent speech but is not oriented to place or situation. She states correctly that it is October but is not oriented to the year or day of the week. Actively hallucinating intermittently that there is a cat at the doorway to her room; she states that she has seen several cats. She states that they are furry and have nice eyes, but she prefers dogs (she is not hallucinating dogs). She endorses an auditory component with occasional loud meowing. The cat hallucinations are not threatening or scary.  Cranial Nerves: II: Does not cooperate with testing of pupillary light reflex. Blinks to threat in the temporal visual fields of both eyes.  III,IV, VI: No ptosis. Horizontal EOM are intact. V,VII: Smile symmetric, facial temp sensation equal bilaterally VIII: hearing intact to voice IX,X: No hypophonia XI: Grossly symmetric XII: midline tongue extension Motor: Right : Upper extremity   5/5    Left:     Upper extremity   5/5  Lower extremity   5/5     Lower extremity   5/5 Tone and bulk:normal tone throughout; no atrophy noted Sensory: Temp and light touch intact throughout, bilaterally Deep Tendon Reflexes: 2+ and symmetric throughout Cerebellar: No ataxia with finger to nose bilaterally  Gait: Deferred   Lab  Results: Basic Metabolic Panel: Recent Labs  Lab 04/23/20 1027 04/23/20 1027 04/26/20 0914 04/26/20 0914 04/26/20 1115 04/27/20 1315 04/28/20 0450  NA 134*  --  126*  --  126* 128* 132*  K 4.3  --  4.7  --  4.6 4.8 4.6  CL 105  --  99  --  98 101 103  CO2 20*  --  18*  --  19* 17* 19*  GLUCOSE 101*  --  153*  --  98 90 81  BUN 19  --  17  --  _0 CREATININE 0.86  --  0.93  --  0.90 0.78 0.71  CALCIUM 7.7*   < > 7.3*   < > 7.2* 7.4* 7.5*   < > = values in this interval not displayed.    CBC: Recent Labs  Lab 04/26/20 1115 04/27/20 0923 04/27/20 1315 04/28/20 0450  WBC 11.1* 12.7* 10.3 8.9  NEUTROABS 8.4* 10.0*  --   --   HGB 9.5* 9.0* 8.6* 8.2*  HCT 31.6* 29.5* 27.6* 27.3*  MCV 83.6 83.1 81.9 82.7  PLT 231 249 220 216    Cardiac Enzymes: No results for input(s): CKTOTAL, CKMB, CKMBINDEX, TROPONINI in the last 168 hours.  Lipid Panel: Recent Labs  Lab 04/23/20 0319  CHOL 126  TRIG 151*  HDL 13*  CHOLHDL 9.7  VLDL 30  LDLCALC 83    Imaging: DG Chest 2 View  Result Date: 04/27/2020 CLINICAL DATA:  Altered mental status EXAM: CHEST - 2 VIEW COMPARISON:  04/20/2020, 03/03/2020 FINDINGS: Right-sided Port-A-Cath remains in place. Stable heart size. Atherosclerotic calcification of the aortic knob. Scattered nodular densities most prevalent within the left lung base, better characterized on recent CT. Small bilateral pleural effusions with associated streaky bibasilar opacities, likely atelectasis. No pneumothorax. IMPRESSION: Small bilateral pleural effusions with associated streaky bibasilar opacities, likely atelectasis. Electronically Signed   By: Davina Poke D.O.   On: 04/27/2020 16:49   CT Head Wo Contrast  Result Date: 04/27/2020 CLINICAL DATA:  Mental status change, unknown cause. EXAM: CT HEAD WITHOUT CONTRAST TECHNIQUE: Contiguous axial images were obtained from the base of the skull through the vertex without intravenous contrast. COMPARISON:   MRI 04/21/2020, CT head 04/20/2020. FINDINGS: Brain: No evidence of acute large vascular territory infarction, hemorrhage, hydrocephalus, extra-axial collection or mass lesion/mass effect. Patchy white matter hypoattenuation appears similar to prior and most likely represents chronic microvascular ischemic disease. Vascular: Calcific atherosclerosis. Skull: Similar left frontal scalp lesion near the vertex and smaller nodule at the midline anteriorly. No acute fracture. Sinuses/Orbits: Clear sinuses.  Unremarkable orbits. Other: No mastoid effusions. IMPRESSION: 1. No evidence of acute intracranial abnormality. 2. 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 3. Similar left frontal scalp lesion near the vertex and smaller nodule at the midline anteriorly. Electronically Signed   By: Margaretha Sheffield MD   On: 04/27/2020 13:59    Assessment: 74 year old female with a PMHx of stage IV renal cell carcinoma, HTN, CVA, chronic anemia, recently hospitalized from 10/13-10/16 with excessive somnolence and seen by Neurology at that time with diagnosis of multifactorial etiology including small infarcts on MRI brain in conjunction with pain medications and dehydration from poor oral intake who represents to the ED today after she endorsed having hallucinations consisting of seeing animals around her at her Oncology clinic appointment today. On presentation to [facility], the patient denied headache, visual disturbance or one-sided weakness, numbness or tingling. Abnormal labs included elevated lactic acid of 2.2 and sodium of 128. The patient has been started on IV fluids and Rocephin for possible sepsis secondary to UTI. 1. Neurological exam reveals a delirious patient who is having formed visual hallucinations of cats. No focal weakness, facial droop or sensory loss is noted. Although confused and disoriented, she is not aphasic.  2. CT head: . No evidence of acute intracranial abnormality. Chronic  microvascular ischemic disease and generalized atrophy. Similar left frontal scalp lesion near the vertex and smaller nodule at the midline anteriorly. 3. Recent B12 level was elevated in August.  4. TSH elevated on 10/16, but free T4 was normal 5. AST, ALT and ammonia levels are normal.  6. Corrected Ca level is normal at 9.2 7. Encephalopathy is most likely multifactorial. See below for recommendations.  8. EEG on 10/15 showed frontal intermittent rhythmic delta slowing (FIRDA) which is etiologically nonspecific but likely was secondary to a metabolic encephalopathy.   Recommendations: 1. Gradually correct her Na.  2. Obtain a thiamine level.  3. Repeat MRI brain to assess for possible recurrent strokes since her MRI on 10/14 4. RPR 5. ESR and c-reactive protein 6. Limit medications with anticholinergic side effects 7. Agree with holding her Norco.  8. Continue the Plavix and ASA x 3 weeks then discontinue Plavix while continuing ASA as per Neurology follow up note on 10/16 from her last admission.  9. TEE last admission showed no thrombus in the left atrium or left atrial appendage. No need to repeat cardiac ultrasound this admission.   Electronically signed: Dr. Kerney Elbe 04/28/2020, 6:51 PM

## 2020-04-28 NOTE — Plan of Care (Signed)

## 2020-04-28 NOTE — TOC Initial Note (Signed)
Transition of Care [facility]) - Initial/Assessment Note    Patient Details  Name: Mariah Thornton MRN: [id number] Date of Birth: [birth year]  Transition of Care [facility]) CM/SW Contact:    Ova Freshwater Phone Number: [phone] 04/28/2020, 2:14 PM  Clinical Narrative:                  Patient presents to [facility] ED due to altered mental state.  CSW spoke with Reitz, [name] (Spouse) [phone], who was at the patient's bedside.  Mr. Milhouse is hard of hearing and had difficulty understanding what I was asking.  Mr. Molzahn asked this CSW to contact [name] [phone] for collateral information.  Mr. Cordner stated Levada Dy takes care of assisting the patient with ADLs, including going to doctor's appointment.  CSW contacted Ms. Micheal Likens and left a voicemail requesting a call back. Expected Discharge Plan: Skilled Nursing Facility Barriers to Discharge: Continued Medical Work up, ED SNF auth   Patient Goals and CMS Choice   CMS Medicare.gov Compare Post Acute Care list provided to:: Other (Comment Required) (Blankenship, [name] (Spouse) [phone]) Choice offered to / list presented to : Spouse  Expected Discharge Plan and Services Expected Discharge Plan: [place] In-house Referral: Clinical Social Work   Post Acute Care Choice: [place] Living arrangements for the past 2 months: [place]                                      Prior Living Arrangements/Services Living arrangements for the past 2 months: Single Family Home Lives with:: Spouse Patient language and need for interpreter reviewed:: Yes Do you feel safe going back to the place where you live?: Yes      Need for Family Participation in Patient Care: Yes (Comment) Care giver support system in place?: Yes (comment)   Criminal Activity/Legal Involvement Pertinent to Current Situation/Hospitalization: No - Comment as needed  Activities of Daily Living      Permission  Sought/Granted      Share Information with NAMEZYION, LEIDNER (Spouse) [phone]        Permission granted to share info w Contact Information: Levada Dy [phone] (family friend)  Emotional Assessment Appearance:: Appears stated age Attitude/Demeanor/Rapport: Unable to Assess (Altered mental state) Affect (typically observed): Unable to Assess (Altered mental state) Orientation: : Fluctuating Orientation (Suspected and/or reported Sundowners) Alcohol / Substance Use: Not Applicable Psych Involvement: No (comment)  Admission diagnosis:  Hallucinations [D78.2] Metabolic encephalopathy [U23.53] Patient Active Problem List   Diagnosis Date Noted  . Metabolic encephalopathy 61/44/3154  . Hallucinations 04/27/2020  . UTI (urinary tract infection) 04/27/2020  . Sepsis ([place]) 04/27/2020  . Metastatic renal cell carcinoma ([place]) 04/27/2020  . Hypotension 04/27/2020  . Cerebral infarction due to embolism of cerebral artery ([place]) 04/26/2020  . Fatigue   . Palliative care encounter   . Somnolence 04/21/2020  . Low back pain 04/21/2020  . Hypertension 04/20/2020  . Hyperkalemia 04/20/2020  . Bilateral lower extremity edema 04/20/2020  . Bone metastases ([place]) 04/10/2020  . Cancer-related pain 04/10/2020  . Encounter for antineoplastic immunotherapy 04/07/2020  . Goals of care, counseling/discussion 03/22/2020  . Renal cell carcinoma of right kidney ([place]) 03/16/2020  . Normocytic anemia 03/16/2020  . Folate deficiency 03/16/2020  . Hypercalcemia 03/03/2020   PCP:  Philmore Pali, NP Pharmacy:   [place], [place] [phone]  [place] [address] Phone: [phone] Fax: [phone]  [place] [place] [address] [place] [address] Phone: [phone] Fax: [phone]     Social Determinants of Health (SDOH) Interventions    Readmission Risk  Interventions No flowsheet data found.

## 2020-04-28 NOTE — Progress Notes (Addendum)
PROGRESS NOTE  Mariah Thornton QMV:[id number] DOB: 01/26/1946 DOA: 04/27/2020 PCP: Philmore Pali, NP   LOS: 0 days   Brief narrative: As per HPI,  Mariah Thornton is a 74 y.o. female with medical history significant for stage IV renal cell carcinoma (followed by Dr. Mike Gip) s/p1 cycle of Keytruda on 04/07/2020 and onoral Inlyta as well as history of HTN, CVA, chronic anemia, recently hospitalized from 10/13-10/16 with excessive somnolence, multifactorial etiology including small infarcts seen on MRI brain, pain medications and dehydration from poor oral intake who was sent by the oncology clinic for admission due to complaints of hallucinations consisting of seeing animals around her.  Patient denied headache, visual disturbance or one-sided weakness numbness or tingling.  ED Course: On arrival, vitals were within normal limits with somewhat soft blood pressure of 117/51.  Blood work mostly notable for hemoglobin of 8.6, which is about her baseline, normal WBC of 10.3 but with elevated lactic acid of 2.2, sodium 128.  Urinalysis showed moderate leukocyte esterase. EKG  Showed  Normal sinus rhythm with no acute ST-T wave changes. Patient was started on IV fluids, Rocephin.  Hospitalist consulted for admission for possible sepsis secondary to UTI with hallucinations and agitation.  Assessment/Plan:  Principal Problem:   Hallucinations Active Problems:   Bone metastases (HCC)   Hypertension   UTI (urinary tract infection)   Metastatic renal cell carcinoma (HCC)   Hypotension  Ongoing hallucinations, agitation and restlessness suspect acute metabolic encephalopathy with delirium  Could be related to underlying UTI. Had frontal scalp nodule but no acute finding in the brain parenchyma. Continue fall precautions, aspiration precautions. Treat for UTI. If not improved, could consider MRI of the brain. Random cortisol was 20.0. Procalcitonin 0.3. WBC 8.9. Sodium mildly low at 132. Blood cultures  negative in less than 12 hours. COVID-19 and flu was negative. Urine drug screen was negative. Ammonia was 24. Liver function test within normal limits. Serum calcium on the lower side. Urinalysis was mildly abnormal, urine culture pending. Patient has not been on cancer medication or pain medications for the last week or so and was not on chronic pain medications.  6 presents well   Volume depletion. Continue with IV fluid hydration.    Urine tract infection   No leukocytosis or fever but lactate was elevated. Responded to IV fluids. Continue IV Rocephin. Follow blood cultures. Blood cultures negative in less than 12 hours. Lactate was 1.7 after fluid resuscitation from 2.4.  Recent embolic CVA Continue aspirin and Plavix. Patient is not on statins. Resume aspirin and Plavix.  CT head scan was negative for bleeding.  Communicated with oncology and neurology team  Folic acid deficiency. Continue folate.  Metastatic renal cell carcinoma (HCC) to bone and lung Patient with confusion, disorientation agitation and hallucination at this time. Might consider MRI of the brain if not improved with treatment of UTI. Informed oncology about the patient. Patient has received [facility] as outpatient.   DVT prophylaxis: enoxaparin (LOVENOX) injection 40 mg Start: 04/27/20 2000   Code Status: Partial code, no cpr  Family Communication: I had a prolonged discussion with the patient's husband at bedside. Spoke with Ms. Levada Dy [phone], the patient's cousin on the phone and updated her about the clinical condition of the patient.   Status is: Observation  The patient will require care spanning > 2 midnights and should be moved to inpatient because: Unsafe d/c plan, IV treatments appropriate due to intensity of illness or inability to take PO, Inpatient  level of care appropriate due to severity of illness and Ongoing hallucinations, agitation and delirium  Dispo: The patient is from: Home               Anticipated d/c is to: Home              Anticipated d/c date is: 3 days              Patient currently is not medically stable to d/c.  Consultants: Oncology   Procedures:  None  Antibiotics:  . Rocephin IV 10/20>  Anti-infectives (From admission, onward)   Start     Dose/Rate Route Frequency Ordered Stop   04/28/20 1800  cefTRIAXone (ROCEPHIN) 1 g in sodium chloride 0.9 % 100 mL IVPB        1 g 200 mL/hr over 30 Minutes Intravenous Every 24 hours 04/27/20 1956     04/27/20 1815  cefTRIAXone (ROCEPHIN) 1 g in sodium chloride 0.9 % 100 mL IVPB        1 g 200 mL/hr over 30 Minutes Intravenous  Once 04/27/20 1802 04/27/20 1917     Subjective: Today, patient was seen and examined at bedside. Patient is extremely agitated, restless, confused and hallucinating. Constantly fidgeting in bed. Husband at bedsidewas tearful about the whole situation.  Objective: Vitals:   04/28/20 0453 04/28/20 0652  BP: 108/79 128/65  Pulse: 92 86  Resp: (!) 22 15  Temp:    SpO2: 96% 94%   No intake or output data in the 24 hours ending 04/28/20 1241 There were no vitals filed for this visit. There is no height or weight on file to calculate BMI.   Physical Exam:  GENERAL: Patient is alert awake and communicative but disoriented confused agitated at times, constantly fidgety in bed.  HENT: No scleral pallor or icterus. Pupils equally reactive to light. Oral mucosa is moist NECK: is supple, no gross swelling noted. CHEST: Clear to auscultation. No crackles or wheezes.  Diminished breath sounds bilaterally. CVS: S1 and S2 heard, no murmur. Regular rate and rhythm.  ABDOMEN: Soft, non-tender, bowel sounds are present. EXTREMITIES: Bilateral lower extremity trace edema noted CNS: Moving all extremities, disoriented confused. SKIN: warm and dry without rashes.  Data Review: I have personally reviewed the following laboratory data and studies,  CBC: Recent Labs  Lab 04/26/20 1115  04/27/20 0923 04/27/20 1315 04/28/20 0450  WBC 11.1* 12.7* 10.3 8.9  NEUTROABS 8.4* 10.0*  --   --   HGB 9.5* 9.0* 8.6* 8.2*  HCT 31.6* 29.5* 27.6* 27.3*  MCV 83.6 83.1 81.9 82.7  PLT 231 249 220 450   Basic Metabolic Panel: Recent Labs  Lab 04/23/20 1027 04/26/20 0914 04/26/20 1115 04/27/20 1315 04/28/20 0450  NA 134* 126* 126* 128* 132*  K 4.3 4.7 4.6 4.8 4.6  CL 105 99 98 101 103  CO2 20* 18* 19* 17* 19*  GLUCOSE 101* 153* 98 90 81  BUN 19 17 18 19 17   CREATININE 0.86 0.93 0.90 0.78 0.71  CALCIUM 7.7* 7.3* 7.2* 7.4* 7.5*   Liver Function Tests: Recent Labs  Lab 04/26/20 1115 04/27/20 1315  AST 40 38  ALT 14 14  ALKPHOS 91 88  BILITOT 0.6 0.8  PROT 6.6 6.5  ALBUMIN 2.0* 2.1*   No results for input(s): LIPASE, AMYLASE in the last 168 hours. Recent Labs  Lab 04/27/20 1329  AMMONIA 24   Cardiac Enzymes: No results for input(s): CKTOTAL, CKMB, CKMBINDEX, TROPONINI in the last 168  hours. BNP (last 3 results) Recent Labs    04/27/20 1844  BNP 76.6    ProBNP (last 3 results) No results for input(s): PROBNP in the last 8760 hours.  CBG: No results for input(s): GLUCAP in the last 168 hours. Recent Results (from the past 240 hour(s))  Respiratory Panel by RT PCR (Flu A&B, Covid) - Nasopharyngeal Swab     Status: None   Collection Time: 04/21/20  7:40 AM   Specimen: Nasopharyngeal Swab  Result Value Ref Range Status   SARS Coronavirus 2 by RT PCR NEGATIVE NEGATIVE Final    Comment: (NOTE) SARS-CoV-2 target nucleic acids are NOT DETECTED.  The SARS-CoV-2 RNA is generally detectable in upper respiratoy specimens during the acute phase of infection. The lowest concentration of SARS-CoV-2 viral copies this assay can detect is 131 copies/mL. A negative result does not preclude SARS-Cov-2 infection and should not be used as the sole basis for treatment or other patient management decisions. A negative result may occur with  improper specimen  collection/handling, submission of specimen other than nasopharyngeal swab, presence of viral mutation(s) within the areas targeted by this assay, and inadequate number of viral copies (<131 copies/mL). A negative result must be combined with clinical observations, patient history, and epidemiological information. The expected result is Negative.  Fact Sheet for Patients:  PinkCheek.be  Fact Sheet for Healthcare Providers:  GravelBags.it  This test is no t yet approved or cleared by the [place] FDA and  has been authorized for detection and/or diagnosis of SARS-CoV-2 by FDA under an Emergency Use Authorization (EUA). This EUA will remain  in effect (meaning this test can be used) for the duration of the COVID-19 declaration under Section 564(b)(1) of the Act, 21 U.S.C. section 360bbb-3(b)(1), unless the authorization is terminated or revoked sooner.     Influenza A by PCR NEGATIVE NEGATIVE Final   Influenza B by PCR NEGATIVE NEGATIVE Final    Comment: (NOTE) The Xpert Xpress SARS-CoV-2/FLU/RSV assay is intended as an aid in  the diagnosis of influenza from Nasopharyngeal swab specimens and  should not be used as a sole basis for treatment. Nasal washings and  aspirates are unacceptable for Xpert Xpress SARS-CoV-2/FLU/RSV  testing.  Fact Sheet for Patients: PinkCheek.be  Fact Sheet for Healthcare Providers: GravelBags.it  This test is not yet approved or cleared by the [place] FDA and  has been authorized for detection and/or diagnosis of SARS-CoV-2 by  FDA under an Emergency Use Authorization (EUA). This EUA will remain  in effect (meaning this test can be used) for the duration of the  Covid-19 declaration under Section 564(b)(1) of the Act, 21  U.S.C. section 360bbb-3(b)(1), unless the authorization is  terminated or revoked. Performed at [facility], [address] [place], [place] [address]   Blood culture (routine x 2)     Status: None (Preliminary result)   Collection Time: 04/27/20  6:44 PM   Specimen: BLOOD  Result Value Ref Range Status   Specimen Description BLOOD PORTA CATH  Final   Special Requests   Final    BOTTLES DRAWN AEROBIC AND ANAEROBIC Blood Culture results may not be optimal due to an inadequate volume of blood received in culture bottles   Culture   Final    NO GROWTH < 12 HOURS Performed at [facility], [address], [place], [place] [address]    Report Status PENDING  Incomplete  Blood culture (routine x 2)     Status: None (  Preliminary result)   Collection Time: 04/27/20  6:44 PM   Specimen: BLOOD  Result Value Ref Range Status   Specimen Description BLOOD RIGHT ANTECUBITAL  Final   Special Requests   Final    BOTTLES DRAWN AEROBIC AND ANAEROBIC Blood Culture results may not be optimal due to an inadequate volume of blood received in culture bottles   Culture   Final    NO GROWTH < 12 HOURS Performed at [facility], [address], [place], [place] [address]    Report Status PENDING  Incomplete  Respiratory Panel by RT PCR (Flu A&B, Covid) - Nasopharyngeal Swab     Status: None   Collection Time: 04/27/20  6:57 PM   Specimen: Nasopharyngeal Swab  Result Value Ref Range Status   SARS Coronavirus 2 by RT PCR NEGATIVE NEGATIVE Final    Comment: (NOTE) SARS-CoV-2 target nucleic acids are NOT DETECTED.  The SARS-CoV-2 RNA is generally detectable in upper respiratoy specimens during the acute phase of infection. The lowest concentration of SARS-CoV-2 viral copies this assay can detect is 131 copies/mL. A negative result does not preclude SARS-Cov-2 infection and should not be used as the sole basis for treatment or other patient management decisions. A negative result may occur with  improper specimen collection/handling, submission of specimen other than  nasopharyngeal swab, presence of viral mutation(s) within the areas targeted by this assay, and inadequate number of viral copies (<131 copies/mL). A negative result must be combined with clinical observations, patient history, and epidemiological information. The expected result is Negative.  Fact Sheet for Patients:  PinkCheek.be  Fact Sheet for Healthcare Providers:  GravelBags.it  This test is no t yet approved or cleared by the [place] FDA and  has been authorized for detection and/or diagnosis of SARS-CoV-2 by FDA under an Emergency Use Authorization (EUA). This EUA will remain  in effect (meaning this test can be used) for the duration of the COVID-19 declaration under Section 564(b)(1) of the Act, 21 U.S.C. section 360bbb-3(b)(1), unless the authorization is terminated or revoked sooner.     Influenza A by PCR NEGATIVE NEGATIVE Final   Influenza B by PCR NEGATIVE NEGATIVE Final    Comment: (NOTE) The Xpert Xpress SARS-CoV-2/FLU/RSV assay is intended as an aid in  the diagnosis of influenza from Nasopharyngeal swab specimens and  should not be used as a sole basis for treatment. Nasal washings and  aspirates are unacceptable for Xpert Xpress SARS-CoV-2/FLU/RSV  testing.  Fact Sheet for Patients: PinkCheek.be  Fact Sheet for Healthcare Providers: GravelBags.it  This test is not yet approved or cleared by the [place] FDA and  has been authorized for detection and/or diagnosis of SARS-CoV-2 by  FDA under an Emergency Use Authorization (EUA). This EUA will remain  in effect (meaning this test can be used) for the duration of the  Covid-19 declaration under Section 564(b)(1) of the Act, 21  U.S.C. section 360bbb-3(b)(1), unless the authorization is  terminated or revoked. Performed at [facility], [address] [place], [place]  [address]      Studies: DG Chest 2 View  Result Date: 04/27/2020 CLINICAL DATA:  Altered mental status EXAM: CHEST - 2 VIEW COMPARISON:  04/20/2020, 03/03/2020 FINDINGS: Right-sided Port-A-Cath remains in place. Stable heart size. Atherosclerotic calcification of the aortic knob. Scattered nodular densities most prevalent within the left lung base, better characterized on recent CT. Small bilateral pleural effusions with associated streaky bibasilar opacities, likely atelectasis. No pneumothorax. IMPRESSION: Small bilateral pleural  effusions with associated streaky bibasilar opacities, likely atelectasis. Electronically Signed   By: Davina Poke D.O.   On: 04/27/2020 16:49   CT Head Wo Contrast  Result Date: 04/27/2020 CLINICAL DATA:  Mental status change, unknown cause. EXAM: CT HEAD WITHOUT CONTRAST TECHNIQUE: Contiguous axial images were obtained from the base of the skull through the vertex without intravenous contrast. COMPARISON:  MRI 04/21/2020, CT head 04/20/2020. FINDINGS: Brain: No evidence of acute large vascular territory infarction, hemorrhage, hydrocephalus, extra-axial collection or mass lesion/mass effect. Patchy white matter hypoattenuation appears similar to prior and most likely represents chronic microvascular ischemic disease. Vascular: Calcific atherosclerosis. Skull: Similar left frontal scalp lesion near the vertex and smaller nodule at the midline anteriorly. No acute fracture. Sinuses/Orbits: Clear sinuses.  Unremarkable orbits. Other: No mastoid effusions. IMPRESSION: 1. No evidence of acute intracranial abnormality. 2. 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 3. Similar left frontal scalp lesion near the vertex and smaller nodule at the midline anteriorly. Electronically Signed   By: Margaretha Sheffield MD   On: 04/27/2020 13:59      Flora Lipps, MD  [facility] 04/28/2020

## 2020-04-28 NOTE — Progress Notes (Signed)
SLP Cancellation Note  Patient Details Name: Mariah Thornton MRN: [id number] DOB: [birth year]   Cancelled treatment:       Reason Eval/Treat Not Completed: Patient not medically ready;Medical issues which prohibited therapy (chart reviewed). Per chart notes, pt is extremely agitated, restless, confused and hallucinating. Constantly fidgeting in bed. Will hold on the BSE until pt can be more settled to safely participate in po trials/BSE. Recommend alternative means for medications; frequent oral care for hygiene and stimulation of swallowing.     Orinda Kenner, MS, CCC-SLP Speech Language Pathologist [facility] [phone] [facility] 04/28/2020, 2:19 PM

## 2020-04-28 NOTE — Telephone Encounter (Signed)
I called Mariah Thornton (relatice) with appt for [facility] scheduled 10/26. She is admitted. Mariah Thornton is not sure that she will be able to come on Tuesday but agreed to call me Monday to discuss!!!

## 2020-04-28 NOTE — Plan of Care (Addendum)
Patient report given to Leslye Peer, RN on 1C.  Going to room 129.  Informed of need for sitter.

## 2020-04-28 NOTE — Progress Notes (Deleted)
PROGRESS NOTE  Mariah Thornton OZH:[id number] DOB: 09-07-1945 DOA: 04/27/2020 PCP: Philmore Pali, NP   LOS: 0 days   Brief narrative: As per HPI,  Mariah Thornton is a 74 y.o. female with medical history significant for stage IV renal cell carcinoma (followed by Dr. Mike Gip) s/p1 cycle of Keytruda on 04/07/2020 and onoral Inlyta as well as history of HTN, CVA, chronic anemia, recently hospitalized from 10/13-10/16 with excessive somnolence, multifactorial etiology including small infarcts seen on MRI brain, pain medications and dehydration from poor oral intake who was sent by the oncology clinic for admission due to complaints of hallucinations consisting of seeing animals around her.  Patient denied headache, visual disturbance or one-sided weakness numbness or tingling.  ED Course: On arrival, vitals were within normal limits with somewhat soft blood pressure of 117/51.  Blood work mostly notable for hemoglobin of 8.6, which is about her baseline, normal WBC of 10.3 but with elevated lactic acid of 2.2, sodium 128.  Urinalysis showed moderate leukocyte esterase. EKG  Showed  Normal sinus rhythm with no acute ST-T wave changes. Patient was started on IV fluids, Rocephin.  Hospitalist consulted for admission for possible sepsis secondary to UTI with hallucinations and agitation.  Assessment/Plan:  Principal Problem:   Hallucinations Active Problems:   UTI (urinary tract infection)   Sepsis (HCC)   Metastatic renal cell carcinoma (HCC)   Hypotension  Ongoing hallucinations, agitation and restlessness suspect acute metabolic encephalopathy with delirium Could be related to underlying UTI. He did sort of frontal scalp nodule but no acute finding in the brain parenchyma. Continue fall precautions, aspiration precautions. Treat for UTI. If not improved could consider MRI of the brain. Random cortisol was 20.0. Procalcitonin 0.3. WBC 8.9. Sodium mildly low at 132. Blood cultures negative in less than  12 hours. COVID-19 and flu was negative. Urine drug screen was negative. Ammonia was 24. Liver function test within normal limits. Serum calcium on the lower side. Urinalysis was mildly abnormal urine culture pending. Patient has not been on cancer medication or pain medications and was not on chronic pain medications  Volume depletion. Continue with IV fluid hydration.    Urine tract infection  No leukocytosis or fever but lactate was elevated. Responded to IV fluids. Continue IV Rocephin. Follow blood cultures. Blood cultures negative in less than 12 hours. Lactate was 1.7 after fluid resuscitation from 2.4.  Recent embolic CVA Continue aspirin and Plavix. Patient is not on statins. Resume aspirin and Plavix.  Folic acid deficiency. Continue folate.  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 Might consider MRI of the brain if not improved with treatment of UTI. Will inform oncology about the patient. Patient has received [facility] as outpatient.   DVT prophylaxis: enoxaparin (LOVENOX) injection 40 mg Start: 04/27/20 2000   Code Status: Partial code, no cpr  Family Communication: I had a prolonged discussion with the patient's husband at bedside. Spoke with Ms Mariah Thornton [phone], the patient's cousin on the phone and updated her about the clinical condition of the patient.   Status is: Observation  The patient will require care spanning > 2 midnights and should be moved to inpatient because: Unsafe d/c plan, IV treatments appropriate due to intensity of illness or inability to take PO, Inpatient level of care appropriate due to severity of illness and Ongoing hallucinations, agitation and delirium  Dispo: The patient is from: Home  Anticipated d/c is to: Home              Anticipated d/c date is: 3 days              Patient currently is not medically stable to  d/c.  Consultants:  None  Procedures:  None  Antibiotics:  . Rocephin IV 10/20>  Anti-infectives (From admission, onward)   Start     Dose/Rate Route Frequency Ordered Stop   04/28/20 1800  cefTRIAXone (ROCEPHIN) 1 g in sodium chloride 0.9 % 100 mL IVPB        1 g 200 mL/hr over 30 Minutes Intravenous Every 24 hours 04/27/20 1956     04/27/20 1815  cefTRIAXone (ROCEPHIN) 1 g in sodium chloride 0.9 % 100 mL IVPB        1 g 200 mL/hr over 30 Minutes Intravenous  Once 04/27/20 1802 04/27/20 1917     Subjective: Today, patient was seen and examined at bedside. Patient is extremely agitated, restless, confused and hallucinating. Constantly fidgeting in bed. Husband at bedsidewas tearful about the whole situation.  Objective: Vitals:   04/28/20 0453 04/28/20 0652  BP: 108/79 128/65  Pulse: 92 86  Resp: (!) 22 15  Temp:    SpO2: 96% 94%   No intake or output data in the 24 hours ending 04/28/20 1112 There were no vitals filed for this visit. There is no height or weight on file to calculate BMI.   Physical Exam:  GENERAL: Patient is alert awake and communicative but disoriented confused agitated at times, constantly fidgety in bed.  HENT: No scleral pallor or icterus. Pupils equally reactive to light. Oral mucosa is moist NECK: is supple, no gross swelling noted. CHEST: Clear to auscultation. No crackles or wheezes.  Diminished breath sounds bilaterally. CVS: S1 and S2 heard, no murmur. Regular rate and rhythm.  ABDOMEN: Soft, non-tender, bowel sounds are present. EXTREMITIES: Bilateral lower extremity trace edema noted CNS: Moving all extremities, disoriented confused. SKIN: warm and dry without rashes.  Data Review: I have personally reviewed the following laboratory data and studies,  CBC: Recent Labs  Lab 04/26/20 1115 04/27/20 0923 04/27/20 1315 04/28/20 0450  WBC 11.1* 12.7* 10.3 8.9  NEUTROABS 8.4* 10.0*  --   --   HGB 9.5* 9.0* 8.6* 8.2*  HCT 31.6*  29.5* 27.6* 27.3*  MCV 83.6 83.1 81.9 82.7  PLT 231 249 220 662   Basic Metabolic Panel: Recent Labs  Lab 04/23/20 1027 04/26/20 0914 04/26/20 1115 04/27/20 1315 04/28/20 0450  NA 134* 126* 126* 128* 132*  K 4.3 4.7 4.6 4.8 4.6  CL 105 99 98 101 103  CO2 20* 18* 19* 17* 19*  GLUCOSE 101* 153* 98 90 81  BUN 19 17 18 19 17   CREATININE 0.86 0.93 0.90 0.78 0.71  CALCIUM 7.7* 7.3* 7.2* 7.4* 7.5*   Liver Function Tests: Recent Labs  Lab 04/26/20 1115 04/27/20 1315  AST 40 38  ALT 14 14  ALKPHOS 91 88  BILITOT 0.6 0.8  PROT 6.6 6.5  ALBUMIN 2.0* 2.1*   No results for input(s): LIPASE, AMYLASE in the last 168 hours. Recent Labs  Lab 04/27/20 1329  AMMONIA 24   Cardiac Enzymes: No results for input(s): CKTOTAL, CKMB, CKMBINDEX, TROPONINI in the last 168 hours. BNP (last 3 results) Recent Labs    04/27/20 1844  BNP 76.6    ProBNP (last 3 results) No results for input(s): PROBNP in the last 8760 hours.  CBG: No  results for input(s): GLUCAP in the last 168 hours. Recent Results (from the past 240 hour(s))  Respiratory Panel by RT PCR (Flu A&B, Covid) - Nasopharyngeal Swab     Status: None   Collection Time: 04/21/20  7:40 AM   Specimen: Nasopharyngeal Swab  Result Value Ref Range Status   SARS Coronavirus 2 by RT PCR NEGATIVE NEGATIVE Final    Comment: (NOTE) SARS-CoV-2 target nucleic acids are NOT DETECTED.  The SARS-CoV-2 RNA is generally detectable in upper respiratoy specimens during the acute phase of infection. The lowest concentration of SARS-CoV-2 viral copies this assay can detect is 131 copies/mL. A negative result does not preclude SARS-Cov-2 infection and should not be used as the sole basis for treatment or other patient management decisions. A negative result may occur with  improper specimen collection/handling, submission of specimen other than nasopharyngeal swab, presence of viral mutation(s) within the areas targeted by this assay, and  inadequate number of viral copies (<131 copies/mL). A negative result must be combined with clinical observations, patient history, and epidemiological information. The expected result is Negative.  Fact Sheet for Patients:  PinkCheek.be  Fact Sheet for Healthcare Providers:  GravelBags.it  This test is no t yet approved or cleared by the [place] FDA and  has been authorized for detection and/or diagnosis of SARS-CoV-2 by FDA under an Emergency Use Authorization (EUA). This EUA will remain  in effect (meaning this test can be used) for the duration of the COVID-19 declaration under Section 564(b)(1) of the Act, 21 U.S.C. section 360bbb-3(b)(1), unless the authorization is terminated or revoked sooner.     Influenza A by PCR NEGATIVE NEGATIVE Final   Influenza B by PCR NEGATIVE NEGATIVE Final    Comment: (NOTE) The Xpert Xpress SARS-CoV-2/FLU/RSV assay is intended as an aid in  the diagnosis of influenza from Nasopharyngeal swab specimens and  should not be used as a sole basis for treatment. Nasal washings and  aspirates are unacceptable for Xpert Xpress SARS-CoV-2/FLU/RSV  testing.  Fact Sheet for Patients: PinkCheek.be  Fact Sheet for Healthcare Providers: GravelBags.it  This test is not yet approved or cleared by the [place] FDA and  has been authorized for detection and/or diagnosis of SARS-CoV-2 by  FDA under an Emergency Use Authorization (EUA). This EUA will remain  in effect (meaning this test can be used) for the duration of the  Covid-19 declaration under Section 564(b)(1) of the Act, 21  U.S.C. section 360bbb-3(b)(1), unless the authorization is  terminated or revoked. Performed at [facility], [address] [place], [place] [address]   Blood culture (routine x 2)     Status: None (Preliminary result)   Collection Time:  04/27/20  6:44 PM   Specimen: BLOOD  Result Value Ref Range Status   Specimen Description BLOOD PORTA CATH  Final   Special Requests   Final    BOTTLES DRAWN AEROBIC AND ANAEROBIC Blood Culture results may not be optimal due to an inadequate volume of blood received in culture bottles   Culture   Final    NO GROWTH < 12 HOURS Performed at [facility], [address] [place], [place] [address]    Report Status PENDING  Incomplete  Blood culture (routine x 2)     Status: None (Preliminary result)   Collection Time: 04/27/20  6:44 PM   Specimen: BLOOD  Result Value Ref Range Status   Specimen Description BLOOD RIGHT ANTECUBITAL  Final   Special Requests  Final    BOTTLES DRAWN AEROBIC AND ANAEROBIC Blood Culture results may not be optimal due to an inadequate volume of blood received in culture bottles   Culture   Final    NO GROWTH < 12 HOURS Performed at [facility], [address] [place], [place] [address]    Report Status PENDING  Incomplete  Respiratory Panel by RT PCR (Flu A&B, Covid) - Nasopharyngeal Swab     Status: None   Collection Time: 04/27/20  6:57 PM   Specimen: Nasopharyngeal Swab  Result Value Ref Range Status   SARS Coronavirus 2 by RT PCR NEGATIVE NEGATIVE Final    Comment: (NOTE) SARS-CoV-2 target nucleic acids are NOT DETECTED.  The SARS-CoV-2 RNA is generally detectable in upper respiratoy specimens during the acute phase of infection. The lowest concentration of SARS-CoV-2 viral copies this assay can detect is 131 copies/mL. A negative result does not preclude SARS-Cov-2 infection and should not be used as the sole basis for treatment or other patient management decisions. A negative result may occur with  improper specimen collection/handling, submission of specimen other than nasopharyngeal swab, presence of viral mutation(s) within the areas targeted by this assay, and inadequate number of viral copies (<131 copies/mL). A  negative result must be combined with clinical observations, patient history, and epidemiological information. The expected result is Negative.  Fact Sheet for Patients:  PinkCheek.be  Fact Sheet for Healthcare Providers:  GravelBags.it  This test is no t yet approved or cleared by the [place] FDA and  has been authorized for detection and/or diagnosis of SARS-CoV-2 by FDA under an Emergency Use Authorization (EUA). This EUA will remain  in effect (meaning this test can be used) for the duration of the COVID-19 declaration under Section 564(b)(1) of the Act, 21 U.S.C. section 360bbb-3(b)(1), unless the authorization is terminated or revoked sooner.     Influenza A by PCR NEGATIVE NEGATIVE Final   Influenza B by PCR NEGATIVE NEGATIVE Final    Comment: (NOTE) The Xpert Xpress SARS-CoV-2/FLU/RSV assay is intended as an aid in  the diagnosis of influenza from Nasopharyngeal swab specimens and  should not be used as a sole basis for treatment. Nasal washings and  aspirates are unacceptable for Xpert Xpress SARS-CoV-2/FLU/RSV  testing.  Fact Sheet for Patients: PinkCheek.be  Fact Sheet for Healthcare Providers: GravelBags.it  This test is not yet approved or cleared by the [place] FDA and  has been authorized for detection and/or diagnosis of SARS-CoV-2 by  FDA under an Emergency Use Authorization (EUA). This EUA will remain  in effect (meaning this test can be used) for the duration of the  Covid-19 declaration under Section 564(b)(1) of the Act, 21  U.S.C. section 360bbb-3(b)(1), unless the authorization is  terminated or revoked. Performed at [facility], [address] [place], [place] [address]      Studies: DG Chest 2 View  Result Date: 04/27/2020 CLINICAL DATA:  Altered mental status EXAM: CHEST - 2 VIEW COMPARISON:  04/20/2020,  03/03/2020 FINDINGS: Right-sided Port-A-Cath remains in place. Stable heart size. Atherosclerotic calcification of the aortic knob. Scattered nodular densities most prevalent within the left lung base, better characterized on recent CT. Small bilateral pleural effusions with associated streaky bibasilar opacities, likely atelectasis. No pneumothorax. IMPRESSION: Small bilateral pleural effusions with associated streaky bibasilar opacities, likely atelectasis. Electronically Signed   By: Davina Poke D.O.   On: 04/27/2020 16:49   CT Head Wo Contrast  Result Date: 04/27/2020 CLINICAL DATA:  Mental status change, unknown cause. EXAM: CT HEAD WITHOUT CONTRAST TECHNIQUE: Contiguous axial images were obtained from the base of the skull through the vertex without intravenous contrast. COMPARISON:  MRI 04/21/2020, CT head 04/20/2020. FINDINGS: Brain: No evidence of acute large vascular territory infarction, hemorrhage, hydrocephalus, extra-axial collection or mass lesion/mass effect. Patchy white matter hypoattenuation appears similar to prior and most likely represents chronic microvascular ischemic disease. Vascular: Calcific atherosclerosis. Skull: Similar left frontal scalp lesion near the vertex and smaller nodule at the midline anteriorly. No acute fracture. Sinuses/Orbits: Clear sinuses.  Unremarkable orbits. Other: No mastoid effusions. IMPRESSION: 1. No evidence of acute intracranial abnormality. 2. 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 3. Similar left frontal scalp lesion near the vertex and smaller nodule at the midline anteriorly. Electronically Signed   By: Margaretha Sheffield MD   On: 04/27/2020 13:59      Flora Lipps, MD  [facility] 04/28/2020

## 2020-04-29 ENCOUNTER — Inpatient Hospital Stay: Payer: Medicare Other

## 2020-04-29 DIAGNOSIS — R410 Disorientation, unspecified: Secondary | ICD-10-CM

## 2020-04-29 DIAGNOSIS — R4182 Altered mental status, unspecified: Secondary | ICD-10-CM

## 2020-04-29 DIAGNOSIS — D649 Anemia, unspecified: Secondary | ICD-10-CM

## 2020-04-29 DIAGNOSIS — R443 Hallucinations, unspecified: Secondary | ICD-10-CM | POA: Diagnosis not present

## 2020-04-29 DIAGNOSIS — C649 Malignant neoplasm of unspecified kidney, except renal pelvis: Secondary | ICD-10-CM

## 2020-04-29 DIAGNOSIS — N390 Urinary tract infection, site not specified: Secondary | ICD-10-CM

## 2020-04-29 DIAGNOSIS — Z515 Encounter for palliative care: Secondary | ICD-10-CM

## 2020-04-29 DIAGNOSIS — C7951 Secondary malignant neoplasm of bone: Secondary | ICD-10-CM | POA: Diagnosis not present

## 2020-04-29 DIAGNOSIS — E871 Hypo-osmolality and hyponatremia: Secondary | ICD-10-CM

## 2020-04-29 DIAGNOSIS — I10 Essential (primary) hypertension: Secondary | ICD-10-CM | POA: Diagnosis not present

## 2020-04-29 DIAGNOSIS — Z79899 Other long term (current) drug therapy: Secondary | ICD-10-CM

## 2020-04-29 LAB — CBC
HCT: 26.3 % — ABNORMAL LOW (ref 36.0–46.0)
Hemoglobin: 7.9 g/dL — ABNORMAL LOW (ref 12.0–15.0)
MCH: 25.5 pg — ABNORMAL LOW (ref 26.0–34.0)
MCHC: 30 g/dL (ref 30.0–36.0)
MCV: 84.8 fL (ref 80.0–100.0)
Platelets: 203 10*3/uL (ref 150–400)
RBC: 3.1 MIL/uL — ABNORMAL LOW (ref 3.87–5.11)
RDW: 20.8 % — ABNORMAL HIGH (ref 11.5–15.5)
WBC: 8.5 10*3/uL (ref 4.0–10.5)
nRBC: 0 % (ref 0.0–0.2)

## 2020-04-29 LAB — MAGNESIUM: Magnesium: 2.1 mg/dL (ref 1.7–2.4)

## 2020-04-29 LAB — VITAMIN B12: Vitamin B-12: 5216 pg/mL — ABNORMAL HIGH (ref 180–914)

## 2020-04-29 LAB — BASIC METABOLIC PANEL
Anion gap: 11 (ref 5–15)
BUN: 15 mg/dL (ref 8–23)
CO2: 16 mmol/L — ABNORMAL LOW (ref 22–32)
Calcium: 7.6 mg/dL — ABNORMAL LOW (ref 8.9–10.3)
Chloride: 108 mmol/L (ref 98–111)
Creatinine, Ser: 0.76 mg/dL (ref 0.44–1.00)
GFR, Estimated: >60 mL/min (ref >60)
Glucose, Bld: 74 mg/dL (ref 70–99)
Potassium: 4.4 mmol/L (ref 3.5–5.1)
Sodium: 135 mmol/L (ref 135–145)

## 2020-04-29 LAB — TYPE AND SCREEN
ABO/RH(D): O POS
Antibody Screen: NEGATIVE

## 2020-04-29 LAB — SEDIMENTATION RATE: Sed Rate: 60 mm/hr — ABNORMAL HIGH (ref 0–30)

## 2020-04-29 LAB — PHOSPHORUS: Phosphorus: 2.8 mg/dL (ref 2.5–4.6)

## 2020-04-29 LAB — URINE CULTURE: Culture: 20000 — AB

## 2020-04-29 LAB — C-REACTIVE PROTEIN: CRP: 10.5 mg/dL — ABNORMAL HIGH (ref <1.0)

## 2020-04-29 IMAGING — MR MR HEAD WO/W CM
13 series · 48 of 48 positions shown · IV contrast (gadavist)
Comparison: [DATE] MRIs of the head and prior. [DATE] head
CT.

CLINICAL DATA: Metastatic disease evaluation.

EXAM:
MRI HEAD WITHOUT AND WITH CONTRAST
TECHNIQUE: Multiplanar, multiecho pulse sequences of the brain and surrounding
structures were obtained without and with intravenous contrast.
CONTRAST:  7mL GADAVIST GADOBUTROL 1 MMOL/ML IV SOLN

[Series 2: ax dwi_tracew · axial · 3.0mm · 1.31mm/px · z∈[-71,+79]mm · 6 of 48 slices shown]
[im 1/48]
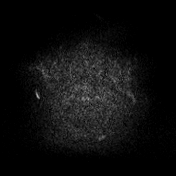
[im 10/48]
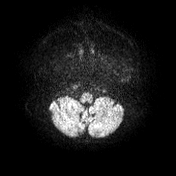
[im 19/48]
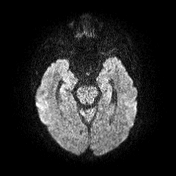
[im 29/48]
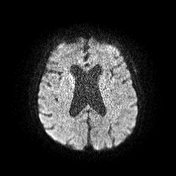
[im 38/48]
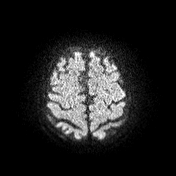
[im 48/48]
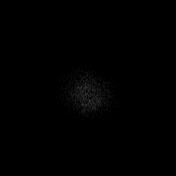

[Series 3: ax dwi_adc · axial · 3.0mm · 1.31mm/px · z∈[-71,+79]mm · 5 of 47 slices shown]
[im 1/47]
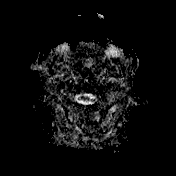
[im 12/47]
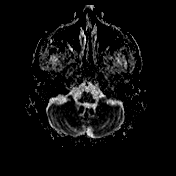
[im 24/47]
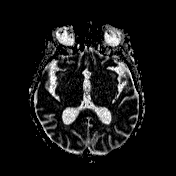
[im 35/47]
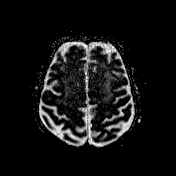
[im 47/47]
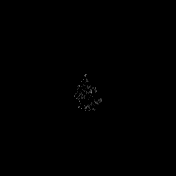

[Series 4: cor dwi_tracew · coronal · 5.0mm · 1.31mm/px · 4 of 38 slices shown]
[im 1/38]
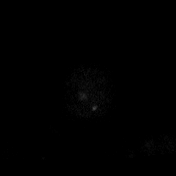
[im 13/38]
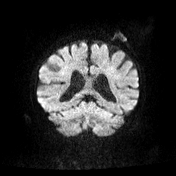
[im 25/38]
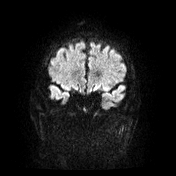
[im 38/38]
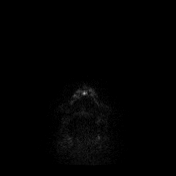

[Series 5: cor dwi_adc · coronal · 5.0mm · 1.31mm/px · 4 of 38 slices shown]
[im 1/38]
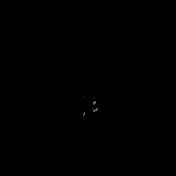
[im 13/38]
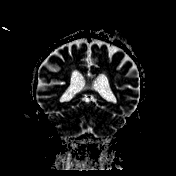
[im 25/38]
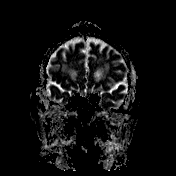
[im 38/38]
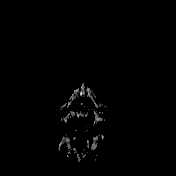

[Series 6: T1 · sagittal · 5.0mm · 0.94mm/px · 3 of 23 slices shown (1 of 3)]
[im 1/23]
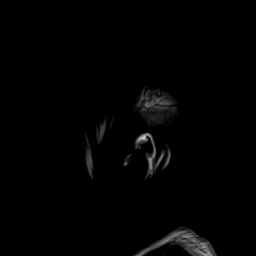
[im 12/23]
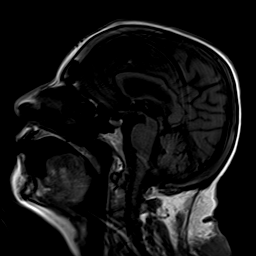
[im 23/23]
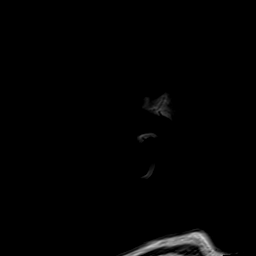

[Series 7: T2 · axial · 5.0mm · 0.45mm/px · z∈[-71,+80]mm · 3 of 27 slices shown (1 of 2)]
[im 1/27]
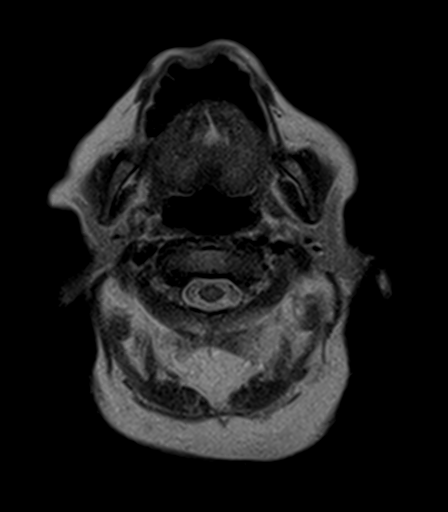
[im 14/27]
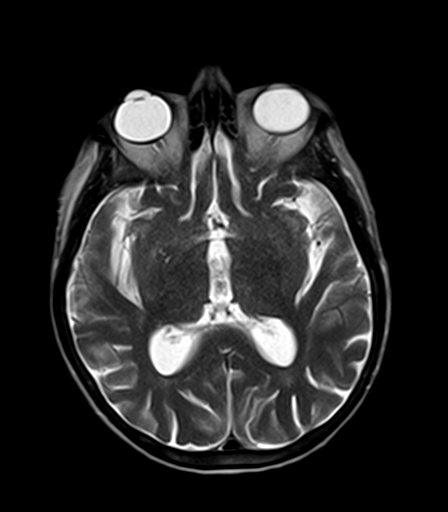
[im 27/27]
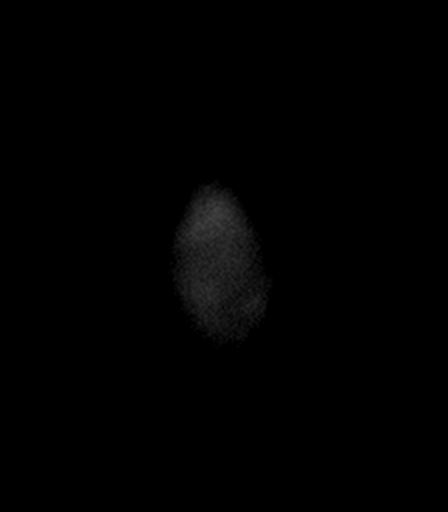

[Series 8: T2-star · axial · 5.0mm · 0.45mm/px · z∈[-71,+80]mm · 3 of 27 slices shown]
[im 1/27]
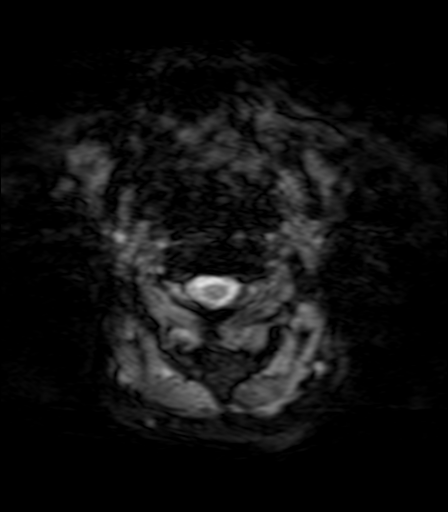
[im 14/27]
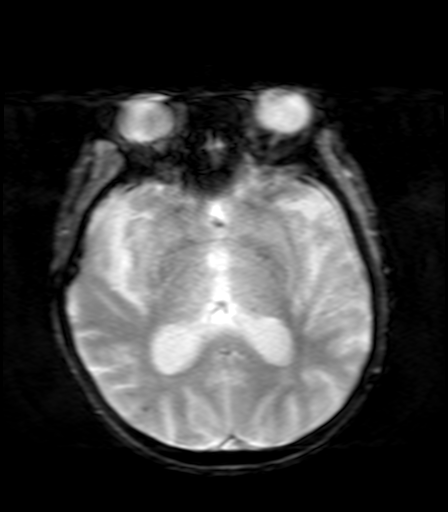
[im 27/27]
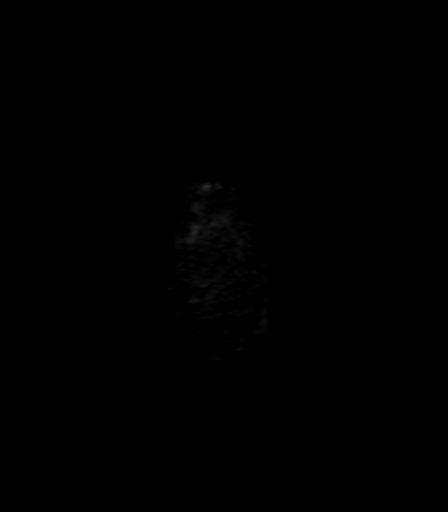

[Series 9: FLAIR · axial · 5.0mm · 1.20mm/px · z∈[-71,+80]mm · 3 of 27 slices shown]
[im 1/27]
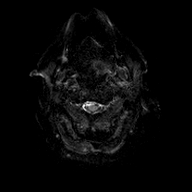
[im 14/27]
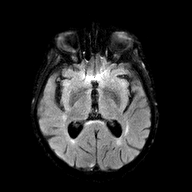
[im 27/27]
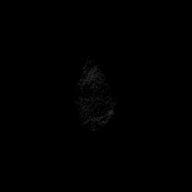

[Series 10: T1 · axial · 5.0mm · 0.90mm/px · z∈[-71,+80]mm · 3 of 27 slices shown (2 of 3)]
[im 1/27]
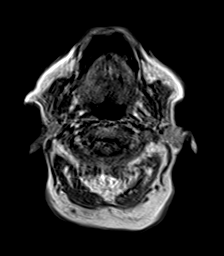
[im 14/27]
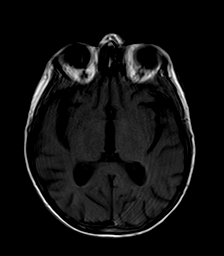
[im 27/27]
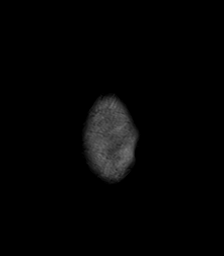

[Series 11: T2 · coronal · 5.0mm · 0.45mm/px · 4 of 31 slices shown (2 of 2)]
[im 1/31]
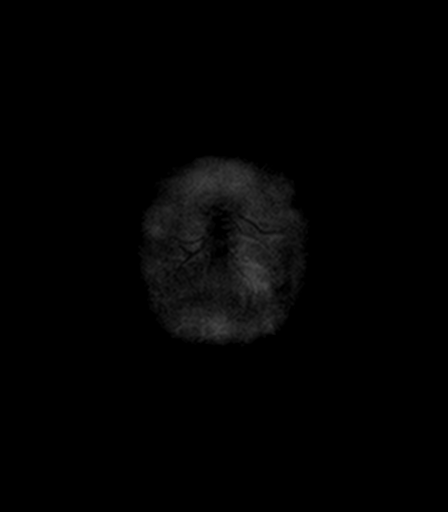
[im 11/31]
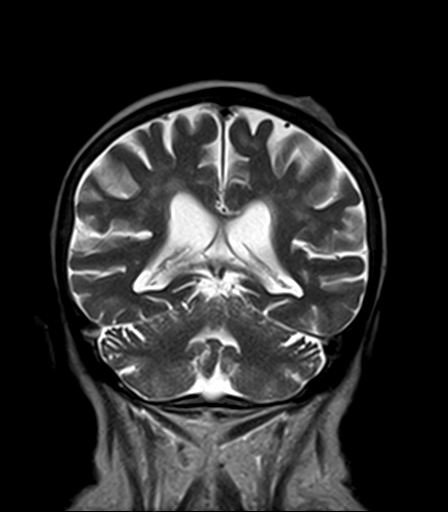
[im 21/31]
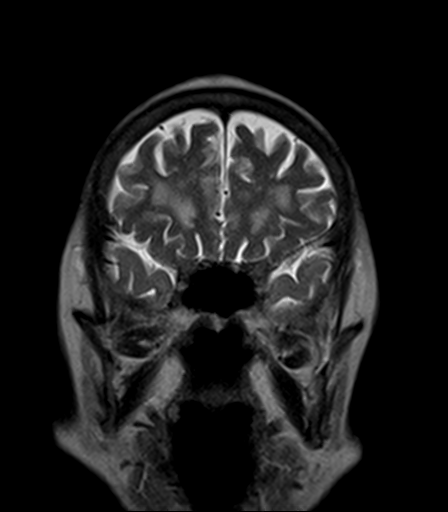
[im 31/31]
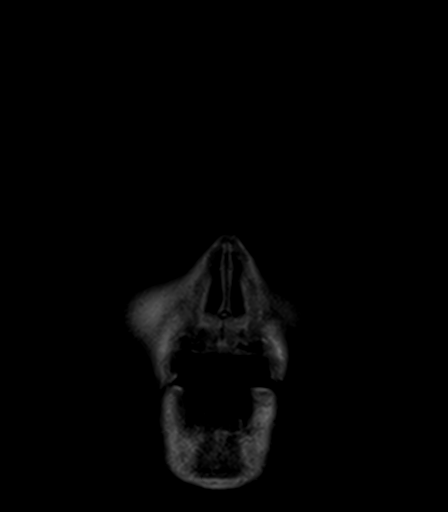

[Series 12: T1 post-contrast · axial · 5.0mm · 0.90mm/px · z∈[-71,+80]mm · 3 of 27 slices shown (1 of 2)]
[im 1/27]
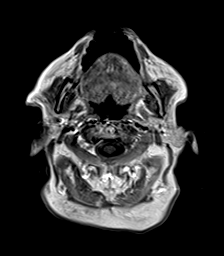
[im 14/27]
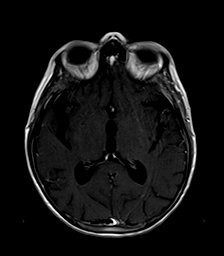
[im 27/27]
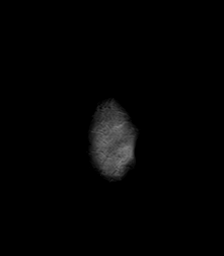

[Series 13: T1 post-contrast · coronal · 5.0mm · 0.90mm/px · 4 of 31 slices shown (2 of 2)]
[im 1/31]
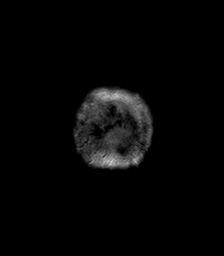
[im 11/31]
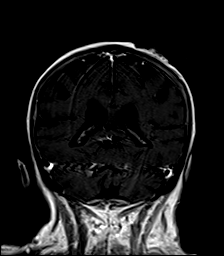
[im 21/31]
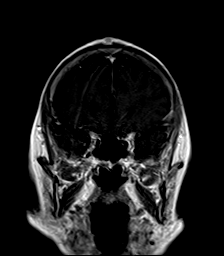
[im 31/31]
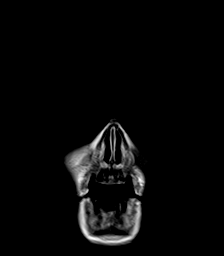

[Series 14: T1 · sagittal · 5.0mm · 0.94mm/px · 3 of 23 slices shown (3 of 3)]
[im 1/23]
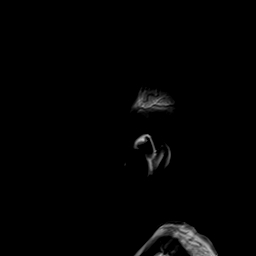
[im 12/23]
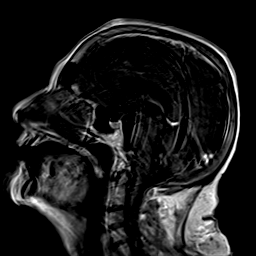
[im 23/23]
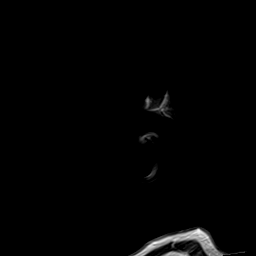

[48 of 48 positions shown; findings below may reference images not displayed]

FINDINGS: Brain: Mild cerebral atrophy with ex vacuo dilatation. Advanced
chronic microvascular ischemic changes. Chronic right thalamic
insult.

Scattered foci of DWI hyperintensity involving the right frontal,
left parietal and bilateral occipital regions, some of which
demonstrate ADC correlate are new. Prior foci of restricted
diffusion are not demonstrated on the current exam.

Tiny focus of SWI signal dropout involving the right occipital lobe
is unchanged. No midline shift, ventriculomegaly or extra-axial
fluid collection. No abnormal enhancement.

Please note that image quality is degraded by motion artifact.

Vascular: Proximally preserved major intracranial flow voids.

Skull and upper cervical spine: No focal osseous lesion.

Sinuses/Orbits: Sequela of bilateral lens replacement. Pneumatized
paranasal sinuses and mastoid air cells.

Other: 0.9 cm anterior midline enhancing scalp nodule is unchanged
([DATE]). Redemonstration of 3.0 cm enhancing left parietal scalp
mass ([DATE]). Enhancing 3 mm left temporal focus ([DATE]) not
demonstrated on prior exam.
IMPRESSION: Scattered foci of DWI hyperintensity involving the right frontal,
left parietal and bilateral occipital regions are suspicious for
acute/subacute infarcts. No correlated of enhancement or SWI signal
dropout.

3 mm enhancing left temporal lesion is new. Differential includes
small metastasis versus vascular in origin.

Prior foci of restricted diffusion are not demonstrated on the
current exam. Advanced chronic microvascular ischemic changes.

Grossly unchanged appearance of enhancing anterior midline and left
parietal scalp lesions.

These results will be called to the ordering clinician or
representative by the Radiologist Assistant, and communication
documented in the PACS or [REDACTED].

## 2020-04-29 MED ORDER — HALOPERIDOL 2 MG PO TABS
2.0000 mg | ORAL_TABLET | Freq: Four times a day (QID) | ORAL | Status: DC | PRN
  Administered 2020-04-30: 2 mg via ORAL
  Filled 2020-04-29 (×2): qty 1

## 2020-04-29 MED ORDER — GADOBUTROL 1 MMOL/ML IV SOLN
7.0000 mL | Freq: Once | INTRAVENOUS | Status: AC | PRN
  Administered 2020-04-29: 11:00:00 7 mL via INTRAVENOUS

## 2020-04-29 MED ORDER — KETOROLAC TROMETHAMINE 30 MG/ML IJ SOLN
15.0000 mg | Freq: Once | INTRAMUSCULAR | Status: AC
  Administered 2020-04-29: 07:00:00 15 mg via INTRAVENOUS
  Filled 2020-04-29: qty 1

## 2020-04-29 MED ORDER — SODIUM CHLORIDE 0.9 % IV SOLN
1.0000 g | Freq: Four times a day (QID) | INTRAVENOUS | Status: DC
  Administered 2020-04-29 – 2020-04-30 (×4): 1 g via INTRAVENOUS
  Filled 2020-04-29 (×2): qty 1000
  Filled 2020-04-29: qty 1
  Filled 2020-04-29 (×3): qty 1000
  Filled 2020-04-29: qty 1
  Filled 2020-04-29 (×2): qty 1000

## 2020-04-29 NOTE — Evaluation (Addendum)
Clinical/Bedside Swallow Evaluation Patient Details  Name: Mariah Thornton MRN: [id number] Date of Birth: 1946/01/17  Today's Date: 04/29/2020 Time: SLP Start Time (ACUTE ONLY): 1055 SLP Stop Time (ACUTE ONLY): 1155 SLP Time Calculation (min) (ACUTE ONLY): 60 min  Past Medical History: History reviewed. No pertinent past medical history. Past Surgical History:  Past Surgical History:  Procedure Laterality Date  . PORTA CATH INSERTION N/A 03/28/2020   Procedure: PORTA CATH INSERTION;  Surgeon: Algernon Huxley, MD;  Location: [facility];  Service: Cardiovascular;  Laterality: N/A;  . TEE WITHOUT CARDIOVERSION N/A 04/22/2020   Procedure: TRANSESOPHAGEAL ECHOCARDIOGRAM (TEE);  Surgeon: Minna Merritts, MD;  Location: [facility];  Service: Cardiovascular;  Laterality: N/A;   HPI:  Pt is a 74 y.o. 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  Pt's past medical history of HTN, CVA, normocytic anemia, folate deficiency, bilateral lower extremity edema, chronic fatigue.  She presents to the ED for assessment of moderate hallucinations and Confusion.  A recent fall several weeks ago but no falls last couple days.  Husband reported reduced oral intake in recent days, mostly drinking of liquids.    Assessment / Plan / Recommendation Clinical Impression  Pt appears to present w/ grossly adequate oropharyngeal phase swallow function w/ no overt coughing w/ thin liquids, purees noted; no neuromuscular deficits noted. However, pt has significant change/decline in mental/Cognitive status which appears to impact her overall awareness w/ po tasks -- including some impulsivity during drinking of thin liquids, straw use. She required Mod verbal cues and visual presentation of the po tasks being given w/ feeding support/guidance. Pt  consumed po trials w/ No overt coughing, though noted mild delayed throat clearing post large sip of thin liquid via Straw. W/ further monitoring of straw drinking, no further overt s/s of aspiration were noted during po trials. Oral phase appeared [facility] w/ timely bolus management and control of bolus propulsion for A-P transfer for swallowing. Oral clearing achieved w/ all trial consistencies. OM Exam appeared [facility] w/ no unilateral weakness noted. Speech Clear. Pt is missing Dentition including front lower ones; she does not wear partial per husband present. Pt required Full feeding support d/t Cognitive decline, confusion and impulsivity.  Pt appears at reduced risk for aspiration following general aspiration precautions and w/ Supervision and Feeding support. Recommend a Pureed consistency diet; Thin liquidsVIA CUP if increased coughing noted w/ straw drinking. Recommend general aspiration precautions, Pills Crushed in Puree for safer, easier swallowing. Education given on Pills in Puree; food consistencies and easy to eat options; general aspiration precautions. Also recommended REFLUX precautions as pt exhibited fairly consistency Belching/Burping post trials. ST services will f/u w/ trials to upgrade pt's diet consistency as pt's Confusion reduces. Husband updated. NSG updated, agreed.  SLP Visit Diagnosis: Dysphagia, oropharyngeal phase (R13.12) (Cognitive decline)    Aspiration Risk  Mild aspiration risk;Risk for inadequate nutrition/hydration (reduced following precautions, supervision)    Diet Recommendation  Dysphagia level 1 (puree) w/ thin liquids -- straw monitoring. 100% Supervision and Feeding support at meals d/t confusion currently. General aspiration and REFLUX precautions.    Medication Administration: Crushed with puree (for safer swallowing)    Other  Recommendations Recommended Consults:  (Dietician) Oral Care Recommendations: Oral care BID;Oral care before and after PO;Staff/trained  caregiver to provide oral care Other Recommendations:  (n/a currently)  Follow up Recommendations None (TBD)      Frequency and Duration min 2x/week  1 week       Prognosis Prognosis for Safe Diet Advancement: Fair Barriers to Reach Goals: Cognitive deficits;Severity of deficits;Time post onset;Behavior      Swallow Study   General Date of Onset: 04/27/20 HPI: Pt is a 74 y.o. 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  Pt's past medical history of HTN, CVA, normocytic anemia, folate deficiency, bilateral lower extremity edema, chronic fatigue.  She presents to the ED for assessment of moderate hallucinations and Confusion.  A recent fall several weeks ago but no falls last couple days.  Husband reported reduced oral intake in recent days, mostly drinking of liquids.  Type of Study: Bedside Swallow Evaluation Previous Swallow Assessment: none noted Diet Prior to this Study: NPO (regular diet at home) Temperature Spikes Noted: No (wbc 8.5) Respiratory Status: Room air History of Recent Intubation: No Behavior/Cognition: Alert;Cooperative;Pleasant mood;Confused;Distractible;Requires cueing;Impulsive Oral Cavity Assessment: Within Functional Limits Oral Care Completed by SLP: Yes Oral Cavity - Dentition: Missing dentition Vision: Functional for self-feeding Self-Feeding Abilities: Needs assist;Needs set up;Total assist Patient Positioning: Upright in bed (needed positioning) Baseline Vocal Quality: Normal Volitional Cough: Strong Volitional Swallow: Able to elicit    Oral/Motor/Sensory Function Overall Oral Motor/Sensory Function: Within functional limits (primarily w/ bolus management)   Ice Chips Ice chips: Within functional limits Presentation: Spoon (fed; 3 trials) Other Comments: grossly    Thin Liquid Thin Liquid: Impaired Presentation: Cup;Self Fed;Straw (4 trials via each method) Oral Phase Impairments: Poor awareness of bolus (for sucking straw intermittently) Oral Phase Functional Implications:  (none) Pharyngeal  Phase Impairments: Throat Clearing - Delayed (x1 when taking too large a sip) Other Comments: missing dentition in front    Nectar Thick Nectar Thick Liquid: Not tested   Honey Thick Honey Thick Liquid: Not tested   Puree Puree: Within functional limits Presentation: Spoon (fed; 10+ trials)   Solid     Solid: Not tested       Orinda Kenner, MS, CCC-SLP Speech Language Pathologist [facility] [phone] Nareg Breighner 04/29/2020,3:32 PM

## 2020-04-29 NOTE — Progress Notes (Addendum)
PROGRESS NOTE  Mariah Thornton:[id number] DOB: Apr 28, 1946 DOA: 04/27/2020 PCP: Philmore Pali, NP   LOS: 1 day   Brief narrative: As per HPI,  Mariah Thornton is a 74 y.o. female with medical history significant for stage IV renal cell carcinoma (followed by Dr. Mike Gip) s/p1 cycle of Keytruda on 04/07/2020 and onoral Inlyta as well as history of HTN, CVA, chronic anemia, recently hospitalized from 10/13-10/16 with excessive somnolence, multifactorial etiology including small infarcts seen on MRI brain, pain medications and dehydration from poor oral intake who was sent by the oncology clinic for admission due to complaints of hallucinations consisting of seeing animals around her.  Patient denied headache, visual disturbance or one-sided weakness numbness or tingling. On arrival to the ED, vitals were within normal limits with somewhat soft blood pressure of 117/51.  Blood work mostly notable for hemoglobin of 8.6, which is about her baseline, normal WBC of 10.3 but with elevated lactic acid of 2.2, sodium 128.  Urinalysis showed moderate leukocyte esterase. EKG showed  normal sinus rhythm with no acute ST-T wave changes. Patient was started on IV fluids, Rocephin.  Hospitalist consulted for admission for possible sepsis secondary to UTI with hallucinations and agitation.  Assessment/Plan:  Principal Problem:   Hallucinations Active Problems:   Bone metastases (HCC)   Hypertension   UTI (urinary tract infection)   Metastatic renal cell carcinoma (HCC)   Hypotension   Metabolic encephalopathy  Hallucinations, metabolic encephalopathy with delirium Slightly improved. Possible underlying UTI.  Neurology and oncology on board.  Repeat MRI has been planned for today.  CT head scan showed no acute intracranial findings. Random cortisol was 20.0. Procalcitonin 0.3. WBC 8.9.  Sodium level 135.Marland Kitchen  Blood cultures negative in 2 days.  COVID-19 and flu was negative. Urine drug screen was negative.  Ammonia was 24. Liver function test within normal limits. Urinalysis was mildly abnormal, urine culture with insignificant colonies of Enterococcus.  Check thiamine levels.  RPR, vitamin B12, vitamin B1 and CRP has been requested.  We will add palliative care consult.   Volume depletion. improved IV fluid hydration.  Ok for PO and will dc iv fluids if seen by speech therapy.   Urine tract infection   No leukocytosis or fever but lactate was elevated. Responded to IV fluids. On iv rocephin. Lactate was 1.7 after fluid resuscitation from 2.4.  Urine culture with insignificant  colonies of Enterococcus currently. Will change to ampicillin.  Recent embolic CVA Continue aspirin and Plavix.   CT head scan was negative for bleeding.  We will continue aspirin alone after 3 weeks of dual antiplatelets.  Folic acid deficiency. Continue folate.   Metastatic renal cell carcinoma (HCC) to bone and lung Patient with confusion, disorientation agitation and hallucination at this time.  Oncology on board.   Patient has received [facility] as [facility].  Considering MRI brain today.   DVT prophylaxis: enoxaparin (LOVENOX) injection 40 mg Start: 04/27/20 2000  Code Status: Partial code, no cpr  Family Communication:   I again spoke with the  patient's husband at bedside. I again spoke with Ms. Levada Dy [phone], the patient's cousin on the phone and updated her about the clinical condition of the patient yesterday.   Status is: Inpatient  The patient will require care spanning > 2 midnights and should be moved to inpatient because: Unsafe d/c plan, IV treatments appropriate due to intensity of illness or inability to take PO, Inpatient level of care appropriate due to severity of illness and  Ongoing hallucinations, agitation and delirium, further work-up, neurology consultation.  Dispo: The patient is from: Home              Anticipated d/c is to: Undetermined at this time, likely home with home  health              Anticipated d/c date is: 2 to 3 days              Patient currently is not medically stable to d/c.  Consultants: Oncology  Neurology Palliative care  Procedures:  None  Antibiotics:  . Rocephin IV 10/20>  Anti-infectives (From admission, onward)   Start     Dose/Rate Route Frequency Ordered Stop   04/28/20 1800  cefTRIAXone (ROCEPHIN) 1 g in sodium chloride 0.9 % 100 mL IVPB        1 g 200 mL/hr over 30 Minutes Intravenous Every 24 hours 04/27/20 1956     04/27/20 1815  cefTRIAXone (ROCEPHIN) 1 g in sodium chloride 0.9 % 100 mL IVPB        1 g 200 mL/hr over 30 Minutes Intravenous  Once 04/27/20 1802 04/27/20 1917     Subjective: Today, patient was seen and examined at bedside. Still confused and disoriented but much calmer and more communicative.  Objective: Vitals:   04/28/20 1647 04/29/20 0734  BP: (!) 137/58 (!) 125/53  Pulse: 84 83  Resp: 20 15  Temp: 97.7 F (36.5 C) 97.9 F (36.6 C)  SpO2: 95% 94%   No intake or output data in the 24 hours ending 04/29/20 0740 There were no vitals filed for this visit. There is no height or weight on file to calculate BMI.   Physical Exam: GENERAL: Patient is alert awake and communicative but disoriented confused , calmer today and much more communicative.  HENT: No scleral pallor or icterus. Pupils equally reactive to light. Oral mucosa is moist NECK: is supple, no gross swelling noted. CHEST: Clear to auscultation. No crackles or wheezes.  Diminished breath sounds bilaterally. CVS: S1 and S2 heard, no murmur. Regular rate and rhythm.  ABDOMEN: Soft, non-tender, bowel sounds are present. EXTREMITIES: Bilateral lower extremity trace edema noted CNS: Moving all extremities, disoriented, confused. SKIN: warm and dry without rashes.  Data Review: I have personally reviewed the following laboratory data and studies,  CBC: Recent Labs  Lab 04/26/20 1115 04/27/20 0923 04/27/20 1315 04/28/20 0450  04/29/20 0415  WBC 11.1* 12.7* 10.3 8.9 8.5  NEUTROABS 8.4* 10.0*  --   --   --   HGB 9.5* 9.0* 8.6* 8.2* 7.9*  HCT 31.6* 29.5* 27.6* 27.3* 26.3*  MCV 83.6 83.1 81.9 82.7 84.8  PLT 231 249 220 216 272   Basic Metabolic Panel: Recent Labs  Lab 04/26/20 0914 04/26/20 1115 04/27/20 1315 04/28/20 0450 04/29/20 0415  NA 126* 126* 128* 132* 135  K 4.7 4.6 4.8 4.6 4.4  CL 99 98 101 103 108  CO2 18* 19* 17* 19* 16*  GLUCOSE 153* 98 90 81 74  BUN 17 18 19 17 15   CREATININE 0.93 0.90 0.78 0.71 0.76  CALCIUM 7.3* 7.2* 7.4* 7.5* 7.6*  MG  --   --   --   --  2.1  PHOS  --   --   --   --  2.8   Liver Function Tests: Recent Labs  Lab 04/26/20 1115 04/27/20 1315  AST 40 38  ALT 14 14  ALKPHOS 91 88  BILITOT 0.6 0.8  PROT 6.6  6.5  ALBUMIN 2.0* 2.1*   No results for input(s): LIPASE, AMYLASE in the last 168 hours. Recent Labs  Lab 04/27/20 1329  AMMONIA 24   Cardiac Enzymes: No results for input(s): CKTOTAL, CKMB, CKMBINDEX, TROPONINI in the last 168 hours. BNP (last 3 results) Recent Labs    04/27/20 1844  BNP 76.6    ProBNP (last 3 results) No results for input(s): PROBNP in the last 8760 hours.  CBG: No results for input(s): GLUCAP in the last 168 hours. Recent Results (from the past 240 hour(s))  Respiratory Panel by RT PCR (Flu A&B, Covid) - Nasopharyngeal Swab     Status: None   Collection Time: 04/21/20  7:40 AM   Specimen: Nasopharyngeal Swab  Result Value Ref Range Status   SARS Coronavirus 2 by RT PCR NEGATIVE NEGATIVE Final    Comment: (NOTE) SARS-CoV-2 target nucleic acids are NOT DETECTED.  The SARS-CoV-2 RNA is generally detectable in upper respiratoy specimens during the acute phase of infection. The lowest concentration of SARS-CoV-2 viral copies this assay can detect is 131 copies/mL. A negative result does not preclude SARS-Cov-2 infection and should not be used as the sole basis for treatment or other patient management decisions. A negative  result may occur with  improper specimen collection/handling, submission of specimen other than nasopharyngeal swab, presence of viral mutation(s) within the areas targeted by this assay, and inadequate number of viral copies (<131 copies/mL). A negative result must be combined with clinical observations, patient history, and epidemiological information. The expected result is Negative.  Fact Sheet for Patients:  PinkCheek.be  Fact Sheet for Healthcare Providers:  GravelBags.it  This test is no t yet approved or cleared by the [place] FDA and  has been authorized for detection and/or diagnosis of SARS-CoV-2 by FDA under an Emergency Use Authorization (EUA). This EUA will remain  in effect (meaning this test can be used) for the duration of the COVID-19 declaration under Section 564(b)(1) of the Act, 21 U.S.C. section 360bbb-3(b)(1), unless the authorization is terminated or revoked sooner.     Influenza A by PCR NEGATIVE NEGATIVE Final   Influenza B by PCR NEGATIVE NEGATIVE Final    Comment: (NOTE) The Xpert Xpress SARS-CoV-2/FLU/RSV assay is intended as an aid in  the diagnosis of influenza from Nasopharyngeal swab specimens and  should not be used as a sole basis for treatment. Nasal washings and  aspirates are unacceptable for Xpert Xpress SARS-CoV-2/FLU/RSV  testing.  Fact Sheet for Patients: PinkCheek.be  Fact Sheet for Healthcare Providers: GravelBags.it  This test is not yet approved or cleared by the [place] FDA and  has been authorized for detection and/or diagnosis of SARS-CoV-2 by  FDA under an Emergency Use Authorization (EUA). This EUA will remain  in effect (meaning this test can be used) for the duration of the  Covid-19 declaration under Section 564(b)(1) of the Act, 21  U.S.C. section 360bbb-3(b)(1), unless the authorization is   terminated or revoked. Performed at [facility], [address] [place], [place] [address]   Urine Culture     Status: Abnormal   Collection Time: 04/27/20  9:23 AM   Specimen: Urine, Random  Result Value Ref Range Status   Specimen Description   Final    URINE, RANDOM Performed at [facility], [address], [place], [place] [address]    Special Requests   Final    NONE Performed at [facility], [address] [place], [place] [address]  Culture 20,000 COLONIES/mL ENTEROCOCCUS FAECALIS (A)  Final   Report Status 04/29/2020 FINAL  Final   Organism ID, Bacteria ENTEROCOCCUS FAECALIS (A)  Final      Susceptibility   Enterococcus faecalis - MIC*    AMPICILLIN <=2 SENSITIVE Sensitive     NITROFURANTOIN <=16 SENSITIVE Sensitive     VANCOMYCIN 1 SENSITIVE Sensitive     * 20,000 COLONIES/mL ENTEROCOCCUS FAECALIS  Blood culture (routine x 2)     Status: None (Preliminary result)   Collection Time: 04/27/20  6:44 PM   Specimen: BLOOD  Result Value Ref Range Status   Specimen Description BLOOD PORTA CATH  Final   Special Requests   Final    BOTTLES DRAWN AEROBIC AND ANAEROBIC Blood Culture results may not be optimal due to an inadequate volume of blood received in culture bottles   Culture   Final    NO GROWTH 2 DAYS Performed at [facility], [address], [place], [place] [address]    Report Status PENDING  Incomplete  Blood culture (routine x 2)     Status: None (Preliminary result)   Collection Time: 04/27/20  6:44 PM   Specimen: BLOOD  Result Value Ref Range Status   Specimen Description BLOOD RIGHT ANTECUBITAL  Final   Special Requests   Final    BOTTLES DRAWN AEROBIC AND ANAEROBIC Blood Culture results may not be optimal due to an inadequate volume of blood received in culture bottles   Culture   Final    NO GROWTH 2 DAYS Performed at [facility], [address] [place], [place] [address]    Report Status  PENDING  Incomplete  Respiratory Panel by RT PCR (Flu A&B, Covid) - Nasopharyngeal Swab     Status: None   Collection Time: 04/27/20  6:57 PM   Specimen: Nasopharyngeal Swab  Result Value Ref Range Status   SARS Coronavirus 2 by RT PCR NEGATIVE NEGATIVE Final    Comment: (NOTE) SARS-CoV-2 target nucleic acids are NOT DETECTED.  The SARS-CoV-2 RNA is generally detectable in upper respiratoy specimens during the acute phase of infection. The lowest concentration of SARS-CoV-2 viral copies this assay can detect is 131 copies/mL. A negative result does not preclude SARS-Cov-2 infection and should not be used as the sole basis for treatment or other patient management decisions. A negative result may occur with  improper specimen collection/handling, submission of specimen other than nasopharyngeal swab, presence of viral mutation(s) within the areas targeted by this assay, and inadequate number of viral copies (<131 copies/mL). A negative result must be combined with clinical observations, patient history, and epidemiological information. The expected result is Negative.  Fact Sheet for Patients:  PinkCheek.be  Fact Sheet for Healthcare Providers:  GravelBags.it  This test is no t yet approved or cleared by the [place] FDA and  has been authorized for detection and/or diagnosis of SARS-CoV-2 by FDA under an Emergency Use Authorization (EUA). This EUA will remain  in effect (meaning this test can be used) for the duration of the COVID-19 declaration under Section 564(b)(1) of the Act, 21 U.S.C. section 360bbb-3(b)(1), unless the authorization is terminated or revoked sooner.     Influenza A by PCR NEGATIVE NEGATIVE Final   Influenza B by PCR NEGATIVE NEGATIVE Final    Comment: (NOTE) The Xpert Xpress SARS-CoV-2/FLU/RSV assay is intended as an aid in  the diagnosis of influenza from Nasopharyngeal swab specimens and   should not be used as a sole basis for treatment.  Nasal washings and  aspirates are unacceptable for Xpert Xpress SARS-CoV-2/FLU/RSV  testing.  Fact Sheet for Patients: PinkCheek.be  Fact Sheet for Healthcare Providers: GravelBags.it  This test is not yet approved or cleared by the [place] FDA and  has been authorized for detection and/or diagnosis of SARS-CoV-2 by  FDA under an Emergency Use Authorization (EUA). This EUA will remain  in effect (meaning this test can be used) for the duration of the  Covid-19 declaration under Section 564(b)(1) of the Act, 21  U.S.C. section 360bbb-3(b)(1), unless the authorization is  terminated or revoked. Performed at [facility], [address] [place], [place] [address]      Studies: DG Chest 2 View  Result Date: 04/27/2020 CLINICAL DATA:  Altered mental status EXAM: CHEST - 2 VIEW COMPARISON:  04/20/2020, 03/03/2020 FINDINGS: Right-sided Port-A-Cath remains in place. Stable heart size. Atherosclerotic calcification of the aortic knob. Scattered nodular densities most prevalent within the left lung base, better characterized on recent CT. Small bilateral pleural effusions with associated streaky bibasilar opacities, likely atelectasis. No pneumothorax. IMPRESSION: Small bilateral pleural effusions with associated streaky bibasilar opacities, likely atelectasis. Electronically Signed   By: Davina Poke D.O.   On: 04/27/2020 16:49   CT Head Wo Contrast  Result Date: 04/27/2020 CLINICAL DATA:  Mental status change, unknown cause. EXAM: CT HEAD WITHOUT CONTRAST TECHNIQUE: Contiguous axial images were obtained from the base of the skull through the vertex without intravenous contrast. COMPARISON:  MRI 04/21/2020, CT head 04/20/2020. FINDINGS: Brain: No evidence of acute large vascular territory infarction, hemorrhage, hydrocephalus, extra-axial collection or mass  lesion/mass effect. Patchy white matter hypoattenuation appears similar to prior and most likely represents chronic microvascular ischemic disease. Vascular: Calcific atherosclerosis. Skull: Similar left frontal scalp lesion near the vertex and smaller nodule at the midline anteriorly. No acute fracture. Sinuses/Orbits: Clear sinuses.  Unremarkable orbits. Other: No mastoid effusions. IMPRESSION: 1. No evidence of acute intracranial abnormality. 2. 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 3. Similar left frontal scalp lesion near the vertex and smaller nodule at the midline anteriorly. Electronically Signed   By: Margaretha Sheffield MD   On: 04/27/2020 13:59      Flora Lipps, MD  [facility] 04/29/2020

## 2020-04-29 NOTE — Progress Notes (Signed)
Pt remained in bed throughout the shift with continuous confusion and hallucinations however Pt is not combative during the shift as sitter remained at the bedside to accommodate all immediate needs and promote safety.

## 2020-04-29 NOTE — Consult Note (Signed)
[place]  [phone] [phone]   Name: Mariah Thornton Date: 04/29/2020 MRN: [id number]  DOB: Dec 16, 1945  Patient Care Team: Philmore Pali, NP as PCP - General (Nurse Practitioner)    REASON FOR CONSULTATION: Mariah Thornton is a 74 y.o. 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  Patient also had an MRI of the lumbar spine revealing widespread metastatic disease and epidural tumor at L1.  She was seen in [facility] on 04/27/2020 again for altered mental status and was found to have hyponatremia with possible UTI.  Patient was subsequently sent to the ER and hospitalized for same.  Palliative care was consulted to help address goals.  SOCIAL HISTORY:     reports that she has never smoked. She has never used smokeless tobacco. She reports current alcohol use of about 1.0 standard drink of alcohol per week. She reports previous drug use.   Patient is married and lives at home with her husband.  They had a son who died many years ago.  Patient formally worked in housekeeping.  ADVANCE DIRECTIVES:  Does not have  CODE STATUS: DNR/DNI (DNR signed on 04/27/2020)  PAST MEDICAL HISTORY:History reviewed. No pertinent past medical history.  PAST SURGICAL HISTORY:  Past Surgical History:  Procedure Laterality Date  . PORTA CATH INSERTION N/A 03/28/2020   Procedure: PORTA CATH INSERTION;  Surgeon: Algernon Huxley, MD;  Location: [facility];  Service: Cardiovascular;  Laterality: N/A;  . TEE WITHOUT CARDIOVERSION N/A 04/22/2020   Procedure: TRANSESOPHAGEAL ECHOCARDIOGRAM (TEE);  Surgeon: Minna Merritts, MD;  Location: [facility];   Service: Cardiovascular;  Laterality: N/A;    HEMATOLOGY/ONCOLOGY HISTORY:  Oncology History  Renal cell carcinoma of right kidney ([place])  03/16/2020 Initial Diagnosis   Renal cell carcinoma of right kidney ([place])   04/07/2020 -  Chemotherapy   The patient had pembrolizumab (KEYTRUDA) 200 mg in sodium chloride 0.9 % 50 mL chemo infusion, 200 mg, Intravenous, Once, 1 of 6 cycles Administration: 200 mg (04/07/2020)  for chemotherapy treatment.      ALLERGIES:  has No Known Allergies.  MEDICATIONS:  Current Facility-Administered Medications  Medication Dose Route Frequency Provider Last Rate Last Admin  . 0.9 %  sodium chloride infusion   Intravenous Continuous Athena Masse, MD 100 mL/hr at 04/28/20 1705 New Bag at 04/28/20 1705  . acetaminophen (TYLENOL) tablet 650 mg  650 mg Oral Q6H PRN Athena Masse, MD       Or  . acetaminophen (TYLENOL) suppository 650 mg  650 mg Rectal Q6H PRN Athena Masse, MD      . amLODipine (NORVASC) tablet 10 mg  10 mg Oral Daily Pokhrel, Laxman, MD   10 mg at 04/28/20 1359  . ampicillin (OMNIPEN) 1 g in sodium chloride 0.9 % 100 mL IVPB  1 g Intravenous Q6H Pokhrel, Laxman, MD 300 mL/hr at 04/29/20 1429 1 g at 04/29/20 1429  . aspirin EC tablet 81 mg  81 mg Oral Daily Pokhrel, Laxman, MD   81 mg at 04/28/20 1357  . Chlorhexidine Gluconate Cloth 2 % PADS 6 each  6 each Topical Daily Pokhrel, Laxman, MD   6 each at 04/29/20  0948  . clopidogrel (PLAVIX) tablet 75 mg  75 mg Oral Daily Pokhrel, Laxman, MD   75 mg at 04/28/20 1359  . enoxaparin (LOVENOX) injection 40 mg  40 mg Subcutaneous Q24H Athena Masse, MD   40 mg at 04/28/20 2123  . folic acid (FOLVITE) tablet 1 mg  1 mg Oral Daily Pokhrel, Laxman, MD   1 mg at 04/28/20 1357  . ondansetron (ZOFRAN) tablet 4 mg  4 mg Oral Q6H PRN Athena Masse, MD       Or  . ondansetron [facility]) injection 4 mg  4 mg Intravenous Q6H PRN Athena Masse, MD        VITAL SIGNS: BP 104/65 (BP Location: Left Arm)    Pulse 75   Temp 97.7 F (36.5 C)   Resp 15   Ht 5' 2"  (1.575 m)   Wt 154 lb 9.6 oz (70.1 kg)   SpO2 95%   BMI 28.28 kg/m  Filed Weights   04/29/20 0900  Weight: 154 lb 9.6 oz (70.1 kg)    Estimated body mass index is 28.28 kg/m as calculated from the following:   Height as of this encounter: 5' 2"  (1.575 m).   Weight as of this encounter: 154 lb 9.6 oz (70.1 kg).  LABS: CBC:    Component Value Date/Time   WBC 8.5 04/29/2020 0415   HGB 7.9 (L) 04/29/2020 0415   HCT 26.3 (L) 04/29/2020 0415   PLT 203 04/29/2020 0415   MCV 84.8 04/29/2020 0415   NEUTROABS 10.0 (H) 04/27/2020 0923   LYMPHSABS 1.1 04/27/2020 0923   MONOABS 1.1 (H) 04/27/2020 0923   EOSABS 0.2 04/27/2020 0923   BASOSABS 0.0 04/27/2020 0923   Comprehensive Metabolic Panel:    Component Value Date/Time   NA 135 04/29/2020 0415   K 4.4 04/29/2020 0415   CL 108 04/29/2020 0415   CO2 16 (L) 04/29/2020 0415   BUN 15 04/29/2020 0415   CREATININE 0.76 04/29/2020 0415   GLUCOSE 74 04/29/2020 0415   CALCIUM 7.6 (L) 04/29/2020 0415   AST 38 04/27/2020 1315   ALT 14 04/27/2020 1315   ALKPHOS 88 04/27/2020 1315   BILITOT 0.8 04/27/2020 1315   PROT 6.5 04/27/2020 1315   ALBUMIN 2.1 (L) 04/27/2020 1315    RADIOGRAPHIC STUDIES: EEG  Result Date: 04/22/2020 Alexis Goodell, MD     04/22/2020  7:10 PM ELECTROENCEPHALOGRAM REPORT Patient: Mariah Thornton       Room #: 129A-AA EEG No. ID: [id number] Age: 74 y.o.        Sex: female Requesting Physician: Amery Report Date:  04/22/2020       Interpreting Physician: Alexis Goodell History: Mariah Thornton is an 74 y.o. female with altered mental status Medications: Norvasc, ASA Conditions of Recording:  This is a 21 channel routine scalp EEG performed with bipolar and monopolar montages arranged in accordance to the international 10/20 system of electrode placement. One channel was dedicated to EKG recording. The patient is in the lethargic state. Description:  The background  activity is not continuous.  There are short periods when an alpha background rhythm can be achieved but these alternate with frequent, generalized periods of polymorphic delta activity that is most prominent frontally and has the characteristics of FIRDA.  These periods last from 5-6 seconds.  There are also noted intermittent discharges of triphasic morphology.  Hyperventilation was not performed.  Intermittent photic stimulation was performed but failed to illicit any change in the tracing.  IMPRESSION: This is an abnormal electroencephalogram secondary to the presence of frequent periods of slowing that are more prominent frontally (FIRDA) and triphasic waves.  These findings may be seen with a diffuse cerebral disturbance that is etiologically nonspecific, but may include a metabolic encephalopathy, among other possibilities.  Alexis Goodell, MD Neurology 04/22/2020, 7:05 PM   DG Chest 2 View  Result Date: 04/27/2020 CLINICAL DATA:  Altered mental status EXAM: CHEST - 2 VIEW COMPARISON:  04/20/2020, 03/03/2020 FINDINGS: Right-sided Port-A-Cath remains in place. Stable heart size. Atherosclerotic calcification of the aortic knob. Scattered nodular densities most prevalent within the left lung base, better characterized on recent CT. Small bilateral pleural effusions with associated streaky bibasilar opacities, likely atelectasis. No pneumothorax. IMPRESSION: Small bilateral pleural effusions with associated streaky bibasilar opacities, likely atelectasis. Electronically Signed   By: Davina Poke D.O.   On: 04/27/2020 16:49   DG Chest 2 View  Result Date: 04/20/2020 CLINICAL DATA:  74 year old female with weakness. Bilateral lower extremity swelling. Metastatic renal cell carcinoma. EXAM: CHEST - 2 VIEW COMPARISON:  Chest CT 03/03/2020 and earlier. FINDINGS: Lower lung volumes. Right chest power port. Stable mediastinal contours. Small bilateral pleural effusions. Increased bilateral pulmonary  interstitial opacity, with nodular pulmonary metastasis most visible at the left lung base. Paucity of bowel gas in the upper abdomen. Degenerative osseous changes. No destructive osseous lesion identified radiographically. IMPRESSION: 1. Pulmonary metastatic disease re-demonstrated with new small pleural effusions since August. 2. Paucity of bowel gas in the upper abdomen.  Query ascites. Electronically Signed   By: Genevie Ann M.D.   On: 04/20/2020 22:24   CT Head Wo Contrast  Result Date: 04/27/2020 CLINICAL DATA:  Mental status change, unknown cause. EXAM: CT HEAD WITHOUT CONTRAST TECHNIQUE: Contiguous axial images were obtained from the base of the skull through the vertex without intravenous contrast. COMPARISON:  MRI 04/21/2020, CT head 04/20/2020. FINDINGS: Brain: No evidence of acute large vascular territory infarction, hemorrhage, hydrocephalus, extra-axial collection or mass lesion/mass effect. Patchy white matter hypoattenuation appears similar to prior and most likely represents chronic microvascular ischemic disease. Vascular: Calcific atherosclerosis. Skull: Similar left frontal scalp lesion near the vertex and smaller nodule at the midline anteriorly. No acute fracture. Sinuses/Orbits: Clear sinuses.  Unremarkable orbits. Other: No mastoid effusions. IMPRESSION: 1. No evidence of acute intracranial abnormality. 2. 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 3. Similar left frontal scalp lesion near the vertex and smaller nodule at the midline anteriorly. Electronically Signed   By: Margaretha Sheffield MD   On: 04/27/2020 13:59   CT Head Wo Contrast  Result Date: 04/20/2020 CLINICAL DATA:  Mental status changes, history of renal cell carcinoma EXAM: CT HEAD WITHOUT CONTRAST TECHNIQUE: Contiguous axial images were obtained from the base of the skull through the vertex without intravenous contrast. COMPARISON:  02/26/2020 FINDINGS: Brain: Mild atrophic changes and chronic white  matter ischemic changes are seen stable from the prior exam. No findings to suggest acute hemorrhage, acute infarction or space-occupying mass lesion are noted. Vascular: No hyperdense vessel or unexpected calcification. Skull: Normal. Negative for fracture or focal lesion. Sinuses/Orbits: No acute finding. Other: Scalp soft tissue nodule is noted on the left near the vertex stable from the prior exam. IMPRESSION: Chronic atrophic and ischemic changes without acute abnormality. Stable soft tissue scalp lesion near the vertex on the left. Smaller nodule is noted in the midline anteriorly. Electronically Signed   By: Inez Catalina M.D.   On: 04/20/2020 23:03   MR BRAIN WO CONTRAST  Result Date: 04/21/2020 CLINICAL DATA:  Mental status change, unknown cause. Additional history obtained from [place] with medical history of recently diagnosed renal cell carcinoma metastatic to bone and lung on chemotherapy. EXAM: MRI HEAD WITHOUT CONTRAST TECHNIQUE: Multiplanar, multiecho pulse sequences of the brain and surrounding structures were obtained without intravenous contrast. COMPARISON:  Noncontrast head CT 04/20/2020. FINDINGS: Brain: Stable, mild generalized cerebral atrophy. There are subcentimeter foci of restricted diffusion within the posterior right frontal lobe white matter (series 2, image 40), within the right frontoparietal white matter (series 2, image 38), within the left parietal lobe (series 2, image 37). An additional subcentimeter focus of restricted diffusion is questioned within the left posterior frontal lobe white matter (series 2, image 37). Advanced multifocal T2/FLAIR hyperintensity within the cerebral white matter which is nonspecific, but consistent with chronic small vessel ischemic disease. 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 No chronic intracranial blood products. No extra-axial fluid collection. No midline shift. Vascular:  Expected proximal arterial flow voids. Skull and upper cervical spine: No focal marrow lesion. Sinuses/Orbits: Visualized orbits show no acute finding. Mild ethmoid and maxillary sinus mucosal thickening. No significant mastoid effusion. Other: Again demonstrated is a 3.2 cm left parietal scalp mass (series 6, image 17). Also redemonstrated is a subcentimeter nodular lesion within the midline anterior scalp (series 6, image 12). IMPRESSION: Subcentimeter foci of restricted diffusion within the right frontoparietal white matter and left parietal white matter (3 sites total). An additional subcentimeter focus of restricted diffusion is questioned within the posterior left frontal lobe white matter. These foci may reflect acute infarcts and are suspicious for an embolic process. Given the history of recently diagnosed renal cell carcinoma, tiny metastases cannot be excluded and follow-up contrast-enhanced MR imaging should be considered. Mild generalized cerebral atrophy. Advanced cerebral white matter chronic small vessel ischemic disease. 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 Redemonstrated 3.2 cm left parietal scalp mass as well as subcentimeter nodular lesion within the midline anterior scalp. Correlate with direct visualization and consider tissue sampling if not already performed. Electronically Signed   By: Kellie Simmering DO   On: 04/21/2020 11:39   MR BRAIN W CONTRAST  Result Date: 04/21/2020 CLINICAL DATA:  74 year old female with altered mental status. Metastatic renal cell carcinoma. Multiple small embolic infarcts versus less likely small hyperdense metastases, suspected in the hemispheres on MRI earlier today. EXAM: MRI HEAD WITH CONTRAST TECHNIQUE: Multiplanar, multiecho pulse sequences of the brain and surrounding structures were obtained with intravenous contrast. CONTRAST:  71m GADAVIST GADOBUTROL 1 MMOL/ML IV SOLN COMPARISON:  Noncontrast brain MRI 1120 hours  today. FINDINGS: No abnormal intracranial enhancement identified. No enhancement corresponding to the multiple small DWI lesions seen earlier today. No dural thickening. No intracranial mass effect. No ventriculomegaly. Basilar cisterns remain normal. The major dural venous sinuses are enhancing and appear to be patent. Negative visible cervical spine and spinal cord. Heterogeneous bone marrow signal in the calvarium but no enhancing skull lesion identified. However, there is enhancement associated with the lobulated left vertex scalp lesion (series 28, image 14). IMPRESSION: 1. No evidence of cerebral metastatic disease. No enhancement associated with the small DWI lesions demonstrated earlier today, which are therefore most compatible with multiple small infarcts. 2. The irregular 3.2 cm left vertex scalp soft mass is enhancing. Electronically Signed   By: HGenevie AnnM.D.   On: 04/21/2020 18:46   MR BRAIN W WO CONTRAST  Result Date: 04/29/2020  CLINICAL DATA:  Metastatic disease evaluation. EXAM: MRI HEAD WITHOUT AND WITH CONTRAST TECHNIQUE: Multiplanar, multiecho pulse sequences of the brain and surrounding structures were obtained without and with intravenous contrast. CONTRAST:  31m GADAVIST GADOBUTROL 1 MMOL/ML IV SOLN COMPARISON:  04/21/2020 MRIs of the head and prior. 04/27/2020 head CT. FINDINGS: Brain: Mild cerebral atrophy with ex vacuo dilatation. Advanced chronic microvascular ischemic changes. Chronic right thalamic insult. Scattered foci of DWI hyperintensity involving the right frontal, left parietal and bilateral occipital regions, some of which demonstrate ADC correlate are new. Prior foci of restricted diffusion are not demonstrated on the current exam. Tiny focus of SWI signal dropout involving the right occipital lobe is unchanged. No midline shift, ventriculomegaly or extra-axial fluid collection. No abnormal enhancement. Please note that image quality is degraded by motion artifact. Vascular:  Proximally preserved major intracranial flow voids. Skull and upper cervical spine: No focal osseous lesion. Sinuses/Orbits: Sequela of bilateral lens replacement. Pneumatized paranasal sinuses and mastoid air cells. Other: 0.9 cm anterior midline enhancing scalp nodule is unchanged (12:26). Redemonstration of 3.0 cm enhancing left parietal scalp mass (12:25). Enhancing 3 mm left temporal focus (12:17) not demonstrated on prior exam. IMPRESSION: Scattered foci of DWI hyperintensity involving the right frontal, left parietal and bilateral occipital regions are suspicious for acute/subacute infarcts. No correlated of enhancement or SWI signal dropout. 3 mm enhancing left temporal lesion is new. Differential includes small metastasis versus vascular in origin. Prior foci of restricted diffusion are not demonstrated on the current exam. Advanced chronic microvascular ischemic changes. Grossly unchanged appearance of enhancing anterior midline and left parietal scalp lesions. These results will be called to the ordering clinician or representative by the Radiologist Assistant, and communication documented in the PACS or [employer] Electronically Signed   By: CPrimitivo GauzeM.D.   On: 04/29/2020 11:26   MR Lumbar Spine W Wo Contrast  Result Date: 04/21/2020 CLINICAL DATA:  74year old female with altered mental status. Metastatic renal cell carcinoma. Staging. EXAM: MRI LUMBAR SPINE WITHOUT AND WITH CONTRAST TECHNIQUE: Multiplanar and multiecho pulse sequences of the lumbar spine were obtained without and with intravenous contrast. CONTRAST:  668mGADAVIST GADOBUTROL 1 MMOL/ML IV SOLN COMPARISON:  CT Abdomen and Pelvis 03/04/2020. FINDINGS: Segmentation:  Normal on the comparison CT. Alignment:  Stable lumbar lordosis since August. Vertebrae: Widespread spinal vertebral metastases. And much of the bony metastatic disease seems to be poorly enhancing (such as the expansile lesion in the right L1 posterior  elements seen on series 13, image 7 and postcontrast series 31, image 7). The T12 vertebra appear spared. The L4 and L5 vertebra appear spared. (There is L3 spinous process tumor suspected). Scattered involvement of the visible sacrum and medial iliac bones. Mild pathologic compression fracture of L2 (series 11, image 8 is new since August. Conus medullaris and cauda equina: Conus extends to the T12 level. No lower spinal cord or conus signal abnormality. No abnormal intradural enhancement. There is mild dural thickening and/or early epidural tumor along the right posterior thecal sac at L1. No other lumbar epidural tumor is identified. The posterior element tumor at L1 is associated with some extraosseous extension into the muscle, as well as regional erector spinae muscle edema (series 11, image 8). Paraspinal and other soft tissues: Abnormal right kidney. Abnormally thickened and nodular visible right hemidiaphragm. Bulky gallstones (series 11, image 1). Large benign left renal cyst. Evidence of generalized intra-abdominal and subcutaneous edema. Diverticulosis of large bowel in the pelvis. Disc levels: Moderate for age lumbar  spine degeneration. There is mild degenerative spinal stenosis (such as at L2-L3). There is no malignant neural impingement at this time. IMPRESSION: 1. Widespread osseous metastatic disease in the visible spine and pelvis. A mild pathologic compression fracture of L2 is new since August. 2. Isolated early epidural tumor along the right posterior thecal sac at L1. No other lumbar epidural tumor. Extraosseous extension of tumor into the right erector spinae muscle at L1. No malignant neural impingement at this time. 3. Superimposed lumbar spine degeneration. Electronically Signed   By: Genevie Ann M.D.   On: 04/21/2020 18:55   NM Bone Scan Whole Body  Result Date: 04/06/2020 CLINICAL DATA:  Lower back and pelvic pain for couple weeks, history of renal cell carcinoma EXAM: NUCLEAR MEDICINE  WHOLE BODY BONE SCAN TECHNIQUE: Whole body anterior and posterior images were obtained approximately 3 hours after intravenous injection of radiopharmaceutical. RADIOPHARMACEUTICALS:  20.917 mCi Technetium-33mMDP IV COMPARISON:  None Correlation: CT abdomen and pelvis 03/04/2020, CT chest 03/03/2020 FINDINGS: Abnormal tracer uptake identified at inferior RIGHT scapula, L1 vertebral body, RIGHT L2 vertebra, and distal LEFT femoral metaphysis suspicious for osseous metastatic disease. Degenerative type uptake at shoulders, sternoclavicular joints, and knees. Soft tissue distribution of tracer unremarkable. Minimal RIGHT renal tracer corresponding to known large RIGHT renal neoplasm decreased RIGHT renal function. Photopenic defect at mid LEFT kidney corresponding to large cyst on CT. IMPRESSION: Foci of abnormal tracer uptake at the inferior RIGHT scapula, L1 and L2 vertebral bodies, and distal LEFT femoral metaphysis consistent with osseous metastases. Large LEFT renal cyst and known large RIGHT renal neoplasm with poor RIGHT renal function. Electronically Signed   By: MLavonia DanaM.D.   On: 04/06/2020 11:23   UKoreaCarotid Bilateral  Result Date: 04/23/2020 CLINICAL DATA:  74year old female with stroke-like symptoms. EXAM: BILATERAL CAROTID DUPLEX ULTRASOUND TECHNIQUE: GPearline Cablesscale imaging, color Doppler and duplex ultrasound were performed of bilateral carotid and vertebral arteries in the neck. COMPARISON:  None. FINDINGS: Criteria: Quantification of carotid stenosis is based on velocity parameters that correlate the residual internal carotid diameter with NASCET-based stenosis levels, using the diameter of the distal internal carotid lumen as the denominator for stenosis measurement. The following velocity measurements were obtained: RIGHT ICA: Peak systolic velocity 59 cm/sec, End diastolic velocity 9 cm/sec CCA: Peak systolic velocity 74 cm/sec SYSTOLIC ICA/CCA RATIO:  0.8 ECA: Peak systolic velocity 71  cm/sec LEFT ICA: Peak systolic velocity 69 cm/sec, End diastolic velocity 11 cm/sec CCA: 58 cm/sec SYSTOLIC ICA/CCA RATIO:  1.2 ECA: 63 cm/sec RIGHT CAROTID ARTERY: Minimal atherosclerotic plaque formation at the carotid bifurcation. No significant tortuosity. Normal low resistance waveforms. RIGHT VERTEBRAL ARTERY:  Antegrade flow. LEFT CAROTID ARTERY: Minimal atherosclerotic plaque formation at the carotid bifurcation. No significant tortuosity. Normal low resistance waveforms. LEFT VERTEBRAL ARTERY:  Antegrade flow. Upper extremity non-invasive blood pressures: Not obtained. IMPRESSION: 1. Right carotid artery system: Patent without significant atherosclerotic plaque formation. 2. Left carotid artery system: Patent without significant atherosclerotic plaque formation. 3.  Vertebral artery system: Patent with antegrade flow bilaterally. DRuthann Cancer MD [facility] Electronically Signed   By: DRuthann CancerMD   On: 04/23/2020 11:28   UKoreaVenous Img Lower Bilateral  Result Date: 04/21/2020 CLINICAL DATA:  Bilateral lower extremity edema, concern for deep venous thrombosis. History of right renal cell carcinoma. No injury. No pain. EXAM: BILATERAL LOWER EXTREMITY VENOUS DOPPLER ULTRASOUND TECHNIQUE: Gray-scale sonography with compression, as well as color and duplex ultrasound, were performed to  evaluate the deep venous system(s) from the level of the common femoral vein through the popliteal and proximal calf veins. COMPARISON:  CT abdomen pelvis 03/04/2020. FINDINGS: VENOUS Normal compressibility of the common femoral, superficial femoral, and popliteal veins, as well as the visualized calf veins. Visualized portions of profunda femoral vein and great saphenous vein unremarkable. No filling defects to suggest DVT on grayscale or color Doppler imaging. Doppler waveforms show normal direction of venous flow, normal respiratory plasticity and response to  augmentation. Limited views of the contralateral common femoral vein are unremarkable. OTHER None. Limitations: none IMPRESSION: No femoropopliteal DVT nor evidence of DVT within the visualized calf veins. If clinical symptoms are inconsistent or if there are persistent or worsening symptoms, further imaging (possibly involving the iliac veins) may be warranted. Electronically Signed   By: Iven Finn M.D.   On: 04/21/2020 02:14   ECHOCARDIOGRAM COMPLETE  Result Date: 04/06/2020    ECHOCARDIOGRAM REPORT   Patient Name:   HUBERT DERSTINE Date of Exam: 04/06/2020 Medical Rec #:  [id number]     Height:       62.0 in Accession #:    [id number]    Weight:       150.1 lb Date of Birth:  26-Jun-1946      BSA:          1.692 m Patient Age:    89 years      BP:           119/68 mmHg Patient Gender: F             HR:           88 bpm. Exam Location:  Outpatient Procedure: 2D Echo, Cardiac Doppler and Color Doppler Indications:    Chemo evaluation  History:        Patient has no prior history of Echocardiogram examinations.                 Renal cell carcinoma.  Sonographer:    Dustin Flock Referring Phys: [id number] [place] Comments: Patient is morbidly obese. Global longitudinal strain was attempted. IMPRESSIONS  1. Left ventricular ejection fraction, by estimation, is 70 to 75%. The left ventricle has hyperdynamic function. The left ventricle has no regional wall motion abnormalities. There is mild left ventricular hypertrophy. Left ventricular diastolic parameters are consistent with Grade I diastolic dysfunction (impaired relaxation).  2. Right ventricular systolic function is normal. The right ventricular size is normal. There is normal pulmonary artery systolic pressure. The estimated right ventricular systolic pressure is 82.9 mmHg.  3. The mitral valve is grossly normal. No evidence of mitral valve regurgitation.  4. The aortic valve is tricuspid. Aortic valve regurgitation is not  visualized. Aortic valve mean gradient measures 9.0 mmHg.  5. The inferior vena cava is normal in size with greater than 50% respiratory variability, suggesting right atrial pressure of 3 mmHg. Comparison(s): No prior Echocardiogram. FINDINGS  Left Ventricle: Left ventricular ejection fraction, by estimation, is 70 to 75%. The left ventricle has hyperdynamic function. The left ventricle has no regional wall motion abnormalities. The left ventricular internal cavity size was normal in size. There is mild left ventricular hypertrophy. Left ventricular diastolic parameters are consistent with Grade I diastolic dysfunction (impaired relaxation). Indeterminate filling pressures. Right Ventricle: The right ventricular size is normal. No increase in right ventricular wall thickness. Right ventricular systolic function is normal. There is normal pulmonary artery systolic pressure. The tricuspid regurgitant velocity is 1.59 m/s,  and  with an assumed right atrial pressure of 3 mmHg, the estimated right ventricular systolic pressure is 32.1 mmHg. Left Atrium: Left atrial size was normal in size. Right Atrium: Right atrial size was normal in size. Pericardium: There is no evidence of pericardial effusion. Mitral Valve: The mitral valve is grossly normal. No evidence of mitral valve regurgitation. Tricuspid Valve: The tricuspid valve is grossly normal. Tricuspid valve regurgitation is trivial. Aortic Valve: The aortic valve is tricuspid. Aortic valve regurgitation is not visualized. Aortic valve mean gradient measures 9.0 mmHg. Aortic valve peak gradient measures 18.7 mmHg. Aortic valve area, by VTI measures 2.61 cm. Pulmonic Valve: The pulmonic valve was normal in structure. Pulmonic valve regurgitation is not visualized. Aorta: The aortic root and ascending aorta are structurally normal, with no evidence of dilitation. Venous: The inferior vena cava is normal in size with greater than 50% respiratory variability, suggesting  right atrial pressure of 3 mmHg. IAS/Shunts: No atrial level shunt detected by color flow Doppler.  LEFT VENTRICLE PLAX 2D LVIDd:         3.90 cm  Diastology LVIDs:         2.30 cm  LV e' medial:    6.64 cm/s LV PW:         1.20 cm  LV E/e' medial:  10.9 LV IVS:        1.10 cm  LV e' lateral:   8.70 cm/s LVOT diam:     2.00 cm  LV E/e' lateral: 8.3 LV SV:         98 LV SV Index:   58 LVOT Area:     3.14 cm  RIGHT VENTRICLE             IVC RV Basal diam:  2.00 cm     IVC diam: 1.20 cm RV S prime:     13.70 cm/s TAPSE (M-mode): 1.9 cm LEFT ATRIUM             Index       RIGHT ATRIUM          Index LA diam:        3.40 cm 2.01 cm/m  RA Area:     9.56 cm LA Vol (A2C):   61.7 ml 36.46 ml/m RA Volume:   14.20 ml 8.39 ml/m LA Vol (A4C):   26.9 ml 15.90 ml/m LA Biplane Vol: 43.9 ml 25.94 ml/m  AORTIC VALVE AV Area (Vmax):    2.08 cm AV Area (Vmean):   2.41 cm AV Area (VTI):     2.61 cm AV Vmax:           216.00 cm/s AV Vmean:          141.000 cm/s AV VTI:            0.375 m AV Peak Grad:      18.7 mmHg AV Mean Grad:      9.0 mmHg LVOT Vmax:         143.00 cm/s LVOT Vmean:        108.000 cm/s LVOT VTI:          0.312 m LVOT/AV VTI ratio: 0.83  AORTA Ao Root diam: 3.20 cm Ao Asc diam:  3.20 cm MITRAL VALVE               TRICUSPID VALVE MV Area (PHT): 2.99 cm    TR Peak grad:   10.1 mmHg MV Decel Time: 254 msec    TR Vmax:  159.00 cm/s MV E velocity: 72.40 cm/s MV A velocity: 88.70 cm/s  SHUNTS MV E/A ratio:  0.82        Systemic VTI:  0.31 m                            Systemic Diam: 2.00 cm Lyman Bishop MD Electronically signed by Lyman Bishop MD Signature Date/Time: 04/06/2020/3:52:03 PM    Final    ECHO TEE  Result Date: 04/22/2020    TRANSESOPHOGEAL ECHO REPORT   Patient Name:   LANEA VANKIRK Date of Exam: 04/22/2020 Medical Rec #:  [id number]     Height:       62.0 in Accession #:    [id number]    Weight:       145.0 lb Date of Birth:  1946-02-24      BSA:          1.667 m Patient Age:    41 years       BP:           144/129 mmHg Patient Gender: F             HR:           84 bpm. Exam Location:  [facility] Procedure: Transesophageal Echo, Cardiac Doppler and Color Doppler Indications:     Not listed on check-in form  History:         Patient has prior history of Echocardiogram examinations, most                  recent 04/06/2020. No medical history on file.  Sonographer:     Sherrie Sport RDCS (AE) Referring Phys:  [place] Diagnosing Phys: Ida Rogue MD PROCEDURE: After discussion of the risks and benefits of a TEE, an informed consent was obtained from a family member. The transesophogeal probe was passed without difficulty through the esophogus of the patient. Local oropharyngeal anesthetic was provided with Cetacaine and viscous lidocaine. Sedation performed by performing physician. Patients was under conscious sedation during this procedure. Anesthetic administered: 2mg of Fentanyl, 2.050mof Versed. Image quality was excellent. The patient's vital signs; including heart rate, blood pressure, and oxygen saturation; remained stable throughout the procedure. The patient developed no complications during the procedure. IMPRESSIONS  1. No thrombus noted in the left atrium or left atrial appendage.  2. Left ventricular ejection fraction, by estimation, is 60 to 65%. The left ventricle has normal function. The left ventricle has no regional wall motion abnormalities. There is mild left ventricular hypertrophy.  3. Right ventricular systolic function is normal. The right ventricular size is normal.  4. The mitral valve is normal in structure. Mild mitral valve regurgitation.  5. There is Moderate (Grade III) atheroma plaque involving the transverse and descending aorta.  6. No valve vegetation Conclusion(s)/Recommendation(s): Normal biventricular function without evidence of hemodynamically significant valvular heart disease. FINDINGS  Left Ventricle: Left ventricular ejection fraction, by  estimation, is 60 to 65%. The left ventricle has normal function. The left ventricle has no regional wall motion abnormalities. The left ventricular internal cavity size was normal in size. There is  mild left ventricular hypertrophy. Right Ventricle: The right ventricular size is normal. No increase in right ventricular wall thickness. Right ventricular systolic function is normal. Left Atrium: Left atrial size was normal in size. No left atrial/left atrial appendage thrombus was detected. Right Atrium: Right atrial size was normal in size. Pericardium: There  is no evidence of pericardial effusion. Mitral Valve: The mitral valve is normal in structure. Mild mitral valve regurgitation. No evidence of mitral valve stenosis. Tricuspid Valve: The tricuspid valve is normal in structure. Tricuspid valve regurgitation is mild . No evidence of tricuspid stenosis. Aortic Valve: The aortic valve is normal in structure. Aortic valve regurgitation is not visualized. No aortic stenosis is present. Pulmonic Valve: The pulmonic valve was normal in structure. Pulmonic valve regurgitation is not visualized. No evidence of pulmonic stenosis. Aorta: The aortic root is normal in size and structure. There is moderate (Grade III) atheroma plaque involving the transverse and descending aorta. Venous: The inferior vena cava is normal in size with greater than 50% respiratory variability, suggesting right atrial pressure of 3 mmHg. IAS/Shunts: No atrial level shunt detected by color flow Doppler. Agitated saline contrast was given intravenously to evaluate for intracardiac shunting. Ida Rogue MD Electronically signed by Ida Rogue MD Signature Date/Time: 04/22/2020/1:06:47 PM    Final    DG FEMUR MIN 2 VIEWS LEFT  Result Date: 04/08/2020 CLINICAL DATA:  Left femur met seen on bone scan. Evaluate bone integrity. EXAM: LEFT FEMUR 2 VIEWS COMPARISON:  Bone scan 04/05/2020 FINDINGS: Two view exam of the left femur shows no  evidence for worrisome lytic or sclerotic osseous abnormality. Patient has substantial tricompartmental degenerative change in the knee without metaphyseal lesion. Uptake on the bone scan could potentially have been degenerative. IMPRESSION: No suspicious lytic or sclerotic osseous abnormality in the metaphysis of the distal left femur to suggest metastatic disease. Marked tricompartmental degenerative changes. Degenerative disease could account for some of the uptake seen in the distal left femur. Electronically Signed   By: Misty Stanley M.D.   On: 04/08/2020 15:05    PERFORMANCE STATUS (ECOG) : 3 - Symptomatic, >50% confined to bed  Review of Systems Unable to complete  Physical Exam General: Thin, frail-appearing Pulmonary: Unlabored Extremities: no edema, no joint deformities Skin: no rashes Neurological: Alert but confused  IMPRESSION: Patient known to me from the clinic.  I saw her on 04/27/2020 briefly prior to her being sent to the ER.  At that time, I discussed goals as family had some questions about possible hospice involvement due to the increasing difficulty with managing her care at home. They verbalized interest in pursuing cancer treatment if patient is able but would entertain the idea of less aggressive measures in the event of decline.  Family decided to send her to the ER.  Family opted for DNR/DNI and a DNR order was signed and placed in her chart.  Today, patient remains pleasantly confused.  She appears cognitively unchanged from 10/20.  She is alert and readily engages in conversation but is oriented only to person.  She is requiring a sitter for safety and assistance with ADLs.  Her husband is at bedside.  Patient is status post MRI of the brain today.  She was found to have scattered foci in the right frontal, left parietal, and bilateral occipital regions suspicious for acute/subacute infarcts.  She was also found to have a 3 mm left temporal lesion concerning for  either metastasis versus vascular etiology.  I met with patient's husband to discuss goals.  He recognizes that her current neurological function could potentially be a new baseline.  He is concerned that she would require too much care at home.  I would recommend PT/OT involvement and consideration of SNF.   Hyponatremia has improved.  However, per sitter, patient's oral intake was only  25% at lunch.  I would recommend calorie count over the weekend.  PLAN: -Continue current scope of treatment -DNR/DNI -Recommend PT/OT with consideration of SNF -Recommend calorie count    Time Total: 60 minutes  Visit consisted of counseling and education dealing with the complex and emotionally intense issues of symptom management and palliative care in the setting of serious and potentially life-threatening illness.Greater than 50%  of this time was spent counseling and coordinating care related to the above assessment and plan.  Signed by: Altha Harm, PhD, NP-C

## 2020-04-29 NOTE — TOC Initial Note (Signed)
Transition of Care [facility]) - Initial/Assessment Note    Patient Details  Name: Mariah Thornton MRN: [id number] Date of Birth: [birth year]  [facility]) CM/SW Contact:    Magnus Ivan, LCSW Phone Number: 04/29/2020, 2:21 PM  Clinical Narrative:                CSW received a call from family friend/caregiver, Levada Dy. Patient's spouse is hard of hearing and defers to Levada Dy for communication regarding patient.  Levada Dy reported it was her understanding from conversation she had with ED CSW Maurice Small that she needed to go to the bank and move money out of patient's account. Spoke to [place], then advised Levada Dy that this was not recommended. Advised her that Maurice Small encouraged her to reach out to DSS to begin Medicaid application for patient if they are leaning towards long term placement. Levada Dy reported if patient does not return to her baseline (is unable to care for herself), they plan to look for ALF or SNF placement, not sure if long-term or for short-term rehab, depends on what is recommended.   Levada Dy reported patient lives with her husband. Levada Dy and her mother Juliann Pulse) provide support, errands, and transportation. Levada Dy reported prior to this hospitalization patient was fairly independent with ADLs. PCP is Charlott Holler (NP). Pharmacy is [facility]. Patient has a wheelchair, walker, raised toilet seat, shower chair, and lift recliner at home. Was supposed to start [facility] services after last hospitalization, but had to come back to the hospital before these services could begin. No SNF history.   CSW will continue to follow.    Expected Discharge Plan:  (unknown) Barriers to Discharge: Continued Medical Work up   Patient Goals and CMS Choice Patient states their goals for this hospitalization and ongoing recovery are:: depends on her functional level per caregiver, either home or ALF/SNF CMS Medicare.gov Compare Post Acute Care list provided to:: Patient Represenative (must  comment) Choice offered to / list presented to :  (caregiver Levada Dy)  Expected Discharge Plan and Services Expected Discharge Plan:  (unknown) In-house Referral: Clinical Social Work   Post Acute Care Choice: [place] Living arrangements for the past 2 months: [place]                                      Prior Living Arrangements/Services Living arrangements for the past 2 months: Single Family Home Lives with:: Spouse Patient language and need for interpreter reviewed:: Yes Do you feel safe going back to the place where you live?: Yes      Need for Family Participation in Patient Care: Yes (Comment) Care giver support system in place?: Yes (comment) Current home services: DME Criminal Activity/Legal Involvement Pertinent to Current Situation/Hospitalization: No - Comment as needed  Activities of Daily Living Home Assistive Devices/Equipment: Gilford Rile (specify type) ADL Screening (condition at time of admission) Patient's cognitive ability adequate to safely complete daily activities?: No Is the patient deaf or have difficulty hearing?: No Does the patient have difficulty seeing, even when wearing glasses/contacts?: No Does the patient have difficulty concentrating, remembering, or making decisions?: Yes Patient able to express need for assistance with ADLs?: No Does the patient have difficulty dressing or bathing?: Yes Independently performs ADLs?: No Communication: Independent Dressing (OT): Dependent Is this a change from baseline?: Pre-admission baseline Grooming: Independent Is this a change from baseline?: Pre-admission baseline Feeding: Needs assistance Is this a  change from baseline?: Change from baseline, expected to last >3 days Bathing: Dependent Is this a change from baseline?: Pre-admission baseline Does the patient have difficulty walking or climbing stairs?: Yes Weakness of Legs: Both Weakness of Arms/Hands:  None  Permission Sought/Granted Permission sought to share information with : Facility Art therapist granted to share information with : Yes, Verbal Permission Granted (by caregiver)  Share Information with NAMEAZELIE, NOGUERA (Spouse) [phone]  Permission granted to share info w AGENCY: SNFs, HH, ALFs, DME agencies as needed     Permission granted to share info w Contact Information: Levada Dy (family friend/caregiver)  Emotional Assessment Appearance:: Appears stated age Attitude/Demeanor/Rapport: Unable to Assess (Altered mental state) Affect (typically observed): Unable to Assess (Altered mental state) Orientation: : Fluctuating Orientation (Suspected and/or reported Sundowners) Alcohol / Substance Use: Not Applicable Psych Involvement: No (comment)  Admission diagnosis:  Hallucinations [N05.3] Metabolic encephalopathy [Z76.73] Hyponatremia [E87.1] Hypoalbuminemia [E88.09] Low hemoglobin [D64.9] Cystitis [N30.90] Malignant neoplasm of kidney, unspecified laterality ([place]) [C64.9] Altered mental status, unspecified altered mental status type [R41.82] Patient Active Problem List   Diagnosis Date Noted  . Metabolic encephalopathy 41/93/7902  . Hallucinations 04/27/2020  . UTI (urinary tract infection) 04/27/2020  . Sepsis ([place]) 04/27/2020  . Metastatic renal cell carcinoma ([place]) 04/27/2020  . Hypotension 04/27/2020  . 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 Fatigue   . Palliative care encounter   . Somnolence 04/21/2020  . Low back pain 04/21/2020  . Hypertension 04/20/2020  . Hyperkalemia 04/20/2020  . Bilateral lower extremity edema 04/20/2020  . Bone metastases ([place]) 04/10/2020  . Cancer-related pain 04/10/2020  . Encounter for antineoplastic immunotherapy 04/07/2020  . Goals of care, counseling/discussion 03/22/2020  . Renal cell carcinoma of right kidney ([place]) 03/16/2020  . Normocytic anemia 03/16/2020  .  Folate deficiency 03/16/2020  . Hypercalcemia 03/03/2020   PCP:  Philmore Pali, NP Pharmacy:   [place], [place] [address] Phone: [phone] Fax: [phone]  [place], [place] [address] [place] [address] Phone: [phone] Fax: [phone]     Social Determinants of Health (SDOH) Interventions    Readmission Risk Interventions Readmission Risk Prevention Plan 04/29/2020  Transportation Screening Complete  Medication Review (RN Care Manager) Complete  PCP or Specialist appointment within 3-5 days of discharge Complete  HRI or [place] Complete  SW Recovery Care/Counseling Consult Complete  Skilled [place] Complete  Some recent data might be hidden

## 2020-04-29 NOTE — Consult Note (Signed)
[facility]  Date of admission:  04/27/2020  Inpatient day:  04/28/2020.  Consulting physician: Dr Flora Lipps.   Reason for Consultation:  Metastatic renal cell carcinoma  Chief Complaint: Mariah Thornton is a 74 y.o. female with metastatic renal cell carcinoma who was admitted through the emergency room with altered mental status.  HPI:  The patient was admitted to [facility] from 03/03/2020 - 09/01/2021with altered mental status, hypercalcemia, and a lung nodule.  Work-up revealed a large right renal mass, multiple lytic lesions, stigmata of cirrhosis and splenomegaly.  CT guided biopsy of a large posterior neck mass on 03/08/2020 confirmed clear cell renal cell carcinoma.  She was folate deficient.  She received Zometa.  Corrected calcium improved from 13.2 to 9.8. Her mental status cleared.  She received cycle #1 pembrolizumab on 04/07/2020 and began oral axitinib on 04/15/2020 (last dose 04/19/2020).  She was admitted to [facility] from 04/20/2020 - 04/23/2020 for somnolence, hypertension, and electrolyte abnormalities (sodium 129 and potassium 5.5).  Corrected calcium was 9.5.  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 EEG on 04/22/2020 was abnormal secondary to the presence of frequent periods of slowing that are more prominent frontally (FIRDA) and triphasic waves. These findings may be seen with diffusecerebraldisturbance that is etiologically nonspecific, but may include a metabolic encephalopathy, among other possibilities.  She was seen by Dr Alexis Goodell of neurology. 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  Transesophageal echo on 04/22/2020 revealed an EF of 60-65%.  There was no thrombus in the left atrium or left atrial appendage.  There was no PFO.  There were no vegetations.  Carotid ultrasound on 04/23/2020 revealed no significant atherosclerotic  plaque formation in the right and left carotid artery systems.  She was discharged on aspirin and Plavix.  She was seen in the medical oncology clinic on 04/26/2020.  She was back to her baseline health.  She was eating and drinking more.  She denied any headaches, visual changes, numbness or weakness. Mentation was normal.  She was not taking any pain medications except for Tylenol.  Cutaneous renal metastasis had decreased in size.  We discussed plans to reinitiate pembrolizumab on 04/28/2020.  Corrected calcium was 8.9.  Folate was 22.  The family notes that that evening her mentation changes.  She became confused.  She started seeing things that were not there. She was confused.  She had not taken any pain medications or new medications.  The patient was seen in the ER.  Vital signs were normal.  Hemoglobin 8.6.  Sodium 128.  Corrected calcium 9.0.  Creatinine 0.78.  Ammonia 24.  Lactic acid 2.2.  Urinalysis revealed moderate leukocyte esterase.  Influenza A and B and COVID-19 were negative.  Blood cultures are negative to date.  She began IVF and ceftiaxone.   History reviewed. No pertinent past medical history.  Past Surgical History:  Procedure Laterality Date  . PORTA CATH INSERTION N/A 03/28/2020   Procedure: PORTA CATH INSERTION;  Surgeon: Algernon Huxley, MD;  Location: [facility];  Service: Cardiovascular;  Laterality: N/A;  . TEE WITHOUT CARDIOVERSION N/A 04/22/2020   Procedure: TRANSESOPHAGEAL ECHOCARDIOGRAM (TEE);  Surgeon: Minna Merritts, MD;  Location: [facility] ORS;  Service: Cardiovascular;  Laterality: N/A;    History reviewed. No pertinent family history.  Social History:  reports that she has never smoked. She has never used smokeless tobacco. She reports current alcohol use of  about 1.0 standard drink of alcohol per week. She reports previous drug use.  The patient denies any exposure to radiation or toxins.  The patient lives in [place], [place].  She is retired. 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 She is accompanied by her husband, Mariah Thornton, and her friend, Mariah Thornton.  Allergies: No Known Allergies  Medications Prior to Admission  Medication Sig Dispense Refill  . amLODipine (NORVASC) 10 MG tablet Take 1 tablet (10 mg total) by mouth daily. 30 tablet 0  . aspirin EC 81 MG EC tablet Take 1 tablet (81 mg total) by mouth daily. Swallow whole. 30 tablet 2  . clopidogrel (PLAVIX) 75 MG tablet Take 1 tablet (75 mg total) by mouth daily for 21 days. 21 tablet 0  . folic acid (FOLVITE) 1 MG tablet Take 1 mg by mouth daily.    Marland Kitchen acetaminophen (TYLENOL) 325 MG tablet Take 2 tablets (650 mg total) by mouth every 6 (six) hours as needed for mild pain (or Fever >/= 101). (Patient not taking: Reported on 04/26/2020)    . axitinib (INLYTA) 5 MG tablet Take 1 tablet (5 mg total) by mouth 2 (two) times daily. (Patient not taking: Reported on 04/21/2020) 60 tablet 0  . HYDROcodone-acetaminophen (NORCO/VICODIN) 5-325 MG tablet Take 1/2 or 1 pill every 6 hours as needed for pain. (Patient not taking: Reported on 04/21/2020) 30 tablet 0  . lidocaine-prilocaine (EMLA) cream Apply to affected area once (Patient taking differently: Apply 1 application topically daily as needed. Apply to affected area once) 30 g 3  . ondansetron (ZOFRAN) 8 MG tablet Take 1 tablet (8 mg total) by mouth 2 (two) times daily as needed (Nausea or vomiting). (Patient not taking: Reported on 04/04/2020) 30 tablet 1    Review of Systems: GENERAL:  Feels "fine". No fevers, sweats or weight loss. PERFORMANCE STATUS (ECOG):  3 HEENT:  No visual changes, runny nose, sore throat, mouth sores or tenderness. Lungs: No shortness of breath or cough.  No hemoptysis. Cardiac:  No chest pain, palpitations, orthopnea, or PND. GI:  Decreased appetite.  No nausea, vomiting, diarrhea, constipation, melena or hematochezia. GU:  No urgency, frequency, dysuria, or hematuria. Musculoskeletal:  Back  pain managed with Tylenol.  No joint pain.  No muscle tenderness. Extremities:  No pain or swelling. Skin:  Cutaneous renal metastasis.  No rashes or skin changes. Neuro:  Confused.  No headache, numbness or weakness.  Poor balance. Endocrine:  No diabetes, thyroid issues, hot flashes or night sweats. Psych:  Easily agitated.  No depression or anxiety. Pain:  Back and groin pain. Review of systems:  All other systems reviewed and found to be negative.  Physical Exam:  Blood pressure (!) 137/58, pulse 84, temperature 97.7 F (36.5 C), temperature source Oral, resp. rate 20, SpO2 95 %.  GENERAL:  Confused woman comfortably on the medical unit in no acute distress. MENTAL STATUS:  Alert and oriented to person only.  Believes she is at someone's house.  Knows month (October) not year. HEAD:  Pearline Cables hair.  Normocephalic, atraumatic, face symmetric, no Cushingoid features. EYES: Pupils equal round and reactive to light and accomodation.  No conjunctivitis or scleral icterus. ENT:  Oropharynx clear without lesion.  Tongue normal. Mucous membranes dry.  RESPIRATORY:  Clear to auscultation anteriorly without rales, wheezes or rhonchi. CARDIOVASCULAR:  Regular rate and rhythm without murmur, rub or gallop. ABDOMEN:  Soft, non-tender, with active bowel sounds, and no hepatosplenomegaly.  No masses. SKIN:  Cutaneous metastasis (base of neck, scalp x 3, left upper arm and under breast).  No rashes or ulcers. EXTREMITIES: Lower extremity edema with associated tenderness.  No palpable cords. LYMPH NODES: No palpable cervical, supraclavicular, axillary or inguinal adenopathy  NEUROLOGICAL: Delirious.  Difficulty following multi-step commands.  Notes 10 nickels in a quarter.  Remembers 0 of 3 words in 5 minutes.  Unable to do finger to nose (past pointing).  Strength and sensation intact.  Gait not tested. PSYCH:  Engaging.  Easily irritated when cannot do tasks (feels someone is tricking her).   Results for  orders placed or performed during the hospital encounter of 04/27/20 (from the past 48 hour(s))  Urine Drug Screen, Qualitative ([place] only)     Status: None   Collection Time: 04/27/20  9:23 AM  Result Value Ref Range   Tricyclic, Ur Screen NONE DETECTED NONE DETECTED   Amphetamines, Ur Screen NONE DETECTED NONE DETECTED   MDMA (Ecstasy)Ur Screen NONE DETECTED NONE DETECTED   Cocaine Metabolite,Ur [place] NONE DETECTED NONE DETECTED   Opiate, Ur Screen NONE DETECTED NONE DETECTED   Phencyclidine (PCP) Ur S NONE DETECTED NONE DETECTED   Cannabinoid 50 Ng, Ur [place] NONE DETECTED NONE DETECTED   Barbiturates, Ur Screen NONE DETECTED NONE DETECTED   Benzodiazepine, Ur Scrn NONE DETECTED NONE DETECTED   Methadone Scn, Ur NONE DETECTED NONE DETECTED    Comment: (NOTE) Tricyclics + metabolites, urine    Cutoff 1000 ng/mL Amphetamines + metabolites, urine  Cutoff 1000 ng/mL MDMA (Ecstasy), urine              Cutoff 500 ng/mL Cocaine Metabolite, urine          Cutoff 300 ng/mL Opiate + metabolites, urine        Cutoff 300 ng/mL Phencyclidine (PCP), urine         Cutoff 25 ng/mL Cannabinoid, urine                 Cutoff 50 ng/mL Barbiturates + metabolites, urine  Cutoff 200 ng/mL Benzodiazepine, urine              Cutoff 200 ng/mL Methadone, urine                   Cutoff 300 ng/mL  The urine drug screen provides only a preliminary, unconfirmed analytical test result and should not be used for non-medical purposes. Clinical consideration and professional judgment should be applied to any positive drug screen result due to possible interfering substances. A more specific alternate chemical method must be used in order to obtain a confirmed analytical result. Gas chromatography / mass spectrometry (GC/MS) is the preferred confirm atory method. Performed at [facility], [address] [place], [place] [address]   Urine Culture     Status: Abnormal (Preliminary result)   Collection Time:  04/27/20  9:23 AM   Specimen: Urine, Random  Result Value Ref Range   Specimen Description      URINE, RANDOM Performed at [facility], [address] [place], [place] [address]    Special Requests      NONE Performed at [facility], [place], [place] [address]    Culture (A)     20,000 COLONIES/mL ENTEROCOCCUS FAECALIS SUSCEPTIBILITIES TO FOLLOW Performed at [facility], [place] [address], [place],  [address]    Report Status PENDING   Comprehensive metabolic panel     Status: Abnormal   Collection Time: 04/27/20  1:15 PM  Result Value Ref Range   Sodium 128 (L) 135 - 145 mmol/L   Potassium 4.8 3.5 - 5.1 mmol/L   Chloride 101 98 - 111 mmol/L   CO2 17 (L) 22 - 32 mmol/L   Glucose, Bld 90 70 - 99 mg/dL    Comment: Glucose reference range applies only to samples taken after fasting for at least 8 hours.   BUN 19 8 - 23 mg/dL   Creatinine, Ser 0.78 0.44 - 1.00 mg/dL   Calcium 7.4 (L) 8.9 - 10.3 mg/dL   Total Protein 6.5 6.5 - 8.1 g/dL   Albumin 2.1 (L) 3.5 - 5.0 g/dL   AST 38 15 - 41 U/L   ALT 14 0 - 44 U/L   Alkaline Phosphatase 88 38 - 126 U/L   Total Bilirubin 0.8 0.3 - 1.2 mg/dL   GFR, Estimated >60 >60 mL/min   Anion gap 10 5 - 15    Comment: Performed at [facility], [address] [place], [place] [address]  CBC     Status: Abnormal   Collection Time: 04/27/20  1:15 PM  Result Value Ref Range   WBC 10.3 4.0 - 10.5 K/uL   RBC 3.37 (L) 3.87 - 5.11 MIL/uL   Hemoglobin 8.6 (L) 12.0 - 15.0 g/dL   HCT 27.6 (L) 36 - 46 %   MCV 81.9 80.0 - 100.0 fL   MCH 25.5 (L) 26.0 - 34.0 pg   MCHC 31.2 30.0 - 36.0 g/dL   RDW 20.1 (H) 11.5 - 15.5 %   Platelets 220 150 - 400 K/uL   nRBC 0.0 0.0 - 0.2 %    Comment: Performed at [facility], [address] [place], [place] [address]  Protime-INR - (order if patient is taking Coumadin / Warfarin)     Status: Abnormal   Collection Time: 04/27/20  1:15 PM  Result  Value Ref Range   Prothrombin Time 16.8 (H) 11.4 - 15.2 seconds   INR 1.4 (H) 0.8 - 1.2    Comment: (NOTE) INR goal varies based on device and disease states. Performed at [facility], [address] [place], [place] [address]   Lactic acid, plasma     Status: Abnormal   Collection Time: 04/27/20  1:26 PM  Result Value Ref Range   Lactic Acid, Venous 2.2 (HH) 0.5 - 1.9 mmol/L    Comment: CRITICAL RESULT CALLED TO, READ BACK BY AND VERIFIED WITH TIFFANY JOHNSON 04/27/20 AT 1402 BY ACR/PMF Performed at [facility], [address] [place], [place] [address]   Ammonia     Status: None   Collection Time: 04/27/20  1:29 PM  Result Value Ref Range   Ammonia 24 9 - 35 umol/L    Comment: Performed at [facility], [address] [place], [place] [address]  Lactic acid, plasma     Status: Abnormal   Collection Time: 04/27/20  4:44 PM  Result Value Ref Range   Lactic Acid, Venous 2.4 (HH) 0.5 - 1.9 mmol/L    Comment: CRITICAL VALUE NOTED. VALUE IS CONSISTENT WITH PREVIOUSLY REPORTED/CALLED VALUE MJU Performed at [facility], [address] [place], [place] [address]   APTT     Status: Abnormal   Collection Time: 04/27/20  6:44 PM  Result Value Ref Range   aPTT 45 (H) 24 - 36 seconds    Comment:        IF BASELINE aPTT IS ELEVATED, SUGGEST PATIENT RISK  ASSESSMENT BE USED TO DETERMINE APPROPRIATE ANTICOAGULANT THERAPY. Performed at [facility], [address] [place], [place] [address]   Blood culture (routine x 2)     Status: None (Preliminary result)   Collection Time: 04/27/20  6:44 PM   Specimen: BLOOD  Result Value Ref Range   Specimen Description BLOOD PORTA CATH    Special Requests      BOTTLES DRAWN AEROBIC AND ANAEROBIC Blood Culture results may not be optimal due to an inadequate volume of blood received in culture bottles   Culture      NO GROWTH < 12 HOURS Performed at [facility], [address] [place], [place] [address]    Report Status PENDING   Blood culture (routine x 2)     Status: None (Preliminary result)   Collection Time: 04/27/20  6:44 PM   Specimen: BLOOD  Result Value Ref Range   Specimen Description BLOOD RIGHT ANTECUBITAL    Special Requests      BOTTLES DRAWN AEROBIC AND ANAEROBIC Blood Culture results may not be optimal due to an inadequate volume of blood received in culture bottles   Culture      NO GROWTH < 12 HOURS Performed at [facility], [address], [place], [place] [address]    Report Status PENDING   Lactic acid, plasma     Status: None   Collection Time: 04/27/20  6:44 PM  Result Value Ref Range   Lactic Acid, Venous 1.9 0.5 - 1.9 mmol/L    Comment: Performed at [facility], [address], [place], [place] [address]  Brain natriuretic peptide     Status: None   Collection Time: 04/27/20  6:44 PM  Result Value Ref Range   B Natriuretic Peptide 76.6 0.0 - 100.0 pg/mL    Comment: Performed at [facility], [address] [place],  [address]  Respiratory Panel by RT PCR (Flu A&B, Covid) - Nasopharyngeal Swab     Status: None   Collection Time: 04/27/20  6:57 PM   Specimen: Nasopharyngeal Swab  Result Value Ref Range   SARS Coronavirus 2 by RT PCR NEGATIVE NEGATIVE    Comment: (NOTE) SARS-CoV-2 target nucleic acids are NOT DETECTED.  The SARS-CoV-2 RNA is generally detectable in upper respiratoy specimens during the acute phase of infection. The lowest concentration of SARS-CoV-2 viral copies this assay can detect is 131 copies/mL. A negative result does not preclude SARS-Cov-2 infection and should not be used as the sole basis for treatment or other patient management decisions. A negative result may occur with  improper specimen collection/handling, submission of specimen other than nasopharyngeal swab, presence of viral mutation(s) within the areas targeted by this assay, and inadequate number of  viral copies (<131 copies/mL). A negative result must be combined with clinical observations, patient history, and epidemiological information. The expected result is Negative.  Fact Sheet for Patients:  PinkCheek.be  Fact Sheet for Healthcare Providers:  GravelBags.it  This test is no t yet approved or cleared by the [place] FDA and  has been authorized for detection and/or diagnosis of SARS-CoV-2 by FDA under an Emergency Use Authorization (EUA). This EUA will remain  in effect (meaning this test can be used) for the duration of the COVID-19 declaration under Section 564(b)(1) of the Act, 21 U.S.C. section 360bbb-3(b)(1), unless the authorization is terminated or revoked sooner.     Influenza A by PCR NEGATIVE NEGATIVE   Influenza B by PCR NEGATIVE NEGATIVE  Comment: (NOTE) The Xpert Xpress SARS-CoV-2/FLU/RSV assay is intended as an aid in  the diagnosis of influenza from Nasopharyngeal swab specimens and  should not be used as a sole basis for treatment. Nasal washings and  aspirates are unacceptable for Xpert Xpress SARS-CoV-2/FLU/RSV  testing.  Fact Sheet for Patients: PinkCheek.be  Fact Sheet for Healthcare Providers: GravelBags.it  This test is not yet approved or cleared by the [place] FDA and  has been authorized for detection and/or diagnosis of SARS-CoV-2 by  FDA under an Emergency Use Authorization (EUA). This EUA will remain  in effect (meaning this test can be used) for the duration of the  Covid-19 declaration under Section 564(b)(1) of the Act, 21  U.S.C. section 360bbb-3(b)(1), unless the authorization is  terminated or revoked. Performed at [facility], [address] [place], [place] [address]   Lactic acid, plasma     Status: None   Collection Time: 04/28/20  4:50 AM  Result Value Ref Range   Lactic Acid, Venous  1.7 0.5 - 1.9 mmol/L    Comment: Performed at [facility], [address] [place], [place] [address]  Protime-INR     Status: Abnormal   Collection Time: 04/28/20  4:50 AM  Result Value Ref Range   Prothrombin Time 17.3 (H) 11.4 - 15.2 seconds   INR 1.5 (H) 0.8 - 1.2    Comment: (NOTE) INR goal varies based on device and disease states. Performed at [facility], [address] [place], [place] [address]   Cortisol-am, blood     Status: None   Collection Time: 04/28/20  4:50 AM  Result Value Ref Range   Cortisol - AM 20.6 6.7 - 22.6 ug/dL    Comment: Performed at [facility] [address], [place],  [address]  Procalcitonin     Status: None   Collection Time: 04/28/20  4:50 AM  Result Value Ref Range   Procalcitonin 0.32 ng/mL    Comment:        Interpretation: PCT (Procalcitonin) <= 0.5 ng/mL: Systemic infection (sepsis) is not likely. Local bacterial infection is possible. (NOTE)       Sepsis PCT Algorithm           Lower Respiratory Tract                                      Infection PCT Algorithm    ----------------------------     ----------------------------         PCT < 0.25 ng/mL                PCT < 0.10 ng/mL          Strongly encourage             Strongly discourage   discontinuation of antibiotics    initiation of antibiotics    ----------------------------     -----------------------------       PCT 0.25 - 0.50 ng/mL            PCT 0.10 - 0.25 ng/mL               OR       >80% decrease in PCT            Discourage initiation of  antibiotics      Encourage discontinuation           of antibiotics    ----------------------------     -----------------------------         PCT >= 0.50 ng/mL              PCT 0.26 - 0.50 ng/mL               AND        <80% decrease in PCT             Encourage initiation of                                             antibiotics       Encourage  continuation           of antibiotics    ----------------------------     -----------------------------        PCT >= 0.50 ng/mL                  PCT > 0.50 ng/mL               AND         increase in PCT                  Strongly encourage                                      initiation of antibiotics    Strongly encourage escalation           of antibiotics                                     -----------------------------                                           PCT <= 0.25 ng/mL                                                 OR                                        > 80% decrease in PCT                                      Discontinue / Do not initiate                                             antibiotics  Performed at [facility], [address] [place], [place] [address]   Basic metabolic panel     Status: Abnormal   Collection  Time: 04/28/20  4:50 AM  Result Value Ref Range   Sodium 132 (L) 135 - 145 mmol/L   Potassium 4.6 3.5 - 5.1 mmol/L   Chloride 103 98 - 111 mmol/L   CO2 19 (L) 22 - 32 mmol/L   Glucose, Bld 81 70 - 99 mg/dL    Comment: Glucose reference range applies only to samples taken after fasting for at least 8 hours.   BUN 17 8 - 23 mg/dL   Creatinine, Ser 0.71 0.44 - 1.00 mg/dL   Calcium 7.5 (L) 8.9 - 10.3 mg/dL   GFR, Estimated >60 >60 mL/min   Anion gap 10 5 - 15    Comment: Performed at [facility], [address] [place], [place] [address]  CBC     Status: Abnormal   Collection Time: 04/28/20  4:50 AM  Result Value Ref Range   WBC 8.9 4.0 - 10.5 K/uL   RBC 3.30 (L) 3.87 - 5.11 MIL/uL   Hemoglobin 8.2 (L) 12.0 - 15.0 g/dL   HCT 27.3 (L) 36 - 46 %   MCV 82.7 80.0 - 100.0 fL   MCH 24.8 (L) 26.0 - 34.0 pg   MCHC 30.0 30.0 - 36.0 g/dL   RDW 20.3 (H) 11.5 - 15.5 %   Platelets 216 150 - 400 K/uL   nRBC 0.0 0.0 - 0.2 %    Comment: Performed at [facility], [address], [place], [place] [address]  Basic  metabolic panel     Status: Abnormal   Collection Time: 04/29/20  4:15 AM  Result Value Ref Range   Sodium 135 135 - 145 mmol/L   Potassium 4.4 3.5 - 5.1 mmol/L   Chloride 108 98 - 111 mmol/L   CO2 16 (L) 22 - 32 mmol/L   Glucose, Bld 74 70 - 99 mg/dL    Comment: Glucose reference range applies only to samples taken after fasting for at least 8 hours.   BUN 15 8 - 23 mg/dL   Creatinine, Ser 0.76 0.44 - 1.00 mg/dL   Calcium 7.6 (L) 8.9 - 10.3 mg/dL   GFR, Estimated >60 >60 mL/min    Comment: (NOTE) Calculated using the CKD-EPI Creatinine Equation (2021)    Anion gap 11 5 - 15    Comment: Performed at [facility], [address] [place], [place] [address]  CBC     Status: Abnormal   Collection Time: 04/29/20  4:15 AM  Result Value Ref Range   WBC 8.5 4.0 - 10.5 K/uL   RBC 3.10 (L) 3.87 - 5.11 MIL/uL   Hemoglobin 7.9 (L) 12.0 - 15.0 g/dL   HCT 26.3 (L) 36 - 46 %   MCV 84.8 80.0 - 100.0 fL   MCH 25.5 (L) 26.0 - 34.0 pg   MCHC 30.0 30.0 - 36.0 g/dL   RDW 20.8 (H) 11.5 - 15.5 %   Platelets 203 150 - 400 K/uL   nRBC 0.0 0.0 - 0.2 %    Comment: Performed at [facility], [address], [place], [place] [address]  Phosphorus     Status: None   Collection Time: 04/29/20  4:15 AM  Result Value Ref Range   Phosphorus 2.8 2.5 - 4.6 mg/dL    Comment: Performed at [facility], [address], [place], [place] [address]  Magnesium     Status: None   Collection Time: 04/29/20  4:15 AM  Result Value Ref Range   Magnesium 2.1 1.7 -  2.4 mg/dL    Comment: Performed at [facility], [address] [place], [place] [address]   DG Chest 2 View  Result Date: 04/27/2020 CLINICAL DATA:  Altered mental status EXAM: CHEST - 2 VIEW COMPARISON:  04/20/2020, 03/03/2020 FINDINGS: Right-sided Port-A-Cath remains in place. Stable heart size. Atherosclerotic calcification of the aortic knob. Scattered nodular densities most prevalent within the left lung base,  better characterized on recent CT. Small bilateral pleural effusions with associated streaky bibasilar opacities, likely atelectasis. No pneumothorax. IMPRESSION: Small bilateral pleural effusions with associated streaky bibasilar opacities, likely atelectasis. Electronically Signed   By: Davina Poke D.O.   On: 04/27/2020 16:49   CT Head Wo Contrast  Result Date: 04/27/2020 CLINICAL DATA:  Mental status change, unknown cause. EXAM: CT HEAD WITHOUT CONTRAST TECHNIQUE: Contiguous axial images were obtained from the base of the skull through the vertex without intravenous contrast. COMPARISON:  MRI 04/21/2020, CT head 04/20/2020. FINDINGS: Brain: No evidence of acute large vascular territory infarction, hemorrhage, hydrocephalus, extra-axial collection or mass lesion/mass effect. Patchy white matter hypoattenuation appears similar to prior and most likely represents chronic microvascular ischemic disease. Vascular: Calcific atherosclerosis. Skull: Similar left frontal scalp lesion near the vertex and smaller nodule at the midline anteriorly. No acute fracture. Sinuses/Orbits: Clear sinuses.  Unremarkable orbits. Other: No mastoid effusions. IMPRESSION: 1. No evidence of acute intracranial abnormality. 2. 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 3. Similar left frontal scalp lesion near the vertex and smaller nodule at the midline anteriorly. Electronically Signed   By: Margaretha Sheffield MD   On: 04/27/2020 13:59    Assessment:  The patient is a 74 y.o. woman with metastatic renal cell carcinoma admitted with altered mental status and a possible UTI.    She was admitted to [facility] from 04/20/2020 - 04/23/2020 for somnolence, hypertension, and electrolyte abnormalities (sodium 129 and potassium 5.5).  Corrected calcium was 9.5.  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 EEG on 04/22/2020 was abnormal secondary to the presence of frequent periods of slowing that are more  prominent frontally (FIRDA) and triphasic waves. These findings may be seen with diffusecerebraldisturbance that is etiologically nonspecific, but may include a metabolic encephalopathy, among other possibilities.TEE revealed no thrombus or PFO.  Carotid ultrasound on 04/23/2020 revealed no significant plaque formation. 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  She was discharged on aspirin and Plavix.  She has a history of hypercalemia.  She is on monthly Zometa.  Corrected calcium is normal.   Symptomatically, she is delirious.  She has had no new medications; she has not been taking pain medications.  Plan:   1.  Metastatic renal cell carcinoma             Patient is day 22 s/p cycle #1 pembrolizumab.             Patient began axitinib on 04/15/2020 (last 04/19/2020).             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 2.   Delerium             Patient was at her baseline health early on 04/26/2020.             She has not taken any pain medications.  Sodium was low on admission.  She may have a UTI.  Lactic acid was slightly elevated.  Ammonia is normal.  Calcium is normal (corrected  9.1).  Folate was normal on 04/26/2020; vitamin B12 was 2351 on 03/06/2020.  Family notes patient not sleeping.  Repeat head MRI to ensure no further CVAs.  Check vitamin B1, B12, paraneoplastic panel.              Etiology likely multi-factorial.  Neurology consultation. 3.   Hyponatremia             Sodium 128 on admission and 132 today.             No evidence of adrenal insufifciency (cortisol was 20.6 with AM labs).  Continue IVF.  Hyponatremia may be contributing to altered mental status. 4.   Normocytic anemia  Hematocrit 27.3.  Hemoglobin 8.2.  MCV 82.7.  Ferritin was 660 with an iron saturation of 11% and a TIBC 150 on 03/06/2020.  Folate was  4.8 on 03/06/2020 and 22.0 on 04/26/2020 after supplimentation.  Vitamin B12 was 2351 on 03/06/2020.  Recheck level.  TSH was 11.410 (high) with a free T4 of 0.84 (normal) on 04/23/2020.  Etiology felt secondary to renal cell carcinoma.  No evidence of bleeding.  Maintain type and screen. 5.   Possible UTI  Patient on ceftriaxone.  Unclear if contributing, but would treat as etiology of altered mental status may be multi-factorial.  Thank you for allowing me to participate in JESSLY LEBECK 's care.  I will follow her closely with you while hospitalized and after discharge in the outpatient department.   Lequita Asal, MD  04/29/2020, 6:08 AM

## 2020-04-30 DIAGNOSIS — I10 Essential (primary) hypertension: Secondary | ICD-10-CM | POA: Diagnosis not present

## 2020-04-30 DIAGNOSIS — R4182 Altered mental status, unspecified: Secondary | ICD-10-CM | POA: Diagnosis not present

## 2020-04-30 DIAGNOSIS — C7951 Secondary malignant neoplasm of bone: Secondary | ICD-10-CM | POA: Diagnosis not present

## 2020-04-30 DIAGNOSIS — C649 Malignant neoplasm of unspecified kidney, except renal pelvis: Secondary | ICD-10-CM | POA: Diagnosis not present

## 2020-04-30 DIAGNOSIS — R443 Hallucinations, unspecified: Secondary | ICD-10-CM | POA: Diagnosis not present

## 2020-04-30 LAB — CBC
HCT: 25.7 % — ABNORMAL LOW (ref 36.0–46.0)
Hemoglobin: 7.7 g/dL — ABNORMAL LOW (ref 12.0–15.0)
MCH: 25.5 pg — ABNORMAL LOW (ref 26.0–34.0)
MCHC: 30 g/dL (ref 30.0–36.0)
MCV: 85.1 fL (ref 80.0–100.0)
Platelets: 203 10*3/uL (ref 150–400)
RBC: 3.02 MIL/uL — ABNORMAL LOW (ref 3.87–5.11)
RDW: 21.2 % — ABNORMAL HIGH (ref 11.5–15.5)
WBC: 7.3 10*3/uL (ref 4.0–10.5)
nRBC: 0 % (ref 0.0–0.2)

## 2020-04-30 LAB — BASIC METABOLIC PANEL
Anion gap: 10 (ref 5–15)
BUN: 15 mg/dL (ref 8–23)
CO2: 16 mmol/L — ABNORMAL LOW (ref 22–32)
Calcium: 7.6 mg/dL — ABNORMAL LOW (ref 8.9–10.3)
Chloride: 111 mmol/L (ref 98–111)
Creatinine, Ser: 0.78 mg/dL (ref 0.44–1.00)
GFR, Estimated: >60 mL/min (ref >60)
Glucose, Bld: 115 mg/dL — ABNORMAL HIGH (ref 70–99)
Potassium: 4.1 mmol/L (ref 3.5–5.1)
Sodium: 137 mmol/L (ref 135–145)

## 2020-04-30 LAB — MAGNESIUM: Magnesium: 2.1 mg/dL (ref 1.7–2.4)

## 2020-04-30 LAB — RPR: RPR Ser Ql: NONREACTIVE

## 2020-04-30 MED ORDER — ALBUTEROL SULFATE (2.5 MG/3ML) 0.083% IN NEBU
2.5000 mg | INHALATION_SOLUTION | Freq: Four times a day (QID) | RESPIRATORY_TRACT | Status: DC | PRN
  Administered 2020-04-30: 14:00:00 2.5 mg via RESPIRATORY_TRACT
  Filled 2020-04-30: qty 3

## 2020-04-30 MED ORDER — HALOPERIDOL 5 MG PO TABS
5.0000 mg | ORAL_TABLET | Freq: Four times a day (QID) | ORAL | Status: DC | PRN
  Filled 2020-04-30: qty 1

## 2020-04-30 MED ORDER — MORPHINE SULFATE (PF) 2 MG/ML IV SOLN
2.0000 mg | INTRAVENOUS | Status: DC | PRN
  Administered 2020-04-30 – 2020-05-03 (×3): 2 mg via INTRAVENOUS
  Filled 2020-04-30 (×3): qty 1

## 2020-04-30 MED ORDER — LORAZEPAM 2 MG/ML IJ SOLN
1.0000 mg | INTRAMUSCULAR | Status: DC | PRN
  Administered 2020-04-30: 16:00:00 1 mg via INTRAVENOUS
  Filled 2020-04-30: qty 1

## 2020-04-30 NOTE — Progress Notes (Signed)
Pt was given morphine for air hunger at this time.  Family at bedside.

## 2020-04-30 NOTE — Progress Notes (Signed)
Chaplain On-Call responded to Order Requisition for support for patient's husband at bedside.  Chaplain met [employer] and stayed at bedside with her while husband and friend left for conference with physician. Patient was restless and Chaplain was unable to engage in conversation with her.  Later, this Chaplain conferred with RN Stevie Kern and also met with patient's husband and friend Juliann Pulse. Chaplain provided spiritual and emotional support and prayer.  Lemay Juandaniel Manfredo M.Div., [facility]

## 2020-04-30 NOTE — Progress Notes (Signed)
Paged RT for nebulizer treatment due to pt's chest is very tight and audibly wheezing from the door of her room.  Bedside safety sitter also expressed concern for labored breathing

## 2020-04-30 NOTE — Significant Event (Addendum)
Upon request by the family, I had a prolonged discussion with the patient's husband Mariah Thornton,, patient's relative Ms. Mariah Thornton.  I have had multiple conversations with Mariah Thornton, patient's relative on several occasions including this morning as well.  Family strongly wished to to focus on comfort and symptomatic care for the patient.  Family is aware of the ongoing cognitive issues on the background of metastatic cancer.    I have confirmed the family wishes and at this time patient will be transitioned to comfort care.  Goals of comfort care were discussed with the family in detail.  Family does not want her care to be escalated including further blood works scans and so forth.  At this time, full focus will be on comfort.  We will also get transitional care team involved for hospice transition if necessary.  We will initiate the patient on as needed Ativan and morphine as necessary for anxiety and pain.  Patient's  Nurse and chaplain also present during this visit. Spent 32 mins in coordinating care, discussion of care with family , nursing staff and chaplain.

## 2020-04-30 NOTE — Progress Notes (Signed)
OT Cancellation Note  Patient Details Name: Mariah Thornton MRN: [id number] DOB: 1945/07/14   Cancelled Treatment:    Reason Eval/Treat Not Completed: Patient not medically ready. Per RN patient not appropriate for evaluation at this time. Labored breathing and receiving nebulizer treatment. Will continue to follow and initiate services at later date/time.   Dessie Coma, M.S. OTR/L  04/30/20, 3:24 PM  ascom [phone]

## 2020-04-30 NOTE — Progress Notes (Signed)
Doctor and chaplain have been paged per family request for end of life decisions for this patient.

## 2020-04-30 NOTE — Progress Notes (Signed)
PT Cancellation Note  Patient Details Name: KINSLEI LABINE MRN: [id number] DOB: [birth year]   Cancelled Treatment:    Reason Eval/Treat Not Completed: Medical issues which prohibited therapy Per RN patient not appropriate for evaluation at this time. Labored breathing and receiving nebulizer treatment.   Durwin Reges DPT Durwin Reges 04/30/2020, 3:07 PM

## 2020-04-30 NOTE — Progress Notes (Addendum)
Subjective: Wheezing earlier today. Has been transitioned to comfort care.   Objective: Current vital signs: BP 138/64 (BP Location: Left Arm)   Pulse 96   Temp 97.7 F (36.5 C)   Resp 18   Ht 5' 2"  (1.575 m)   Wt 70.1 kg   SpO2 94%   BMI 28.28 kg/m  Vital signs in last 24 hours: Temp:  [97.7 F (36.5 C)] 97.7 F (36.5 C) (10/22 1239) Pulse Rate:  [75-96] 96 (10/22 2051) Resp:  [15-18] 18 (10/22 2051) BP: (104-138)/(64-65) 138/64 (10/22 2051) SpO2:  [94 %-95 %] 94 % (10/22 2051) Weight:  [70.1 kg] 70.1 kg (10/22 0900)  Intake/Output from previous day: 10/22 0701 - 10/23 0700 In: 100 [IV Piggyback:100] Out: 150 [Urine:150] Intake/Output this shift: No intake/output data recorded. Nutritional status:  Diet Order            DIET - DYS 1 Room service appropriate? Yes with Assist; Fluid consistency: Thin  Diet effective now                 Neurologic Exam: (6:00 PM) Sleeping on sedation.  Deferred remainder of exam due to transition to comfort measures.   Lab Results: Results for orders placed or performed during the hospital encounter of 04/27/20 (from the past 48 hour(s))  Basic metabolic panel     Status: Abnormal   Collection Time: 04/29/20  4:15 AM  Result Value Ref Range   Sodium 135 135 - 145 mmol/L   Potassium 4.4 3.5 - 5.1 mmol/L   Chloride 108 98 - 111 mmol/L   CO2 16 (L) 22 - 32 mmol/L   Glucose, Bld 74 70 - 99 mg/dL    Comment: Glucose reference range applies only to samples taken after fasting for at least 8 hours.   BUN 15 8 - 23 mg/dL   Creatinine, Ser 0.76 0.44 - 1.00 mg/dL   Calcium 7.6 (L) 8.9 - 10.3 mg/dL   GFR, Estimated >60 >60 mL/min    Comment: (NOTE) Calculated using the CKD-EPI Creatinine Equation (2021)    Anion gap 11 5 - 15    Comment: Performed at [facility], [address] [place], [place] [address]  CBC     Status: Abnormal   Collection Time: 04/29/20  4:15 AM  Result Value Ref Range   WBC 8.5 4.0 - 10.5 K/uL    RBC 3.10 (L) 3.87 - 5.11 MIL/uL   Hemoglobin 7.9 (L) 12.0 - 15.0 g/dL   HCT 26.3 (L) 36 - 46 %   MCV 84.8 80.0 - 100.0 fL   MCH 25.5 (L) 26.0 - 34.0 pg   MCHC 30.0 30.0 - 36.0 g/dL   RDW 20.8 (H) 11.5 - 15.5 %   Platelets 203 150 - 400 K/uL   nRBC 0.0 0.0 - 0.2 %    Comment: Performed at [facility], [address], [place], [place] [address]  Phosphorus     Status: None   Collection Time: 04/29/20  4:15 AM  Result Value Ref Range   Phosphorus 2.8 2.5 - 4.6 mg/dL    Comment: Performed at [facility], [address], [place], [place] [address]  Magnesium     Status: None   Collection Time: 04/29/20  4:15 AM  Result Value Ref Range   Magnesium 2.1 1.7 - 2.4 mg/dL    Comment: Performed at [facility], [address], [place], [place] [address]  Vitamin B12     Status: Abnormal  Collection Time: 04/29/20  8:11 AM  Result Value Ref Range   Vitamin B-12 5,216 (H) 180 - 914 pg/mL    Comment: (NOTE) This assay is not validated for testing neonatal or myeloproliferative syndrome specimens for Vitamin B12 levels. Performed at [facility], [place] [address], [place], [place] [address]   Sedimentation rate     Status: Abnormal   Collection Time: 04/29/20  8:11 AM  Result Value Ref Range   Sed Rate 60 (H) 0 - 30 mm/hr    Comment: Performed at [facility], [address] [place], [place] [address]  C-reactive protein     Status: Abnormal   Collection Time: 04/29/20  8:11 AM  Result Value Ref Range   CRP 10.5 (H) <1.0 mg/dL    Comment: Performed at [facility], [place] [address] [place], [place] [address]  Type and screen [place]     Status: None   Collection Time: 04/29/20  8:11 AM  Result Value Ref Range   ABO/RH(D) O POS    Antibody Screen NEG    Sample Expiration      05/02/2020,2359 Performed at [facility], [address] [place], [place] [address]   Basic metabolic panel     Status:  Abnormal   Collection Time: 04/30/20  4:40 AM  Result Value Ref Range   Sodium 137 135 - 145 mmol/L   Potassium 4.1 3.5 - 5.1 mmol/L   Chloride 111 98 - 111 mmol/L   CO2 16 (L) 22 - 32 mmol/L   Glucose, Bld 115 (H) 70 - 99 mg/dL    Comment: Glucose reference range applies only to samples taken after fasting for at least 8 hours.   BUN 15 8 - 23 mg/dL   Creatinine, Ser 0.78 0.44 - 1.00 mg/dL   Calcium 7.6 (L) 8.9 - 10.3 mg/dL   GFR, Estimated >60 >60 mL/min    Comment: (NOTE) Calculated using the CKD-EPI Creatinine Equation (2021)    Anion gap 10 5 - 15    Comment: Performed at [facility], [address] [place], [place] [address]  CBC     Status: Abnormal   Collection Time: 04/30/20  4:40 AM  Result Value Ref Range   WBC 7.3 4.0 - 10.5 K/uL   RBC 3.02 (L) 3.87 - 5.11 MIL/uL   Hemoglobin 7.7 (L) 12.0 - 15.0 g/dL   HCT 25.7 (L) 36 - 46 %   MCV 85.1 80.0 - 100.0 fL   MCH 25.5 (L) 26.0 - 34.0 pg   MCHC 30.0 30.0 - 36.0 g/dL   RDW 21.2 (H) 11.5 - 15.5 %   Platelets 203 150 - 400 K/uL   nRBC 0.0 0.0 - 0.2 %    Comment: Performed at [facility], [address] [place], [place] [address]  Magnesium     Status: None   Collection Time: 04/30/20  4:40 AM  Result Value Ref Range   Magnesium 2.1 1.7 - 2.4 mg/dL    Comment: Performed at [facility], [address] [place], [place] [address]    Recent Results (from the past 240 hour(s))  Respiratory Panel by RT PCR (Flu A&B, Covid) - Nasopharyngeal Swab     Status: None   Collection Time: 04/21/20  7:40 AM   Specimen: Nasopharyngeal Swab  Result Value Ref Range Status   SARS Coronavirus 2 by RT PCR NEGATIVE NEGATIVE Final    Comment: (NOTE) SARS-CoV-2 target  nucleic acids are NOT DETECTED.  The SARS-CoV-2 RNA is generally detectable in upper respiratoy specimens during the acute phase of infection. The lowest concentration of SARS-CoV-2 viral copies this assay can detect is 131 copies/mL. A  negative result does not preclude SARS-Cov-2 infection and should not be used as the sole basis for treatment or other patient management decisions. A negative result may occur with  improper specimen collection/handling, submission of specimen other than nasopharyngeal swab, presence of viral mutation(s) within the areas targeted by this assay, and inadequate number of viral copies (<131 copies/mL). A negative result must be combined with clinical observations, patient history, and epidemiological information. The expected result is Negative.  Fact Sheet for Patients:  PinkCheek.be  Fact Sheet for Healthcare Providers:  GravelBags.it  This test is no t yet approved or cleared by the [place] FDA and  has been authorized for detection and/or diagnosis of SARS-CoV-2 by FDA under an Emergency Use Authorization (EUA). This EUA will remain  in effect (meaning this test can be used) for the duration of the COVID-19 declaration under Section 564(b)(1) of the Act, 21 U.S.C. section 360bbb-3(b)(1), unless the authorization is terminated or revoked sooner.     Influenza A by PCR NEGATIVE NEGATIVE Final   Influenza B by PCR NEGATIVE NEGATIVE Final    Comment: (NOTE) The Xpert Xpress SARS-CoV-2/FLU/RSV assay is intended as an aid in  the diagnosis of influenza from Nasopharyngeal swab specimens and  should not be used as a sole basis for treatment. Nasal washings and  aspirates are unacceptable for Xpert Xpress SARS-CoV-2/FLU/RSV  testing.  Fact Sheet for Patients: PinkCheek.be  Fact Sheet for Healthcare Providers: GravelBags.it  This test is not yet approved or cleared by the [place] FDA and  has been authorized for detection and/or diagnosis of SARS-CoV-2 by  FDA under an Emergency Use Authorization (EUA). This EUA will remain  in effect (meaning this test can  be used) for the duration of the  Covid-19 declaration under Section 564(b)(1) of the Act, 21  U.S.C. section 360bbb-3(b)(1), unless the authorization is  terminated or revoked. Performed at [facility], [address], [place], [place] [address]   Urine Culture     Status: Abnormal   Collection Time: 04/27/20  9:23 AM   Specimen: Urine, Random  Result Value Ref Range Status   Specimen Description   Final    URINE, RANDOM Performed at [facility], [address] [place], [place] [address]    Special Requests   Final    NONE Performed at [facility], [address] [place], [place] [address]    Culture 20,000 COLONIES/mL ENTEROCOCCUS FAECALIS (A)  Final   Report Status 04/29/2020 FINAL  Final   Organism ID, Bacteria ENTEROCOCCUS FAECALIS (A)  Final      Susceptibility   Enterococcus faecalis - MIC*    AMPICILLIN <=2 SENSITIVE Sensitive     NITROFURANTOIN <=16 SENSITIVE Sensitive     VANCOMYCIN 1 SENSITIVE Sensitive     * 20,000 COLONIES/mL ENTEROCOCCUS FAECALIS  Blood culture (routine x 2)     Status: None (Preliminary result)   Collection Time: 04/27/20  6:44 PM   Specimen: BLOOD  Result Value Ref Range Status   Specimen Description BLOOD PORTA CATH  Final   Special Requests   Final    BOTTLES DRAWN AEROBIC AND ANAEROBIC Blood Culture results may not be optimal due to an inadequate volume of blood received in culture bottles   Culture  Final    NO GROWTH 3 DAYS Performed at [facility], [address] [place], [place] [address]    Report Status PENDING  Incomplete  Blood culture (routine x 2)     Status: None (Preliminary result)   Collection Time: 04/27/20  6:44 PM   Specimen: BLOOD  Result Value Ref Range Status   Specimen Description BLOOD RIGHT ANTECUBITAL  Final   Special Requests   Final    BOTTLES DRAWN AEROBIC AND ANAEROBIC Blood Culture results may not be optimal due to an inadequate volume of blood received in  culture bottles   Culture   Final    NO GROWTH 3 DAYS Performed at [facility], [address] [place], [place] [address]    Report Status PENDING  Incomplete  Respiratory Panel by RT PCR (Flu A&B, Covid) - Nasopharyngeal Swab     Status: None   Collection Time: 04/27/20  6:57 PM   Specimen: Nasopharyngeal Swab  Result Value Ref Range Status   SARS Coronavirus 2 by RT PCR NEGATIVE NEGATIVE Final    Comment: (NOTE) SARS-CoV-2 target nucleic acids are NOT DETECTED.  The SARS-CoV-2 RNA is generally detectable in upper respiratoy specimens during the acute phase of infection. The lowest concentration of SARS-CoV-2 viral copies this assay can detect is 131 copies/mL. A negative result does not preclude SARS-Cov-2 infection and should not be used as the sole basis for treatment or other patient management decisions. A negative result may occur with  improper specimen collection/handling, submission of specimen other than nasopharyngeal swab, presence of viral mutation(s) within the areas targeted by this assay, and inadequate number of viral copies (<131 copies/mL). A negative result must be combined with clinical observations, patient history, and epidemiological information. The expected result is Negative.  Fact Sheet for Patients:  PinkCheek.be  Fact Sheet for Healthcare Providers:  GravelBags.it  This test is no t yet approved or cleared by the [place] FDA and  has been authorized for detection and/or diagnosis of SARS-CoV-2 by FDA under an Emergency Use Authorization (EUA). This EUA will remain  in effect (meaning this test can be used) for the duration of the COVID-19 declaration under Section 564(b)(1) of the Act, 21 U.S.C. section 360bbb-3(b)(1), unless the authorization is terminated or revoked sooner.     Influenza A by PCR NEGATIVE NEGATIVE Final   Influenza B by PCR NEGATIVE NEGATIVE Final     Comment: (NOTE) The Xpert Xpress SARS-CoV-2/FLU/RSV assay is intended as an aid in  the diagnosis of influenza from Nasopharyngeal swab specimens and  should not be used as a sole basis for treatment. Nasal washings and  aspirates are unacceptable for Xpert Xpress SARS-CoV-2/FLU/RSV  testing.  Fact Sheet for Patients: PinkCheek.be  Fact Sheet for Healthcare Providers: GravelBags.it  This test is not yet approved or cleared by the [place] FDA and  has been authorized for detection and/or diagnosis of SARS-CoV-2 by  FDA under an Emergency Use Authorization (EUA). This EUA will remain  in effect (meaning this test can be used) for the duration of the  Covid-19 declaration under Section 564(b)(1) of the Act, 21  U.S.C. section 360bbb-3(b)(1), unless the authorization is  terminated or revoked. Performed at [facility], [address] [place],  [address]     Lipid Panel No results for input(s): CHOL, TRIG, HDL, CHOLHDL, VLDL, LDLCALC in the last 72 hours.  Studies/Results: MR BRAIN W WO CONTRAST  Result Date: 04/29/2020 CLINICAL DATA:  Metastatic disease  evaluation. EXAM: MRI HEAD WITHOUT AND WITH CONTRAST TECHNIQUE: Multiplanar, multiecho pulse sequences of the brain and surrounding structures were obtained without and with intravenous contrast. CONTRAST:  24m GADAVIST GADOBUTROL 1 MMOL/ML IV SOLN COMPARISON:  04/21/2020 MRIs of the head and prior. 04/27/2020 head CT. FINDINGS: Brain: Mild cerebral atrophy with ex vacuo dilatation. Advanced chronic microvascular ischemic changes. Chronic right thalamic insult. Scattered foci of DWI hyperintensity involving the right frontal, left parietal and bilateral occipital regions, some of which demonstrate ADC correlate are new. Prior foci of restricted diffusion are not demonstrated on the current exam. Tiny focus of SWI signal dropout involving the right occipital  lobe is unchanged. No midline shift, ventriculomegaly or extra-axial fluid collection. No abnormal enhancement. Please note that image quality is degraded by motion artifact. Vascular: Proximally preserved major intracranial flow voids. Skull and upper cervical spine: No focal osseous lesion. Sinuses/Orbits: Sequela of bilateral lens replacement. Pneumatized paranasal sinuses and mastoid air cells. Other: 0.9 cm anterior midline enhancing scalp nodule is unchanged (12:26). Redemonstration of 3.0 cm enhancing left parietal scalp mass (12:25). Enhancing 3 mm left temporal focus (12:17) not demonstrated on prior exam. IMPRESSION: Scattered foci of DWI hyperintensity involving the right frontal, left parietal and bilateral occipital regions are suspicious for acute/subacute infarcts. No correlated of enhancement or SWI signal dropout. 3 mm enhancing left temporal lesion is new. Differential includes small metastasis versus vascular in origin. Prior foci of restricted diffusion are not demonstrated on the current exam. Advanced chronic microvascular ischemic changes. Grossly unchanged appearance of enhancing anterior midline and left parietal scalp lesions. These results will be called to the ordering clinician or representative by the Radiologist Assistant, and communication documented in the PACS or [employer] Electronically Signed   By: CPrimitivo GauzeM.D.   On: 04/29/2020 11:26    Medications:  Scheduled: . amLODipine  10 mg Oral Daily  . aspirin EC  81 mg Oral Daily  . Chlorhexidine Gluconate Cloth  6 each Topical Daily  . clopidogrel  75 mg Oral Daily  . enoxaparin (LOVENOX) injection  40 mg Subcutaneous Q24H  . folic acid  1 mg Oral Daily   Continuous: . sodium chloride 75 mL/hr at 04/29/20 1646  . ampicillin (OMNIPEN) IV 1 g (04/30/20 0232)    Follow up MRI (10/22): - Scattered foci of DWI hyperintensity involving the right frontal, left parietal and bilateral occipital regions are  suspicious for acute/subacute infarcts. No correlated of enhancement or SWI signal dropout. - 3 mm enhancing left temporal (Note: should read temporalis muscle) lesion is new. Differential includes small metastasis versus vascular in origin. - Prior foci of restricted diffusion are not demonstrated on the current exam. Advanced chronic microvascular ischemic changes. - Grossly unchanged appearance of enhancing anterior midline and left parietal scalp lesions.  Initial assessment and plan prior to family decision today to transition to comfort care: Assessment: Mariah Thornton with a PMHx of stage IV renal cell carcinoma, HTN, CVA, chronic anemia,recently hospitalized from 10/13-10/16 with excessive somnolence and seen by Neurology at that time with diagnosis of multifactorial etiology including small infarctson MRI brain in conjunction with pain medications and dehydration from poor oral intake who represents to the ED today after she endorsed having hallucinationsconsisting of seeing animals around her at her Oncology clinic appointment today.[facility] denied headache, visual disturbance or one-sided weakness, numbness or tingling. Abnormal labs included elevated lactic acid of 2.2 and sodium of 128. The patient has been started on  IV fluids and Rocephin for possible sepsis secondary to UTI. 1. Neurological exam reveals a delirious patient who is having formed visual hallucinations of cats. No focal weakness, facial droop or sensory loss is noted. Although confused and disoriented, she is not aphasic.  2. CT head: . No evidence of acute intracranial abnormality. Chronic microvascular ischemic disease and generalized atrophy. Similar left frontal scalp lesion near the vertex and smaller nodule at the midline anteriorly. 3. Recent B12 level was elevated in August.  4. TSH elevated on 10/16, but free T4 was normal 5. AST, ALT and ammonia levels are normal.  6.  Corrected Ca level is normal at 9.2 7. Encephalopathy is most likely multifactorial. See below for recommendations.  8. EEG on 10/15 showed frontal intermittent rhythmic delta slowing (FIRDA) which is etiologically nonspecific but likely was secondary to a metabolic encephalopathy.  9. Repeat MRI obtained yesterday reveals several punctate acute to subacute ischemic infarctions scattered within separate vascular territories that are new since the 10/14 MRI examination. The infarcts are suggestive of a cardioembolic etiology. Although no thrombus was seen on prior TEE, a small focus of foci of intracardiac thrombus not detectable by echocardiography would be a potential source for the recurrent strokes.   10. Note: The official Radiology report describes a new enhancing left "temporal" lesion. This should be corrected to "temporalis muscle lesion".  11. TEE last admission showed no thrombus in the left atrium or left atrial appendage. No need to repeat cardiac ultrasound this admission.   Recommendations: 1. The patient's recurrent strokes appear most likely to be due to microemboli. In that context, a cardiac source would be most likely. The patient also is most likely hypercoagulable due to her cancer. Would discuss with Cardiology the risks/benefits of switching the patient from DAPT to anticoagulation from their standpoint. From a Neurology standpoint, it is felt that the overall benefits of anticoagulation for secondary stroke prevention may outweigh the risks.  2. For now, continue the Plavix and ASA x 3 weeks then discontinue Plavix while continuing ASA as per Neurology follow up note on 10/16 from her last admission.   3. Obtain RPR, ESR and c-reactive protein 4. Limit medications with anticholinergic side effects 5. Agree with holding her Norco.  6. Obtain a thiamine level.   Addendum: - Now on comfort care. Sedated.  - Obtaining labs and the consult recommended above is no longer indicated.   - Neurology will sign off. Please call if there are additional questions.    LOS: 2 days   @Electronically  signed: Dr. Kerney Elbe 04/30/2020  7:58 AM

## 2020-04-30 NOTE — Progress Notes (Addendum)
PROGRESS NOTE  Mariah Thornton FTD:[id number] DOB: 1945/12/27 DOA: 04/27/2020 PCP: Philmore Pali, NP   LOS: 2 days   Brief narrative: As per HPI,  Mariah Thornton is a 74 y.o. female with medical history significant for stage IV renal cell carcinoma (followed by Dr. Mike Gip) s/p1 cycle of Keytruda on 04/07/2020 and onoral Inlyta as well as history of HTN, CVA, chronic anemia, recently hospitalized from 10/13-10/16 with excessive somnolence, multifactorial etiology including small infarcts seen on MRI brain, pain medications and dehydration from poor oral intake who was sent by the oncology clinic for admission due to complaints of hallucinations consisting of seeing animals around her.  Patient denied headache, visual disturbance or one-sided weakness numbness or tingling. On arrival to the ED, vitals were within normal limits with somewhat soft blood pressure of 117/51.  Blood work mostly notable for hemoglobin of 8.6, which is about her baseline, normal WBC of 10.3 but with elevated lactic acid of 2.2, sodium 128.  Urinalysis showed moderate leukocyte esterase. EKG showed  normal sinus rhythm with no acute ST-T wave changes. Patient was started on IV fluids, Rocephin.  Hospitalist consulted for admission for possible sepsis secondary to UTI with hallucinations and agitation.  Assessment/Plan:  Principal Problem:   Hallucinations Active Problems:   Bone metastases (HCC)   Hypertension   UTI (urinary tract infection)   Metastatic renal cell carcinoma (HCC)   Hypotension   Metabolic encephalopathy  Hallucinations, metabolic encephalopathy with delirium Neurology and oncology on board.  Patient did have MRI of the head which showed multiple areas of infarct as before plus new 3 mm lesion in the temporal lobe.  CT head scan showed no acute intracranial findings. Random cortisol was 20.0. Procalcitonin 0.3. WBC 8.9.  Sodium level 135.Marland Kitchen  Blood cultures negative in 3 days.  COVID-19 and flu was  negative. Urine drug screen was negative. Ammonia was 24. Liver function test within normal limits. Urinalysis was mildly abnormal, urine culture with insignificant colonies of Enterococcus.  CRP was mildly elevated.  Vitamin B12 5000.  Sed rate mild elevated at 60. RPR, , vitamin B1 pending.   palliative care on board. consult.  Palliative care on board and plan requesting current level of care.   Volume depletion. improved IV fluid hydration.  Continue oral hydration at this time.  Enterococcus urine tract infection   No leukocytosis or fever but lactate was elevated. Responded to IV fluids.  Urine culture with insignificant  colonies of Enterococcus currently.  Antibiotic has been changed to penicillin.  Blood cultures negative in 3 days.  Recent embolic CVA Continue aspirin and Plavix.   CT head scan was negative for bleeding.  We will continue aspirin alone after 3 weeks of dual antiplatelets.  Communicated with neurology for further input on the MRI findings.  Folic acid deficiency. Continue folate.   Metastatic renal cell carcinoma (HCC) to bone and lung As per oncology.  Patient presented with confusion, disorientation agitation and hallucination    Patient has received [facility] as [facility].  Seen by oncology during hospitalization.  Debility, deconditioning.  Will get PT OT evaluation.  Patient might need a skilled nursing facility placement.   DVT prophylaxis: enoxaparin (LOVENOX) injection 40 mg Start: 04/27/20 2000  Code Status: Partial code, no cpr  Family Communication:  I again spoke with the  patient's husband at bedside. I also spoke with Ms Levada Dy and updated hear about clinical condition of the patient. Advised to work with palliative care for long term  goals.   Status is: Inpatient  The patient will require care spanning > 2 midnights and should be moved to inpatient because: Unsafe d/c plan, IV treatments appropriate due to intensity of illness or inability to take  PO, Inpatient level of care appropriate due to severity of illness and Ongoing hallucinations, agitation and delirium, further work-up, neurology consultation.  Dispo: The patient is from: Home              Anticipated d/c is to: Likely to skilled nursing facility at this time.              Anticipated d/c date is: 2 to 3 days              Patient currently is not medically stable to d/c.  Consultants: Oncology  Neurology Palliative care  Procedures:  None  Antibiotics:  . Rocephin IV 10/20>10/22 . Ampicillin 04/29/2020> .  Subjective: Today, patient was seen and examined at bedside.  Alert awake and communicative but gets confused and disoriented easily.  Sitter at bedside.  Objective: Vitals:   04/29/20 1239 04/29/20 2051  BP: 104/65 138/64  Pulse: 75 96  Resp: 15 18  Temp: 97.7 F (36.5 C)   SpO2: 95% 94%    Intake/Output Summary (Last 24 hours) at 04/30/2020 0740 Last data filed at 04/29/2020 1708 Gross per 24 hour  Intake 100 ml  Output 150 ml  Net -50 ml   Filed Weights   04/29/20 0900  Weight: 70.1 kg   Body mass index is 28.28 kg/m.   Physical Exam:  General: Thinly built, alert awake and communicative but disoriented and confused. HENT:   Mild pallor noted, no icterus noted. Oral mucosa is moist.  Chest:  Clear breath sounds.  Diminished breath sounds bilaterally. No crackles or wheezes.  CVS: S1 &S2 heard. No murmur.  Regular rate and rhythm. Abdomen: Soft, nontender, nondistended.  Bowel sounds are heard.   Extremities: No cyanosis, clubbing but trace lower extremity edema.  Peripheral pulses are palpable. Psych: Alert, awake and oriented, normal mood CNS:  No cranial nerve deficits.  Power equal in all extremities.   Skin: Warm and dry.  Scalp nodules and neck lesion   Data Review: I have personally reviewed the following laboratory data and studies,  CBC: Recent Labs  Lab 04/26/20 1115 04/26/20 1115 04/27/20 0923 04/27/20 1315  04/28/20 0450 04/29/20 0415 04/30/20 0440  WBC 11.1*   < > 12.7* 10.3 8.9 8.5 7.3  NEUTROABS 8.4*  --  10.0*  --   --   --   --   HGB 9.5*   < > 9.0* 8.6* 8.2* 7.9* 7.7*  HCT 31.6*   < > 29.5* 27.6* 27.3* 26.3* 25.7*  MCV 83.6   < > 83.1 81.9 82.7 84.8 85.1  PLT 231   < > 249 220 216 203 203   < > = values in this interval not displayed.   Basic Metabolic Panel: Recent Labs  Lab 04/26/20 1115 04/27/20 1315 04/28/20 0450 04/29/20 0415 04/30/20 0440  NA 126* 128* 132* 135 137  K 4.6 4.8 4.6 4.4 4.1  CL 98 101 103 108 111  CO2 19* 17* 19* 16* 16*  GLUCOSE 98 90 81 74 115*  BUN 18 19 17 15 15   CREATININE 0.90 0.78 0.71 0.76 0.78  CALCIUM 7.2* 7.4* 7.5* 7.6* 7.6*  MG  --   --   --  2.1 2.1  PHOS  --   --   --  2.8  --    Liver Function Tests: Recent Labs  Lab 04/26/20 1115 04/27/20 1315  AST 40 38  ALT 14 14  ALKPHOS 91 88  BILITOT 0.6 0.8  PROT 6.6 6.5  ALBUMIN 2.0* 2.1*   No results for input(s): LIPASE, AMYLASE in the last 168 hours. Recent Labs  Lab 04/27/20 1329  AMMONIA 24   Cardiac Enzymes: No results for input(s): CKTOTAL, CKMB, CKMBINDEX, TROPONINI in the last 168 hours. BNP (last 3 results) Recent Labs    04/27/20 1844  BNP 76.6    ProBNP (last 3 results) No results for input(s): PROBNP in the last 8760 hours.  CBG: No results for input(s): GLUCAP in the last 168 hours. Recent Results (from the past 240 hour(s))  Respiratory Panel by RT PCR (Flu A&B, Covid) - Nasopharyngeal Swab     Status: None   Collection Time: 04/21/20  7:40 AM   Specimen: Nasopharyngeal Swab  Result Value Ref Range Status   SARS Coronavirus 2 by RT PCR NEGATIVE NEGATIVE Final    Comment: (NOTE) SARS-CoV-2 target nucleic acids are NOT DETECTED.  The SARS-CoV-2 RNA is generally detectable in upper respiratoy specimens during the acute phase of infection. The lowest concentration of SARS-CoV-2 viral copies this assay can detect is 131 copies/mL. A negative result does  not preclude SARS-Cov-2 infection and should not be used as the sole basis for treatment or other patient management decisions. A negative result may occur with  improper specimen collection/handling, submission of specimen other than nasopharyngeal swab, presence of viral mutation(s) within the areas targeted by this assay, and inadequate number of viral copies (<131 copies/mL). A negative result must be combined with clinical observations, patient history, and epidemiological information. The expected result is Negative.  Fact Sheet for Patients:  PinkCheek.be  Fact Sheet for Healthcare Providers:  GravelBags.it  This test is no t yet approved or cleared by the [place] FDA and  has been authorized for detection and/or diagnosis of SARS-CoV-2 by FDA under an Emergency Use Authorization (EUA). This EUA will remain  in effect (meaning this test can be used) for the duration of the COVID-19 declaration under Section 564(b)(1) of the Act, 21 U.S.C. section 360bbb-3(b)(1), unless the authorization is terminated or revoked sooner.     Influenza A by PCR NEGATIVE NEGATIVE Final   Influenza B by PCR NEGATIVE NEGATIVE Final    Comment: (NOTE) The Xpert Xpress SARS-CoV-2/FLU/RSV assay is intended as an aid in  the diagnosis of influenza from Nasopharyngeal swab specimens and  should not be used as a sole basis for treatment. Nasal washings and  aspirates are unacceptable for Xpert Xpress SARS-CoV-2/FLU/RSV  testing.  Fact Sheet for Patients: PinkCheek.be  Fact Sheet for Healthcare Providers: GravelBags.it  This test is not yet approved or cleared by the [place] FDA and  has been authorized for detection and/or diagnosis of SARS-CoV-2 by  FDA under an Emergency Use Authorization (EUA). This EUA will remain  in effect (meaning this test can be used) for the  duration of the  Covid-19 declaration under Section 564(b)(1) of the Act, 21  U.S.C. section 360bbb-3(b)(1), unless the authorization is  terminated or revoked. Performed at [facility], [address], [place], [place] [address]   Urine Culture     Status: Abnormal   Collection Time: 04/27/20  9:23 AM   Specimen: Urine, Random  Result Value Ref Range Status   Specimen Description   Final    URINE, RANDOM Performed  at [facility], [address], [place], [place] [address]    Special Requests   Final    NONE Performed at [facility], [address] [place], [place] [address]    Culture 20,000 COLONIES/mL ENTEROCOCCUS FAECALIS (A)  Final   Report Status 04/29/2020 FINAL  Final   Organism ID, Bacteria ENTEROCOCCUS FAECALIS (A)  Final      Susceptibility   Enterococcus faecalis - MIC*    AMPICILLIN <=2 SENSITIVE Sensitive     NITROFURANTOIN <=16 SENSITIVE Sensitive     VANCOMYCIN 1 SENSITIVE Sensitive     * 20,000 COLONIES/mL ENTEROCOCCUS FAECALIS  Blood culture (routine x 2)     Status: None (Preliminary result)   Collection Time: 04/27/20  6:44 PM   Specimen: BLOOD  Result Value Ref Range Status   Specimen Description BLOOD PORTA CATH  Final   Special Requests   Final    BOTTLES DRAWN AEROBIC AND ANAEROBIC Blood Culture results may not be optimal due to an inadequate volume of blood received in culture bottles   Culture   Final    NO GROWTH 3 DAYS Performed at [facility], [address], [place], [place] [address]    Report Status PENDING  Incomplete  Blood culture (routine x 2)     Status: None (Preliminary result)   Collection Time: 04/27/20  6:44 PM   Specimen: BLOOD  Result Value Ref Range Status   Specimen Description BLOOD RIGHT ANTECUBITAL  Final   Special Requests   Final    BOTTLES DRAWN AEROBIC AND ANAEROBIC Blood Culture results may not be optimal due to an inadequate volume of blood received in culture bottles    Culture   Final    NO GROWTH 3 DAYS Performed at [facility], [address], [place], [place] [address]    Report Status PENDING  Incomplete  Respiratory Panel by RT PCR (Flu A&B, Covid) - Nasopharyngeal Swab     Status: None   Collection Time: 04/27/20  6:57 PM   Specimen: Nasopharyngeal Swab  Result Value Ref Range Status   SARS Coronavirus 2 by RT PCR NEGATIVE NEGATIVE Final    Comment: (NOTE) SARS-CoV-2 target nucleic acids are NOT DETECTED.  The SARS-CoV-2 RNA is generally detectable in upper respiratoy specimens during the acute phase of infection. The lowest concentration of SARS-CoV-2 viral copies this assay can detect is 131 copies/mL. A negative result does not preclude SARS-Cov-2 infection and should not be used as the sole basis for treatment or other patient management decisions. A negative result may occur with  improper specimen collection/handling, submission of specimen other than nasopharyngeal swab, presence of viral mutation(s) within the areas targeted by this assay, and inadequate number of viral copies (<131 copies/mL). A negative result must be combined with clinical observations, patient history, and epidemiological information. The expected result is Negative.  Fact Sheet for Patients:  PinkCheek.be  Fact Sheet for Healthcare Providers:  GravelBags.it  This test is no t yet approved or cleared by the [place] FDA and  has been authorized for detection and/or diagnosis of SARS-CoV-2 by FDA under an Emergency Use Authorization (EUA). This EUA will remain  in effect (meaning this test can be used) for the duration of the COVID-19 declaration under Section 564(b)(1) of the Act, 21 U.S.C. section 360bbb-3(b)(1), unless the authorization is terminated or revoked sooner.     Influenza A by PCR NEGATIVE NEGATIVE Final   Influenza B by PCR NEGATIVE NEGATIVE Final  Comment:  (NOTE) The Xpert Xpress SARS-CoV-2/FLU/RSV assay is intended as an aid in  the diagnosis of influenza from Nasopharyngeal swab specimens and  should not be used as a sole basis for treatment. Nasal washings and  aspirates are unacceptable for Xpert Xpress SARS-CoV-2/FLU/RSV  testing.  Fact Sheet for Patients: PinkCheek.be  Fact Sheet for Healthcare Providers: GravelBags.it  This test is not yet approved or cleared by the [place] FDA and  has been authorized for detection and/or diagnosis of SARS-CoV-2 by  FDA under an Emergency Use Authorization (EUA). This EUA will remain  in effect (meaning this test can be used) for the duration of the  Covid-19 declaration under Section 564(b)(1) of the Act, 21  U.S.C. section 360bbb-3(b)(1), unless the authorization is  terminated or revoked. Performed at [facility], [address], [place], [place] [address]      Studies: MR BRAIN W WO CONTRAST  Result Date: 04/29/2020 CLINICAL DATA:  Metastatic disease evaluation. EXAM: MRI HEAD WITHOUT AND WITH CONTRAST TECHNIQUE: Multiplanar, multiecho pulse sequences of the brain and surrounding structures were obtained without and with intravenous contrast. CONTRAST:  47mL GADAVIST GADOBUTROL 1 MMOL/ML IV SOLN COMPARISON:  04/21/2020 MRIs of the head and prior. 04/27/2020 head CT. FINDINGS: Brain: Mild cerebral atrophy with ex vacuo dilatation. Advanced chronic microvascular ischemic changes. Chronic right thalamic insult. Scattered foci of DWI hyperintensity involving the right frontal, left parietal and bilateral occipital regions, some of which demonstrate ADC correlate are new. Prior foci of restricted diffusion are not demonstrated on the current exam. Tiny focus of SWI signal dropout involving the right occipital lobe is unchanged. No midline shift, ventriculomegaly or extra-axial fluid collection. No abnormal enhancement. Please  note that image quality is degraded by motion artifact. Vascular: Proximally preserved major intracranial flow voids. Skull and upper cervical spine: No focal osseous lesion. Sinuses/Orbits: Sequela of bilateral lens replacement. Pneumatized paranasal sinuses and mastoid air cells. Other: 0.9 cm anterior midline enhancing scalp nodule is unchanged (12:26). Redemonstration of 3.0 cm enhancing left parietal scalp mass (12:25). Enhancing 3 mm left temporal focus (12:17) not demonstrated on prior exam. IMPRESSION: Scattered foci of DWI hyperintensity involving the right frontal, left parietal and bilateral occipital regions are suspicious for acute/subacute infarcts. No correlated of enhancement or SWI signal dropout. 3 mm enhancing left temporal lesion is new. Differential includes small metastasis versus vascular in origin. Prior foci of restricted diffusion are not demonstrated on the current exam. Advanced chronic microvascular ischemic changes. Grossly unchanged appearance of enhancing anterior midline and left parietal scalp lesions. These results will be called to the ordering clinician or representative by the Radiologist Assistant, and communication documented in the PACS or [employer]. Electronically Signed   By: Primitivo Gauze M.D.   On: 04/29/2020 11:26      Flora Lipps, MD  [facility] 04/30/2020

## 2020-05-01 DIAGNOSIS — R443 Hallucinations, unspecified: Secondary | ICD-10-CM | POA: Diagnosis not present

## 2020-05-01 DIAGNOSIS — C649 Malignant neoplasm of unspecified kidney, except renal pelvis: Secondary | ICD-10-CM | POA: Diagnosis not present

## 2020-05-01 DIAGNOSIS — C7951 Secondary malignant neoplasm of bone: Secondary | ICD-10-CM | POA: Diagnosis not present

## 2020-05-01 DIAGNOSIS — I10 Essential (primary) hypertension: Secondary | ICD-10-CM | POA: Diagnosis not present

## 2020-05-01 NOTE — Progress Notes (Signed)
PROGRESS NOTE  Mariah Thornton QMG:[id number] DOB: 13-Nov-1945 DOA: 04/27/2020 PCP: Philmore Pali, NP   LOS: 3 days   Brief narrative: As per HPI,  Mariah Thornton is a 74 y.o. female with medical history significant for stage IV renal cell carcinoma (followed by Dr. Mike Gip) s/p1 cycle of Keytruda on 04/07/2020 and onoral Inlyta as well as history of HTN, CVA, chronic anemia, recently hospitalized from 10/13-10/16 with excessive somnolence, multifactorial etiology including small infarcts seen on MRI brain, pain medications and dehydration from poor oral intake who was sent by the oncology clinic for admission due to complaints of hallucinations consisting of seeing animals around her.  Patient denied headache, visual disturbance or one-sided weakness numbness or tingling. On arrival to the ED, vitals were within normal limits with somewhat soft blood pressure of 117/51.  Blood work mostly notable for hemoglobin of 8.6, which is about her baseline, normal WBC of 10.3 but with elevated lactic acid of 2.2, sodium 128.  Urinalysis showed moderate leukocyte esterase. EKG showed  normal sinus rhythm with no acute ST-T wave changes. Patient was started on IV fluids, Rocephin.  Hospitalist consulted for admission for possible sepsis secondary to UTI with hallucinations and agitation.  Assessment/Plan:  Principal Problem:   Hallucinations Active Problems:   Bone metastases (HCC)   Hypertension   UTI (urinary tract infection)   Metastatic renal cell carcinoma (HCC)   Hypotension   Metabolic encephalopathy  Hallucinations, metabolic encephalopathy with delirium Patient was also seen by neurology and oncology during hospitalization.  She did have a MRI of the head which showed multiple areas of infarct as before plus new 3 mm lesion in the temporal lobe, which could be a vascular finding versus metastasis..  CT head scan showed no acute intracranial findings. Random cortisol was 20.0.  Sodium level 135.[place] Kitchen   Blood cultures were negative during hospitalization.Mar[place] Kitchen  COVID-19 and flu was negative. Urine drug screen was negative. Ammonia was 24. Liver function test within normal limits. Urinalysis was mildly abnormal, urine culture with insignificant colonies of Enterococcus.  Patient initially received the antibiotic for the same.  CRP was mildly elevated.  Vitamin B12 -5000.  Sed rate mild elevated at 60.   palliative care was consulted for goals of care.  Patient's family have subsequently decided to proceed with comfort care.  I had a prolonged discussion with family members with bedside conference yesterday.  Patient appears to be comfortable today.   Volume depletion. improved IV fluid hydration.  Continue oral intake at this time.  Enterococcus urine tract infection   No leukocytosis or fever but lactate was elevated. Responded to IV fluids.  Urine culture with insignificant  colonies of Enterococcus.  Patient initially was on Rocephin which was changed to penicillin.  At this time antibiotic has been discontinued for comfort care.    Recent embolic CVA Was on aspirin and Plavix.   CT head scan was negative for bleeding.  Seen by neurology as well.  At this time patient has been transitioned to comfort care.  Folic acid deficiency.  Metastatic renal cell carcinoma (HCC) to bone and lung Patient presented with confusion, disorientation agitation and hallucination    Patient has received [facility] as outpatient.  Seen by oncology during hospitalization.  At the request of the family patient has been transitioned to comfort care.  Debility, deconditioning, persistent encephalopathy.   DVT prophylaxis: None for comfort  Code Status: DNR  Family Communication: Spoke with Ms. Levada Dy and the patient's husband at  bedside.  Status is: Inpatient  The patient will require care spanning > 2 midnights and should be moved to inpatient because: Transition to comfort care.  Possible need for hospice  Dispo:  The patient is from: Home              Anticipated d/c is to: In-hospital death/hospice.  Transition of care has been consulted.              Anticipated d/c date is: Undetermined at this time.              Patient currently is not medically stable to d/c.  Consultants: Oncology  Neurology Palliative care  Procedures:  None  Antibiotics:  . Rocephin IV 10/20>10/22 . Ampicillin 04/29/2020>10/23 .  Subjective: Today, patient was seen at bedside.  Appears to be comfortable.  Objective: Vitals:   04/30/20 1353 04/30/20 2329  BP:  (!) 150/59  Pulse:  79  Resp:  (!) 23  Temp:  (!) 97.5 F (36.4 C)  SpO2: 96% 99%    Intake/Output Summary (Last 24 hours) at 05/01/2020 0810 Last data filed at 04/30/2020 1320 Gross per 24 hour  Intake 120 ml  Output --  Net 120 ml   Filed Weights   04/29/20 0900  Weight: 70.1 kg   Body mass index is 28.28 kg/m.   Physical Exam:  General: Thinly built, alert awake and communicative but disoriented and confused. HENT:   Mild pallor noted, no icterus noted. Oral mucosa is moist.  Appears to be comfortable.  Data Review: I have personally reviewed the following laboratory data and studies,  CBC: Recent Labs  Lab 04/26/20 1115 04/26/20 1115 04/27/20 0923 04/27/20 1315 04/28/20 0450 04/29/20 0415 04/30/20 0440  WBC 11.1*   < > 12.7* 10.3 8.9 8.5 7.3  NEUTROABS 8.4*  --  10.0*  --   --   --   --   HGB 9.5*   < > 9.0* 8.6* 8.2* 7.9* 7.7*  HCT 31.6*   < > 29.5* 27.6* 27.3* 26.3* 25.7*  MCV 83.6   < > 83.1 81.9 82.7 84.8 85.1  PLT 231   < > 249 220 216 203 203   < > = values in this interval not displayed.   Basic Metabolic Panel: Recent Labs  Lab 04/26/20 1115 04/27/20 1315 04/28/20 0450 04/29/20 0415 04/30/20 0440  NA 126* 128* 132* 135 137  K 4.6 4.8 4.6 4.4 4.1  CL 98 101 103 108 111  CO2 19* 17* 19* 16* 16*  GLUCOSE 98 90 81 74 115*  BUN 18 19 17 15 15   CREATININE 0.90 0.78 0.71 0.76 0.78  CALCIUM 7.2* 7.4*  7.5* 7.6* 7.6*  MG  --   --   --  2.1 2.1  PHOS  --   --   --  2.8  --    Liver Function Tests: Recent Labs  Lab 04/26/20 1115 04/27/20 1315  AST 40 38  ALT 14 14  ALKPHOS 91 88  BILITOT 0.6 0.8  PROT 6.6 6.5  ALBUMIN 2.0* 2.1*   No results for input(s): LIPASE, AMYLASE in the last 168 hours. Recent Labs  Lab 04/27/20 1329  AMMONIA 24   Cardiac Enzymes: No results for input(s): CKTOTAL, CKMB, CKMBINDEX, TROPONINI in the last 168 hours. BNP (last 3 results) Recent Labs    04/27/20 1844  BNP 76.6    ProBNP (last 3 results) No results for input(s): PROBNP in the last 8760 hours.  CBG: No  results for input(s): GLUCAP in the last 168 hours. Recent Results (from the past 240 hour(s))  Urine Culture     Status: Abnormal   Collection Time: 04/27/20  9:23 AM   Specimen: Urine, Random  Result Value Ref Range Status   Specimen Description   Final    URINE, RANDOM Performed at [facility], [address], [place], [place] [address]    Special Requests   Final    NONE Performed at [facility], [address] [place], [place] [address]    Culture 20,000 COLONIES/mL ENTEROCOCCUS FAECALIS (A)  Final   Report Status 04/29/2020 FINAL  Final   Organism ID, Bacteria ENTEROCOCCUS FAECALIS (A)  Final      Susceptibility   Enterococcus faecalis - MIC*    AMPICILLIN <=2 SENSITIVE Sensitive     NITROFURANTOIN <=16 SENSITIVE Sensitive     VANCOMYCIN 1 SENSITIVE Sensitive     * 20,000 COLONIES/mL ENTEROCOCCUS FAECALIS  Blood culture (routine x 2)     Status: None (Preliminary result)   Collection Time: 04/27/20  6:44 PM   Specimen: BLOOD  Result Value Ref Range Status   Specimen Description BLOOD PORTA CATH  Final   Special Requests   Final    BOTTLES DRAWN AEROBIC AND ANAEROBIC Blood Culture results may not be optimal due to an inadequate volume of blood received in culture bottles   Culture   Final    NO GROWTH 4 DAYS Performed at [facility], [address], [place], [place] [address]    Report Status PENDING  Incomplete  Blood culture (routine x 2)     Status: None (Preliminary result)   Collection Time: 04/27/20  6:44 PM   Specimen: BLOOD  Result Value Ref Range Status   Specimen Description BLOOD RIGHT ANTECUBITAL  Final   Special Requests   Final    BOTTLES DRAWN AEROBIC AND ANAEROBIC Blood Culture results may not be optimal due to an inadequate volume of blood received in culture bottles   Culture   Final    NO GROWTH 4 DAYS Performed at [facility], [address] [place],  [address]    Report Status PENDING  Incomplete  Respiratory Panel by RT PCR (Flu A&B, Covid) - Nasopharyngeal Swab     Status: None   Collection Time: 04/27/20  6:57 PM   Specimen: Nasopharyngeal Swab  Result Value Ref Range Status   SARS Coronavirus 2 by RT PCR NEGATIVE NEGATIVE Final    Comment: (NOTE) SARS-CoV-2 target nucleic acids are NOT DETECTED.  The SARS-CoV-2 RNA is generally detectable in upper respiratoy specimens during the acute phase of infection. The lowest concentration of SARS-CoV-2 viral copies this assay can detect is 131 copies/mL. A negative result does not preclude SARS-Cov-2 infection and should not be used as the sole basis for treatment or other patient management decisions. A negative result may occur with  improper specimen collection/handling, submission of specimen other than nasopharyngeal swab, presence of viral mutation(s) within the areas targeted by this assay, and inadequate number of viral copies (<131 copies/mL). A negative result must be combined with clinical observations, patient history, and epidemiological information. The expected result is Negative.  Fact Sheet for Patients:  PinkCheek.be  Fact Sheet for Healthcare Providers:  GravelBags.it  This test is no t yet approved or cleared by the [place] FDA and   has been authorized for detection and/or diagnosis of SARS-CoV-2 by FDA under an Emergency Use Authorization (EUA).  This EUA will remain  in effect (meaning this test can be used) for the duration of the COVID-19 declaration under Section 564(b)(1) of the Act, 21 U.S.C. section 360bbb-3(b)(1), unless the authorization is terminated or revoked sooner.     Influenza A by PCR NEGATIVE NEGATIVE Final   Influenza B by PCR NEGATIVE NEGATIVE Final    Comment: (NOTE) The Xpert Xpress SARS-CoV-2/FLU/RSV assay is intended as an aid in  the diagnosis of influenza from Nasopharyngeal swab specimens and  should not be used as a sole basis for treatment. Nasal washings and  aspirates are unacceptable for Xpert Xpress SARS-CoV-2/FLU/RSV  testing.  Fact Sheet for Patients: PinkCheek.be  Fact Sheet for Healthcare Providers: GravelBags.it  This test is not yet approved or cleared by the [place] FDA and  has been authorized for detection and/or diagnosis of SARS-CoV-2 by  FDA under an Emergency Use Authorization (EUA). This EUA will remain  in effect (meaning this test can be used) for the duration of the  Covid-19 declaration under Section 564(b)(1) of the Act, 21  U.S.C. section 360bbb-3(b)(1), unless the authorization is  terminated or revoked. Performed at [facility], [address] [place], [place] [address]      Studies: MR BRAIN W WO CONTRAST  Result Date: 04/29/2020 CLINICAL DATA:  Metastatic disease evaluation. EXAM: MRI HEAD WITHOUT AND WITH CONTRAST TECHNIQUE: Multiplanar, multiecho pulse sequences of the brain and surrounding structures were obtained without and with intravenous contrast. CONTRAST:  50mL GADAVIST GADOBUTROL 1 MMOL/ML IV SOLN COMPARISON:  04/21/2020 MRIs of the head and prior. 04/27/2020 head CT. FINDINGS: Brain: Mild cerebral atrophy with ex vacuo dilatation. Advanced chronic microvascular  ischemic changes. Chronic right thalamic insult. Scattered foci of DWI hyperintensity involving the right frontal, left parietal and bilateral occipital regions, some of which demonstrate ADC correlate are new. Prior foci of restricted diffusion are not demonstrated on the current exam. Tiny focus of SWI signal dropout involving the right occipital lobe is unchanged. No midline shift, ventriculomegaly or extra-axial fluid collection. No abnormal enhancement. Please note that image quality is degraded by motion artifact. Vascular: Proximally preserved major intracranial flow voids. Skull and upper cervical spine: No focal osseous lesion. Sinuses/Orbits: Sequela of bilateral lens replacement. Pneumatized paranasal sinuses and mastoid air cells. Other: 0.9 cm anterior midline enhancing scalp nodule is unchanged (12:26). Redemonstration of 3.0 cm enhancing left parietal scalp mass (12:25). Enhancing 3 mm left temporal focus (12:17) not demonstrated on prior exam. IMPRESSION: Scattered foci of DWI hyperintensity involving the right frontal, left parietal and bilateral occipital regions are suspicious for acute/subacute infarcts. No correlated of enhancement or SWI signal dropout. 3 mm enhancing left temporal lesion is new. Differential includes small metastasis versus vascular in origin. Prior foci of restricted diffusion are not demonstrated on the current exam. Advanced chronic microvascular ischemic changes. Grossly unchanged appearance of enhancing anterior midline and left parietal scalp lesions. These results will be called to the ordering clinician or representative by the Radiologist Assistant, and communication documented in the PACS or [employer]. Electronically Signed   By: Primitivo Gauze M.D.   On: 04/29/2020 11:26      Flora Lipps, MD  [facility] 05/01/2020

## 2020-05-01 NOTE — Progress Notes (Signed)
Pt has been alert and mostly oriented this day with several family members at bedside.  Pt has eaten an entire hotdog and a strawberry milkshake.  She has been conversationally appropriate with family and even cracking jokes. Pt appears and states that she is comfortable.

## 2020-05-02 ENCOUNTER — Telehealth: Payer: Self-pay | Admitting: Hematology and Oncology

## 2020-05-02 DIAGNOSIS — R443 Hallucinations, unspecified: Secondary | ICD-10-CM | POA: Diagnosis not present

## 2020-05-02 DIAGNOSIS — C649 Malignant neoplasm of unspecified kidney, except renal pelvis: Secondary | ICD-10-CM | POA: Diagnosis not present

## 2020-05-02 DIAGNOSIS — R4182 Altered mental status, unspecified: Secondary | ICD-10-CM

## 2020-05-02 DIAGNOSIS — C7951 Secondary malignant neoplasm of bone: Secondary | ICD-10-CM | POA: Diagnosis not present

## 2020-05-02 DIAGNOSIS — Z515 Encounter for palliative care: Secondary | ICD-10-CM | POA: Diagnosis not present

## 2020-05-02 DIAGNOSIS — I10 Essential (primary) hypertension: Secondary | ICD-10-CM | POA: Diagnosis not present

## 2020-05-02 LAB — CULTURE, BLOOD (ROUTINE X 2)
Culture: NO GROWTH
Culture: NO GROWTH

## 2020-05-02 NOTE — TOC Progression Note (Addendum)
Transition of Care [facility]) - Progression Note    Patient Details  Name: ZYRIA FISCUS MRN: [id number] Date of Birth: [birth year]  Transition of Care [facility]) CM/SW [place], LCSW Phone Number: 05/02/2020, 11:04 AM  Clinical Narrative:   Updated by Santiago Glad with [facility] who spoke with NP Josh Borders, she is reviewing to see if patient would be Hospice Home appropriate.  2:20- Patient on Hospice Home wait list. No beds today per Representative Santiago Glad.     Expected Discharge Plan:  (unknown) Barriers to Discharge: Continued Medical Work up  Expected Discharge Plan and Services Expected Discharge Plan:  (unknown) In-house Referral: Clinical Social Work   Post Acute Care Choice: [place] Living arrangements for the past 2 months: [place] Determinants of Health (SDOH) Interventions    Readmission Risk Interventions Readmission Risk Prevention Plan 04/29/2020  Transportation Screening Complete  Medication Review Press photographer) Complete  PCP or Specialist appointment within 3-5 days of discharge Complete  HRI or [place] Complete  SW Recovery Care/Counseling Consult Complete  Skilled [place] Complete  Some recent data might be hidden

## 2020-05-02 NOTE — Progress Notes (Signed)
[facility] Liaison note:  Follow up visit made to new referral for [facility] hospice home. Eligibility has been confirmed.  Writer met again with patient's friend Juliann Pulse in the room to initiate education regarding hospice services, philosophy, team approach to care and current visitation policy with understanding voiced.   [facility] is unable to offer a bed today. Writer to follow up again in the morning with family and TOC regarding bed availability. Thank you for the opportunity to be involved in the care of this patient and her family.  Flo Shanks BSN, RN, [place] [phone]

## 2020-05-02 NOTE — Progress Notes (Signed)
[place]  Telephone:([phone] [phone]   Name: Mariah Thornton Date: 05/02/2020 MRN: [id number]  DOB: [birth year]  Patient Care Team: Philmore Pali, NP as PCP - General (Nurse Practitioner)    REASON FOR CONSULTATION: Mariah Thornton is a 74 y.o. 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  Patient also had an MRI of the lumbar spine revealing widespread metastatic disease and epidural tumor at L1.  She was seen in [facility] on 04/27/2020 again for altered mental status and was found to have hyponatremia with possible UTI.  Patient was subsequently sent to the ER and hospitalized for same.  Palliative care was consulted to help address goals.  CODE STATUS: DNR  PAST MEDICAL HISTORY:History reviewed. No pertinent past medical history.  PAST SURGICAL HISTORY:  Past Surgical History:  Procedure Laterality Date  . PORTA CATH INSERTION N/A 03/28/2020   Procedure: PORTA CATH INSERTION;  Surgeon: Algernon Huxley, MD;  Location: [place] [facility];  Service: Cardiovascular;  Laterality: N/A;  . TEE WITHOUT CARDIOVERSION N/A 04/22/2020   Procedure: TRANSESOPHAGEAL ECHOCARDIOGRAM (TEE);  Surgeon: Minna Merritts, MD;  Location: [facility] ORS;  Service: Cardiovascular;  Laterality: N/A;    HEMATOLOGY/ONCOLOGY HISTORY:  Oncology History  Renal cell carcinoma of right kidney ([place])  03/16/2020 Initial Diagnosis   Renal cell carcinoma of right kidney ([place])   04/07/2020 -  Chemotherapy   The patient had pembrolizumab (KEYTRUDA) 200 mg in sodium chloride 0.9 % 50 mL chemo infusion, 200 mg, Intravenous, Once, 1 of 6 cycles Administration: 200 mg (04/07/2020)  for  chemotherapy treatment.      ALLERGIES:  has No Known Allergies.  MEDICATIONS:  Current Facility-Administered Medications  Medication Dose Route Frequency Provider Last Rate Last Admin  . acetaminophen (TYLENOL) tablet 650 mg  650 mg Oral Q6H PRN Athena Masse, MD   650 mg at 05/01/20 1417   Or  . acetaminophen (TYLENOL) suppository 650 mg  650 mg Rectal Q6H PRN Athena Masse, MD      . albuterol (PROVENTIL) (2.5 MG/3ML) 0.083% nebulizer solution 2.5 mg  2.5 mg Nebulization Q6H PRN Pokhrel, Laxman, MD   2.5 mg at 04/30/20 1343  . Chlorhexidine Gluconate Cloth 2 % PADS 6 each  6 each Topical Daily Pokhrel, Laxman, MD   6 each at 04/30/20 1020  . haloperidol (HALDOL) tablet 5 mg  5 mg Oral Q6H PRN Pokhrel, Laxman, MD      . LORazepam (ATIVAN) injection 1 mg  1 mg Intravenous Q4H PRN Pokhrel, Laxman, MD   1 mg at 04/30/20 1627  . morphine 2 MG/ML injection 2 mg  2 mg Intravenous Q2H PRN Pokhrel, Laxman, MD   2 mg at 04/30/20 1740  . ondansetron (ZOFRAN) tablet 4 mg  4 mg Oral Q6H PRN Athena Masse, MD       Or  . ondansetron [facility]) injection 4 mg  4 mg Intravenous Q6H PRN Athena Masse, MD        VITAL SIGNS: BP (!) 110/54 (BP Location: Right Arm)   Pulse 85   Temp 98 F (36.7 C) (Oral)   Resp 18   Ht _0  (1.575 m)  Wt 154 lb 9.6 oz (70.1 kg)   SpO2 97%   BMI 28.28 kg/m  Filed Weights   04/29/20 0900  Weight: 154 lb 9.6 oz (70.1 kg)    Estimated body mass index is 28.28 kg/m as calculated from the following:   Height as of this encounter: _0  (1.575 m).   Weight as of this encounter: 154 lb 9.6 oz (70.1 kg).  LABS: CBC:    Component Value Date/Time   WBC 7.3 04/30/2020 0440   HGB 7.7 (L) 04/30/2020 0440   HCT 25.7 (L) 04/30/2020 0440   PLT 203 04/30/2020 0440   MCV 85.1 04/30/2020 0440   NEUTROABS 10.0 (H) 04/27/2020 0923   LYMPHSABS 1.1 04/27/2020 0923   MONOABS 1.1 (H) 04/27/2020 0923   EOSABS 0.2 04/27/2020 0923   BASOSABS 0.0 04/27/2020 0923    Comprehensive Metabolic Panel:    Component Value Date/Time   NA 137 04/30/2020 0440   K 4.1 04/30/2020 0440   CL 111 04/30/2020 0440   CO2 16 (L) 04/30/2020 0440   BUN 15 04/30/2020 0440   CREATININE 0.78 04/30/2020 0440   GLUCOSE 115 (H) 04/30/2020 0440   CALCIUM 7.6 (L) 04/30/2020 0440   AST 38 04/27/2020 1315   ALT 14 04/27/2020 1315   ALKPHOS 88 04/27/2020 1315   BILITOT 0.8 04/27/2020 1315   PROT 6.5 04/27/2020 1315   ALBUMIN 2.1 (L) 04/27/2020 1315    RADIOGRAPHIC STUDIES: EEG  Result Date: 04/22/2020 Alexis Goodell, MD     04/22/2020  7:10 PM ELECTROENCEPHALOGRAM REPORT Patient: Mariah Thornton       Room #: 129A-AA EEG No. ID: [id number] Age: 74 y.o.        Sex: female Requesting Physician: Amery Report Date:  04/22/2020       Interpreting Physician: Alexis Goodell History: Mariah Thornton is an 74 y.o. female with altered mental status Medications: Norvasc, ASA Conditions of Recording:  This is a 21 channel routine scalp EEG performed with bipolar and monopolar montages arranged in accordance to the international 10/20 system of electrode placement. One channel was dedicated to EKG recording. The patient is in the lethargic state. Description:  The background activity is not continuous.  There are short periods when an alpha background rhythm can be achieved but these alternate with frequent, generalized periods of polymorphic delta activity that is most prominent frontally and has the characteristics of FIRDA.  These periods last from 5-6 seconds.  There are also noted intermittent discharges of triphasic morphology.  Hyperventilation was not performed.  Intermittent photic stimulation was performed but failed to illicit any change in the tracing.  IMPRESSION: This is an abnormal electroencephalogram secondary to the presence of frequent periods of slowing that are more prominent frontally (FIRDA) and triphasic waves.  These findings may be seen with a diffuse cerebral disturbance  that is etiologically nonspecific, but may include a metabolic encephalopathy, among other possibilities.  Alexis Goodell, MD Neurology 04/22/2020, 7:05 PM   DG Chest 2 View  Result Date: 04/27/2020 CLINICAL DATA:  Altered mental status EXAM: CHEST - 2 VIEW COMPARISON:  04/20/2020, 03/03/2020 FINDINGS: Right-sided Port-A-Cath remains in place. Stable heart size. Atherosclerotic calcification of the aortic knob. Scattered nodular densities most prevalent within the left lung base, better characterized on recent CT. Small bilateral pleural effusions with associated streaky bibasilar opacities, likely atelectasis. No pneumothorax. IMPRESSION: Small bilateral pleural effusions with associated streaky bibasilar opacities, likely atelectasis. Electronically Signed   By: Davina Poke D.O.  On: 04/27/2020 16:49   DG Chest 2 View  Result Date: 04/20/2020 CLINICAL DATA:  74 year old female with weakness. Bilateral lower extremity swelling. Metastatic renal cell carcinoma. EXAM: CHEST - 2 VIEW COMPARISON:  Chest CT 03/03/2020 and earlier. FINDINGS: Lower lung volumes. Right chest power port. Stable mediastinal contours. Small bilateral pleural effusions. Increased bilateral pulmonary interstitial opacity, with nodular pulmonary metastasis most visible at the left lung base. Paucity of bowel gas in the upper abdomen. Degenerative osseous changes. No destructive osseous lesion identified radiographically. IMPRESSION: 1. Pulmonary metastatic disease re-demonstrated with new small pleural effusions since August. 2. Paucity of bowel gas in the upper abdomen.  Query ascites. Electronically Signed   By: Genevie Ann M.D.   On: 04/20/2020 22:24   CT Head Wo Contrast  Result Date: 04/27/2020 CLINICAL DATA:  Mental status change, unknown cause. EXAM: CT HEAD WITHOUT CONTRAST TECHNIQUE: Contiguous axial images were obtained from the base of the skull through the vertex without intravenous contrast. COMPARISON:  MRI  04/21/2020, CT head 04/20/2020. FINDINGS: Brain: No evidence of acute large vascular territory infarction, hemorrhage, hydrocephalus, extra-axial collection or mass lesion/mass effect. Patchy white matter hypoattenuation appears similar to prior and most likely represents chronic microvascular ischemic disease. Vascular: Calcific atherosclerosis. Skull: Similar left frontal scalp lesion near the vertex and smaller nodule at the midline anteriorly. No acute fracture. Sinuses/Orbits: Clear sinuses.  Unremarkable orbits. Other: No mastoid effusions. IMPRESSION: 1. No evidence of acute intracranial abnormality. 2. 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 3. Similar left frontal scalp lesion near the vertex and smaller nodule at the midline anteriorly. Electronically Signed   By: Margaretha Sheffield MD   On: 04/27/2020 13:59   CT Head Wo Contrast  Result Date: 04/20/2020 CLINICAL DATA:  Mental status changes, history of renal cell carcinoma EXAM: CT HEAD WITHOUT CONTRAST TECHNIQUE: Contiguous axial images were obtained from the base of the skull through the vertex without intravenous contrast. COMPARISON:  02/26/2020 FINDINGS: Brain: Mild atrophic changes and chronic white matter ischemic changes are seen stable from the prior exam. No findings to suggest acute hemorrhage, acute infarction or space-occupying mass lesion are noted. Vascular: No hyperdense vessel or unexpected calcification. Skull: Normal. Negative for fracture or focal lesion. Sinuses/Orbits: No acute finding. Other: Scalp soft tissue nodule is noted on the left near the vertex stable from the prior exam. IMPRESSION: Chronic atrophic and ischemic changes without acute abnormality. Stable soft tissue scalp lesion near the vertex on the left. Smaller nodule is noted in the midline anteriorly. Electronically Signed   By: Inez Catalina M.D.   On: 04/20/2020 23:03   MR BRAIN WO CONTRAST  Result Date: 04/21/2020 CLINICAL DATA:   Mental status change, unknown cause. Additional history obtained from [place] with medical history of recently diagnosed renal cell carcinoma metastatic to bone and lung on chemotherapy. EXAM: MRI HEAD WITHOUT CONTRAST TECHNIQUE: Multiplanar, multiecho pulse sequences of the brain and surrounding structures were obtained without intravenous contrast. COMPARISON:  Noncontrast head CT 04/20/2020. FINDINGS: Brain: Stable, mild generalized cerebral atrophy. There are subcentimeter foci of restricted diffusion within the posterior right frontal lobe white matter (series 2, image 40), within the right frontoparietal white matter (series 2, image 38), within the left parietal lobe (series 2, image 37). An additional subcentimeter focus of restricted diffusion is questioned within the left posterior frontal lobe white matter (series 2, image 37). Advanced multifocal T2/FLAIR hyperintensity within the cerebral white matter which is nonspecific, but consistent with chronic small vessel  ischemic disease. 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 No chronic intracranial blood products. No extra-axial fluid collection. No midline shift. Vascular: Expected proximal arterial flow voids. Skull and upper cervical spine: No focal marrow lesion. Sinuses/Orbits: Visualized orbits show no acute finding. Mild ethmoid and maxillary sinus mucosal thickening. No significant mastoid effusion. Other: Again demonstrated is a 3.2 cm left parietal scalp mass (series 6, image 17). Also redemonstrated is a subcentimeter nodular lesion within the midline anterior scalp (series 6, image 12). IMPRESSION: Subcentimeter foci of restricted diffusion within the right frontoparietal white matter and left parietal white matter (3 sites total). An additional subcentimeter focus of restricted diffusion is questioned within the posterior left frontal lobe white matter. These foci may reflect acute  infarcts and are suspicious for an embolic process. Given the history of recently diagnosed renal cell carcinoma, tiny metastases cannot be excluded and follow-up contrast-enhanced MR imaging should be considered. Mild generalized cerebral atrophy. Advanced cerebral white matter chronic small vessel ischemic disease. 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 Redemonstrated 3.2 cm left parietal scalp mass as well as subcentimeter nodular lesion within the midline anterior scalp. Correlate with direct visualization and consider tissue sampling if not already performed. Electronically Signed   By: Kellie Simmering DO   On: 04/21/2020 11:39   MR BRAIN W CONTRAST  Result Date: 04/21/2020 CLINICAL DATA:  74 year old female with altered mental status. Metastatic renal cell carcinoma. Multiple small embolic infarcts versus less likely small hyperdense metastases, suspected in the hemispheres on MRI earlier today. EXAM: MRI HEAD WITH CONTRAST TECHNIQUE: Multiplanar, multiecho pulse sequences of the brain and surrounding structures were obtained with intravenous contrast. CONTRAST:  31m GADAVIST GADOBUTROL 1 MMOL/ML IV SOLN COMPARISON:  Noncontrast brain MRI 1120 hours today. FINDINGS: No abnormal intracranial enhancement identified. No enhancement corresponding to the multiple small DWI lesions seen earlier today. No dural thickening. No intracranial mass effect. No ventriculomegaly. Basilar cisterns remain normal. The major dural venous sinuses are enhancing and appear to be patent. Negative visible cervical spine and spinal cord. Heterogeneous bone marrow signal in the calvarium but no enhancing skull lesion identified. However, there is enhancement associated with the lobulated left vertex scalp lesion (series 28, image 14). IMPRESSION: 1. No evidence of cerebral metastatic disease. No enhancement associated with the small DWI lesions demonstrated earlier today, which are therefore most  compatible with multiple small infarcts. 2. The irregular 3.2 cm left vertex scalp soft mass is enhancing. Electronically Signed   By: HGenevie AnnM.D.   On: 04/21/2020 18:46   MR BRAIN W WO CONTRAST  Result Date: 04/29/2020 CLINICAL DATA:  Metastatic disease evaluation. EXAM: MRI HEAD WITHOUT AND WITH CONTRAST TECHNIQUE: Multiplanar, multiecho pulse sequences of the brain and surrounding structures were obtained without and with intravenous contrast. CONTRAST:  723mGADAVIST GADOBUTROL 1 MMOL/ML IV SOLN COMPARISON:  04/21/2020 MRIs of the head and prior. 04/27/2020 head CT. FINDINGS: Brain: Mild cerebral atrophy with ex vacuo dilatation. Advanced chronic microvascular ischemic changes. Chronic right thalamic insult. Scattered foci of DWI hyperintensity involving the right frontal, left parietal and bilateral occipital regions, some of which demonstrate ADC correlate are new. Prior foci of restricted diffusion are not demonstrated on the current exam. Tiny focus of SWI signal dropout involving the right occipital lobe is unchanged. No midline shift, ventriculomegaly or extra-axial fluid collection. No abnormal enhancement. Please note that image quality is degraded by motion artifact. Vascular: Proximally preserved major intracranial flow voids.  Skull and upper cervical spine: No focal osseous lesion. Sinuses/Orbits: Sequela of bilateral lens replacement. Pneumatized paranasal sinuses and mastoid air cells. Other: 0.9 cm anterior midline enhancing scalp nodule is unchanged (12:26). Redemonstration of 3.0 cm enhancing left parietal scalp mass (12:25). Enhancing 3 mm left temporal focus (12:17) not demonstrated on prior exam. IMPRESSION: Scattered foci of DWI hyperintensity involving the right frontal, left parietal and bilateral occipital regions are suspicious for acute/subacute infarcts. No correlated of enhancement or SWI signal dropout. 3 mm enhancing left temporal lesion is new. Differential includes small  metastasis versus vascular in origin. Prior foci of restricted diffusion are not demonstrated on the current exam. Advanced chronic microvascular ischemic changes. Grossly unchanged appearance of enhancing anterior midline and left parietal scalp lesions. These results will be called to the ordering clinician or representative by the Radiologist Assistant, and communication documented in the PACS or [employer]. Electronically Signed   By: Primitivo Gauze M.D.   On: 04/29/2020 11:26   MR Lumbar Spine W Wo Contrast  Result Date: 04/21/2020 CLINICAL DATA:  74 year old female with altered mental status. Metastatic renal cell carcinoma. Staging. EXAM: MRI LUMBAR SPINE WITHOUT AND WITH CONTRAST TECHNIQUE: Multiplanar and multiecho pulse sequences of the lumbar spine were obtained without and with intravenous contrast. CONTRAST:  87m GADAVIST GADOBUTROL 1 MMOL/ML IV SOLN COMPARISON:  CT Abdomen and Pelvis 03/04/2020. FINDINGS: Segmentation:  Normal on the comparison CT. Alignment:  Stable lumbar lordosis since August. Vertebrae: Widespread spinal vertebral metastases. And much of the bony metastatic disease seems to be poorly enhancing (such as the expansile lesion in the right L1 posterior elements seen on series 13, image 7 and postcontrast series 31, image 7). The T12 vertebra appear spared. The L4 and L5 vertebra appear spared. (There is L3 spinous process tumor suspected). Scattered involvement of the visible sacrum and medial iliac bones. Mild pathologic compression fracture of L2 (series 11, image 8 is new since August. Conus medullaris and cauda equina: Conus extends to the T12 level. No lower spinal cord or conus signal abnormality. No abnormal intradural enhancement. There is mild dural thickening and/or early epidural tumor along the right posterior thecal sac at L1. No other lumbar epidural tumor is identified. The posterior element tumor at L1 is associated with some extraosseous extension into  the muscle, as well as regional erector spinae muscle edema (series 11, image 8). Paraspinal and other soft tissues: Abnormal right kidney. Abnormally thickened and nodular visible right hemidiaphragm. Bulky gallstones (series 11, image 1). Large benign left renal cyst. Evidence of generalized intra-abdominal and subcutaneous edema. Diverticulosis of large bowel in the pelvis. Disc levels: Moderate for age lumbar spine degeneration. There is mild degenerative spinal stenosis (such as at L2-L3). There is no malignant neural impingement at this time. IMPRESSION: 1. Widespread osseous metastatic disease in the visible spine and pelvis. A mild pathologic compression fracture of L2 is new since August. 2. Isolated early epidural tumor along the right posterior thecal sac at L1. No other lumbar epidural tumor. Extraosseous extension of tumor into the right erector spinae muscle at L1. No malignant neural impingement at this time. 3. Superimposed lumbar spine degeneration. Electronically Signed   By: HGenevie AnnM.D.   On: 04/21/2020 18:55   NM Bone Scan Whole Body  Result Date: 04/06/2020 CLINICAL DATA:  Lower back and pelvic pain for couple weeks, history of renal cell carcinoma EXAM: NUCLEAR MEDICINE WHOLE BODY BONE SCAN TECHNIQUE: Whole body anterior and posterior images were obtained approximately 3 hours  after intravenous injection of radiopharmaceutical. RADIOPHARMACEUTICALS:  20.917 mCi Technetium-57mMDP IV COMPARISON:  None Correlation: CT abdomen and pelvis 03/04/2020, CT chest 03/03/2020 FINDINGS: Abnormal tracer uptake identified at inferior RIGHT scapula, L1 vertebral body, RIGHT L2 vertebra, and distal LEFT femoral metaphysis suspicious for osseous metastatic disease. Degenerative type uptake at shoulders, sternoclavicular joints, and knees. Soft tissue distribution of tracer unremarkable. Minimal RIGHT renal tracer corresponding to known large RIGHT renal neoplasm decreased RIGHT renal function. Photopenic  defect at mid LEFT kidney corresponding to large cyst on CT. IMPRESSION: Foci of abnormal tracer uptake at the inferior RIGHT scapula, L1 and L2 vertebral bodies, and distal LEFT femoral metaphysis consistent with osseous metastases. Large LEFT renal cyst and known large RIGHT renal neoplasm with poor RIGHT renal function. Electronically Signed   By: MLavonia DanaM.D.   On: 04/06/2020 11:23   UKoreaCarotid Bilateral  Result Date: 04/23/2020 CLINICAL DATA:  74year old female with stroke-like symptoms. EXAM: BILATERAL CAROTID DUPLEX ULTRASOUND TECHNIQUE: GPearline Cablesscale imaging, color Doppler and duplex ultrasound were performed of bilateral carotid and vertebral arteries in the neck. COMPARISON:  None. FINDINGS: Criteria: Quantification of carotid stenosis is based on velocity parameters that correlate the residual internal carotid diameter with NASCET-based stenosis levels, using the diameter of the distal internal carotid lumen as the denominator for stenosis measurement. The following velocity measurements were obtained: RIGHT ICA: Peak systolic velocity 59 cm/sec, End diastolic velocity 9 cm/sec CCA: Peak systolic velocity 74 cm/sec SYSTOLIC ICA/CCA RATIO:  0.8 ECA: Peak systolic velocity 71 cm/sec LEFT ICA: Peak systolic velocity 69 cm/sec, End diastolic velocity 11 cm/sec CCA: 58 cm/sec SYSTOLIC ICA/CCA RATIO:  1.2 ECA: 63 cm/sec RIGHT CAROTID ARTERY: Minimal atherosclerotic plaque formation at the carotid bifurcation. No significant tortuosity. Normal low resistance waveforms. RIGHT VERTEBRAL ARTERY:  Antegrade flow. LEFT CAROTID ARTERY: Minimal atherosclerotic plaque formation at the carotid bifurcation. No significant tortuosity. Normal low resistance waveforms. LEFT VERTEBRAL ARTERY:  Antegrade flow. Upper extremity non-invasive blood pressures: Not obtained. IMPRESSION: 1. Right carotid artery system: Patent without significant atherosclerotic plaque formation. 2. Left carotid artery system: Patent without  significant atherosclerotic plaque formation. 3.  Vertebral artery system: Patent with antegrade flow bilaterally. DRuthann Cancer MD [facility] Electronically Signed   By: DRuthann CancerMD   On: 04/23/2020 11:28   [place]oreaVenous Img Lower Bilateral  Result Date: 04/21/2020 CLINICAL DATA:  Bilateral lower extremity edema, concern for deep venous thrombosis. History of right renal cell carcinoma. No injury. No pain. EXAM: BILATERAL LOWER EXTREMITY VENOUS DOPPLER ULTRASOUND TECHNIQUE: Gray-scale sonography with compression, as well as color and duplex ultrasound, were performed to evaluate the deep venous system(s) from the level of the common femoral vein through the popliteal and proximal calf veins. COMPARISON:  CT abdomen pelvis 03/04/2020. FINDINGS: VENOUS Normal compressibility of the common femoral, superficial femoral, and popliteal veins, as well as the visualized calf veins. Visualized portions of profunda femoral vein and great saphenous vein unremarkable. No filling defects to suggest DVT on grayscale or color Doppler imaging. Doppler waveforms show normal direction of venous flow, normal respiratory plasticity and response to augmentation. Limited views of the contralateral common femoral vein are unremarkable. OTHER None. Limitations: none IMPRESSION: No femoropopliteal DVT nor evidence of DVT within the visualized calf veins. If clinical symptoms are inconsistent or if there are persistent or worsening symptoms, further imaging (possibly involving the iliac veins) may be warranted. Electronically Signed   By: MIven FinnM.D.   On:  04/21/2020 02:14   ECHOCARDIOGRAM COMPLETE  Result Date: 04/06/2020    ECHOCARDIOGRAM REPORT   Patient Name:   Mariah Thornton Date of Exam: 04/06/2020 Medical Rec #:  [id number]     Height:       62.0 in Accession #:    [id number]    Weight:       150.1 lb Date of Birth:  09/12/1945      BSA:          1.692 m  Patient Age:    32 years      BP:           119/68 mmHg Patient Gender: F             HR:           88 bpm. Exam Location:  Outpatient Procedure: 2D Echo, Cardiac Doppler and Color Doppler Indications:    Chemo evaluation  History:        Patient has no prior history of Echocardiogram examinations.                 Renal cell carcinoma.  Sonographer:    Dustin Flock Referring Phys: [id number] [place] Comments: Patient is morbidly obese. Global longitudinal strain was attempted. IMPRESSIONS  1. Left ventricular ejection fraction, by estimation, is 70 to 75%. The left ventricle has hyperdynamic function. The left ventricle has no regional wall motion abnormalities. There is mild left ventricular hypertrophy. Left ventricular diastolic parameters are consistent with Grade I diastolic dysfunction (impaired relaxation).  2. Right ventricular systolic function is normal. The right ventricular size is normal. There is normal pulmonary artery systolic pressure. The estimated right ventricular systolic pressure is 59.9 mmHg.  3. The mitral valve is grossly normal. No evidence of mitral valve regurgitation.  4. The aortic valve is tricuspid. Aortic valve regurgitation is not visualized. Aortic valve mean gradient measures 9.0 mmHg.  5. The inferior vena cava is normal in size with greater than 50% respiratory variability, suggesting right atrial pressure of 3 mmHg. Comparison(s): No prior Echocardiogram. FINDINGS  Left Ventricle: Left ventricular ejection fraction, by estimation, is 70 to 75%. The left ventricle has hyperdynamic function. The left ventricle has no regional wall motion abnormalities. The left ventricular internal cavity size was normal in size. There is mild left ventricular hypertrophy. Left ventricular diastolic parameters are consistent with Grade I diastolic dysfunction (impaired relaxation). Indeterminate filling pressures. Right Ventricle: The right ventricular size is normal. No  increase in right ventricular wall thickness. Right ventricular systolic function is normal. There is normal pulmonary artery systolic pressure. The tricuspid regurgitant velocity is 1.59 m/s, and  with an assumed right atrial pressure of 3 mmHg, the estimated right ventricular systolic pressure is 35.7 mmHg. Left Atrium: Left atrial size was normal in size. Right Atrium: Right atrial size was normal in size. Pericardium: There is no evidence of pericardial effusion. Mitral Valve: The mitral valve is grossly normal. No evidence of mitral valve regurgitation. Tricuspid Valve: The tricuspid valve is grossly normal. Tricuspid valve regurgitation is trivial. Aortic Valve: The aortic valve is tricuspid. Aortic valve regurgitation is not visualized. Aortic valve mean gradient measures 9.0 mmHg. Aortic valve peak gradient measures 18.7 mmHg. Aortic valve area, by VTI measures 2.61 cm. Pulmonic Valve: The pulmonic valve was normal in structure. Pulmonic valve regurgitation is not visualized. Aorta: The aortic root and ascending aorta are structurally normal, with no evidence of dilitation. Venous: The inferior vena cava is normal in  size with greater than 50% respiratory variability, suggesting right atrial pressure of 3 mmHg. IAS/Shunts: No atrial level shunt detected by color flow Doppler.  LEFT VENTRICLE PLAX 2D LVIDd:         3.90 cm  Diastology LVIDs:         2.30 cm  LV e' medial:    6.64 cm/s LV PW:         1.20 cm  LV E/e' medial:  10.9 LV IVS:        1.10 cm  LV e' lateral:   8.70 cm/s LVOT diam:     2.00 cm  LV E/e' lateral: 8.3 LV SV:         98 LV SV Index:   58 LVOT Area:     3.14 cm  RIGHT VENTRICLE             IVC RV Basal diam:  2.00 cm     IVC diam: 1.20 cm RV S prime:     13.70 cm/s TAPSE (M-mode): 1.9 cm LEFT ATRIUM             Index       RIGHT ATRIUM          Index LA diam:        3.40 cm 2.01 cm/m  RA Area:     9.56 cm LA Vol (A2C):   61.7 ml 36.46 ml/m RA Volume:   14.20 ml 8.39 ml/m LA Vol  (A4C):   26.9 ml 15.90 ml/m LA Biplane Vol: 43.9 ml 25.94 ml/m  AORTIC VALVE AV Area (Vmax):    2.08 cm AV Area (Vmean):   2.41 cm AV Area (VTI):     2.61 cm AV Vmax:           216.00 cm/s AV Vmean:          141.000 cm/s AV VTI:            0.375 m AV Peak Grad:      18.7 mmHg AV Mean Grad:      9.0 mmHg LVOT Vmax:         143.00 cm/s LVOT Vmean:        108.000 cm/s LVOT VTI:          0.312 m LVOT/AV VTI ratio: 0.83  AORTA Ao Root diam: 3.20 cm Ao Asc diam:  3.20 cm MITRAL VALVE               TRICUSPID VALVE MV Area (PHT): 2.99 cm    TR Peak grad:   10.1 mmHg MV Decel Time: 254 msec    TR Vmax:        159.00 cm/s MV E velocity: 72.40 cm/s MV A velocity: 88.70 cm/s  SHUNTS MV E/A ratio:  0.82        Systemic VTI:  0.31 m                            Systemic Diam: 2.00 cm Lyman Bishop MD Electronically signed by Lyman Bishop MD Signature Date/Time: 04/06/2020/3:52:03 PM    Final    ECHO TEE  Result Date: 04/22/2020    TRANSESOPHOGEAL ECHO REPORT   Patient Name:   Mariah Thornton Date of Exam: 04/22/2020 Medical Rec #:  [id number]     Height:       62.0 in Accession #:    [id number]    Weight:  145.0 lb Date of Birth:  02-08-1946      BSA:          1.667 m Patient Age:    59 years      BP:           144/129 mmHg Patient Gender: F             HR:           84 bpm. Exam Location:  [facility] Procedure: Transesophageal Echo, Cardiac Doppler and Color Doppler Indications:     Not listed on check-in form  History:         Patient has prior history of Echocardiogram examinations, most                  recent 04/06/2020. No medical history on file.  Sonographer:     Sherrie Sport RDCS (AE) Referring Phys:  [place] Diagnosing Phys: Ida Rogue MD PROCEDURE: After discussion of the risks and benefits of a TEE, an informed consent was obtained from a family member. The transesophogeal probe was passed without difficulty through the esophogus of the patient. Local oropharyngeal anesthetic was provided  with Cetacaine and viscous lidocaine. Sedation performed by performing physician. Patients was under conscious sedation during this procedure. Anesthetic administered: 67mg of Fentanyl, 2.067mof Versed. Image quality was excellent. The patient's vital signs; including heart rate, blood pressure, and oxygen saturation; remained stable throughout the procedure. The patient developed no complications during the procedure. IMPRESSIONS  1. No thrombus noted in the left atrium or left atrial appendage.  2. Left ventricular ejection fraction, by estimation, is 60 to 65%. The left ventricle has normal function. The left ventricle has no regional wall motion abnormalities. There is mild left ventricular hypertrophy.  3. Right ventricular systolic function is normal. The right ventricular size is normal.  4. The mitral valve is normal in structure. Mild mitral valve regurgitation.  5. There is Moderate (Grade III) atheroma plaque involving the transverse and descending aorta.  6. No valve vegetation Conclusion(s)/Recommendation(s): Normal biventricular function without evidence of hemodynamically significant valvular heart disease. FINDINGS  Left Ventricle: Left ventricular ejection fraction, by estimation, is 60 to 65%. The left ventricle has normal function. The left ventricle has no regional wall motion abnormalities. The left ventricular internal cavity size was normal in size. There is  mild left ventricular hypertrophy. Right Ventricle: The right ventricular size is normal. No increase in right ventricular wall thickness. Right ventricular systolic function is normal. Left Atrium: Left atrial size was normal in size. No left atrial/left atrial appendage thrombus was detected. Right Atrium: Right atrial size was normal in size. Pericardium: There is no evidence of pericardial effusion. Mitral Valve: The mitral valve is normal in structure. Mild mitral valve regurgitation. No evidence of mitral valve stenosis. Tricuspid  Valve: The tricuspid valve is normal in structure. Tricuspid valve regurgitation is mild . No evidence of tricuspid stenosis. Aortic Valve: The aortic valve is normal in structure. Aortic valve regurgitation is not visualized. No aortic stenosis is present. Pulmonic Valve: The pulmonic valve was normal in structure. Pulmonic valve regurgitation is not visualized. No evidence of pulmonic stenosis. Aorta: The aortic root is normal in size and structure. There is moderate (Grade III) atheroma plaque involving the transverse and descending aorta. Venous: The inferior vena cava is normal in size with greater than 50% respiratory variability, suggesting right atrial pressure of 3 mmHg. IAS/Shunts: No atrial level shunt detected by color flow Doppler. Agitated  saline contrast was given intravenously to evaluate for intracardiac shunting. Ida Rogue MD Electronically signed by Ida Rogue MD Signature Date/Time: 04/22/2020/1:06:47 PM    Final    DG FEMUR MIN 2 VIEWS LEFT  Result Date: 04/08/2020 CLINICAL DATA:  Left femur met seen on bone scan. Evaluate bone integrity. EXAM: LEFT FEMUR 2 VIEWS COMPARISON:  Bone scan 04/05/2020 FINDINGS: Two view exam of the left femur shows no evidence for worrisome lytic or sclerotic osseous abnormality. Patient has substantial tricompartmental degenerative change in the knee without metaphyseal lesion. Uptake on the bone scan could potentially have been degenerative. IMPRESSION: No suspicious lytic or sclerotic osseous abnormality in the metaphysis of the distal left femur to suggest metastatic disease. Marked tricompartmental degenerative changes. Degenerative disease could account for some of the uptake seen in the distal left femur. Electronically Signed   By: Misty Stanley M.D.   On: 04/08/2020 15:05    PERFORMANCE STATUS (ECOG) : 4 - Bedbound  Review of Systems Unless otherwise noted, a complete review of systems is negative.  Physical Exam General: NAD, frail  appearing Pulmonary: unlabored Extremities: no edema, no joint deformities Skin: no rashes Neurological: Weakness, confused  IMPRESSION: Weekend notes reviewed.  Patient was transitioned to comfort care over the weekend by hospitalist.  Today, she is comfortable appearing.  She remains pleasantly confused.  Family is at bedside.  Spoke with husband and niece.  Patient reportedly only eating bites and sips.  She seems at high risk for decline and recurrent hyponatremia given her current poor oral intake.  Family is interested in pursuing hospice IPU.  I spoke with the hospice liaison who will see patient.  Unclear what bed availability is like.  In the event that patient does not have a bed or is not felt to be appropriate by hospice attending, would recommend home with hospice if family is able to accommodate her care.  PLAN: -Best supportive care -Agree with hospice care -Will consult hospice liaison to help coordinate disposition   Time Total: 25 minutes  Visit consisted of counseling and education dealing with the complex and emotionally intense issues of symptom management and palliative care in the setting of serious and potentially life-threatening illness.Greater than 50%  of this time was spent counseling and coordinating care related to the above assessment and plan.  Signed by: Altha Harm, PhD, NP-C

## 2020-05-02 NOTE — Progress Notes (Signed)
Patient is A&Ox4. Purewick in place for incontinent episodes. She is in no discomfort at the time, per her verbalization.Breathing is even and unlabored. Patient continues on comfort care. Will continue to monitor.

## 2020-05-02 NOTE — Telephone Encounter (Signed)
Juliann Pulse called and said to cancel all Mariah Thornton's appointments. She is with Pallative care now.

## 2020-05-02 NOTE — Progress Notes (Addendum)
[facility] hospital liaison note: New referral for family interest in [place] home received from Palliative NP [place]. TOC Meagan Hagwood made aware. Patient information sent to referral. Hospice home eligibility under review by hospice physician. Writer met in the room with patient, her husband and friend Juliann Pulse to acknowledge receipt of referral and gather further information. Will follow up when eligibility has been confirmed. TOC Meagan updated. Flo Shanks BSN, RN, [place] [phone]

## 2020-05-02 NOTE — Progress Notes (Signed)
PROGRESS NOTE  RAMA SORCI WLS:[id number] DOB: Apr 24, 1946 DOA: 04/27/2020 PCP: Philmore Pali, NP   LOS: 4 days   Brief narrative: As per HPI,  Mariah Thornton is a 74 y.o. female with medical history significant for stage IV renal cell carcinoma (followed by Dr. Mike Gip) s/p1 cycle of Keytruda on 04/07/2020 and onoral Inlyta as well as history of HTN, CVA, chronic anemia, recently hospitalized from 10/13-10/16 with excessive somnolence, multifactorial etiology including small infarcts seen on MRI brain, pain medications and dehydration from poor oral intake who was sent by the oncology clinic for admission due to complaints of hallucinations consisting of seeing animals around her.  Patient denied headache, visual disturbance or one-sided weakness numbness or tingling. On arrival to the ED, vitals were within normal limits with somewhat soft blood pressure of 117/51.  Blood work mostly notable for hemoglobin of 8.6, which is about her baseline, normal WBC of 10.3 but with elevated lactic acid of 2.2, sodium 128.  Urinalysis showed moderate leukocyte esterase. EKG showed  normal sinus rhythm with no acute ST-T wave changes. Patient was started on IV fluids, Rocephin.  Hospitalist consulted for admission for possible sepsis secondary to UTI with hallucinations and agitation.  Assessment/Plan:  Principal Problem:   Hallucinations Active Problems:   Bone metastases (HCC)   Hypertension   UTI (urinary tract infection)   Metastatic renal cell carcinoma (HCC)   Hypotension   Metabolic encephalopathy  Hallucinations, metabolic encephalopathy with delirium Patient was also seen by neurology and oncology during hospitalization.  She did have a MRI of the head which showed multiple areas of infarct as before plus new 3 mm lesion in the temporal lobe, which could be a vascular finding versus metastasis..  CT head scan showed no acute intracranial findings. Random cortisol was 20.0.  Sodium level 135.Marland Kitchen   Blood cultures were negative during hospitalization.Marland Kitchen  COVID-19 and flu was negative. Urine drug screen was negative. Ammonia was 24. Liver function test within normal limits. Urinalysis was mildly abnormal, urine culture with insignificant colonies of Enterococcus.  Patient initially received  antibiotic for the same.  CRP was mildly elevated.  Vitamin B12 -5000.  Sed rate mild elevated at 60.Patient is overall comfortable on comfort care.   Volume depletion. improved with IV fluid hydration.  Continue oral intake at this time.  Enterococcus urine tract infection   No leukocytosis or fever but lactate was elevated. Responded to IV fluids.  Urine culture with insignificant  colonies of Enterococcus.  Patient initially was on Rocephin which was changed to penicillin.  At this time, antibiotic has been discontinued for comfort care.    Recent embolic CVA Was on aspirin and Plavix.   CT head scan was negative for bleeding.  Seen by neurology as well.  At this time patient has been transitioned to comfort care.  Folic acid deficiency.  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  Seen by oncology during hospitalization.  At the request of the family, patient has been transitioned to comfort care.  Debility, deconditioning, persistent encephalopathy.   Continue supportive care  DVT prophylaxis: None for comfort  Code Status: DNR  Family Communication:  patient's husband at bedside.  Status is: Inpatient  The patient is  inpatient because: Transition to comfort care.  She will likely need hospice level of care for continuation.  Transition of care has been consulted.  Dispo: The patient is from: Home              Anticipated d/c is to: Hospice transition of care has been consulted.              Anticipated d/c date is: Undetermined at this time.               Patient currently is not medically stable to d/c.  Consultants: Oncology  Neurology Palliative care  Procedures:  None  Antibiotics:  . Rocephin IV 10/20>10/22 . Ampicillin 04/29/2020>10/23 .  Subjective: Today, patient was seen at bedside.  Appears to be more comfortable alert awake and communicative.  Denies overt pain nausea or vomiting  Objective: Vitals:   05/02/20 0109 05/02/20 0744  BP: (!) 118/47 (!) 110/54  Pulse: (!) 53 85  Resp: 19 18  Temp: 98.1 F (36.7 C) 98 F (36.7 C)  SpO2: 96% 97%   No intake or output data in the 24 hours ending 05/02/20 0821 Filed Weights   04/29/20 0900  Weight: 70.1 kg   Body mass index is 28.28 kg/m.   Physical Exam: General: Thinly built, alert awake and communicative but disoriented and confused. HENT:   Mild pallor noted, no icterus noted. Oral mucosa is moist.  Appears to be comfortable.  Data Review: I have personally reviewed the following laboratory data and studies,  CBC: Recent Labs  Lab 04/26/20 1115 04/26/20 1115 04/27/20 0923 04/27/20 1315 04/28/20 0450 04/29/20 0415 04/30/20 0440  WBC 11.1*   < > 12.7* 10.3 8.9 8.5 7.3  NEUTROABS 8.4*  --  10.0*  --   --   --   --   HGB 9.5*   < > 9.0* 8.6* 8.2* 7.9* 7.7*  HCT 31.6*   < > 29.5* 27.6* 27.3* 26.3* 25.7*  MCV 83.6   < > 83.1 81.9 82.7 84.8 85.1  PLT 231   < > 249 220 216 203 203   < > = values in this interval not displayed.   Basic Metabolic Panel: Recent Labs  Lab 04/26/20 1115 04/27/20 1315 04/28/20 0450 04/29/20 0415 04/30/20 0440  NA 126* 128* 132* 135 137  K 4.6 4.8 4.6 4.4 4.1  CL 98 101 103 108 111  CO2 19* 17* 19* 16* 16*  GLUCOSE 98 90 81 74 115*  BUN 18 19 17 15 15   CREATININE 0.90 0.78 0.71 0.76 0.78  CALCIUM 7.2* 7.4* 7.5* 7.6* 7.6*  MG  --   --   --  2.1 2.1  PHOS  --   --   --  2.8  --    Liver Function Tests: Recent Labs  Lab 04/26/20 1115 04/27/20 1315  AST 40 38  ALT 14 14  ALKPHOS 91 88  BILITOT 0.6 0.8    PROT 6.6 6.5  ALBUMIN 2.0* 2.1*   No results for input(s): LIPASE, AMYLASE in the last 168 hours. Recent Labs  Lab 04/27/20 1329  AMMONIA 24   Cardiac Enzymes: No results for input(s): CKTOTAL, CKMB, CKMBINDEX, TROPONINI in the last 168 hours. BNP (last 3 results) Recent Labs    04/27/20 1844  BNP 76.6    ProBNP (last 3 results) No results for input(s): PROBNP in the last 8760 hours.  CBG: No results for input(s): GLUCAP in the last 168 hours. Recent Results (from the past 240 hour(s))  Urine Culture     Status: Abnormal   Collection Time: 04/27/20  9:23 AM   Specimen: Urine, Random  Result Value Ref Range Status   Specimen Description   Final    URINE, RANDOM Performed at [facility], [address] [place], [place] [address]    Special Requests   Final    NONE Performed at [facility], [address] [place], [place] [address]    Culture 20,000 COLONIES/mL ENTEROCOCCUS FAECALIS (A)  Final   Report Status 04/29/2020 FINAL  Final   Organism ID, Bacteria ENTEROCOCCUS FAECALIS (A)  Final      Susceptibility   Enterococcus faecalis - MIC*    AMPICILLIN <=2 SENSITIVE Sensitive     NITROFURANTOIN <=16 SENSITIVE Sensitive     VANCOMYCIN 1 SENSITIVE Sensitive     * 20,000 COLONIES/mL ENTEROCOCCUS FAECALIS  Blood culture (routine x 2)     Status: None   Collection Time: 04/27/20  6:44 PM   Specimen: BLOOD  Result Value Ref Range Status   Specimen Description BLOOD PORTA CATH  Final   Special Requests   Final    BOTTLES DRAWN AEROBIC AND ANAEROBIC Blood Culture results may not be optimal due to an inadequate volume of blood received in culture bottles   Culture   Final    NO GROWTH 5 DAYS Performed at [facility], [address] [place], [place] [address]    Report Status 05/02/2020 FINAL  Final  Blood culture (routine x 2)     Status: None   Collection Time: 04/27/20  6:44 PM   Specimen: BLOOD  Result Value Ref Range Status    Specimen Description BLOOD RIGHT ANTECUBITAL  Final   Special Requests   Final    BOTTLES DRAWN AEROBIC AND ANAEROBIC Blood Culture results may not be optimal due to an inadequate volume of blood received in culture bottles   Culture   Final    NO GROWTH 5 DAYS Performed at [facility], [address] [place], [place] [address]    Report Status 05/02/2020 FINAL  Final  Respiratory Panel by RT PCR (Flu A&B, Covid) - Nasopharyngeal Swab     Status: None   Collection Time: 04/27/20  6:57 PM   Specimen: Nasopharyngeal Swab  Result Value Ref Range Status   SARS Coronavirus 2 by RT PCR NEGATIVE NEGATIVE Final    Comment: (NOTE) SARS-CoV-2 target nucleic acids are NOT DETECTED.  The SARS-CoV-2 RNA is generally detectable in upper respiratoy specimens during the acute phase of infection. The lowest concentration of SARS-CoV-2 viral copies this assay can detect is 131 copies/mL. A negative result does not preclude SARS-Cov-2 infection and should not be used as the sole basis for treatment or other patient management decisions. A negative result may occur with  improper specimen collection/handling, submission of specimen other than nasopharyngeal swab, presence of viral mutation(s) within the areas targeted by this assay, and inadequate number of viral copies (<131 copies/mL). A negative result must be combined with clinical observations, patient history, and epidemiological information. The expected result is Negative.  Fact Sheet for Patients:  PinkCheek.be  Fact Sheet for Healthcare Providers:  GravelBags.it  This test is no t yet approved or cleared by the [place] FDA and  has been authorized for detection and/or diagnosis of SARS-CoV-2 by FDA under an Emergency Use Authorization (EUA). This EUA will remain  in effect (meaning this test can be used) for the duration of the COVID-19 declaration under Section  564(b)(1) of the Act, 21 U.S.C. section 360bbb-3(b)(1), unless the authorization is terminated or revoked sooner.  Influenza A by PCR NEGATIVE NEGATIVE Final   Influenza B by PCR NEGATIVE NEGATIVE Final    Comment: (NOTE) The Xpert Xpress SARS-CoV-2/FLU/RSV assay is intended as an aid in  the diagnosis of influenza from Nasopharyngeal swab specimens and  should not be used as a sole basis for treatment. Nasal washings and  aspirates are unacceptable for Xpert Xpress SARS-CoV-2/FLU/RSV  testing.  Fact Sheet for Patients: PinkCheek.be  Fact Sheet for Healthcare Providers: GravelBags.it  This test is not yet approved or cleared by the [place] FDA and  has been authorized for detection and/or diagnosis of SARS-CoV-2 by  FDA under an Emergency Use Authorization (EUA). This EUA will remain  in effect (meaning this test can be used) for the duration of the  Covid-19 declaration under Section 564(b)(1) of the Act, 21  U.S.C. section 360bbb-3(b)(1), unless the authorization is  terminated or revoked. Performed at [facility], [address], [place], [place] [address]      Studies: No results found.    Flora Lipps, MD  [facility] 05/02/2020

## 2020-05-03 ENCOUNTER — Telehealth: Payer: Self-pay | Admitting: Hematology and Oncology

## 2020-05-03 ENCOUNTER — Inpatient Hospital Stay: Payer: Medicare Other | Admitting: Hematology and Oncology

## 2020-05-03 ENCOUNTER — Ambulatory Visit: Payer: Medicare Other

## 2020-05-03 ENCOUNTER — Other Ambulatory Visit: Payer: Medicare Other

## 2020-05-03 DIAGNOSIS — N390 Urinary tract infection, site not specified: Secondary | ICD-10-CM | POA: Diagnosis not present

## 2020-05-03 DIAGNOSIS — C649 Malignant neoplasm of unspecified kidney, except renal pelvis: Secondary | ICD-10-CM | POA: Diagnosis not present

## 2020-05-03 DIAGNOSIS — I10 Essential (primary) hypertension: Secondary | ICD-10-CM | POA: Diagnosis not present

## 2020-05-03 DIAGNOSIS — R443 Hallucinations, unspecified: Secondary | ICD-10-CM | POA: Diagnosis not present

## 2020-05-03 MED ORDER — LORAZEPAM 2 MG/ML IJ SOLN: 1.0000 mg | INTRAMUSCULAR | 0 refills | Status: AC | PRN

## 2020-05-03 MED ORDER — MORPHINE SULFATE (PF) 2 MG/ML IV SOLN: 2.0000 mg | INTRAVENOUS | Status: AC | PRN

## 2020-05-03 MED ORDER — ALBUTEROL SULFATE (2.5 MG/3ML) 0.083% IN NEBU: 2.5000 mg | INHALATION_SOLUTION | Freq: Four times a day (QID) | RESPIRATORY_TRACT | Status: AC | PRN

## 2020-05-03 MED ORDER — HALOPERIDOL 5 MG PO TABS: 5.0000 mg | ORAL_TABLET | Freq: Four times a day (QID) | ORAL | Status: AC | PRN

## 2020-05-03 NOTE — Plan of Care (Signed)

## 2020-05-03 NOTE — Discharge Summary (Addendum)
Physician Discharge Summary  Mariah Thornton:[id number] DOB: 05/06/1946 DOA: 04/27/2020  PCP: Philmore Pali, NP  Admit date: 04/27/2020 Discharge date: 05/03/2020  Admitted From: Home  Discharge disposition: hospice facility   Recommendations for Outpatient Follow-Up:   . Follow up with your healthcare provider at the hospice facility.  Discharge Diagnosis:   Principal Problem:   Hallucinations Active Problems:   Bone metastases (HCC)   Hypertension   UTI (urinary tract infection)   Metastatic renal cell carcinoma (HCC)   Hypotension   Metabolic encephalopathy   Altered mental status   Discharge Condition: Stable for hospice transfer  Diet recommendation:  Regular.  Wound care: None.  Code status: DNR   History of Present Illness:   Mariah Thornton a 74 y.o.femalewith medical history significant forstage IV renal cell carcinoma (followed by Dr. Mike Gip) s/p1 cycle of Keytruda on 04/07/2020 and [place] well as history of HTN, CVA, chronic anemia,recently hospitalized from 10/13-10/16 with excessive somnolence, multifactorial etiology including small infarctsseen on MRI brain, pain medications and dehydration from poor oral intake who was sent by the oncology clinic for admission due to complaints of hallucinationsconsisting of seeing animals around her.Patient denied headache, visual disturbance or one-sided weakness numbness or tingling.On arrival to the ED, vitals were within normal limits with somewhat soft blood pressure of 117/51. Blood work mostly notable for hemoglobin of 8.6, which is about her baseline, normal WBC of 10.3 but with elevated lactic acid of 2.2, sodium 128. Urinalysis showed moderate leukocyte esterase. EKG showed normal sinus rhythm with no acute ST-T wave changes. Patient was started on IV fluids, Rocephin. Hospitalist consulted for admission for possible sepsis secondary to UTI with hallucinations and  agitation.  Hospital Course:   Following conditions were addressed during hospitalization as listed below,  Hallucinations, metabolic encephalopathy with delirium MRI of the head which showed multiple areas of infarct as before plus new 3 mm lesion in the temporal lobe, which could be a vascular finding versus metastasis..  CT head scan showed no acute intracranial findings. Random cortisol was 20.0.  Sodium level 135.Marland Kitchen  Blood cultures were negative during hospitalization.Marland Kitchen  COVID-19 and flu was negative. Urine drug screen was negative. Ammonia was 24. Liver function test within normal limits. Urinalysis was mildly abnormal, urine culture with insignificant colonies of Enterococcus.  Patient initially received  antibiotic for the same.  CRP was mildly elevated.  Vitamin B12 -5000.  Sed rate mild elevated at 60.patient was seen by palliative care, oncology and neurology during hospitalization.  At this time plan has been comfort care.   Volume depletion. improved with IV fluid hydration.  Continue oral intake as possible.  Enterococcus urine tract infection  with severe sepsis. Present on admission. Had hypotension, elevated RR and lactate with UTI.  Initially received IV fluids.Marland Kitchen Responded to IV fluids.  Received antibiotics initially.  Currently on comfort care.  Recent embolic CVA Was on aspirin and Plavix.   CT head scan was negative for bleeding.  MRI showed multiple infarcts like before.  Currently on comfort care.  Folic acid deficiency.  Metastatic renal cell carcinoma (HCC)to bone and lung Patient presented with confusion, disorientation agitation and hallucination    Patient has [facility].  Currently on hospice care  Debility, deconditioning, persistent encephalopathy.  Continue supportive care  Disposition.  At this time, patient is stable for disposition to hospice facility today.  Spoke with the patient's husband at bedside and hospice  representative  Medical Consultants:  Palliative care  Neurology  Oncology  Procedures:    None Subjective:   Today, patient was seen and examined at bedside.  Denies any increasing shortness of breath pain nausea vomiting.  Discharge Exam:   Vitals:   05/02/20 2003 05/03/20 0746  BP: (!) 137/41 (!) 111/52  Pulse: 89 88  Resp: 18   Temp: (!) 97.5 F (36.4 C)   SpO2: 95% 96%   Vitals:   05/02/20 0109 05/02/20 0744 05/02/20 2003 05/03/20 0746  BP: (!) 118/47 (!) 110/54 (!) 137/41 (!) 111/52  Pulse: (!) 53 85 89 88  Resp: 19 18 18    Temp: 98.1 F (36.7 C) 98 F (36.7 C) (!) 97.5 F (36.4 C)   TempSrc:  Oral    SpO2: 96% 97% 95% 96%  Weight:      Height:        General: Alert awake, not in obvious distress, on supplemental oxygen HENT: pupils equally reacting to light,  No scleral pallor or icterus noted. Oral mucosa is moist.  Chest: Coarse breath sounds noted. No crackles or wheezes.  Right chest wall Port-A-Cath in place CVS: S1 &S2 heard. No murmur.  Regular rate and rhythm. Abdomen: Soft, nontender, nondistended.  Bowel sounds are heard.   Extremities: No cyanosis, clubbing or edema.  Peripheral pulses are palpable. Psych: Alert, awake and communicative but confused and disoriented  CNS:  No cranial nerve deficits.  Power equal in all extremities.   Skin: Warm and dry.  No rashes noted.  The results of significant diagnostics from this hospitalization (including imaging, microbiology, ancillary and laboratory) are listed below for reference.     Diagnostic Studies:   DG Chest 2 View  Result Date: 04/27/2020 CLINICAL DATA:  Altered mental status EXAM: CHEST - 2 VIEW COMPARISON:  04/20/2020, 03/03/2020 FINDINGS: Right-sided Port-A-Cath remains in place. Stable heart size. Atherosclerotic calcification of the aortic knob. Scattered nodular densities most prevalent within the left lung base, better characterized on recent CT. Small bilateral pleural  effusions with associated streaky bibasilar opacities, likely atelectasis. No pneumothorax. IMPRESSION: Small bilateral pleural effusions with associated streaky bibasilar opacities, likely atelectasis. Electronically Signed   By: Davina Poke D.O.   On: 04/27/2020 16:49   CT Head Wo Contrast  Result Date: 04/27/2020 CLINICAL DATA:  Mental status change, unknown cause. EXAM: CT HEAD WITHOUT CONTRAST TECHNIQUE: Contiguous axial images were obtained from the base of the skull through the vertex without intravenous contrast. COMPARISON:  MRI 04/21/2020, CT head 04/20/2020. FINDINGS: Brain: No evidence of acute large vascular territory infarction, hemorrhage, hydrocephalus, extra-axial collection or mass lesion/mass effect. Patchy white matter hypoattenuation appears similar to prior and most likely represents chronic microvascular ischemic disease. Vascular: Calcific atherosclerosis. Skull: Similar left frontal scalp lesion near the vertex and smaller nodule at the midline anteriorly. No acute fracture. Sinuses/Orbits: Clear sinuses.  Unremarkable orbits. Other: No mastoid effusions. IMPRESSION: 1. No evidence of acute intracranial abnormality. 2. 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 3. Similar left frontal scalp lesion near the vertex and smaller nodule at the midline anteriorly. Electronically Signed   By: Margaretha Sheffield MD   On: 04/27/2020 13:59     Labs:   Basic Metabolic Panel: Recent Labs  Lab 04/26/20 1115 04/26/20 1115 04/27/20 1315 04/27/20 1315 04/28/20 0450 04/28/20 0450 04/29/20 0415 04/30/20 0440  NA 126*  --  128*  --  132*  --  135 137  K 4.6   < > 4.8   < > 4.6   < >  4.4 4.1  CL 98  --  101  --  103  --  108 111  CO2 19*  --  17*  --  19*  --  16* 16*  GLUCOSE 98  --  90  --  81  --  74 115*  BUN 18  --  19  --  17  --  15 15  CREATININE 0.90  --  0.78  --  0.71  --  0.76 0.78  CALCIUM 7.2*  --  7.4*  --  7.5*  --  7.6* 7.6*  MG  --   --    --   --   --   --  2.1 2.1  PHOS  --   --   --   --   --   --  2.8  --    < > = values in this interval not displayed.   GFR Estimated Creatinine Clearance: 55.7 mL/min (by C-G formula based on SCr of 0.78 mg/dL). Liver Function Tests: Recent Labs  Lab 04/26/20 1115 04/27/20 1315  AST 40 38  ALT 14 14  ALKPHOS 91 88  BILITOT 0.6 0.8  PROT 6.6 6.5  ALBUMIN 2.0* 2.1*   No results for input(s): LIPASE, AMYLASE in the last 168 hours. Recent Labs  Lab 04/27/20 1329  AMMONIA 24   Coagulation profile Recent Labs  Lab 04/27/20 1315 04/28/20 0450  INR 1.4* 1.5*    CBC: Recent Labs  Lab 04/26/20 1115 04/26/20 1115 04/27/20 0923 04/27/20 1315 04/28/20 0450 04/29/20 0415 04/30/20 0440  WBC 11.1*   < > 12.7* 10.3 8.9 8.5 7.3  NEUTROABS 8.4*  --  10.0*  --   --   --   --   HGB 9.5*   < > 9.0* 8.6* 8.2* 7.9* 7.7*  HCT 31.6*   < > 29.5* 27.6* 27.3* 26.3* 25.7*  MCV 83.6   < > 83.1 81.9 82.7 84.8 85.1  PLT 231   < > 249 220 216 203 203   < > = values in this interval not displayed.   Cardiac Enzymes: No results for input(s): CKTOTAL, CKMB, CKMBINDEX, TROPONINI in the last 168 hours. BNP: Invalid input(s): POCBNP CBG: No results for input(s): GLUCAP in the last 168 hours. D-Dimer No results for input(s): DDIMER in the last 72 hours. Hgb A1c No results for input(s): HGBA1C in the last 72 hours. Lipid Profile No results for input(s): CHOL, HDL, LDLCALC, TRIG, CHOLHDL, LDLDIRECT in the last 72 hours. Thyroid function studies No results for input(s): TSH, T4TOTAL, T3FREE, THYROIDAB in the last 72 hours.  Invalid input(s): FREET3 Anemia work up No results for input(s): VITAMINB12, FOLATE, FERRITIN, TIBC, IRON, RETICCTPCT in the last 72 hours. Microbiology Recent Results (from the past 240 hour(s))  Urine Culture     Status: Abnormal   Collection Time: 04/27/20  9:23 AM   Specimen: Urine, Random  Result Value Ref Range Status   Specimen Description   Final    URINE,  RANDOM Performed at [facility], [address], [place], [place] [address]    Special Requests   Final    NONE Performed at [facility], [address] [place], [place] [address]    Culture 20,000 COLONIES/mL ENTEROCOCCUS FAECALIS (A)  Final   Report Status 04/29/2020 FINAL  Final   Organism ID, Bacteria ENTEROCOCCUS FAECALIS (A)  Final      Susceptibility   Enterococcus faecalis - MIC*    AMPICILLIN <=2 SENSITIVE  Sensitive     NITROFURANTOIN <=16 SENSITIVE Sensitive     VANCOMYCIN 1 SENSITIVE Sensitive     * 20,000 COLONIES/mL ENTEROCOCCUS FAECALIS  Blood culture (routine x 2)     Status: None   Collection Time: 04/27/20  6:44 PM   Specimen: BLOOD  Result Value Ref Range Status   Specimen Description BLOOD PORTA CATH  Final   Special Requests   Final    BOTTLES DRAWN AEROBIC AND ANAEROBIC Blood Culture results may not be optimal due to an inadequate volume of blood received in culture bottles   Culture   Final    NO GROWTH 5 DAYS Performed at [facility], [address] [place], [place] [address]    Report Status 05/02/2020 FINAL  Final  Blood culture (routine x 2)     Status: None   Collection Time: 04/27/20  6:44 PM   Specimen: BLOOD  Result Value Ref Range Status   Specimen Description BLOOD RIGHT ANTECUBITAL  Final   Special Requests   Final    BOTTLES DRAWN AEROBIC AND ANAEROBIC Blood Culture results may not be optimal due to an inadequate volume of blood received in culture bottles   Culture   Final    NO GROWTH 5 DAYS Performed at [facility], [address] [place], [place] [address]    Report Status 05/02/2020 FINAL  Final  Respiratory Panel by RT PCR (Flu A&B, Covid) - Nasopharyngeal Swab     Status: None   Collection Time: 04/27/20  6:57 PM   Specimen: Nasopharyngeal Swab  Result Value Ref Range Status   SARS Coronavirus 2 by RT PCR NEGATIVE NEGATIVE Final    Comment: (NOTE) SARS-CoV-2 target nucleic acids are  NOT DETECTED.  The SARS-CoV-2 RNA is generally detectable in upper respiratoy specimens during the acute phase of infection. The lowest concentration of SARS-CoV-2 viral copies this assay can detect is 131 copies/mL. A negative result does not preclude SARS-Cov-2 infection and should not be used as the sole basis for treatment or other patient management decisions. A negative result may occur with  improper specimen collection/handling, submission of specimen other than nasopharyngeal swab, presence of viral mutation(s) within the areas targeted by this assay, and inadequate number of viral copies (<131 copies/mL). A negative result must be combined with clinical observations, patient history, and epidemiological information. The expected result is Negative.  Fact Sheet for Patients:  PinkCheek.be  Fact Sheet for Healthcare Providers:  GravelBags.it  This test is no t yet approved or cleared by the [place] FDA and  has been authorized for detection and/or diagnosis of SARS-CoV-2 by FDA under an Emergency Use Authorization (EUA). This EUA will remain  in effect (meaning this test can be used) for the duration of the COVID-19 declaration under Section 564(b)(1) of the Act, 21 U.S.C. section 360bbb-3(b)(1), unless the authorization is terminated or revoked sooner.     Influenza A by PCR NEGATIVE NEGATIVE Final   Influenza B by PCR NEGATIVE NEGATIVE Final    Comment: (NOTE) The Xpert Xpress SARS-CoV-2/FLU/RSV assay is intended as an aid in  the diagnosis of influenza from Nasopharyngeal swab specimens and  should not be used as a sole basis for treatment. Nasal washings and  aspirates are unacceptable for Xpert Xpress SARS-CoV-2/FLU/RSV  testing.  Fact Sheet for Patients: PinkCheek.be  Fact Sheet for Healthcare Providers: GravelBags.it  This test is not yet  approved or cleared by the [place] and  has been authorized for  detection and/or diagnosis of SARS-CoV-2 by  FDA under an Emergency Use Authorization (EUA). This EUA will remain  in effect (meaning this test can be used) for the duration of the  Covid-19 declaration under Section 564(b)(1) of the Act, 21  U.S.C. section 360bbb-3(b)(1), unless the authorization is  terminated or revoked. Performed at [facility], [address] [place], [place] [address]      Discharge Instructions:   Discharge Instructions    Diet general   Complete by: As directed    Discharge instructions   Complete by: As directed    As per hospice provider     Allergies as of 05/03/2020   No Known Allergies     Medication List    STOP taking these medications   amLODipine 10 MG tablet Commonly known as: NORVASC   aspirin 81 MG EC tablet   axitinib 5 MG tablet Commonly known as: INLYTA   clopidogrel 75 MG tablet Commonly known as: PLAVIX   folic acid 1 MG tablet Commonly known as: FOLVITE     TAKE these medications   acetaminophen 325 MG tablet Commonly known as: TYLENOL Take 2 tablets (650 mg total) by mouth every 6 (six) hours as needed for mild pain (or Fever >/= 101).   albuterol (2.5 MG/3ML) 0.083% nebulizer solution Commonly known as: PROVENTIL Take 3 mLs (2.5 mg total) by nebulization every 6 (six) hours as needed for wheezing or shortness of breath.   haloperidol 5 MG tablet Commonly known as: HALDOL Take 1 tablet (5 mg total) by mouth every 6 (six) hours as needed for agitation.   HYDROcodone-acetaminophen 5-325 MG tablet Commonly known as: NORCO/VICODIN Take 1/2 or 1 pill every 6 hours as needed for pain.   lidocaine-prilocaine cream Commonly known as: EMLA Apply to affected area once What changed:   how much to take  how to take this  when to take this  reasons to take this   LORazepam 2 MG/ML injection Commonly known as: ATIVAN Inject  0.5 mLs (1 mg total) into the vein every 4 (four) hours as needed for anxiety.   morphine 2 MG/ML injection Inject 1 mL (2 mg total) into the vein every 2 (two) hours as needed (airhunger).   ondansetron 8 MG tablet Commonly known as: Zofran Take 1 tablet (8 mg total) by mouth 2 (two) times daily as needed (Nausea or vomiting).         Time coordinating discharge: 39 minutes  Signed:  Felise Georgia  [facility] 05/03/2020, 10:10 AM

## 2020-05-03 NOTE — Telephone Encounter (Signed)
Patient was moved to hospice today. Cancel all appointments.

## 2020-05-03 NOTE — TOC Transition Note (Addendum)
Transition of Care [facility]) - CM/SW Discharge Note   Patient Details  Name: Mariah Thornton MRN: [id number] Date of Birth: 05/06/1946  Transition of [facility]) CM/SW Contact:  Magnus Ivan, LCSW Phone Number: 05/03/2020, 10:53 AM   Clinical Narrative:   Patient to discharge to [place] today. EMS transport arranged through [facility] for 5:30 pm today per [facility] Representative Karen's request. Updated caregiver Levada Dy via voicemail and encouraged her to call with any questions or needs. Updated RN. EMS paperwork, face sheet, and DNR in discharge packet.   2:20- Mickel Duhamel with [facility] to inform him patient is on 3L oxygen. He reported this is fine, they have oxygen on all of their trucks.     Final next level of care: [place] Barriers to Discharge: Barriers Resolved   Patient Goals and CMS Choice Patient states their goals for this hospitalization and ongoing recovery are:: hospice home, per family CMS Medicare.gov Compare Post Acute Care list provided to:: Patient Represenative (must comment) Choice offered to / list presented to : Spouse (family friend/caregiver Levada Dy)  Discharge Placement                Patient to be transferred to facility by: [facility] EMS Transport Name of family member notified: Levada Dy Patient and family notified of of transfer: 05/03/20  Discharge Plan and Services In-house Referral: Clinical Social Work   Post Acute Care Choice: [facility]                               Social Determinants of Health (SDOH) Interventions     Readmission Risk Interventions Readmission Risk Prevention Plan 04/29/2020  Transportation Screening Complete  Medication Review [employer]) Complete  PCP or Specialist appointment within 3-5 days of discharge Complete  HRI or Home Care Consult Complete  SW Recovery Care/Counseling Consult Complete  Skilled [place] Complete  Some recent  data might be hidden

## 2020-05-03 NOTE — Progress Notes (Signed)
[facility] hospital liaison note:  Follow up visit made to new referral for [facility] hospice home. Hospital care team and family made aware that a bed is available for transfer today. All remain agreeable. TOC Meagan Hagwood to set up a 5:30 pm pick up with [facility]. Report has been called to the hospice home. Will continue to follow through final discharge.  Flo Shanks BSN, RN, [place] [phone]

## 2020-05-03 NOTE — Care Management Important Message (Signed)
Important Message  Patient Details  Name: Mariah Thornton MRN: [id number] Date of Birth: Dec 12, 1945   Medicare Important Message Given:  Other (see comment)  Patient on Comfort measures and awaiting a Hospice Home bed. Out of respect for the patient and family no Important Message given.  Juliann Pulse A Tayte Childers 05/03/2020, 8:28 AM

## 2020-05-03 NOTE — Progress Notes (Signed)
Pt d/c with implanted port to (R) chest wall accessed.

## 2020-05-05 LAB — VITAMIN B1: Vitamin B1 (Thiamine): 91.3 nmol/L (ref 66.5–200.0)

## 2020-05-06 ENCOUNTER — Other Ambulatory Visit: Payer: Medicare Other

## 2020-05-06 ENCOUNTER — Ambulatory Visit: Payer: Medicare Other

## 2020-05-06 ENCOUNTER — Ambulatory Visit: Payer: Medicare Other | Admitting: Hematology and Oncology

## 2020-05-08 DIAGNOSIS — E871 Hypo-osmolality and hyponatremia: Secondary | ICD-10-CM | POA: Insufficient documentation

## 2020-06-08 DEATH — deceased
